# Patient Record
Sex: Female | Born: 1955 | Race: White | Hispanic: No | Marital: Married | Smoking: Former smoker
Health system: Southern US, Community
[De-identification: ages and names within clinical notes are randomized; demographics above are authoritative.]

## PROBLEM LIST (undated history)

## (undated) DIAGNOSIS — M810 Age-related osteoporosis without current pathological fracture: Secondary | ICD-10-CM

## (undated) DIAGNOSIS — M1990 Unspecified osteoarthritis, unspecified site: Secondary | ICD-10-CM

## (undated) DIAGNOSIS — J449 Chronic obstructive pulmonary disease, unspecified: Secondary | ICD-10-CM

## (undated) DIAGNOSIS — E785 Hyperlipidemia, unspecified: Secondary | ICD-10-CM

## (undated) DIAGNOSIS — F17200 Nicotine dependence, unspecified, uncomplicated: Secondary | ICD-10-CM

## (undated) DIAGNOSIS — I10 Essential (primary) hypertension: Secondary | ICD-10-CM

## (undated) DIAGNOSIS — K635 Polyp of colon: Secondary | ICD-10-CM

## (undated) DIAGNOSIS — E538 Deficiency of other specified B group vitamins: Secondary | ICD-10-CM

## (undated) DIAGNOSIS — T7840XA Allergy, unspecified, initial encounter: Secondary | ICD-10-CM

## (undated) DIAGNOSIS — Z8489 Family history of other specified conditions: Secondary | ICD-10-CM

## (undated) DIAGNOSIS — J189 Pneumonia, unspecified organism: Secondary | ICD-10-CM

## (undated) DIAGNOSIS — H269 Unspecified cataract: Secondary | ICD-10-CM

## (undated) DIAGNOSIS — I499 Cardiac arrhythmia, unspecified: Secondary | ICD-10-CM

## (undated) HISTORY — DX: Age-related osteoporosis without current pathological fracture: M81.0

## (undated) HISTORY — DX: Deficiency of other specified B group vitamins: E53.8

## (undated) HISTORY — DX: Nicotine dependence, unspecified, uncomplicated: F17.200

## (undated) HISTORY — DX: Unspecified cataract: H26.9

## (undated) HISTORY — PX: JOINT REPLACEMENT: SHX530

## (undated) HISTORY — DX: Chronic obstructive pulmonary disease, unspecified: J44.9

## (undated) HISTORY — DX: Allergy, unspecified, initial encounter: T78.40XA

## (undated) HISTORY — PX: COLONOSCOPY: SHX174

## (undated) HISTORY — PX: CARDIAC CATHETERIZATION: SHX172

## (undated) HISTORY — DX: Hyperlipidemia, unspecified: E78.5

## (undated) HISTORY — DX: Polyp of colon: K63.5

## (undated) HISTORY — PX: POLYPECTOMY: SHX149

## (undated) HISTORY — PX: EYE SURGERY: SHX253

## (undated) HISTORY — DX: Essential (primary) hypertension: I10

---

## 1998-02-16 ENCOUNTER — Ambulatory Visit (HOSPITAL_COMMUNITY): Admission: RE | Admit: 1998-02-16 | Discharge: 1998-02-16 | Payer: Self-pay | Admitting: *Deleted

## 1998-02-18 ENCOUNTER — Ambulatory Visit (HOSPITAL_COMMUNITY): Admission: RE | Admit: 1998-02-18 | Discharge: 1998-02-18 | Payer: Self-pay | Admitting: *Deleted

## 1998-02-18 ENCOUNTER — Encounter: Payer: Self-pay | Admitting: *Deleted

## 1999-02-21 ENCOUNTER — Ambulatory Visit (HOSPITAL_COMMUNITY): Admission: RE | Admit: 1999-02-21 | Discharge: 1999-02-21 | Payer: Self-pay | Admitting: *Deleted

## 1999-02-21 ENCOUNTER — Encounter: Payer: Self-pay | Admitting: *Deleted

## 2000-06-04 ENCOUNTER — Ambulatory Visit (HOSPITAL_COMMUNITY): Admission: RE | Admit: 2000-06-04 | Discharge: 2000-06-04 | Payer: Self-pay | Admitting: *Deleted

## 2000-06-04 ENCOUNTER — Encounter: Payer: Self-pay | Admitting: *Deleted

## 2001-03-27 ENCOUNTER — Observation Stay (HOSPITAL_COMMUNITY): Admission: AD | Admit: 2001-03-27 | Discharge: 2001-03-28 | Payer: Self-pay | Admitting: Cardiology

## 2001-09-04 ENCOUNTER — Ambulatory Visit (HOSPITAL_COMMUNITY): Admission: RE | Admit: 2001-09-04 | Discharge: 2001-09-04 | Payer: Self-pay | Admitting: *Deleted

## 2001-09-04 ENCOUNTER — Encounter: Payer: Self-pay | Admitting: *Deleted

## 2002-09-07 ENCOUNTER — Encounter: Payer: Self-pay | Admitting: *Deleted

## 2002-09-07 ENCOUNTER — Ambulatory Visit (HOSPITAL_COMMUNITY): Admission: RE | Admit: 2002-09-07 | Discharge: 2002-09-07 | Payer: Self-pay | Admitting: *Deleted

## 2003-10-12 ENCOUNTER — Ambulatory Visit (HOSPITAL_COMMUNITY): Admission: RE | Admit: 2003-10-12 | Discharge: 2003-10-12 | Payer: Self-pay | Admitting: Obstetrics and Gynecology

## 2004-09-04 ENCOUNTER — Ambulatory Visit: Payer: Self-pay | Admitting: Internal Medicine

## 2004-10-06 ENCOUNTER — Ambulatory Visit: Payer: Self-pay | Admitting: Internal Medicine

## 2004-10-12 ENCOUNTER — Ambulatory Visit (HOSPITAL_COMMUNITY): Admission: RE | Admit: 2004-10-12 | Discharge: 2004-10-12 | Payer: Self-pay | Admitting: Obstetrics and Gynecology

## 2004-10-18 ENCOUNTER — Ambulatory Visit: Payer: Self-pay | Admitting: Internal Medicine

## 2005-02-08 ENCOUNTER — Ambulatory Visit: Payer: Self-pay | Admitting: Internal Medicine

## 2005-03-20 ENCOUNTER — Ambulatory Visit: Payer: Self-pay | Admitting: Internal Medicine

## 2005-03-27 ENCOUNTER — Ambulatory Visit: Payer: Self-pay | Admitting: Internal Medicine

## 2006-04-23 ENCOUNTER — Ambulatory Visit: Payer: Self-pay | Admitting: Internal Medicine

## 2006-08-22 ENCOUNTER — Ambulatory Visit: Payer: Self-pay | Admitting: Internal Medicine

## 2006-08-22 LAB — CONVERTED CEMR LAB
BUN: 8 mg/dL (ref 6–23)
Basophils Absolute: 0.2 10*3/uL — ABNORMAL HIGH (ref 0.0–0.1)
Basophils Relative: 1.4 % — ABNORMAL HIGH (ref 0.0–1.0)
Bilirubin Urine: NEGATIVE
CO2: 31 meq/L (ref 19–32)
Calcium: 9.4 mg/dL (ref 8.4–10.5)
Chloride: 103 meq/L (ref 96–112)
Creatinine, Ser: 0.8 mg/dL (ref 0.4–1.2)
Crystals: NEGATIVE
Eosinophils Absolute: 0.2 10*3/uL (ref 0.0–0.6)
Eosinophils Relative: 2.1 % (ref 0.0–5.0)
GFR calc Af Amer: 98 mL/min
GFR calc non Af Amer: 81 mL/min
Glucose, Bld: 104 mg/dL — ABNORMAL HIGH (ref 70–99)
HCT: 44.8 % (ref 36.0–46.0)
Hemoglobin: 15.4 g/dL — ABNORMAL HIGH (ref 12.0–15.0)
Ketones, ur: NEGATIVE mg/dL
Leukocytes, UA: NEGATIVE
Lymphocytes Relative: 36.7 % (ref 12.0–46.0)
MCHC: 34.4 g/dL (ref 30.0–36.0)
MCV: 93.8 fL (ref 78.0–100.0)
Monocytes Absolute: 0.6 10*3/uL (ref 0.2–0.7)
Monocytes Relative: 5.1 % (ref 3.0–11.0)
Mucus, UA: NEGATIVE
Neutro Abs: 6.5 10*3/uL (ref 1.4–7.7)
Neutrophils Relative %: 54.7 % (ref 43.0–77.0)
Nitrite: NEGATIVE
Platelets: 308 10*3/uL (ref 150–400)
Potassium: 3.9 meq/L (ref 3.5–5.1)
RBC: 4.77 M/uL (ref 3.87–5.11)
RDW: 12.6 % (ref 11.5–14.6)
Sodium: 139 meq/L (ref 135–145)
Specific Gravity, Urine: <=1.005 (ref 1.000–1.03)
Total Protein, Urine: NEGATIVE mg/dL
Urine Glucose: NEGATIVE mg/dL
Urobilinogen, UA: 0.2 (ref 0.0–1.0)
WBC: 11.9 10*3/uL — ABNORMAL HIGH (ref 4.5–10.5)
pH: 6 (ref 5.0–8.0)

## 2006-08-23 ENCOUNTER — Ambulatory Visit: Payer: Self-pay | Admitting: Internal Medicine

## 2006-09-30 ENCOUNTER — Ambulatory Visit: Payer: Self-pay | Admitting: Gastroenterology

## 2006-10-14 ENCOUNTER — Ambulatory Visit: Payer: Self-pay | Admitting: Gastroenterology

## 2006-10-14 ENCOUNTER — Encounter (INDEPENDENT_AMBULATORY_CARE_PROVIDER_SITE_OTHER): Payer: Self-pay | Admitting: Specialist

## 2007-04-19 ENCOUNTER — Encounter: Payer: Self-pay | Admitting: Internal Medicine

## 2007-04-19 DIAGNOSIS — J309 Allergic rhinitis, unspecified: Secondary | ICD-10-CM | POA: Insufficient documentation

## 2007-04-19 DIAGNOSIS — Z860100 Personal history of colon polyps, unspecified: Secondary | ICD-10-CM | POA: Insufficient documentation

## 2007-04-21 ENCOUNTER — Encounter: Payer: Self-pay | Admitting: Internal Medicine

## 2007-04-21 ENCOUNTER — Ambulatory Visit: Payer: Self-pay | Admitting: Internal Medicine

## 2007-04-21 DIAGNOSIS — I10 Essential (primary) hypertension: Secondary | ICD-10-CM | POA: Insufficient documentation

## 2007-04-21 DIAGNOSIS — N924 Excessive bleeding in the premenopausal period: Secondary | ICD-10-CM | POA: Insufficient documentation

## 2007-04-21 DIAGNOSIS — F17200 Nicotine dependence, unspecified, uncomplicated: Secondary | ICD-10-CM | POA: Insufficient documentation

## 2007-04-22 LAB — CONVERTED CEMR LAB
ALT: 20 units/L (ref 0–35)
AST: 18 units/L (ref 0–37)
Albumin: 3.6 g/dL (ref 3.5–5.2)
Alkaline Phosphatase: 77 units/L (ref 39–117)
BUN: 9 mg/dL (ref 6–23)
Basophils Absolute: 0.1 10*3/uL (ref 0.0–0.1)
Basophils Relative: 0.7 % (ref 0.0–1.0)
Bilirubin Urine: NEGATIVE
Bilirubin, Direct: 0.2 mg/dL (ref 0.0–0.3)
CO2: 31 meq/L (ref 19–32)
Calcium: 9 mg/dL (ref 8.4–10.5)
Chloride: 112 meq/L (ref 96–112)
Cholesterol: 209 mg/dL (ref 0–200)
Creatinine, Ser: 0.8 mg/dL (ref 0.4–1.2)
Crystals: NEGATIVE
Direct LDL: 154 mg/dL
Eosinophils Absolute: 0.3 10*3/uL (ref 0.0–0.6)
Eosinophils Relative: 3.5 % (ref 0.0–5.0)
GFR calc Af Amer: 97 mL/min
GFR calc non Af Amer: 80 mL/min
Glucose, Bld: 87 mg/dL (ref 70–99)
HCT: 42.4 % (ref 36.0–46.0)
HDL: 38.4 mg/dL — ABNORMAL LOW (ref >39.0)
Hemoglobin: 14.3 g/dL (ref 12.0–15.0)
Ketones, ur: NEGATIVE mg/dL
Lymphocytes Relative: 32.5 % (ref 12.0–46.0)
MCHC: 33.7 g/dL (ref 30.0–36.0)
MCV: 96.2 fL (ref 78.0–100.0)
Monocytes Absolute: 0.5 10*3/uL (ref 0.2–0.7)
Monocytes Relative: 5.8 % (ref 3.0–11.0)
Neutro Abs: 4.8 10*3/uL (ref 1.4–7.7)
Neutrophils Relative %: 57.5 % (ref 43.0–77.0)
Nitrite: NEGATIVE
Platelets: 215 10*3/uL (ref 150–400)
Potassium: 4.3 meq/L (ref 3.5–5.1)
RBC: 4.41 M/uL (ref 3.87–5.11)
RDW: 12.6 % (ref 11.5–14.6)
Sodium: 149 meq/L — ABNORMAL HIGH (ref 135–145)
Specific Gravity, Urine: 1.015 (ref 1.000–1.03)
TSH: 0.74 microintl units/mL (ref 0.35–5.50)
Total Bilirubin: 0.8 mg/dL (ref 0.3–1.2)
Total CHOL/HDL Ratio: 5.4
Total Protein, Urine: NEGATIVE mg/dL
Total Protein: 6.3 g/dL (ref 6.0–8.3)
Triglycerides: 137 mg/dL (ref 0–149)
Urine Glucose: NEGATIVE mg/dL
Urobilinogen, UA: 0.2 (ref 0.0–1.0)
VLDL: 27 mg/dL (ref 0–40)
WBC: 8.5 10*3/uL (ref 4.5–10.5)
pH: 6.5 (ref 5.0–8.0)

## 2007-05-07 ENCOUNTER — Ambulatory Visit: Payer: Self-pay | Admitting: Internal Medicine

## 2007-05-07 LAB — CONVERTED CEMR LAB
Calcium: 8.8 mg/dL (ref 8.4–10.5)
GFR calc Af Amer: 97 mL/min
GFR calc non Af Amer: 80 mL/min
Glucose, Bld: 82 mg/dL (ref 70–99)
Potassium: 3.9 meq/L (ref 3.5–5.1)

## 2007-05-30 ENCOUNTER — Encounter: Payer: Self-pay | Admitting: Internal Medicine

## 2007-07-10 ENCOUNTER — Ambulatory Visit: Payer: Self-pay | Admitting: Internal Medicine

## 2007-08-19 ENCOUNTER — Telehealth: Payer: Self-pay | Admitting: Internal Medicine

## 2007-10-31 ENCOUNTER — Ambulatory Visit: Payer: Self-pay | Admitting: Internal Medicine

## 2007-10-31 DIAGNOSIS — J019 Acute sinusitis, unspecified: Secondary | ICD-10-CM | POA: Insufficient documentation

## 2007-10-31 DIAGNOSIS — M25559 Pain in unspecified hip: Secondary | ICD-10-CM | POA: Insufficient documentation

## 2008-01-05 ENCOUNTER — Ambulatory Visit: Payer: Self-pay | Admitting: Internal Medicine

## 2008-01-06 LAB — CONVERTED CEMR LAB
ALT: 19 units/L (ref 0–35)
AST: 15 units/L (ref 0–37)
Basophils Relative: 0.3 % (ref 0.0–1.0)
CO2: 28 meq/L (ref 19–32)
Calcium: 9.2 mg/dL (ref 8.4–10.5)
Chloride: 109 meq/L (ref 96–112)
Creatinine, Ser: 1 mg/dL (ref 0.4–1.2)
Direct LDL: 164.6 mg/dL
Eosinophils Absolute: 0.2 10*3/uL (ref 0.0–0.7)
Eosinophils Relative: 2.3 % (ref 0.0–5.0)
Glucose, Bld: 107 mg/dL — ABNORMAL HIGH (ref 70–99)
HCT: 42.6 % (ref 36.0–46.0)
HDL: 37.6 mg/dL — ABNORMAL LOW (ref >39.0)
Hemoglobin: 14.3 g/dL (ref 12.0–15.0)
MCV: 96.7 fL (ref 78.0–100.0)
Monocytes Absolute: 0.4 10*3/uL (ref 0.1–1.0)
Monocytes Relative: 5.4 % (ref 3.0–12.0)
Neutro Abs: 5 10*3/uL (ref 1.4–7.7)
Nitrite: NEGATIVE
RBC: 4.4 M/uL (ref 3.87–5.11)
Specific Gravity, Urine: 1.015 (ref 1.000–1.03)
TSH: 0.4 microintl units/mL (ref 0.35–5.50)
Total Bilirubin: 1.3 mg/dL — ABNORMAL HIGH (ref 0.3–1.2)
Total Protein, Urine: NEGATIVE mg/dL
Total Protein: 6.3 g/dL (ref 6.0–8.3)
Urine Glucose: NEGATIVE mg/dL
WBC: 8.2 10*3/uL (ref 4.5–10.5)
pH: 6.5 (ref 5.0–8.0)

## 2008-01-08 ENCOUNTER — Ambulatory Visit: Payer: Self-pay | Admitting: Internal Medicine

## 2008-01-08 DIAGNOSIS — M79609 Pain in unspecified limb: Secondary | ICD-10-CM | POA: Insufficient documentation

## 2008-01-08 DIAGNOSIS — R21 Rash and other nonspecific skin eruption: Secondary | ICD-10-CM | POA: Insufficient documentation

## 2008-01-12 ENCOUNTER — Encounter (INDEPENDENT_AMBULATORY_CARE_PROVIDER_SITE_OTHER): Payer: Self-pay | Admitting: *Deleted

## 2008-04-02 ENCOUNTER — Ambulatory Visit: Payer: Self-pay | Admitting: Internal Medicine

## 2008-04-09 ENCOUNTER — Ambulatory Visit: Payer: Self-pay | Admitting: Internal Medicine

## 2008-04-09 DIAGNOSIS — E538 Deficiency of other specified B group vitamins: Secondary | ICD-10-CM | POA: Insufficient documentation

## 2008-06-22 ENCOUNTER — Telehealth: Payer: Self-pay | Admitting: Internal Medicine

## 2008-06-23 ENCOUNTER — Ambulatory Visit: Payer: Self-pay | Admitting: Internal Medicine

## 2008-06-23 DIAGNOSIS — K219 Gastro-esophageal reflux disease without esophagitis: Secondary | ICD-10-CM | POA: Insufficient documentation

## 2008-06-23 DIAGNOSIS — I498 Other specified cardiac arrhythmias: Secondary | ICD-10-CM | POA: Insufficient documentation

## 2008-06-23 DIAGNOSIS — R55 Syncope and collapse: Secondary | ICD-10-CM | POA: Insufficient documentation

## 2008-06-24 ENCOUNTER — Ambulatory Visit: Payer: Self-pay | Admitting: Cardiovascular Disease

## 2008-06-24 DIAGNOSIS — R079 Chest pain, unspecified: Secondary | ICD-10-CM | POA: Insufficient documentation

## 2008-06-24 LAB — CONVERTED CEMR LAB
BUN: 12 mg/dL (ref 6–23)
Basophils Absolute: 0 10*3/uL (ref 0.0–0.1)
Basophils Relative: 0 % (ref 0.0–3.0)
CK-MB: 1.9 ng/mL (ref 0.3–4.0)
Calcium: 9.1 mg/dL (ref 8.4–10.5)
Creatinine, Ser: 0.9 mg/dL (ref 0.4–1.2)
Eosinophils Absolute: 0.3 10*3/uL (ref 0.0–0.7)
Eosinophils Relative: 3.2 % (ref 0.0–5.0)
GFR calc Af Amer: 85 mL/min
GFR calc non Af Amer: 70 mL/min
HCT: 40.2 % (ref 36.0–46.0)
MCHC: 34.9 g/dL (ref 30.0–36.0)
MCV: 95.7 fL (ref 78.0–100.0)
Monocytes Absolute: 0.6 10*3/uL (ref 0.1–1.0)
Neutrophils Relative %: 62.2 % (ref 43.0–77.0)
RBC: 4.2 M/uL (ref 3.87–5.11)
WBC: 10.2 10*3/uL (ref 4.5–10.5)

## 2008-07-06 ENCOUNTER — Ambulatory Visit: Payer: Self-pay | Admitting: Cardiovascular Disease

## 2008-07-06 ENCOUNTER — Ambulatory Visit: Payer: Self-pay

## 2008-07-06 ENCOUNTER — Encounter: Payer: Self-pay | Admitting: Cardiovascular Disease

## 2008-08-11 ENCOUNTER — Telehealth: Payer: Self-pay | Admitting: Internal Medicine

## 2008-08-16 ENCOUNTER — Ambulatory Visit: Payer: Self-pay | Admitting: Pulmonary Disease

## 2008-08-16 DIAGNOSIS — R599 Enlarged lymph nodes, unspecified: Secondary | ICD-10-CM | POA: Insufficient documentation

## 2008-08-16 DIAGNOSIS — R930 Abnormal findings on diagnostic imaging of skull and head, not elsewhere classified: Secondary | ICD-10-CM | POA: Insufficient documentation

## 2008-12-22 ENCOUNTER — Encounter: Payer: Self-pay | Admitting: Pulmonary Disease

## 2008-12-30 ENCOUNTER — Ambulatory Visit: Payer: Self-pay | Admitting: Internal Medicine

## 2009-01-11 ENCOUNTER — Encounter: Payer: Self-pay | Admitting: Pulmonary Disease

## 2009-07-22 ENCOUNTER — Ambulatory Visit: Payer: Self-pay | Admitting: Internal Medicine

## 2009-07-22 ENCOUNTER — Telehealth (INDEPENDENT_AMBULATORY_CARE_PROVIDER_SITE_OTHER): Payer: Self-pay | Admitting: *Deleted

## 2009-07-22 DIAGNOSIS — J4489 Other specified chronic obstructive pulmonary disease: Secondary | ICD-10-CM | POA: Insufficient documentation

## 2009-07-22 DIAGNOSIS — J449 Chronic obstructive pulmonary disease, unspecified: Secondary | ICD-10-CM | POA: Insufficient documentation

## 2009-07-22 DIAGNOSIS — J209 Acute bronchitis, unspecified: Secondary | ICD-10-CM | POA: Insufficient documentation

## 2009-07-27 ENCOUNTER — Telehealth: Payer: Self-pay | Admitting: Internal Medicine

## 2009-08-08 ENCOUNTER — Telehealth: Payer: Self-pay | Admitting: Cardiovascular Disease

## 2009-08-10 ENCOUNTER — Telehealth: Payer: Self-pay | Admitting: Internal Medicine

## 2009-08-17 ENCOUNTER — Telehealth: Payer: Self-pay | Admitting: Internal Medicine

## 2009-08-30 ENCOUNTER — Ambulatory Visit: Payer: Self-pay | Admitting: Internal Medicine

## 2009-08-30 LAB — CONVERTED CEMR LAB
BUN: 16 mg/dL (ref 6–23)
Calcium: 9.1 mg/dL (ref 8.4–10.5)
Creatinine, Ser: 1.1 mg/dL (ref 0.4–1.2)
GFR calc non Af Amer: 55.13 mL/min (ref >60)
Glucose, Bld: 91 mg/dL (ref 70–99)

## 2009-09-02 ENCOUNTER — Ambulatory Visit: Payer: Self-pay | Admitting: Internal Medicine

## 2010-03-16 ENCOUNTER — Ambulatory Visit: Payer: Self-pay | Admitting: Internal Medicine

## 2010-03-16 DIAGNOSIS — R252 Cramp and spasm: Secondary | ICD-10-CM | POA: Insufficient documentation

## 2010-07-18 ENCOUNTER — Ambulatory Visit
Admission: RE | Admit: 2010-07-18 | Discharge: 2010-07-18 | Payer: Self-pay | Source: Home / Self Care | Attending: Internal Medicine | Admitting: Internal Medicine

## 2010-07-18 ENCOUNTER — Other Ambulatory Visit: Payer: Self-pay | Admitting: Internal Medicine

## 2010-07-18 LAB — LIPID PANEL
Cholesterol: 196 mg/dL (ref 0–200)
HDL: 40.1 mg/dL (ref >39.00)
LDL Cholesterol: 143 mg/dL — ABNORMAL HIGH (ref 0–99)
Total CHOL/HDL Ratio: 5
Triglycerides: 65 mg/dL (ref 0.0–149.0)
VLDL: 13 mg/dL (ref 0.0–40.0)

## 2010-07-18 LAB — CBC WITH DIFFERENTIAL/PLATELET
Basophils Absolute: 0 10*3/uL (ref 0.0–0.1)
Basophils Relative: 0.3 % (ref 0.0–3.0)
Eosinophils Absolute: 0.3 10*3/uL (ref 0.0–0.7)
Eosinophils Relative: 2.7 % (ref 0.0–5.0)
HCT: 39.3 % (ref 36.0–46.0)
Hemoglobin: 13.5 g/dL (ref 12.0–15.0)
Lymphocytes Relative: 24.9 % (ref 12.0–46.0)
Lymphs Abs: 2.5 10*3/uL (ref 0.7–4.0)
MCHC: 34.4 g/dL (ref 30.0–36.0)
MCV: 96.7 fl (ref 78.0–100.0)
Monocytes Absolute: 0.6 10*3/uL (ref 0.1–1.0)
Monocytes Relative: 5.9 % (ref 3.0–12.0)
Neutro Abs: 6.7 10*3/uL (ref 1.4–7.7)
Neutrophils Relative %: 66.2 % (ref 43.0–77.0)
Platelets: 205 10*3/uL (ref 150.0–400.0)
RBC: 4.06 Mil/uL (ref 3.87–5.11)
RDW: 13.5 % (ref 11.5–14.6)
WBC: 10.1 10*3/uL (ref 4.5–10.5)

## 2010-07-18 LAB — HEPATIC FUNCTION PANEL
ALT: 15 U/L (ref 0–35)
AST: 14 U/L (ref 0–37)
Albumin: 3.7 g/dL (ref 3.5–5.2)
Alkaline Phosphatase: 74 U/L (ref 39–117)
Bilirubin, Direct: 0.1 mg/dL (ref 0.0–0.3)
Total Bilirubin: 1.3 mg/dL — ABNORMAL HIGH (ref 0.3–1.2)
Total Protein: 6.3 g/dL (ref 6.0–8.3)

## 2010-07-18 LAB — URINALYSIS
Bilirubin Urine: NEGATIVE
Ketones, ur: NEGATIVE
Leukocytes, UA: NEGATIVE
Nitrite: NEGATIVE
Specific Gravity, Urine: >=1.03 (ref 1.000–1.030)
Total Protein, Urine: NEGATIVE
Urine Glucose: NEGATIVE
Urobilinogen, UA: 0.2 (ref 0.0–1.0)
pH: 6 (ref 5.0–8.0)

## 2010-07-18 LAB — BASIC METABOLIC PANEL
BUN: 12 mg/dL (ref 6–23)
CO2: 27 mEq/L (ref 19–32)
Calcium: 9 mg/dL (ref 8.4–10.5)
Chloride: 107 mEq/L (ref 96–112)
Creatinine, Ser: 0.9 mg/dL (ref 0.4–1.2)
GFR: 69.26 mL/min (ref >60.00)
Glucose, Bld: 91 mg/dL (ref 70–99)
Potassium: 4.3 mEq/L (ref 3.5–5.1)
Sodium: 141 mEq/L (ref 135–145)

## 2010-07-18 LAB — VITAMIN B12: Vitamin B-12: >1500 pg/mL — ABNORMAL HIGH (ref 211–911)

## 2010-07-18 LAB — TSH: TSH: 0.67 u[IU]/mL (ref 0.35–5.50)

## 2010-07-21 ENCOUNTER — Ambulatory Visit
Admission: RE | Admit: 2010-07-21 | Discharge: 2010-07-21 | Payer: Self-pay | Source: Home / Self Care | Attending: Internal Medicine | Admitting: Internal Medicine

## 2010-07-21 ENCOUNTER — Encounter: Payer: Self-pay | Admitting: Internal Medicine

## 2010-07-21 DIAGNOSIS — H6120 Impacted cerumen, unspecified ear: Secondary | ICD-10-CM | POA: Insufficient documentation

## 2010-08-01 NOTE — Assessment & Plan Note (Signed)
Summary: see emr note/work in ok per plot/cd   Vital Signs:  Patient profile:   55 year old female Height:      70 inches Weight:      208 pounds BMI:     29.95 Temp:     97.3 degrees F oral Pulse rate:   50 / minute BP sitting:   156 / 84  (left arm)  Vitals Entered By: Tora Perches (July 22, 2009 4:39 PM) CC: pt c/o of elevated bp Is Patient Diabetic? No   Primary Care Provider:  Dr Posey Rea  CC:  pt c/o of elevated bp.  History of Present Illness: Patient left message on triage that she has been sick and having trouble with incresed BP. Per the patient, she has taken Coricidin, but her BP is high anyway. The patient presents with complaints of sore throat, fever, cough, sinus congestion and drainge of 1 wk duration. Not better with OTC meds. Chest hurts with coughing. Can't sleep due to cough. Muscle aches are present.  The mucus is colored. C/o wheezing C/o R elbow pain, occasional R hand numbness   Preventive Screening-Counseling & Management  Alcohol-Tobacco     Smoking Status: current     Packs/Day: 0.5  Current Medications (verified): 1)  Prometrium 200 Mg  Caps (Progesterone Micronized) .... Take One Tablet Daily 2)  Estrace 1 Mg  Tabs (Estradiol) .... Take 1 Tablet By Mouth Once A Day 3)  Alprazolam 0.25 Mg Tabs (Alprazolam) .... Take 1 Tablet By Mouth Twice A Day As Needed Anxiety 4)  Verapamil Hcl Cr 240 Mg Xr24h-Cap (Verapamil Hcl) .... Take 1 Tablet By Mouth Once A Day 5)  Aspir-Low 81 Mg Tbec (Aspirin) .Marland Kitchen.. 1 Once Daily Pc 6)  Vitamin D3 1000 Unit  Tabs (Cholecalciferol) .... Take 1 Tablet By Mouth Once A Day 7)  Vitamin B-12 Cr 1000 Mcg  Tbcr (Cyanocobalamin) .... Take One Tablet By Mouth Daily 8)  Fish Oil  Oil (Fish Oil) .... Once Daily  Allergies: 1)  ! 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  Smokes 1/2 -1 ppd x 26 years. Married:  husband  currently with anxiety disorder Museum/gallery conservator (son in Social worker in jail for killing grand son)  Past Medical History: Allergic rhinitis Colonic polyps, hx of Smoker Hypertension Vit B12 low GERD Chest Pain COPD  Social History: Packs/Day:  0.5  Review of Systems       The patient complains of fever, hoarseness, dyspnea on exertion, and prolonged cough.  The patient denies chest pain and abdominal pain.    Physical Exam  General:  ow female in nad Ears:  External ear exam shows no significant lesions or deformities. Wax on L Mouth:  Erythematous throat mucosa and intranasal erythema.  Lungs:  clear to auscultation Heart:  rrr, with occasional extrasystole Abdomen:  soft and nontender, bs+ Msk:  No deformity or scoliosis noted of thoracic or lumbar spine.  R elbow NT Neurologic:  No cranial nerve deficits noted. Station and gait are normal. Plantar reflexes are down-going bilaterally. DTRs are symmetrical throughout. Sensory, motor and coordinative functions appear intact. Skin:  Intact without suspicious lesions or rashes Psych:  Cognition and judgment appear intact. Alert and cooperative with normal attention span and concentration. No apparent delusions, illusions, hallucinations   Impression & Recommendations:  Problem # 1:  BRONCHITIS, ACUTE (ICD-466.0) Assessment New  Her updated medication list for this problem includes:  Ventolin Hfa 108 (90 Base) Mcg/act Aers (Albuterol sulfate) .Marland Kitchen... 2 inh up to qid as needed wheezing or shortness of breath    Doxycycline Monohydrate 100 Mg Caps (Doxycycline monohydrate) .Marland Kitchen... 1 by mouth two times a day with a glass of water    Promethazine-codeine 6.25-10 Mg/29ml Syrp (Promethazine-codeine) .Marland Kitchen... 5-10 ml by mouth q id as needed cough  Problem # 2:  COPD (ICD-496) Assessment: Deteriorated  Her updated medication list for this problem includes:    Ventolin Hfa 108 (90 Base) Mcg/act Aers (Albuterol sulfate) .Marland Kitchen... 2 inh up to  qid as needed wheezing or shortness of breath  Problem # 3:  ARM PAIN (ICD-729.5) R elbow Pennsaid  Problem # 4:  HYPERTENSION (ICD-401.9) Assessment: Deteriorated  Her updated medication list for this problem includes:    Verapamil Hcl Cr 240 Mg Xr24h-cap (Verapamil hcl) .Marland Kitchen... Take 1 tablet by mouth once a day    Aldactazide 25-25 Mg Tabs (Spironolactone-hctz) .Marland Kitchen... 1 by mouth qam for blood pressure  Complete Medication List: 1)  Prometrium 200 Mg Caps (Progesterone micronized) .... Take one tablet daily 2)  Estrace 1 Mg Tabs (Estradiol) .... Take 1 tablet by mouth once a day 3)  Alprazolam 0.25 Mg Tabs (Alprazolam) .... Take 1 tablet by mouth twice a day as needed anxiety 4)  Verapamil Hcl Cr 240 Mg Xr24h-cap (Verapamil hcl) .... Take 1 tablet by mouth once a day 5)  Aspir-low 81 Mg Tbec (Aspirin) .Marland Kitchen.. 1 once daily pc 6)  Vitamin D3 1000 Unit Tabs (Cholecalciferol) .... Take 1 tablet by mouth once a day 7)  Vitamin B-12 Cr 1000 Mcg Tbcr (Cyanocobalamin) .... Take one tablet by mouth daily 8)  Fish Oil Oil (Fish oil) .... Once daily 9)  Aldactazide 25-25 Mg Tabs (Spironolactone-hctz) .Marland Kitchen.. 1 by mouth qam for blood pressure 10)  Ventolin Hfa 108 (90 Base) Mcg/act Aers (Albuterol sulfate) .... 2 inh up to qid as needed wheezing or shortness of breath 11)  Doxycycline Monohydrate 100 Mg Caps (Doxycycline monohydrate) .Marland Kitchen.. 1 by mouth two times a day with a glass of water 12)  Prednisone 10 Mg Tabs (Prednisone) .... Take 40mg  qd for 3 days, then 20 mg qd for 3 days, then 10mg  qd for 6 days, then stop. take pc. 13)  Promethazine-codeine 6.25-10 Mg/44ml Syrp (Promethazine-codeine) .... 5-10 ml by mouth q id as needed cough 14)  Pennsaid 1.5 % Soln (Diclofenac sodium) .... 3-5 gtt on skin three times a day for pain  Patient Instructions: 1)  Call if you are not better in a reasonable amount of time or if worse. Go to ER if feeling really bad!  2)  Please schedule a follow-up appointment in 2  months. 3)  BMP prior to visit, ICD-9: 4)  Hepatic Panel prior to visit, ICD-9: 5)  Lipid Panel prior to visit, ICD-9: 6)  TSH prior to visit, ICD-9:401.1 995.20 7)  CBC w/ Diff prior to visit, ICD-9: 8)  HbgA1C prior to visit, ICD-9: 9)  B12 Prescriptions: PENNSAID 1.5 % SOLN (DICLOFENAC SODIUM) 3-5 gtt on skin three times a day for pain  #1 x 3   Entered and Authorized by:   Tresa Garter MD   Signed by:   Tresa Garter MD on 07/22/2009   Method used:   Print then Give to Patient   RxID:   [id number] ALPRAZOLAM 0.25 MG TABS (ALPRAZOLAM) Take 1 tablet by mouth twice a day as needed anxiety  #60 x 2   Entered  and Authorized by:   Tresa Garter MD   Signed by:   Tresa Garter MD on 07/22/2009   Method used:   Print then Give to Patient   RxID:   [id number] PROMETHAZINE-CODEINE 6.25-10 MG/5ML SYRP (PROMETHAZINE-CODEINE) 5-10 ml by mouth q id as needed cough  #300 ml x 0   Entered and Authorized by:   Tresa Garter MD   Signed by:   Tresa Garter MD on 07/22/2009   Method used:   Print then Give to Patient   RxID:   [id number] PREDNISONE 10 MG TABS (PREDNISONE) Take 40mg  qd for 3 days, then 20 mg qd for 3 days, then 10mg  qd for 6 days, then stop. Take pc.  #24 x 1   Entered and Authorized by:   Tresa Garter MD   Signed by:   Tresa Garter MD on 07/22/2009   Method used:   Print then Give to Patient   RxID:   [id number] DOXYCYCLINE MONOHYDRATE 100 MG CAPS (DOXYCYCLINE MONOHYDRATE) 1 by mouth two times a day with a glass of water  #20 x 0   Entered and Authorized by:   Tresa Garter MD   Signed by:   Tresa Garter MD on 07/22/2009   Method used:   Electronically to        [facility]  [address] (retail)       [address]       [place], [place]  [address]       Ph: [phone]       Fax: [phone]   RxID:   [id number] VENTOLIN HFA 108 (90 BASE) MCG/ACT AERS (ALBUTEROL  SULFATE) 2 inh up to qid as needed wheezing or shortness of breath  #3 x 3   Entered and Authorized by:   Tresa Garter MD   Signed by:   Tresa Garter MD on 07/22/2009   Method used:   Print then Give to Patient   RxID:   [id number] ALDACTAZIDE 25-25 MG TABS (SPIRONOLACTONE-HCTZ) 1 by mouth qam for blood pressure  #90 x 3   Entered and Authorized by:   Tresa Garter MD   Signed by:   Tresa Garter MD on 07/22/2009   Method used:   Print then Give to Patient   RxID:   [id number]      Immunization History:  Pneumovax Immunization History:    Pneumovax:  pneumovax (04/13/2009)

## 2010-08-01 NOTE — Progress Notes (Signed)
Summary: Med question  Phone Note Call from Patient Call back at Work Phone [phone]   Summary of Call: Patient called in regards to Sprionolactone. She has been notified not to take Aleve when on this medication and the patient takes Aleve daily for arthritic pain. Per the patient nothing provides as much relief as the Aleve. Please advise if patient should stop the Aleve or if you have other recommendations. Initial call taken by: Lucious Groves,  August 10, 2009 10:41 AM  Follow-up for Phone Call        1 a day Aleve is OK Follow-up by: Tresa Garter MD,  August 10, 2009 4:38 PM  Additional Follow-up for Phone Call Additional follow up Details #1::        Left message on voicemail to call back to office. Additional Follow-up by: Lucious Groves,  August 10, 2009 4:45 PM    Additional Follow-up for Phone Call Additional follow up Details #2::    Pt informed  Follow-up by: Lamar Sprinkles, CMA,  August 11, 2009 10:21 AM

## 2010-08-01 NOTE — Progress Notes (Signed)
Summary: refill verapamil   Phone Note Refill Request Call back at Home Phone [phone] Message from:  Patient  Refills Requested: Medication #1:  VERAPAMIL HCL CR 240 MG XR24H-CAP Take 1 tablet by mouth once a day   Supply Requested: 3 months needs #90 with refills.  Fax to [phone].  Member ID # [id number]   Method Requested: Electronic Initial call taken by: Burnard Leigh,  August 08, 2009 4:10 PM    Prescriptions: VERAPAMIL HCL CR 240 MG XR24H-CAP (VERAPAMIL HCL) Take 1 tablet by mouth once a day  #90 x 3   Entered by:   Kem Parkinson   Authorized by:   Colon Branch, MD, [facility]   Signed by:   Kem Parkinson on 08/09/2009   Method used:   Electronically to        [facility] (mail-order)             ,          Ph: [phone]       Fax: [phone]   RxID:   [id number]

## 2010-08-01 NOTE — Progress Notes (Signed)
Summary: Side Effects  Phone Note Call from Patient Call back at Work Phone [phone]   Summary of Call: Patient left message on triage stating that Spironolactone is giving her a rash between her fingers, cramps in legs and feet, and throat swelling. Please advise. Initial call taken by: Lucious Groves,  August 17, 2009 2:26 PM  Follow-up for Phone Call        Stop spironolactone - HCT. Allergy is more likely from it Use Triamcinolone cream, Claritin Take Furosemide 20 mg 1 by mouth qam RTC 2 wks w/BMET 401.1 Follow-up by: Tresa Garter MD,  August 17, 2009 5:49 PM  Additional Follow-up for Phone Call Additional follow up Details #1::        Pt informed, transferred to schedulers Additional Follow-up by: Lamar Sprinkles, CMA,  August 18, 2009 11:29 AM   New Allergies: ! ALDACTAZIDE (SPIRONOLACTONE-HCTZ) New/Updated Medications: TRIAMCINOLONE ACETONIDE 0.5 % CREA (TRIAMCINOLONE ACETONIDE) use two times a day prn LORATADINE 10 MG TABS (LORATADINE) 1 by mouth once daily as needed allergies FUROSEMIDE 20 MG TABS (FUROSEMIDE) 1 by mouth qd New Allergies: ! ALDACTAZIDE (SPIRONOLACTONE-HCTZ)Prescriptions: FUROSEMIDE 20 MG TABS (FUROSEMIDE) 1 by mouth qd  #30 x 1   Entered by:   Lamar Sprinkles, CMA   Authorized by:   Tresa Garter MD   Signed by:   Lamar Sprinkles, CMA on 08/18/2009   Method used:   Electronically to        [facility]  [address] (retail)       [address]       [place], [place]  [address]       Ph: [phone]       Fax: [phone]   RxID:   [id number] LORATADINE 10 MG TABS (LORATADINE) 1 by mouth once daily as needed allergies  #30 x 6   Entered by:   Lamar Sprinkles, CMA   Authorized by:   Tresa Garter MD   Signed by:   Lamar Sprinkles, CMA on 08/18/2009   Method used:   Electronically to        [facility]  [address] (retail)       [address]       [place], [place]  [address]       Ph:  [phone]       Fax: [phone]   RxID:   [id number] TRIAMCINOLONE ACETONIDE 0.5 % CREA (TRIAMCINOLONE ACETONIDE) use two times a day prn  #120 g x 3   Entered by:   Lamar Sprinkles, CMA   Authorized by:   Tresa Garter MD   Signed by:   Lamar Sprinkles, CMA on 08/18/2009   Method used:   Electronically to        [facility]  [address] (retail)       [address]       [place], [place]  [address]       Ph: [phone]       Fax: [phone]   RxID:   [id number]

## 2010-08-01 NOTE — Assessment & Plan Note (Signed)
Summary: 4 MO ROV /NWS  #   Vital Signs:  Patient profile:   55 year old female Height:      70 inches Weight:      209 pounds BMI:     30.10 Temp:     98.5 degrees F oral Pulse rate:   60 / minute Pulse rhythm:   regular Resp:     16 per minute BP sitting:   140 / 86  (left arm) Cuff size:   regular  Vitals Entered By: Lanier Prude, CMA(AAMA) (March 16, 2010 11:09 AM) CC: 4 mo f/u Is Patient Diabetic? No Comments pt needs new Rf on Loratadine   Primary Care Provider:  Dr Posey Rea  CC:  4 mo f/u.  History of Present Illness: The patient presents for a follow up of hypertension, COPD, GERD C/o leg cramps on Furosemide  Current Medications (verified): 1)  Prometrium 200 Mg  Caps (Progesterone Micronized) .... Take One Tablet Daily 2)  Estrace 1 Mg  Tabs (Estradiol) .... Take 1 Tablet By Mouth Once A Day 3)  Alprazolam 0.25 Mg Tabs (Alprazolam) .... Take 1 Tablet By Mouth Twice A Day As Needed Anxiety 4)  Verapamil Hcl Cr 240 Mg Xr24h-Cap (Verapamil Hcl) .... Take 1 Tablet By Mouth Once A Day 5)  Aspir-Low 81 Mg Tbec (Aspirin) .Marland Kitchen.. 1 Once Daily Pc 6)  Vitamin D3 1000 Unit  Tabs (Cholecalciferol) .... Take 1 Tablet By Mouth Once A Day 7)  Vitamin B-12 Cr 1000 Mcg  Tbcr (Cyanocobalamin) .... Take One Tablet By Mouth Daily 8)  Fish Oil  Oil (Fish Oil) .... Once Daily 9)  Ventolin Hfa 108 (90 Base) Mcg/act Aers (Albuterol Sulfate) .... 2 Inh Up To Qid As Needed Wheezing or Shortness of Breath 10)  Triamcinolone Acetonide 0.5 % Crea (Triamcinolone Acetonide) .... Use Two Times A Day Prn 11)  Loratadine 10 Mg Tabs (Loratadine) .Marland Kitchen.. 1 By Mouth Once Daily As Needed Allergies 12)  Furosemide 20 Mg Tabs (Furosemide) .Marland Kitchen.. 1 By Mouth Qd  Allergies (verified): 1)  ! Sulfa 2)  ! 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  Smokes 1/2 -1 ppd x 26 years. Married:  husband currently with anxiety disorder Raising grand-daughter (son in law in jail for killing grand son)  Review of Systems  The patient denies fever, chest pain, syncope, and abdominal pain.    Physical Exam  General:  ow female in nad Mouth:  Erythematous throat mucosa and intranasal erythema.  Neck:  No deformities, masses, or tenderness noted. Lungs:  clear to auscultation Heart:  rrr, with occasional extrasystole Abdomen:  soft and nontender, bs+ Msk:  No deformity or scoliosis noted of thoracic or lumbar spine.  R elbow NT Neurologic:  No cranial nerve deficits noted. Station and gait are normal. Plantar reflexes are down-going bilaterally. DTRs are symmetrical throughout. Sensory, motor and coordinative functions appear intact. Skin:  Intact without suspicious lesions or rashes Psych:  Cognition and judgment appear intact. Alert and cooperative with normal attention span and concentration. No apparent delusions, illusions, hallucinations   Impression & Recommendations:  Problem # 1:  COPD (ICD-496) Assessment Unchanged  Her updated medication list for this problem includes:    Ventolin Hfa 108 (90 Base) Mcg/act Aers (Albuterol sulfate) .Marland KitchenMarland KitchenMarland KitchenMarland Kitchen 2  inh up to qid as needed wheezing or shortness of breath  Problem # 2:  B12 DEFICIENCY (ICD-266.2) Assessment: Comment Only On the regimen of medicine(s) reflected in the chart    Problem # 3:  HYPERTENSION (ICD-401.9) Assessment: Improved  The following medications were removed from the medication list:    Furosemide 20 Mg Tabs (Furosemide) .Marland Kitchen... 1 by mouth qd Her updated medication list for this problem includes:    Verapamil Hcl Cr 240 Mg Xr24h-cap (Verapamil hcl) .Marland Kitchen... Take 1 tablet by mouth once a day    Losartan Potassium 100 Mg Tabs  (Losartan potassium) .Marland Kitchen... 1 by mouth once daily for blood pressure  BP today: 140/86 Prior BP: 140/80 (09/02/2009)  Labs Reviewed: K+: 3.8 (08/30/2009) Creat: : 1.1 (08/30/2009)   Chol: 216 (01/05/2008)   HDL: 37.6 (01/05/2008)   LDL: DEL (01/05/2008)   TG: 110 (01/05/2008)  Problem # 4:  CRAMP OF LIMB (ICD-729.82) Assessment: New d/c furosemide  Complete Medication List: 1)  Verapamil Hcl Cr 240 Mg Xr24h-cap (Verapamil hcl) .... Take 1 tablet by mouth once a day 2)  Prometrium 200 Mg Caps (Progesterone micronized) .... Take one tablet daily 3)  Estrace 1 Mg Tabs (Estradiol) .... Take 1 tablet by mouth once a day 4)  Alprazolam 0.25 Mg Tabs (Alprazolam) .... Take 1 tablet by mouth twice a day as needed anxiety 5)  Aspir-low 81 Mg Tbec (Aspirin) .Marland Kitchen.. 1 once daily pc 6)  Vitamin D3 1000 Unit Tabs (Cholecalciferol) .... Take 1 tablet by mouth once a day 7)  Vitamin B-12 Cr 1000 Mcg Tbcr (Cyanocobalamin) .... Take one tablet by mouth daily 8)  Fish Oil Oil (Fish oil) .... Once daily 9)  Ventolin Hfa 108 (90 Base) Mcg/act Aers (Albuterol sulfate) .... 2 inh up to qid as needed wheezing or shortness of breath 10)  Triamcinolone Acetonide 0.5 % Crea (Triamcinolone acetonide) .... Use two times a day prn 11)  Loratadine 10 Mg Tabs (Loratadine) .Marland Kitchen.. 1 by mouth once daily as needed allergies 12)  Losartan Potassium 100 Mg Tabs (Losartan potassium) .Marland Kitchen.. 1 by mouth once daily for blood pressure  Patient Instructions: 1)  Please schedule a follow-up appointment in 4 months well w/labs. 2)  Normal BP <130/85 Prescriptions: ALPRAZOLAM 0.25 MG TABS (ALPRAZOLAM) Take 1 tablet by mouth twice a day as needed anxiety  #60 x 4   Entered and Authorized by:   Tresa Garter MD   Signed by:   Tresa Garter MD on 03/16/2010   Method used:   Print then Give to Patient   RxID:   [id number] VERAPAMIL HCL CR 240 MG XR24H-CAP (VERAPAMIL HCL) Take 1 tablet by mouth once a day  #90 x 3    Entered and Authorized by:   Tresa Garter MD   Signed by:   Tresa Garter MD on 03/16/2010   Method used:   Print then Give to Patient   RxID:   [id number] LOSARTAN POTASSIUM 100 MG TABS (LOSARTAN POTASSIUM) 1 by mouth once daily for blood pressure  #90 x 3   Entered and Authorized by:   Tresa Garter MD   Signed by:   Tresa Garter MD on 03/16/2010   Method used:   Print then Give to Patient   RxID:   [id number]

## 2010-08-01 NOTE — Medication Information (Signed)
Summary: Aldactazide/[facility]  Aldactazide/[facility]   Imported By: Sherian Rein 08/05/2009 15:04:04  _____________________________________________________________________  External Attachment:    Type:   Image     Comment:   External Document

## 2010-08-01 NOTE — Progress Notes (Signed)
Summary: Work in request  Phone Note Call from Patient Call back at Work Phone [phone]   Summary of Call: Patient left message on triage that she has been sick and having trouble with incresed BP. Per the patient, she has taken Coricidin, but her BP is high anyway. Patient would like to be worked in, but only wants to see Plotnikov. Schedulers made patient appt for Monday. Please advise. Initial call taken by: Lucious Groves,  July 22, 2009 11:42 AM  Follow-up for Phone Call        PT CALLED BACK.  WAITING ON A CALL FROM [place].  STATES HER BP IS HIGH. REFUSED APPTS TODAY WITH DR Yetta Barre OR DR Felicity Coyer. Follow-up by: Hilarie Fredrickson,  July 22, 2009 11:49 AM  Additional Follow-up for Phone Call Additional follow up Details #1::        OK last appt today  - may need to wait Additional Follow-up by: Tresa Garter MD,  July 22, 2009 1:25 PM    Additional Follow-up for Phone Call Additional follow up Details #2::    Appt today at 4:30 pm  w/Dr Plotnikov- pt aware she may have a wait. Follow-up by: Verdell Face,  July 22, 2009 1:58 PM

## 2010-08-01 NOTE — Assessment & Plan Note (Signed)
Summary: 2 WEEK FOLLOW UP-LB   Vital Signs:  Patient profile:   55 year old female Weight:      212 pounds Temp:     98.9 degrees F oral Pulse rate:   45 / minute BP sitting:   140 / 80  (left arm)  Vitals Entered By: Tora Perches (September 02, 2009 3:33 PM) CC: f/u Is Patient Diabetic? No   Primary Care Provider:  Dr Posey Rea  CC:  f/u.  History of Present Illness: F/u HTN, rash  Preventive Screening-Counseling & Management  Alcohol-Tobacco     Smoking Status: current  Current Medications (verified): 1)  Prometrium 200 Mg  Caps (Progesterone Micronized) .... Take One Tablet Daily 2)  Estrace 1 Mg  Tabs (Estradiol) .... Take 1 Tablet By Mouth Once A Day 3)  Alprazolam 0.25 Mg Tabs (Alprazolam) .... Take 1 Tablet By Mouth Twice A Day As Needed Anxiety 4)  Verapamil Hcl Cr 240 Mg Xr24h-Cap (Verapamil Hcl) .... Take 1 Tablet By Mouth Once A Day 5)  Aspir-Low 81 Mg Tbec (Aspirin) .Marland Kitchen.. 1 Once Daily Pc 6)  Vitamin D3 1000 Unit  Tabs (Cholecalciferol) .... Take 1 Tablet By Mouth Once A Day 7)  Vitamin B-12 Cr 1000 Mcg  Tbcr (Cyanocobalamin) .... Take One Tablet By Mouth Daily 8)  Fish Oil  Oil (Fish Oil) .... Once Daily 9)  Ventolin Hfa 108 (90 Base) Mcg/act Aers (Albuterol Sulfate) .... 2 Inh Up To Qid As Needed Wheezing or Shortness of Breath 10)  Triamcinolone Acetonide 0.5 % Crea (Triamcinolone Acetonide) .... Use Two Times A Day Prn 11)  Loratadine 10 Mg Tabs (Loratadine) .Marland Kitchen.. 1 By Mouth Once Daily As Needed Allergies 12)  Furosemide 20 Mg Tabs (Furosemide) .Marland Kitchen.. 1 By Mouth Qd  Allergies: 1)  ! Sulfa 2)  ! 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  Smokes 1/2 -1 ppd x 26 years. Married:  husband  currently with anxiety disorder Raising grand-daughter (son in law in jail for killing grand son)  Review of Systems  The patient denies fever, dyspnea on exertion, abdominal pain, and melena.     Impression & Recommendations:  Problem # 1:  HYPERTENSION (ICD-401.9) Assessment Improved  Her updated medication list for this problem includes:    Verapamil Hcl Cr 240 Mg Xr24h-cap (Verapamil hcl) .Marland Kitchen... Take 1 tablet by mouth once a day    Furosemide 20 Mg Tabs (Furosemide) .Marland Kitchen... 1 by mouth qd  BP today: 140/80 Prior BP: 156/84 (07/22/2009)  Labs Reviewed: K+: 3.8 (08/30/2009) Creat: : 1.1 (08/30/2009)   Chol: 216 (01/05/2008)   HDL: 37.6 (01/05/2008)   LDL: DEL (01/05/2008)   TG: 110 (01/05/2008)  Problem # 2:  B12 DEFICIENCY (ICD-266.2) Assessment: Comment Only On prescription therapy   Problem # 3:  TOBACCO USE DISORDER/SMOKER-SMOKING CESSATION DISCUSSED (ICD-305.1) Assessment: Improved  Encouraged smoking cessation and discussed different methods for smoking cessation.   Problem # 4:  RASH AND OTHER NONSPECIFIC SKIN ERUPTION (ICD-782.1) Assessment: Improved  Her updated medication list for this problem includes:    Triamcinolone Acetonide 0.5 % Crea (Triamcinolone acetonide) ..... Use two times a day prn  Complete Medication List: 1)  Prometrium 200 Mg Caps (Progesterone micronized) .... Take one tablet daily 2)  Estrace 1 Mg Tabs (Estradiol) .... Take 1  tablet by mouth once a day 3)  Alprazolam 0.25 Mg Tabs (Alprazolam) .... Take 1 tablet by mouth twice a day as needed anxiety 4)  Verapamil Hcl Cr 240 Mg Xr24h-cap (Verapamil hcl) .... Take 1 tablet by mouth once a day 5)  Aspir-low 81 Mg Tbec (Aspirin) .Marland Kitchen.. 1 once daily pc 6)  Vitamin D3 1000 Unit Tabs (Cholecalciferol) .... Take 1 tablet by mouth once a day 7)  Vitamin B-12 Cr 1000 Mcg Tbcr (Cyanocobalamin) .... Take one tablet by mouth daily 8)  Fish Oil Oil (Fish oil) .... Once daily 9)  Ventolin Hfa 108 (90 Base)  Mcg/act Aers (Albuterol sulfate) .... 2 inh up to qid as needed wheezing or shortness of breath 10)  Triamcinolone Acetonide 0.5 % Crea (Triamcinolone acetonide) .... Use two times a day prn 11)  Loratadine 10 Mg Tabs (Loratadine) .Marland Kitchen.. 1 by mouth once daily as needed allergies 12)  Furosemide 20 Mg Tabs (Furosemide) .Marland Kitchen.. 1 by mouth qd  Patient Instructions: 1)  Try to eat more raw plant food, fresh and dry fruit, raw almonds, leafy vegetables, whole foods and less red meat, less animal fat. Poultry and fish is better for you than pork and beef. Avoid processed foods (canned soups, hot dogs, sausage, bacon , frozen dinners). Avoid corn syrup, high fructose syrup or aspartam and Splenda  containing drinks. Honey, Agave and Stevia are better sweeteners. Make your own  dressing with olive oil, wine vinegar, lemon juce, garlic etc. for your salads.  2)  Please schedule a follow-up appointment in 4 months.

## 2010-08-01 NOTE — Progress Notes (Signed)
Summary: Aldactizide  Phone Note Other Incoming   Summary of Call: Rec'd "Walk In Patient" form stating that patient needs prescription sent to [facility] and 14 day sent to [facility] for Aldactizide. I have written prescription to attach to [facility] form, is it ok to call in patient 14 day supply? Initial call taken by: Lucious Groves,  July 27, 2009 5:00 PM  Follow-up for Phone Call        OK Thx Follow-up by: Tresa Garter MD,  July 28, 2009 12:55 PM  Additional Follow-up for Phone Call Additional follow up Details #1::        Left vm for pt to check with pharm Additional Follow-up by: Lamar Sprinkles, CMA,  July 28, 2009 3:53 PM    Prescriptions: ALDACTAZIDE 25-25 MG TABS (SPIRONOLACTONE-HCTZ) 1 by mouth qam for blood pressure  #14 x 1   Entered by:   Lamar Sprinkles, CMA   Authorized by:   Tresa Garter MD   Signed by:   Lamar Sprinkles, CMA on 07/28/2009   Method used:   Electronically to        [facility]  [address] (retail)       [address]       [place], [place]  [address]       Ph: [phone]       Fax: [phone]   RxID:   [id number] ALDACTAZIDE 25-25 MG TABS (SPIRONOLACTONE-HCTZ) 1 by mouth qam for blood pressure  #14 x 1   Entered by:   Lamar Sprinkles, CMA   Authorized by:   Tresa Garter MD   Signed by:   Lamar Sprinkles, CMA on 07/28/2009   Method used:   Electronically to        [facility]. [phone]* (retail)       [address]       [place], [place]  [address]       Ph: [phone]       Fax: [phone]   RxID:   [id number]

## 2010-08-03 NOTE — Assessment & Plan Note (Signed)
Summary: [name] #   Vital Signs:  Patient profile:   55 year old female Height:      70 inches Weight:      210 pounds BMI:     30.24 Temp:     97.9 degrees F oral Pulse rate:   80 / minute Pulse rhythm:   regular Resp:     16 per minute BP sitting:   158 / 80  (left arm) Cuff size:   regular  Vitals Entered By: Lanier Prude, Beverly Gust) (July 21, 2010 3:17 PM)  Primary Care Provider:  Dr Posey Rea   History of Present Illness: The patient presents for a preventive health examination  BP 96/85  Current Medications (verified): 1)  Verapamil Hcl Cr 240 Mg Xr24h-Cap (Verapamil Hcl) .... Take 1 Tablet By Mouth Once A Day 2)  Prometrium 200 Mg  Caps (Progesterone Micronized) .... Take One Tablet Daily 3)  Estrace 1 Mg  Tabs (Estradiol) .... Take 1 Tablet By Mouth Once A Day 4)  Alprazolam 0.25 Mg Tabs (Alprazolam) .... Take 1 Tablet By Mouth Twice A Day As Needed Anxiety 5)  Aspir-Low 81 Mg Tbec (Aspirin) .Marland Kitchen.. 1 Once Daily Pc 6)  Vitamin D3 1000 Unit  Tabs (Cholecalciferol) .... Take 1 Tablet By Mouth Once A Day 7)  Vitamin B-12 Cr 1000 Mcg  Tbcr (Cyanocobalamin) .... Take One Tablet By Mouth Daily 8)  Fish Oil  Oil (Fish Oil) .... Once Daily 9)  Ventolin Hfa 108 (90 Base) Mcg/act Aers (Albuterol Sulfate) .... 2 Inh Up To Qid As Needed Wheezing or Shortness of Breath 10)  Triamcinolone Acetonide 0.5 % Crea (Triamcinolone Acetonide) .... Use Two Times A Day Prn 11)  Loratadine 10 Mg Tabs (Loratadine) .Marland Kitchen.. 1 By Mouth Once Daily As Needed Allergies 12)  Losartan Potassium 100 Mg Tabs (Losartan Potassium) .Marland Kitchen.. 1 By Mouth Once Daily For Blood Pressure 13)  Medroxyprogesterone Acetate 2.5 Mg Tabs (Medroxyprogesterone Acetate) .Marland Kitchen.. 1 By Mouth Once Daily  Allergies (verified): 1)  ! Sulfa 2)  ! 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  Smokes 1/2 -1 ppd x 26 years. 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  The patient denies anorexia, fever, vision loss, decreased hearing, hoarseness, chest pain, syncope, peripheral edema, prolonged cough, headaches, hemoptysis, abdominal pain, melena, hematochezia, severe indigestion/heartburn, hematuria, incontinence, genital sores, muscle weakness, suspicious skin lesions, transient blindness, difficulty walking, depression, unusual weight change, abnormal bleeding, enlarged lymph nodes, angioedema, and breast masses.    Physical Exam  General:  ow female in nad Head:  Normocephalic and atraumatic without obvious abnormalities. No apparent alopecia or balding. Eyes:  No corneal or conjunctival inflammation noted. EOMI. Perrla. Ears:  wax B Nose:  swollen mucosa Mouth:  WNL Neck:  No deformities, masses, or tenderness noted. Lungs:  clear to auscultation Heart:  rrr, with occasional extrasystole Abdomen:  soft and nontender, bs+ Msk:  No deformity or scoliosis noted of thoracic or lumbar  spine.  R elbow NT Pulses:  R and L carotid,radial,femoral,dorsalis pedis and posterior tibial pulses are full and equal bilaterally Neurologic:  No cranial nerve deficits noted. Station and gait are normal. Plantar reflexes are down-going bilaterally. DTRs are symmetrical throughout. Sensory, motor and coordinative functions appear intact. Skin:   Intact without suspicious lesions or rashes Cervical Nodes:  No lymphadenopathy noted Psych:  Cognition and judgment appear intact. Alert and cooperative with normal attention span and concentration. No apparent delusions, illusions, hallucinations   Impression & Recommendations:  Problem # 1:  ROUTINE GENERAL MEDICAL EXAM@HEALTH  CARE FACL (ICD-V70.0) Assessment New Health and age related issues were discussed. Available screening tests and vaccinations were discussed as well. Healthy life style including good diet and exercise was discussed.  The labs were reviewed with the patient.  Orders: EKG w/ Interpretation (93000)  Problem # 2:  HYPERTENSION (ICD-401.9) Assessment: Unchanged BP OK Her updated medication list for this problem includes:    Verapamil Hcl Cr 240 Mg Xr24h-cap (Verapamil hcl) .Marland Kitchen... Take 1 tablet by mouth once a day    Losartan Potassium 100 Mg Tabs (Losartan potassium) .Marland Kitchen... 1 by mouth once daily for blood pressure  Problem # 3:  B12 DEFICIENCY (ICD-266.2) Assessment: Improved On the regimen of medicine(s) reflected in the chart    Problem # 4:  ALLERGIC RHINITIS (ICD-477.9) Assessment: Deteriorated  Her updated medication list for this problem includes:    Loratadine 10 Mg Tabs (Loratadine) .Marland Kitchen... 1 by mouth once daily as needed allergies    Flonase 50 Mcg/act Susp (Fluticasone propionate) .Marland Kitchen... 1 spr each nostr qd as needed  Problem # 5:  CERUMEN IMPACTION (ICD-380.4) Assessment: New  Procedure: ear irrigation Reason: wax impaction B Risks/benefis were discussed. Both ears were irrigated with warm water. Large ammount of wax was recovered. Instrumentation with metal ear loop was performed to accomplish the removal. Tolerated well Complications: none    Orders: Cerumen Impaction Removal (21308)  Problem # 6:  TOBACCO USE DISORDER/SMOKER-SMOKING CESSATION DISCUSSED (ICD-305.1) Assessment: Unchanged  Encouraged smoking cessation and discussed  different methods for smoking cessation.   Complete Medication List: 1)  Verapamil Hcl Cr 240 Mg Xr24h-cap (Verapamil hcl) .... Take 1 tablet by mouth once a day 2)  Prometrium 200 Mg Caps (Progesterone micronized) .... Take one tablet daily 3)  Estrace 1 Mg Tabs (Estradiol) .... Take 1 tablet by mouth once a day 4)  Alprazolam 0.25 Mg Tabs (Alprazolam) .... Take 1 tablet by mouth twice a day as needed anxiety 5)  Aspir-low 81 Mg Tbec (Aspirin) .Marland Kitchen.. 1 once daily pc 6)  Vitamin D3 1000 Unit Tabs (Cholecalciferol) .... Take 1 tablet by mouth once a day 7)  Vitamin B-12 Cr 1000 Mcg Tbcr (Cyanocobalamin) .... Take one tablet by mouth qod 8)  Fish Oil Oil (Fish oil) .... Once daily 9)  Ventolin Hfa 108 (90 Base) Mcg/act Aers (Albuterol sulfate) .... 2 inh up to qid as needed wheezing or shortness of breath 10)  Triamcinolone Acetonide 0.5 % Crea (Triamcinolone acetonide) .... Use two times a day prn 11)  Loratadine 10 Mg Tabs (Loratadine) .Marland Kitchen.. 1 by mouth once daily as needed allergies 12)  Losartan Potassium 100 Mg Tabs (Losartan potassium) .Marland Kitchen.. 1 by mouth once daily for blood pressure 13)  Medroxyprogesterone Acetate 2.5 Mg Tabs (Medroxyprogesterone acetate) .Marland Kitchen.. 1 by mouth once daily 14)  Flonase 50 Mcg/act Susp (Fluticasone propionate) .Marland Kitchen.. 1 spr each nostr qd as needed  Patient Instructions: 1)  Please schedule a follow-up appointment  in 6 months. Prescriptions: VENTOLIN HFA 108 (90 BASE) MCG/ACT AERS (ALBUTEROL SULFATE) 2 inh up to qid as needed wheezing or shortness of breath  #1 x 6   Entered and Authorized by:   Tresa Garter MD   Signed by:   Tresa Garter MD on 07/21/2010   Method used:   Print then Give to Patient   RxID:   [id number] ALPRAZOLAM 0.25 MG TABS (ALPRAZOLAM) Take 1 tablet by mouth twice a day as needed anxiety  #60 x 4   Entered and Authorized by:   Tresa Garter MD   Signed by:   Tresa Garter MD on 07/21/2010   Method used:   Print then  Give to Patient   RxID:   [id number] FLONASE 50 MCG/ACT SUSP (FLUTICASONE PROPIONATE) 1 spr each nostr qd as needed  #3 x 3   Entered and Authorized by:   Tresa Garter MD   Signed by:   Tresa Garter MD on 07/21/2010   Method used:   Print then Give to Patient   RxID:   [id number]    Orders Added: 1)  EKG w/ Interpretation [93000] 2)  Est. Patient 40-64 years [99396] 3)  Cerumen Impaction Removal [43329]

## 2010-08-09 ENCOUNTER — Telehealth: Payer: Self-pay | Admitting: Internal Medicine

## 2010-08-17 NOTE — Progress Notes (Signed)
Summary: INHALER  Phone Note Call from Patient Call back at Work Phone [phone]   Summary of Call: Inhaler given by MD was 40$ and she can not afford this - req rx for cheaper alternative.   [facility] Initial call taken by: Lamar Sprinkles, CMA,  August 09, 2010 10:00 AM  Follow-up for Phone Call        Is Proair MDI less $$$? Follow-up by: Tresa Garter MD,  August 09, 2010 5:31 PM  Additional Follow-up for Phone Call Additional follow up Details #1::        No per pharm all will be same 40 dollar copay. I have discount card for proventil - is that an option? No discount card for proair or ventolin.  Additional Follow-up by: Lamar Sprinkles, CMA,  August 09, 2010 6:03 PM    Additional Follow-up for Phone Call Additional follow up Details #2::    Proventil +card sounds good! Thank you!  Follow-up by: Tresa Garter MD,  August 09, 2010 11:39 PM  Additional Follow-up for Phone Call Additional follow up Details #3:: Details for Additional Follow-up Action Taken: PT CALLED BACK WANTING A CALL  Pt informed, savings card up front for pt...................Marland KitchenLamar Sprinkles, CMA  August 10, 2010 5:04 PM  Additional Follow-up by: Hilarie Fredrickson,  August 10, 2010 3:24 PM  New/Updated Medications: PROVENTIL HFA 108 (90 BASE) MCG/ACT AERS (ALBUTEROL SULFATE) 2 inh up to qid as needed wheezing or shortness of breath Prescriptions: PROVENTIL HFA 108 (90 BASE) MCG/ACT AERS (ALBUTEROL SULFATE) 2 inh up to qid as needed wheezing or shortness of breath  #1 x 3   Entered by:   Lamar Sprinkles, CMA   Authorized by:   Tresa Garter MD   Signed by:   Lamar Sprinkles, CMA on 08/10/2010   Method used:   Electronically to        [facility]  [address] (retail)       [address]       [place], [place]  [address]       Ph: [phone]       Fax: [phone]   RxID:   [id number]

## 2010-08-22 ENCOUNTER — Ambulatory Visit
Admission: RE | Admit: 2010-08-22 | Discharge: 2010-08-22 | Disposition: A | Discharge status: Home / Self Care | Payer: BC Managed Care – PPO | Source: Ambulatory Visit | Attending: Internal Medicine | Admitting: Internal Medicine

## 2010-08-22 ENCOUNTER — Encounter: Payer: Self-pay | Admitting: Internal Medicine

## 2010-08-22 ENCOUNTER — Other Ambulatory Visit: Payer: Self-pay | Admitting: Internal Medicine

## 2010-08-22 ENCOUNTER — Ambulatory Visit (INDEPENDENT_AMBULATORY_CARE_PROVIDER_SITE_OTHER): Payer: BC Managed Care – PPO | Admitting: Internal Medicine

## 2010-08-22 DIAGNOSIS — M545 Low back pain, unspecified: Secondary | ICD-10-CM

## 2010-08-22 DIAGNOSIS — M542 Cervicalgia: Secondary | ICD-10-CM

## 2010-08-22 DIAGNOSIS — R42 Dizziness and giddiness: Secondary | ICD-10-CM

## 2010-08-22 DIAGNOSIS — M546 Pain in thoracic spine: Secondary | ICD-10-CM
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
## 2010-09-26 ENCOUNTER — Encounter: Payer: Self-pay | Admitting: Internal Medicine

## 2010-09-26 ENCOUNTER — Ambulatory Visit (INDEPENDENT_AMBULATORY_CARE_PROVIDER_SITE_OTHER): Payer: BC Managed Care – PPO | Admitting: Internal Medicine

## 2010-09-26 DIAGNOSIS — R202 Paresthesia of skin: Secondary | ICD-10-CM

## 2010-09-26 DIAGNOSIS — M545 Low back pain, unspecified: Secondary | ICD-10-CM

## 2010-09-26 DIAGNOSIS — R209 Unspecified disturbances of skin sensation: Secondary | ICD-10-CM

## 2010-09-26 DIAGNOSIS — M542 Cervicalgia: Secondary | ICD-10-CM

## 2010-09-26 DIAGNOSIS — R42 Dizziness and giddiness: Secondary | ICD-10-CM

## 2010-09-26 DIAGNOSIS — M546 Pain in thoracic spine: Secondary | ICD-10-CM

## 2010-09-26 NOTE — Assessment & Plan Note (Signed)
Better overall 

## 2010-09-26 NOTE — Assessment & Plan Note (Signed)
B hands - new. It is the first time she ever had it before. Will watch. C-spine MRI if reoccurs.
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
## 2010-09-26 NOTE — Assessment & Plan Note (Signed)
Resolved

## 2010-09-26 NOTE — Assessment & Plan Note (Signed)
Better  

## 2010-09-26 NOTE — Assessment & Plan Note (Signed)
Better. It is worisom that she had numbness in B hands.

## 2010-10-13 ENCOUNTER — Inpatient Hospital Stay (INDEPENDENT_AMBULATORY_CARE_PROVIDER_SITE_OTHER)
Admission: RE | Admit: 2010-10-13 | Discharge: 2010-10-13 | Disposition: A | Discharge status: Home / Self Care | Payer: BLUE CROSS/BLUE SHIELD | Source: Ambulatory Visit | Attending: Emergency Medicine | Admitting: Emergency Medicine

## 2010-10-13 DIAGNOSIS — T6391XA Toxic effect of contact with unspecified venomous animal, accidental (unintentional), initial encounter: Secondary | ICD-10-CM

## 2010-11-03 ENCOUNTER — Other Ambulatory Visit: Payer: Self-pay | Admitting: Internal Medicine

## 2010-11-14 NOTE — Assessment & Plan Note (Signed)
[facility]                            CARDIOLOGY OFFICE NOTE   JALESSA, PEYSER                    MRN:          [id number]  DATE:07/06/2008                            DOB:          [birth year]    Edia returns today for followup.  I initially saw her for  palpitations, high blood pressure, chest pain.   The patient is a significant smoker.  Her CT angiogram showed no  evidence of PE; however, she had a ground-glass appearance with mosaic  attenuation of the lungs, most likely due to small airway disease.  She  also had some significant lymph nodes, the largest measuring 1.2 cm in  the right and left hilum.   I talked to [place] at length.  She has significant problems with her  smoking and already has evidence of COPD.  I would like to get her into  see our pulmonary doctors to see what else needs to be done both in  terms of following her lymph nodes and putting her on therapy.  She has  not been on Chantix before, but is little scared of the side effects.  She has quit in the past for about 10 months cold [place].  I told her I  thought it was reasonable to start Wellbutrin to help her assist in her  smoking cessation.   In regards to the patient's heart, she had a stress echo today.  I  reviewed the images.  There was no evidence of coronary artery disease.  She had normal heart rate response with no ectopy.   Ejection fraction is normal at 60%.   She continues to have labile hypertension and has had another episode of  dizziness related to what she thinks was low blood pressure.  She is on  an unusual dose of enalapril at 20 b.i.d., this is a high dose and is  also usually a once a day drug.   I told her for the time being we would switch her over to another once a  day drug to just try to smooth out her blood pressure control and see if  these episodes disappear.   Remainder of her lab work was unremarkable and her sed rate  was normal.   Her review of systems otherwise negative.   PHYSICAL EXAMINATION:  GENERAL:  Remarkable for a bronchitic female in  no distress.  VITAL SIGNS:  Blood pressure is 130/80, pulse 80 and regular,  respiratory rate 14, afebrile.  HEENT:  Unremarkable.  NECK:  Carotids are normal without bruit.  No lymphadenopathy,  thyromegaly, or JVP elevation.  LUNGS:  Evidence for crackles at the base.  No active wheezing.  CARDIAC:  S1 and S2.  Normal heart sounds.  PMI normal.  ABDOMEN:  Benign.  Bowel sounds positive.  No AAA, no tenderness, no  bruit, no hepatosplenomegaly, no hepatojugular reflux.  EXTREMITIES:  No tenderness.  Distal pulse are intact.  No edema.  NEUROLOGIC:  Nonfocal.  SKIN:  Warm and dry.  MUSCULOSKELETAL:  No muscular weakness.   IMPRESSION:  1. Chest pain, resolved.  Normal stress echo,  improvement in symptoms      with Nexium.  2. Premature atrial contractions/premature ventricular contractions,      may be related to her chronic obstructive pulmonary disease.  In      the setting of normal left ventricular function, I would not treat      further.  3. Hypertension, some labile component, not clear of the etiology.      Initial episode of presyncope appeared to be vagal.  Discontinue      enalapril and start Cozaar 100 mg a day.  4. Smoking cessation.  Start Wellbutrin 150 a day.  5. Clinical chronic obstructive pulmonary disease with abnormal chest      CT.  Follow up Pulmonary.   I will see her back in about 3 months to reassess her blood pressure.     Noralyn Pick. Eden Emms, MD, [facility]  Electronically Signed    PCN/MedQ  DD: 07/06/2008  DT: 07/07/2008  Job #: [id number]

## 2010-11-14 NOTE — Assessment & Plan Note (Signed)
[facility]                            CARDIOLOGY OFFICE NOTE   Courtney Nielsen, Courtney Nielsen                    MRN:          [id number]  DATE:06/24/2008                            DOB:          Apr 29, 1956    Courtney Nielsen is a 55 year old patient of Dr. Posey Rea who was referred for  chest pain, presyncope, PVCs.   I actually did a heart catheterization on the patient back in 2002.  At  that time, she had been seen by Dr. Jens Som and had some chest pain.  Her Myoview suggested anterior wall ischemia; however, her cath was  totally normal.   Her coronary risk factors include hypertension and current smoking.   Her LDL cholesterol is also elevated mildly at I believe 150.   The patient was playing cards few nights ago when she had severe pain  behind her left knee.  She subsequently felt lightheaded and  presyncopal.  She had a friend who she was playing with who is a nurse  who monitored her vital signs.  Apparently, she was relatively  bradycardic and hypotensive.  Since that time she has been checking her  blood pressure at home and it has been fairly variable.  She has also  noted some irregular heartbeats.   In Dr. Loren Racer office, she was having some frequent PVCs and  trigeminy.   The patient has had few types of pain in her chest, the most prominent  one sounds more esophageal in nature.  She has a history of GERD.  She  has been taking some over-the-counter Nexium since she saw Dr. Posey Rea  with some improvement, but she continues to get pain in her throat.  It  does sound like some dysphasia.  She actually at one point said she was  scared to eat.   She has not had any previous history of esophageal stricture, GI  bleeding.  She has had some irritable bowel syndrome.   She also describes pain in the left side of her chest as a tightness, it  is not associated with diaphoresis.  She did have some shortness of  breath when she had her  presyncope and pain behind her left knee.   She is currently not having a cough or any active wheezing.  She seems  to think her symptoms are slowly improving.   Outside of this episode, she had felt fine.  She is under a little bit  of stress at home.   The patient's past medical history is remarkable for allergic rhinitis,  history of colon polyps, current smoking, hypertension, B12 deficiency,  GERD.   She is allergic to SULFA.   She is currently on enalapril 20 b.i.d., progesterone, Estrace,  albuterol b.i.d., an aspirin a day, vitamin D, and fish oil.   The patient is married.  Her husband appears to have some anxiety  disorder.  He has seen Dr. Antoine Poche in the past.  She is raising her 69-  year-old granddaughter named Courtney Nielsen, it is a sad situation, but  apparently her daughter's husband is in jail and at that one point  killed  another grandson.  The patient smokes at least a pack a day.  She  does not drink on a regular basis.  She is fairly sedentary.  She works  doing Astronomer.   Her family history is noncontributory.   PHYSICAL EXAMINATION:  GENERAL:  Remarkable for bronchitic female in no  distress.  VITAL SIGNS:  Blood pressure is 130/80, she is in sinus rhythm at a rate  of 70 with occasional PVCs, respiratory rate 14, afebrile.  HEENT:  Unremarkable.  NECK:  Carotids are normal without bruit.  No lymphadenopathy,  thyromegaly, or JVP elevation.  LUNGS:  Clear, good diaphragmatic motion.  No wheezing.  CARDIAC:  S1-S2 with normal heart sounds.  No murmur, rub, gallop, or  click.  ABDOMEN:  Benign.  Bowel sounds positive.  No AAA, no tenderness, no  bruit, no hepatosplenomegaly or hepatojugular reflux.  EXTREMITIES:  No tenderness.  Distal pulse intact.  No edema.  NEUROLOGIC:  Nonfocal.  SKIN:  Warm and dry.  MUSCULOSKELETAL:  No muscular weakness.   EKG shows sinus rhythm.  Outside of the PVCs, there is no acute changes,  ST elevation, ST  depression, or evidence of infarction.   IMPRESSION:  1. Presyncopal episode with pain behind the left knee.  This may have      been just a vagal reaction; however, the constellation of findings      including labile blood pressure, presyncope, pain behind the left      knee, and some left-sided chest pain would warrant a chest CT to      rule out pulmonary embolism.  We will also check a D-dimer.  2. Question of dysphagia, symptoms improving on Nexium.  Follow up      with Dr. Posey Rea.  Consider esophagogastroduodenoscopy if      symptoms continue.  3. Chest pain in the setting of premature ventricular contractions.      The patient has had a false positive Myoview in the past.  She had      normal coronaries by catheterization in 2002.  I think for the time      being we will do a followup stress echo next week as an alternative      imaging modality.  We will also check a CPK today.  4. Given the patient's constellation of symptoms, I think some      baseline lab work is in order.  The patient will have CBC and sed      rate, D-dimer, CPK with iso- enzymes.  I would like to make sure      that there is no inflammatory problem going on.  5. Current smoking.  The patient already has some chronic obstructive      pulmonary disease.  Clinically, she has an albuterol inhaler, spent      less than 10 minutes counseling her regarding smoking cessation,      clearly nicotine itself can lead her to have a propensity for      premature ventricular contractions.   The patient will have her CT and lab work done today.  I will see her  next week when she has her stress echo.  Further recommendations will be  based on this initial workup.     Noralyn Pick. Eden Emms, MD, [facility]  Electronically Signed    PCN/MedQ  DD: 06/24/2008  DT: 06/24/2008  Job #: [id number]   cc:   Georgina Quint. Plotnikov, MD

## 2010-11-17 NOTE — Discharge Summary (Signed)
[facility]  Patient:    Courtney Nielsen, Courtney Nielsen Visit Number: [id number] MRN: [id number]          Service Type: MED Location: [phone] Attending Physician:  Junious Silk Dictated by:   Brita Romp, P.A. Admit Date:  03/27/2001 Disc. Date: 03/28/01   CC:         Rosalyn Gess. Norins, M.D. [facility]   Discharge Summary  DISCHARGE DIAGNOSES: 1. Chest pain, status post cardiac catheterization. 2. History of irritable bowel syndrome. 3. History of mild chronic obstructive pulmonary disease.  HISTORY OF PRESENT ILLNESS:  Courtney Nielsen is a pleasant, 55 year old female who had been seen by Dr. Jens Som in the office for evaluation of chest pain.  On March 20, 2001, the patient had a Cardiolite exam which showed mild anterior ischemia as well as slightly milder amount of ischemia in the inferolateral distribution predominately at the base.  Dr. Jens Som saw the patient in the office on September 26, and felt that the best course of action was to admit the patient for cardiac catheterization.  He also started the patient on Protonix for the possibility of a GI etiology to her discomfort.  HOSPITAL COURSE:  On September 27, the patient was taken to the catheterization lab by Dr. Charlton Haws.  Catheterization showed normal coronary arteries with no significant disease.  Left ventricle ejection fraction was 65% with no wall motion abnormalities.  As a result, Dr. Eden Emms felt the patient was stable for discharge with referral back to her primary care physician.  DISCHARGE MEDICATIONS: 1. Enteric coated aspirin 81 mg q.d. 2. Protonix 40 mg q.d.  ACTIVITY:  Avoid driving, heavy lifting or tub baths x 2 days.  DIET:  Low fat diet.  WOUND CARE:  She is instructed to watch the catheterization site for pain, bleeding or swelling and to call the [place] office for any of these problems.  SPECIAL INSTRUCTIONS:  She can discontinue taking the Lopressor  as it had been started when her chest pain began.  FOLLOWUP:  She is to contact Dr. Debby Bud for an appointment within approximately 10 days.  LABORATORY DATA AND X-RAY FINDINGS:  Sodium 137, potassium 3.7, chloride 105, CO2 25, BUN 7, creatinine 0.8, glucose 81.  Total protein 6.1, albumin 3.4, AST 15, ALT 13, Alk phos 73, total bilirubin 0.9.  Quantitative serum HCG was less than 2.  White count 10.6, hemoglobin 13.3, hematocrit 38.6, platelets 251.  Electrocardiogram showed normal sinus rhythm at approximately 82, PR interval at 152, QRS 84, QTC of 420, axis 67. Dictated by:   Brita Romp, P.A. Attending Physician:  Junious Silk DD:  03/28/01 TD:  03/28/01 Job: [id number]

## 2010-11-17 NOTE — Cardiovascular Report (Signed)
[place]  Patient:    Courtney Nielsen, Courtney Nielsen Visit Number: [id number] MRN: [id number]          Service Type: MED Location: [phone] Attending Physician:  Junious Silk Dictated by:   Noralyn Pick Eden Emms, M.D. [facility] Proc. Date: 03/28/01 Admit Date:  03/27/2001   CC:         Madolyn Frieze. Jens Som, M.D. [facility]   Cardiac Catheterization  PROCEDURE PERFORMED: Coronary arteriography.  INDICATIONS: Abnormal Cardiolite ______ suggesting anterior and inferolateral ischemia with chest pain.  CORONARY ARTERIOGRAPHY: Left main coronary artery:  The left main coronary artery was normal.  Left anterior descending artery: The left anterior descending artery was normal.  First and second diagonal branches are normal.  Circumflex coronary artery: The circumflex coronary artery is nondominant and it was normal.  Right coronary artery: The right coronary artery had some catheter-tip spasm. However, there was no discrete lesion and the entire vessel was dominant and normal.  RIGHT ANTERIOR OBLIQUE VENTRICULOGRAPHY: The RAO ventriculography was normal. Ejection fraction was in excess of 65%. LV pressure was 116/9, aortic pressure was 116/68.  At the end of the case the right femoral artery was closed using the Perclose device with excellent hemostasis.  The patient was given a gram of Ancef.  She will be discharged later today to follow up with her primary care MD. Dictated by:   Noralyn Pick. Eden Emms, M.D. [facility] Attending Physician:  Junious Silk DD:  03/28/01 TD:  03/28/01 Job: [phone] [id number]

## 2011-01-16 ENCOUNTER — Ambulatory Visit (INDEPENDENT_AMBULATORY_CARE_PROVIDER_SITE_OTHER): Payer: BLUE CROSS/BLUE SHIELD | Admitting: Internal Medicine

## 2011-01-16 ENCOUNTER — Encounter: Payer: Self-pay | Admitting: Internal Medicine

## 2011-01-16 DIAGNOSIS — M545 Low back pain, unspecified: Secondary | ICD-10-CM

## 2011-01-16 DIAGNOSIS — E538 Deficiency of other specified B group vitamins: Secondary | ICD-10-CM

## 2011-01-16 DIAGNOSIS — I10 Essential (primary) hypertension: Secondary | ICD-10-CM

## 2011-01-16 MED ORDER — MELOXICAM 15 MG PO TABS: 15.0000 mg | ORAL_TABLET | Freq: Every day | ORAL | Status: DC | PRN

## 2011-01-16 MED ORDER — ALPRAZOLAM 0.25 MG PO TABS: 0.2500 mg | ORAL_TABLET | Freq: Two times a day (BID) | ORAL | Status: DC | PRN

## 2011-01-16 NOTE — Assessment & Plan Note (Signed)
On Rx 

## 2011-01-16 NOTE — Assessment & Plan Note (Signed)
Start PT Ortho cons suggested
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
## 2011-01-24 ENCOUNTER — Telehealth: Payer: Self-pay | Admitting: *Deleted

## 2011-01-24 NOTE — Telephone Encounter (Signed)
SPoke w/pt - SHe req status of referral to physical therapy. Notes state info was already [facility] and they will contact patient. Informed her to call back next week if she has not heard from PT.

## 2011-04-17 ENCOUNTER — Telehealth: Payer: Self-pay | Admitting: *Deleted

## 2011-04-17 MED ORDER — MELOXICAM 15 MG PO TABS: 15.0000 mg | ORAL_TABLET | Freq: Every day | ORAL | Status: DC | PRN

## 2011-04-17 NOTE — Telephone Encounter (Signed)
Rf req for Meloxicam

## 2011-04-18 ENCOUNTER — Ambulatory Visit: Payer: BLUE CROSS/BLUE SHIELD

## 2011-04-18 DIAGNOSIS — Z0000 Encounter for general adult medical examination without abnormal findings: Secondary | ICD-10-CM

## 2011-04-18 LAB — CBC WITH DIFFERENTIAL/PLATELET
Basophils Relative: 0.4 % (ref 0.0–3.0)
Eosinophils Relative: 1.2 % (ref 0.0–5.0)
HCT: 41.3 % (ref 36.0–46.0)
MCV: 94.9 fl (ref 78.0–100.0)
Monocytes Absolute: 0.6 10*3/uL (ref 0.1–1.0)
Neutrophils Relative %: 73 % (ref 43.0–77.0)
RBC: 4.36 Mil/uL (ref 3.87–5.11)
WBC: 8.9 10*3/uL (ref 4.5–10.5)

## 2011-04-18 LAB — URINALYSIS, ROUTINE W REFLEX MICROSCOPIC
Total Protein, Urine: NEGATIVE
Urine Glucose: NEGATIVE
pH: 6.5 (ref 5.0–8.0)

## 2011-04-18 LAB — BASIC METABOLIC PANEL
Chloride: 107 mEq/L (ref 96–112)
Creatinine, Ser: 0.9 mg/dL (ref 0.4–1.2)
Potassium: 4 mEq/L (ref 3.5–5.1)

## 2011-04-18 LAB — HEPATIC FUNCTION PANEL
ALT: 17 U/L (ref 0–35)
AST: 18 U/L (ref 0–37)
Alkaline Phosphatase: 76 U/L (ref 39–117)
Bilirubin, Direct: 0.1 mg/dL (ref 0.0–0.3)
Total Bilirubin: 0.8 mg/dL (ref 0.3–1.2)

## 2011-04-18 LAB — LIPID PANEL
LDL Cholesterol: 130 mg/dL — ABNORMAL HIGH (ref 0–99)
Total CHOL/HDL Ratio: 4

## 2011-04-19 ENCOUNTER — Ambulatory Visit (INDEPENDENT_AMBULATORY_CARE_PROVIDER_SITE_OTHER)
Admission: RE | Admit: 2011-04-19 | Discharge: 2011-04-19 | Disposition: A | Discharge status: Home / Self Care | Payer: BLUE CROSS/BLUE SHIELD | Source: Ambulatory Visit | Attending: Endocrinology | Admitting: Endocrinology

## 2011-04-19 ENCOUNTER — Ambulatory Visit (INDEPENDENT_AMBULATORY_CARE_PROVIDER_SITE_OTHER): Payer: BLUE CROSS/BLUE SHIELD | Admitting: Endocrinology

## 2011-04-19 ENCOUNTER — Encounter: Payer: Self-pay | Admitting: Endocrinology

## 2011-04-19 VITALS — BP 142/72 | HR 101 | Temp 97.7°F | Ht 70.5 in | Wt 210.0 lb

## 2011-04-19 DIAGNOSIS — R059 Cough, unspecified: Secondary | ICD-10-CM | POA: Insufficient documentation

## 2011-04-19 DIAGNOSIS — R05 Cough: Secondary | ICD-10-CM | POA: Insufficient documentation

## 2011-04-19 DIAGNOSIS — J189 Pneumonia, unspecified organism: Secondary | ICD-10-CM

## 2011-04-19 MED ORDER — PROMETHAZINE-CODEINE 6.25-10 MG/5ML PO SYRP: 5.0000 mL | ORAL_SOLUTION | ORAL | Status: DC | PRN

## 2011-04-19 MED ORDER — CEFUROXIME AXETIL 250 MG PO TABS: 250.0000 mg | ORAL_TABLET | Freq: Two times a day (BID) | ORAL | Status: DC

## 2011-04-19 MED ORDER — CEFUROXIME AXETIL 500 MG PO TABS: 500.0000 mg | ORAL_TABLET | Freq: Two times a day (BID) | ORAL | Status: DC

## 2011-04-19 NOTE — Patient Instructions (Addendum)
Here is a prescription for a cough medication, and 1 for an antibiotic. here is a sample of "advair-115."  take 1 puff 2x a day.  rinse mouth after using.  You can still take albuterol as needed. Let's check a chest-x-ray.  please call [phone] to hear your test results.  You will be prompted to enter the 9-digit "MRN" number that appears at the top left of this page, followed by #.  Then you will hear the message. Take tylenol, and drink plenty of fluids. I hope you feel better soon.  If you don't feel better by next week, please call dr plotnikov.

## 2011-04-19 NOTE — Progress Notes (Signed)
Subjective:    Patient ID: Courtney Nielsen, female    DOB: 10-Nov-1955, 55 y.o.   MRN: [id number]  HPI 1 week of prod cough in the chest, and assoc myalgias, diarrhea.  She has had fever for the past few days.   Past Medical History  Diagnosis Date  . Allergic rhinitis   . Colonic polyp   . Smoker   . Hypertension   . Vitamin B12 deficiency   . GERD (gastroesophageal reflux disease)   . Chest pain   . COPD (chronic obstructive pulmonary disease)    No past surgical history on file.  History   Social History  . Marital Status: Married    Spouse Name: N/A    Number of Children: N/A  . Years of Education: N/A   Occupational History  . Not on file.   Social History Main Topics  . Smoking status: Current Everyday Smoker    Types: Cigarettes  . Smokeless tobacco: Not on file  . Alcohol Use: Not on file  . Drug Use: Not on file  . Sexually Active: Not on file   Other Topics Concern  . Not on file   Social History Narrative   Raising grand-daughter (son in law in jail for killing grand son)    Current Outpatient Prescriptions on File Prior to Visit  Medication Sig Dispense Refill  . albuterol (PROVENTIL HFA;VENTOLIN HFA) 108 (90 BASE) MCG/ACT inhaler Inhale 2 puffs into the lungs 4 (four) times daily. As needed for wheezing or shortness of breath.       . ALPRAZolam (XANAX) 0.25 MG tablet Take 1 tablet (0.25 mg total) by mouth 2 (two) times daily as needed. For anxiety  60 tablet  3  . aspirin 81 MG tablet Take 81 mg by mouth daily.        . Cholecalciferol (VITAMIN D3) 1000 UNITS tablet Take 1,000 Units by mouth daily.        . cyanocobalamin 1000 MCG tablet Take 100 mcg by mouth every other day.        . estradiol (ESTRACE) 1 MG tablet Take 1 mg by mouth daily.        . fish oil-omega-3 fatty acids 1000 MG capsule Take 1 capsule by mouth daily.        . fluticasone (FLONASE) 50 MCG/ACT nasal spray 1 spray by Nasal route daily.        Marland Kitchen loratadine (CLARITIN) 10 MG  tablet TAKE ONE TABLET BY MOUTH EVERY DAY AS NEEDED FOR  ALLERGIES  30 tablet  5  . losartan (COZAAR) 100 MG tablet Take 100 mg by mouth daily. For blood pressure.       . medroxyPROGESTERone (PROVERA) 2.5 MG tablet Take 2.5 mg by mouth daily.        . meloxicam (MOBIC) 15 MG tablet Take 1 tablet (15 mg total) by mouth daily as needed for pain.  90 tablet  2  . progesterone (PROMETRIUM) 200 MG capsule Take 200 mg by mouth daily.        Marland Kitchen triamcinolone (KENALOG) 0.5 % cream Apply 1 application topically 2 (two) times daily.        . verapamil (CALAN-SR) 240 MG CR tablet Take 240 mg by mouth daily.          Allergies  Allergen Reactions  . Furosemide     REACTION: cramps  . Sulfonamide Derivatives     Family History  Problem Relation Age of Onset  . Arthritis Mother   .  COPD Father   . Cancer Father     Throat and stomach  . Arthritis Father   . Asthma Brother   . Emphysema Paternal Grandfather   . Hypertension Other     BP 142/72  Pulse 101  Temp(Src) 97.7 F (36.5 C) (Oral)  Ht 5' 10.5" (1.791 m)  Wt 210 lb (95.255 kg)  BMI 29.71 kg/m2  SpO2 97%   Review of Systems She has slight increase in her chronic wheezing.  Denies brbpr.      Objective:   Physical Exam VITAL SIGNS:  See vs page GENERAL: no distress head: no deformity eyes: no periorbital swelling, no proptosis external nose and ears are normal mouth: no lesion seen Both eac's and tm's are normal NECK: There is no palpable thyroid enlargement.  No thyroid nodule is palpable.  No palpable lymphadenopathy at the anterior neck. LUNGS:  Clear to auscultation, except for a few rales on the right.   HEART:  Regular rate and rhythm without murmurs noted. Normal S1,S2.   ABDOMEN: abdomen is soft, nontender.  no hepatosplenomegaly.   not distended.  no hernia   Cxr: new infiltrate    Assessment & Plan:  Pneumonia, new Htn, with probable situational component. Diarrhea, prob due to the acute illness

## 2011-04-21 DIAGNOSIS — J189 Pneumonia, unspecified organism: Secondary | ICD-10-CM | POA: Insufficient documentation

## 2011-04-23 ENCOUNTER — Ambulatory Visit (INDEPENDENT_AMBULATORY_CARE_PROVIDER_SITE_OTHER): Payer: BLUE CROSS/BLUE SHIELD | Admitting: Internal Medicine

## 2011-04-23 ENCOUNTER — Encounter: Payer: Self-pay | Admitting: Internal Medicine

## 2011-04-23 VITALS — BP 120/82 | HR 96 | Temp 97.3°F | Resp 20 | Wt 210.0 lb

## 2011-04-23 DIAGNOSIS — E538 Deficiency of other specified B group vitamins: Secondary | ICD-10-CM

## 2011-04-23 DIAGNOSIS — Z136 Encounter for screening for cardiovascular disorders: Secondary | ICD-10-CM

## 2011-04-23 DIAGNOSIS — J189 Pneumonia, unspecified organism: Secondary | ICD-10-CM

## 2011-04-23 DIAGNOSIS — J449 Chronic obstructive pulmonary disease, unspecified: Secondary | ICD-10-CM

## 2011-04-23 DIAGNOSIS — Z0000 Encounter for general adult medical examination without abnormal findings: Secondary | ICD-10-CM | POA: Insufficient documentation

## 2011-04-23 DIAGNOSIS — L299 Pruritus, unspecified: Secondary | ICD-10-CM | POA: Insufficient documentation

## 2011-04-23 MED ORDER — MOXIFLOXACIN HCL 400 MG PO TABS: 400.0000 mg | ORAL_TABLET | Freq: Every day | ORAL | Status: AC

## 2011-04-23 MED ORDER — TRIAMCINOLONE ACETONIDE 0.5 % EX CREA: 1.0000 "application " | TOPICAL_CREAM | Freq: Two times a day (BID) | CUTANEOUS | Status: DC

## 2011-04-23 MED ORDER — PREDNISONE 10 MG PO TABS: ORAL_TABLET | ORAL | Status: AC

## 2011-04-23 NOTE — Progress Notes (Signed)
Subjective:    Patient ID: Courtney Nielsen, female    DOB: May 26, 1956, 55 y.o.   MRN: [id number]  HPI  The patient is here for a wellness exam. The patient has been doing well overall without major physical or psychological issues going on lately. She was dx'd w/a RLL pneumonia 4 d ago. C/o itching at night after a second dose. The patient needs to address  chronic hypertension and COPD (worse).  Review of Systems  Constitutional: Negative.  Negative for fever, chills, diaphoresis, activity change, appetite change, fatigue and unexpected weight change.  HENT: Positive for congestion. Negative for hearing loss, ear pain, nosebleeds, sore throat, facial swelling, rhinorrhea, sneezing, mouth sores, trouble swallowing, neck pain, neck stiffness, postnasal drip, sinus pressure and tinnitus.   Eyes: Negative for pain, discharge, redness, itching and visual disturbance.  Respiratory: Positive for cough, chest tightness and shortness of breath. Negative for wheezing and stridor.   Cardiovascular: Negative for chest pain, palpitations and leg swelling.  Gastrointestinal: Negative for nausea, diarrhea, constipation, blood in stool, abdominal distention, anal bleeding and rectal pain.  Genitourinary: Negative for dysuria, urgency, frequency, hematuria, flank pain, vaginal bleeding, vaginal discharge, difficulty urinating, genital sores and pelvic pain.  Musculoskeletal: Negative for back pain, joint swelling, arthralgias and gait problem.  Skin: Negative.  Negative for rash.  Neurological: Negative for dizziness, tremors, seizures, syncope, speech difficulty, weakness, numbness and headaches.  Hematological: Negative for adenopathy. Does not bruise/bleed easily.  Psychiatric/Behavioral: Negative for suicidal ideas, behavioral problems, sleep disturbance, dysphoric mood and decreased concentration. The patient is not nervous/anxious.        Objective:   Physical Exam  Constitutional: She appears  well-developed. No distress.       Dyspneic w/act  HENT:  Head: Normocephalic.  Right Ear: External ear normal.  Left Ear: External ear normal.  Nose: Nose normal.  Mouth/Throat: Oropharynx is clear and moist.  Eyes: Conjunctivae are normal. Pupils are equal, round, and reactive to light. Right eye exhibits no discharge. Left eye exhibits no discharge.  Neck: Normal range of motion. Neck supple. No JVD present. No tracheal deviation present. No thyromegaly present.  Cardiovascular: Normal rate, regular rhythm and normal heart sounds.  Exam reveals no gallop and no friction rub.   No murmur heard.      Tachy   Pulmonary/Chest: No stridor. No respiratory distress. She has no wheezes. She has rales (RLL).  Abdominal: Soft. Bowel sounds are normal. She exhibits no distension and no mass. There is no tenderness. There is no rebound and no guarding.  Musculoskeletal: She exhibits no edema and no tenderness.  Lymphadenopathy:    She has no cervical adenopathy.  Neurological: She displays normal reflexes. No cranial nerve deficit. She exhibits normal muscle tone. Coordination normal.  Skin: No rash noted. No erythema.  Psychiatric: She has a normal mood and affect. Her behavior is normal. Judgment and thought content normal.   02 sat was 87% briefly after walking - then 90-94%   Lab Results  Component Value Date   WBC 8.9 04/18/2011   HGB 13.9 04/18/2011   HCT 41.3 04/18/2011   PLT 181.0 04/18/2011   GLUCOSE 96 04/18/2011   CHOL 192 04/18/2011   TRIG 84.0 04/18/2011   HDL 44.90 04/18/2011   LDLDIRECT 164.6 01/05/2008   LDLCALC 130* 04/18/2011   ALT 17 04/18/2011   AST 18 04/18/2011   NA 139 04/18/2011   K 4.0 04/18/2011   CL 107 04/18/2011   CREATININE 0.9 04/18/2011  BUN 7 04/18/2011   CO2 25 04/18/2011   TSH 0.50 04/18/2011       Assessment & Plan:

## 2011-04-23 NOTE — Assessment & Plan Note (Signed)
Worse. Change to Avelox

## 2011-04-23 NOTE — Progress Notes (Signed)
Resting pulse ox- 94% After 1 lap- 90% After 2 laps- 87% for 3-5 seconds then returned to 90%. HR= 111

## 2011-04-23 NOTE — Assessment & Plan Note (Signed)
D/c Ceftin

## 2011-04-23 NOTE — Assessment & Plan Note (Signed)
We discussed age appropriate health related issues, including available/recomended screening tests and vaccinations. We discussed a need for adhering to healthy diet and exercise. Labs/EKG were reviewed/ordered. All questions were answered.  GYN q 12 mo Smoking cessation discussed

## 2011-04-23 NOTE — Assessment & Plan Note (Signed)
Prednisone 10 mg: take 4 tabs a day x 3 days; then 3 tabs a day x 4 days; then 2 tabs a day x 4 days, then 1 tab a day x 6 days, then stop. Take pc. Continue with current prescription therapy as reflected on the Med list.

## 2011-04-23 NOTE — Assessment & Plan Note (Signed)
Continue with current prescription therapy as reflected on the Med list.  

## 2011-05-03 ENCOUNTER — Other Ambulatory Visit: Payer: Self-pay | Admitting: *Deleted

## 2011-05-03 MED ORDER — VERAPAMIL HCL 240 MG PO TBCR: 240.0000 mg | EXTENDED_RELEASE_TABLET | Freq: Every day | ORAL | Status: DC

## 2011-05-30 ENCOUNTER — Other Ambulatory Visit: Payer: Self-pay | Admitting: *Deleted

## 2011-05-30 MED ORDER — LOSARTAN POTASSIUM 100 MG PO TABS: 100.0000 mg | ORAL_TABLET | Freq: Every day | ORAL | Status: DC

## 2011-06-07 ENCOUNTER — Other Ambulatory Visit (INDEPENDENT_AMBULATORY_CARE_PROVIDER_SITE_OTHER): Payer: BC Managed Care – PPO

## 2011-06-07 ENCOUNTER — Encounter: Payer: Self-pay | Admitting: Internal Medicine

## 2011-06-07 ENCOUNTER — Ambulatory Visit (INDEPENDENT_AMBULATORY_CARE_PROVIDER_SITE_OTHER): Payer: BC Managed Care – PPO | Admitting: Internal Medicine

## 2011-06-07 VITALS — BP 112/84 | HR 80 | Temp 97.5°F | Resp 16 | Wt 215.0 lb

## 2011-06-07 DIAGNOSIS — I10 Essential (primary) hypertension: Secondary | ICD-10-CM

## 2011-06-07 DIAGNOSIS — J449 Chronic obstructive pulmonary disease, unspecified: Secondary | ICD-10-CM

## 2011-06-07 DIAGNOSIS — R609 Edema, unspecified: Secondary | ICD-10-CM

## 2011-06-07 DIAGNOSIS — R600 Localized edema: Secondary | ICD-10-CM

## 2011-06-07 DIAGNOSIS — Z23 Encounter for immunization: Secondary | ICD-10-CM

## 2011-06-07 DIAGNOSIS — E538 Deficiency of other specified B group vitamins: Secondary | ICD-10-CM

## 2011-06-07 LAB — BASIC METABOLIC PANEL
BUN: 8 mg/dL (ref 6–23)
GFR: 57.16 mL/min — ABNORMAL LOW (ref >60.00)
Potassium: 4.1 mEq/L (ref 3.5–5.1)

## 2011-06-07 LAB — TSH: TSH: 0.57 u[IU]/mL (ref 0.35–5.50)

## 2011-06-07 MED ORDER — IPRATROPIUM-ALBUTEROL 20-100 MCG/ACT IN AERS: 2.0000 | INHALATION_SPRAY | Freq: Four times a day (QID) | RESPIRATORY_TRACT | Status: DC | PRN

## 2011-06-07 MED ORDER — LORATADINE 10 MG PO TABS: 10.0000 mg | ORAL_TABLET | Freq: Every day | ORAL | Status: DC

## 2011-06-07 MED ORDER — METHYLPREDNISOLONE ACETATE PF 80 MG/ML IJ SUSP
120.0000 mg | Freq: Once | INTRAMUSCULAR | Status: AC
  Administered 2011-06-07: 120 mg via INTRAMUSCULAR

## 2011-06-07 NOTE — Assessment & Plan Note (Signed)
Continue with current prescription therapy as reflected on the Med list.  

## 2011-06-07 NOTE — Assessment & Plan Note (Signed)
Exacerbation 11/12 Depo 120 mg in Combivent

## 2011-06-07 NOTE — Assessment & Plan Note (Addendum)
Ven Doppler US to r/o DVT D dimer See Meds

## 2011-06-08 ENCOUNTER — Telehealth: Payer: Self-pay | Admitting: Internal Medicine

## 2011-06-08 ENCOUNTER — Encounter (INDEPENDENT_AMBULATORY_CARE_PROVIDER_SITE_OTHER): Payer: BC Managed Care – PPO | Admitting: Cardiology

## 2011-06-08 DIAGNOSIS — M7989 Other specified soft tissue disorders: Secondary | ICD-10-CM

## 2011-06-08 NOTE — Telephone Encounter (Signed)
Patient informed. 

## 2011-06-08 NOTE — Telephone Encounter (Signed)
Stacey, please, inform patient that her ven doppler US did not show a clot - good news Thx

## 2011-06-11 ENCOUNTER — Encounter: Payer: Self-pay | Admitting: Internal Medicine

## 2011-06-11 NOTE — Progress Notes (Signed)
  Subjective:    Patient ID: Courtney Nielsen, female    DOB: [birth year], 55 y.o.   MRN: [id number]  HPI  C/o SOB and R leg swelling and pain x 2 wks. Not better. C/o cough F/u HTN  Review of Systems  Constitutional: Negative for chills, activity change, appetite change, fatigue and unexpected weight change.  HENT: Negative for congestion, mouth sores and sinus pressure.   Eyes: Negative for visual disturbance.  Respiratory: Positive for cough, chest tightness and shortness of breath.   Cardiovascular: Positive for leg swelling.  Gastrointestinal: Negative for nausea and abdominal pain.  Genitourinary: Negative for frequency, difficulty urinating and vaginal pain.  Musculoskeletal: Negative for back pain and gait problem.  Skin: Negative for pallor and rash.  Neurological: Negative for dizziness, tremors, weakness, numbness and headaches.  Psychiatric/Behavioral: Negative for confusion and sleep disturbance. The patient is not nervous/anxious.        Objective:   Physical Exam  Constitutional: She appears well-developed and well-nourished. No distress.  HENT:  Head: Normocephalic.  Right Ear: External ear normal.  Left Ear: External ear normal.  Nose: Nose normal.  Mouth/Throat: Oropharynx is clear and moist.  Eyes: Conjunctivae are normal. Pupils are equal, round, and reactive to light. Right eye exhibits no discharge. Left eye exhibits no discharge.  Neck: Normal range of motion. Neck supple. No JVD present. No tracheal deviation present. No thyromegaly present.  Cardiovascular: Normal rate, regular rhythm and normal heart sounds.   Pulmonary/Chest: No stridor. No respiratory distress. She has no wheezes.  Abdominal: Soft. Bowel sounds are normal. She exhibits no distension and no mass. There is no tenderness. There is no rebound and no guarding.  Musculoskeletal: She exhibits edema (RLE). She exhibits no tenderness.       1+  Lymphadenopathy:    She has no cervical  adenopathy.  Neurological: She displays normal reflexes. No cranial nerve deficit. She exhibits normal muscle tone. Coordination normal.  Skin: No rash noted. No erythema.  Psychiatric: She has a normal mood and affect. Her behavior is normal. Judgment and thought content normal.          Assessment & Plan:

## 2011-06-25 ENCOUNTER — Ambulatory Visit: Payer: BC Managed Care – PPO | Admitting: Internal Medicine

## 2011-06-29 ENCOUNTER — Ambulatory Visit: Payer: BC Managed Care – PPO | Admitting: Internal Medicine

## 2011-07-19 ENCOUNTER — Other Ambulatory Visit: Payer: Self-pay | Admitting: Obstetrics and Gynecology

## 2011-07-19 DIAGNOSIS — R928 Other abnormal and inconclusive findings on diagnostic imaging of breast: Secondary | ICD-10-CM

## 2011-07-26 ENCOUNTER — Ambulatory Visit
Admission: RE | Admit: 2011-07-26 | Discharge: 2011-07-26 | Disposition: A | Discharge status: Home / Self Care | Payer: BC Managed Care – PPO | Source: Ambulatory Visit | Attending: Obstetrics and Gynecology | Admitting: Obstetrics and Gynecology

## 2011-07-26 DIAGNOSIS — R928 Other abnormal and inconclusive findings on diagnostic imaging of breast: Secondary | ICD-10-CM

## 2011-08-17 ENCOUNTER — Telehealth: Payer: Self-pay | Admitting: Internal Medicine

## 2011-08-17 MED ORDER — AZITHROMYCIN 250 MG PO TABS: ORAL_TABLET | ORAL | Status: AC

## 2011-08-17 NOTE — Telephone Encounter (Signed)
Sinusitis x 1 wk - bad Z pac

## 2011-09-04 ENCOUNTER — Encounter: Payer: Self-pay | Admitting: Gastroenterology

## 2011-10-12 ENCOUNTER — Encounter: Payer: Self-pay | Admitting: Gastroenterology

## 2011-10-30 ENCOUNTER — Other Ambulatory Visit: Payer: Self-pay | Admitting: Internal Medicine

## 2011-10-30 NOTE — Telephone Encounter (Signed)
Pt last seen 06/07/11, has no future appts on file. Please advise re: refills and f/u:  ALPRAZolam (XANAX) 0.25 MG tablet [Pharmacy Med Name: ALPRAZOLAM 0.25MG  TAB] TAKE ONE TABLET BY MOUTH TWICE DAILY AS NEEDED FOR ANXIETY Disp: 60 each R: 2 Start: 10/30/2011 Class: Normal Requested on: 01/16/2011 Originally ordered on: 09/08/2010 Last refill: 07/16/2011

## 2011-11-05 ENCOUNTER — Telehealth: Payer: Self-pay | Admitting: Gastroenterology

## 2011-11-05 NOTE — Telephone Encounter (Signed)
All questions answered.  Dr Russella Dar recommended propofol on her colon recall.  Patient agrees to proceed with propofol colon as scheduled.

## 2011-11-06 NOTE — Telephone Encounter (Signed)
Pt has called again requesting the status of this Xanax refill request.

## 2011-11-08 NOTE — Telephone Encounter (Signed)
Done. Left detailed mess informing pt.  

## 2011-11-09 ENCOUNTER — Ambulatory Visit (AMBULATORY_SURGERY_CENTER): Payer: BC Managed Care – PPO

## 2011-11-09 VITALS — Ht 70.5 in | Wt 229.5 lb

## 2011-11-09 DIAGNOSIS — Z86010 Personal history of colonic polyps: Secondary | ICD-10-CM

## 2011-11-09 DIAGNOSIS — Z1211 Encounter for screening for malignant neoplasm of colon: Secondary | ICD-10-CM

## 2011-11-09 MED ORDER — PEG-KCL-NACL-NASULF-NA ASC-C 100 G PO SOLR: 1.0000 | Freq: Once | ORAL | Status: AC

## 2011-11-12 ENCOUNTER — Encounter: Payer: Self-pay | Admitting: Gastroenterology

## 2011-11-23 ENCOUNTER — Encounter: Payer: BC Managed Care – PPO | Admitting: Gastroenterology

## 2011-12-20 ENCOUNTER — Ambulatory Visit (AMBULATORY_SURGERY_CENTER): Payer: BC Managed Care – PPO | Admitting: Gastroenterology

## 2011-12-20 ENCOUNTER — Encounter: Payer: Self-pay | Admitting: Gastroenterology

## 2011-12-20 VITALS — BP 141/90 | HR 77 | Temp 97.8°F | Resp 20 | Ht 70.0 in | Wt 229.0 lb

## 2011-12-20 DIAGNOSIS — Z86010 Personal history of colonic polyps: Secondary | ICD-10-CM

## 2011-12-20 DIAGNOSIS — Z1211 Encounter for screening for malignant neoplasm of colon: Secondary | ICD-10-CM

## 2011-12-20 MED ORDER — SODIUM CHLORIDE 0.9 % IV SOLN: 500.0000 mL | INTRAVENOUS | Status: DC

## 2011-12-20 NOTE — Progress Notes (Signed)
Propofol given per [facility] CRNA

## 2011-12-20 NOTE — Patient Instructions (Addendum)

## 2011-12-20 NOTE — Op Note (Signed)
[facility] [address] [place], [place]  [address]  COLONOSCOPY PROCEDURE REPORT  PATIENT:  Courtney, Nielsen  MR#:  [id number] BIRTHDATE:  [birth year], 55 yrs. old  GENDER:  female ENDOSCOPIST:  Judie Petit T. Russella Dar, MD, [facility]  PROCEDURE DATE:  12/20/2011 PROCEDURE:  Colonoscopy 19147 ASA CLASS:  Class II INDICATIONS:  1) surveillance and high-risk screening  2) history of pre-cancerous (adenomatous) colon polyps: 1990, 2008 MEDICATIONS:   MAC sedation, administered by CRNA, propofol (Diprivan) 340 mg IV DESCRIPTION OF PROCEDURE:   After the risks benefits and alternatives of the procedure were thoroughly explained, informed consent was obtained.  Digital rectal exam was performed and revealed no abnormalities.   The LB [id number] endoscope was introduced through the anus and advanced to the cecum, which was identified by both the appendix and ileocecal valve, without limitations.  The quality of the prep was excellent, using MoviPrep.  The instrument was then slowly withdrawn as the colon was fully examined. <<PROCEDUREIMAGES>> FINDINGS:  Moderate diverticulosis was found in the sigmoid to descending colon.  Otherwise normal colonoscopy without other polyps, masses, vascular ectasias, or inflammatory changes. Retroflexed views in the rectum revealed internal hemorrhoids, small.  The time to cecum =  3.33  minutes. The scope was then withdrawn (time =  11  min) from the patient and the procedure completed.  COMPLICATIONS:  None  ENDOSCOPIC IMPRESSION: 1) Moderate diverticulosis in the sigmoid to descending colon 2) Internal hemorrhoids  RECOMMENDATIONS: 1) High fiber diet with liberal fluid intake. 2) Repeat Colonoscopy in 5 years.  Venita Lick. Russella Dar, MD, [facility]  n. eSIGNED:   Venita Lick. Biff Rutigliano at 12/20/2011 10:31 AM  Silvano Rusk, [phone]

## 2011-12-20 NOTE — Progress Notes (Signed)
YOU HAD AN ENDOSCOPIC PROCEDURE TODAY AT THE [facility]: Refer to the procedure report that was given to you for any specific questions about what was found during the examination.  If the procedure report does not answer your questions, please call your gastroenterologist to clarify.  If you requested that your care partner not be given the details of your procedure findings, then the procedure report has been included in a sealed envelope for you to review at your convenience later.  YOU SHOULD EXPECT: Some feelings of bloating in the abdomen. Passage of more gas than usual.  Walking can help get rid of the air that was put into your GI tract during the procedure and reduce the bloating. If you had a lower endoscopy (such as a colonoscopy or flexible sigmoidoscopy) you may notice spotting of blood in your stool or on the toilet paper. If you underwent a bowel prep for your procedure, then you may not have a normal bowel movement for a few days.  DIET: Your first meal following the procedure should be a light meal and then it is ok to progress to your normal diet.  A half-sandwich or bowl of soup is an example of a good first meal.  Heavy or fried foods are harder to digest and may make you feel nauseous or bloated.  Likewise meals heavy in dairy and vegetables can cause extra gas to form and this can also increase the bloating.  Drink plenty of fluids but you should avoid alcoholic beverages for 24 hours.  ACTIVITY: Your care partner should take you home directly after the procedure.  You should plan to take it easy, moving slowly for the rest of the day.  You can resume normal activity the day after the procedure however you should NOT DRIVE or use heavy machinery for 24 hours (because of the sedation medicines used during the test).    SYMPTOMS TO REPORT IMMEDIATELY: A gastroenterologist can be reached at any hour.  During normal business hours, 8:30 AM to 5:00 PM Monday through Friday,  call [phone].  After hours and on weekends, please call the GI answering service at [phone] who will take a message and have the physician on call contact you.   Following lower endoscopy (colonoscopy or flexible sigmoidoscopy):  Excessive amounts of blood in the stool  Significant tenderness or worsening of abdominal pains  Swelling of the abdomen that is new, acute  Fever of 100F or higher  Following upper endoscopy (EGD)  Vomiting of blood or coffee ground material  New chest pain or pain under the shoulder blades  Painful or persistently difficult swallowing  New shortness of breath  Fever of 100F or higher  Black, tarry-looking stools  FOLLOW UP: If any biopsies were taken you will be contacted by phone or by letter within the next 1-3 weeks.  Call your gastroenterologist if you have not heard about the biopsies in 3 weeks.  Our staff will call the home number listed on your records the next business day following your procedure to check on you and address any questions or concerns that you may have at that time regarding the information given to you following your procedure. This is a courtesy call and so if there is no answer at the home number and we have not heard from you through the emergency physician on call, we will assume that you have returned to your regular daily activities without incident.  SIGNATURES/CONFIDENTIALITY: You and/or your care   partner have signed paperwork which will be entered into your electronic medical record.  These signatures attest to the fact that that the information above on your After Visit Summary has been reviewed and is understood.  Full responsibility of the confidentiality of this discharge information lies with you and/or your care-partner.  

## 2011-12-20 NOTE — Progress Notes (Signed)
Patient did not experience any of the following events: a burn prior to discharge; a fall within the facility; wrong site/side/patient/procedure/implant event; or a hospital transfer or hospital admission upon discharge from the facility. (G8907) Patient did not have preoperative order for IV antibiotic SSI prophylaxis. (G8918)  

## 2011-12-21 ENCOUNTER — Telehealth: Payer: Self-pay | Admitting: *Deleted

## 2011-12-21 NOTE — Telephone Encounter (Signed)
  Follow up Call-  Call back number 12/20/2011  Post procedure Call Back phone  # [phone]  Permission to leave phone message Yes     Patient questions:  Do you have a fever, pain , or abdominal swelling? no Pain Score  0 *  Have you tolerated food without any problems? yes  Have you been able to return to your normal activities? yes  Do you have any questions about your discharge instructions: Diet   no Medications  no Follow up visit  no  Do you have questions or concerns about your Care? no  Actions: * If pain score is 4 or above: No action needed, pain <4.

## 2011-12-25 ENCOUNTER — Telehealth: Payer: Self-pay

## 2012-01-01 NOTE — Telephone Encounter (Signed)
  Follow up Call-  Call back number 12/20/2011  Post procedure Call Back phone  # [phone]  Permission to leave phone message Yes     Patient questions:  Do you have a fever, pain , or abdominal swelling? no Pain Score  0 *  Have you tolerated food without any problems? yes  Have you been able to return to your normal activities? yes  Do you have any questions about your discharge instructions: Diet   no Medications  no Follow up visit  no  Do you have questions or concerns about your Care? no   This phone call was in my box, not sure why.  Ardeen Jourdain, RN called the pt back 12/21/11 for the follow up call and the pt had no problems.  I had to complete all the questions in order to complete and remove from my box. Maw    Actions: * If pain score is 4 or above: No action needed, pain <4.  See above note, maw

## 2012-01-11 ENCOUNTER — Other Ambulatory Visit (INDEPENDENT_AMBULATORY_CARE_PROVIDER_SITE_OTHER): Payer: BC Managed Care – PPO

## 2012-01-11 ENCOUNTER — Ambulatory Visit (INDEPENDENT_AMBULATORY_CARE_PROVIDER_SITE_OTHER): Payer: BC Managed Care – PPO | Admitting: Internal Medicine

## 2012-01-11 ENCOUNTER — Encounter: Payer: Self-pay | Admitting: Internal Medicine

## 2012-01-11 VITALS — BP 150/80 | HR 80 | Temp 97.7°F | Resp 16 | Wt 232.0 lb

## 2012-01-11 DIAGNOSIS — R609 Edema, unspecified: Secondary | ICD-10-CM

## 2012-01-11 DIAGNOSIS — R635 Abnormal weight gain: Secondary | ICD-10-CM

## 2012-01-11 DIAGNOSIS — R600 Localized edema: Secondary | ICD-10-CM

## 2012-01-11 DIAGNOSIS — I10 Essential (primary) hypertension: Secondary | ICD-10-CM

## 2012-01-11 DIAGNOSIS — E538 Deficiency of other specified B group vitamins: Secondary | ICD-10-CM

## 2012-01-11 LAB — BASIC METABOLIC PANEL
BUN: 10 mg/dL (ref 6–23)
Calcium: 9.2 mg/dL (ref 8.4–10.5)
GFR: 63.18 mL/min (ref >60.00)
Glucose, Bld: 132 mg/dL — ABNORMAL HIGH (ref 70–99)
Sodium: 141 mEq/L (ref 135–145)

## 2012-01-11 LAB — TSH: TSH: 0.57 u[IU]/mL (ref 0.35–5.50)

## 2012-01-11 LAB — URIC ACID: Uric Acid, Serum: 7.4 mg/dL — ABNORMAL HIGH (ref 2.4–7.0)

## 2012-01-11 MED ORDER — CARVEDILOL 25 MG PO TABS: 25.0000 mg | ORAL_TABLET | Freq: Two times a day (BID) | ORAL | Status: DC

## 2012-01-11 MED ORDER — TRAMADOL HCL 50 MG PO TABS: 50.0000 mg | ORAL_TABLET | Freq: Two times a day (BID) | ORAL | Status: AC | PRN

## 2012-01-11 NOTE — Assessment & Plan Note (Signed)
Continue with current prescription therapy as reflected on the Med list.  

## 2012-01-11 NOTE — Assessment & Plan Note (Signed)
D/c Verapamil Use less Mobic

## 2012-01-12 ENCOUNTER — Encounter: Payer: Self-pay | Admitting: Internal Medicine

## 2012-01-12 NOTE — Progress Notes (Signed)
   Subjective:    Patient ID: Courtney Nielsen, female    DOB: 07/14/1955, 56 y.o.   MRN: [id number]  HPI  C/o  R>L leg swelling and pain x wks. Not better, worse w/wt gain (after she quit smoking). No SOB or cough F/u HTN  Wt Readings from Last 3 Encounters:  01/11/12 232 lb (105.235 kg)  12/20/11 229 lb (103.874 kg)  11/09/11 229 lb 8 oz (104.101 kg)   BP Readings from Last 3 Encounters:  01/11/12 150/80  12/20/11 141/90  06/07/11 112/84      Review of Systems  Constitutional: Positive for unexpected weight change. Negative for chills, activity change, appetite change and fatigue.  HENT: Negative for congestion, mouth sores and sinus pressure.   Eyes: Negative for visual disturbance.  Respiratory: Negative for cough and chest tightness.   Cardiovascular: Positive for leg swelling.  Gastrointestinal: Negative for nausea.  Genitourinary: Negative for frequency, difficulty urinating and vaginal pain.  Musculoskeletal: Negative for back pain and gait problem.  Skin: Negative for pallor.  Neurological: Negative for dizziness, tremors, weakness and numbness.  Psychiatric/Behavioral: Negative for confusion and disturbed wake/sleep cycle. The patient is not nervous/anxious.        Objective:   Physical Exam  Constitutional: She appears well-developed and well-nourished. No distress.  HENT:  Head: Normocephalic.  Right Ear: External ear normal.  Left Ear: External ear normal.  Nose: Nose normal.  Mouth/Throat: Oropharynx is clear and moist.  Eyes: Conjunctivae are normal. Pupils are equal, round, and reactive to light. Right eye exhibits no discharge. Left eye exhibits no discharge.  Neck: Normal range of motion. Neck supple. No JVD present. No tracheal deviation present. No thyromegaly present.  Cardiovascular: Normal rate, regular rhythm and normal heart sounds.   Pulmonary/Chest: No stridor. No respiratory distress. She has no wheezes.  Abdominal: Soft. Bowel sounds are  normal. She exhibits no distension and no mass. There is no tenderness. There is no rebound and no guarding.  Musculoskeletal: She exhibits edema (RLE). She exhibits no tenderness.       1+  Lymphadenopathy:    She has no cervical adenopathy.  Neurological: She displays normal reflexes. No cranial nerve deficit. She exhibits normal muscle tone. Coordination normal.  Skin: No rash noted. No erythema.  Psychiatric: She has a normal mood and affect. Her behavior is normal. Judgment and thought content normal.   Lab Results  Component Value Date   WBC 8.9 04/18/2011   HGB 13.9 04/18/2011   HCT 41.3 04/18/2011   PLT 181.0 04/18/2011   GLUCOSE 132* 01/11/2012   CHOL 192 04/18/2011   TRIG 84.0 04/18/2011   HDL 44.90 04/18/2011   LDLDIRECT 164.6 01/05/2008   LDLCALC 130* 04/18/2011   ALT 17 04/18/2011   AST 18 04/18/2011   NA 141 01/11/2012   K 3.4* 01/11/2012   CL 105 01/11/2012   CREATININE 1.0 01/11/2012   BUN 10 01/11/2012   CO2 24 01/11/2012   TSH 0.57 01/11/2012        Assessment & Plan:

## 2012-01-12 NOTE — Assessment & Plan Note (Signed)
See med change 

## 2012-01-13 ENCOUNTER — Telehealth: Payer: Self-pay | Admitting: Internal Medicine

## 2012-01-13 MED ORDER — POTASSIUM CHLORIDE ER 8 MEQ PO TBCR: 8.0000 meq | EXTENDED_RELEASE_TABLET | Freq: Every day | ORAL | Status: DC

## 2012-01-13 NOTE — Telephone Encounter (Signed)
Courtney Nielsen, please, inform patient that all labs are normal except for a little low potassium and a little elev gout test Start Klor con x 1 mo Thx

## 2012-01-15 ENCOUNTER — Telehealth: Payer: Self-pay | Admitting: Internal Medicine

## 2012-01-15 MED ORDER — DOXAZOSIN MESYLATE 2 MG PO TABS: 2.0000 mg | ORAL_TABLET | Freq: Every day | ORAL | Status: DC

## 2012-01-15 NOTE — Telephone Encounter (Signed)
D/c Carvedilol Start Doxazosyn Thx

## 2012-01-15 NOTE — Telephone Encounter (Signed)
Caller: Courtney Nielsen/Patient; Phone Number: [phone]; Message from caller: Pt.  Was started on Carvedilol 25mg , 1/2 tab bid and to increase to 1 tab on Wednesday bid.  The 1/2 tab is causing severe nausea for 1 1/2 hours after taking each dose.  She is concerned about the increase in the medication and being able to tolerate.

## 2012-01-15 NOTE — Telephone Encounter (Signed)
Left mess for patient to call back.  

## 2012-01-15 NOTE — Telephone Encounter (Signed)
Pt informed of Rx change/pharmacy 

## 2012-01-21 NOTE — Telephone Encounter (Signed)
Left mess for patient to call back.  

## 2012-01-22 NOTE — Telephone Encounter (Signed)
Pt advised and states that she had already picked up and started medication.

## 2012-02-22 ENCOUNTER — Encounter: Payer: Self-pay | Admitting: Internal Medicine

## 2012-02-22 ENCOUNTER — Other Ambulatory Visit: Payer: Self-pay | Admitting: *Deleted

## 2012-02-22 ENCOUNTER — Ambulatory Visit (INDEPENDENT_AMBULATORY_CARE_PROVIDER_SITE_OTHER): Payer: BC Managed Care – PPO | Admitting: Internal Medicine

## 2012-02-22 VITALS — BP 110/70 | HR 84 | Temp 97.9°F | Resp 16 | Wt 228.0 lb

## 2012-02-22 DIAGNOSIS — M79609 Pain in unspecified limb: Secondary | ICD-10-CM

## 2012-02-22 DIAGNOSIS — R609 Edema, unspecified: Secondary | ICD-10-CM

## 2012-02-22 DIAGNOSIS — I10 Essential (primary) hypertension: Secondary | ICD-10-CM

## 2012-02-22 DIAGNOSIS — R600 Localized edema: Secondary | ICD-10-CM

## 2012-02-22 DIAGNOSIS — R202 Paresthesia of skin: Secondary | ICD-10-CM

## 2012-02-22 DIAGNOSIS — M109 Gout, unspecified: Secondary | ICD-10-CM

## 2012-02-22 DIAGNOSIS — R209 Unspecified disturbances of skin sensation: Secondary | ICD-10-CM

## 2012-02-22 DIAGNOSIS — R635 Abnormal weight gain: Secondary | ICD-10-CM

## 2012-02-22 DIAGNOSIS — M79606 Pain in leg, unspecified: Secondary | ICD-10-CM | POA: Insufficient documentation

## 2012-02-22 DIAGNOSIS — E538 Deficiency of other specified B group vitamins: Secondary | ICD-10-CM

## 2012-02-22 MED ORDER — MELOXICAM 15 MG PO TABS: 15.0000 mg | ORAL_TABLET | Freq: Every day | ORAL | Status: DC | PRN

## 2012-02-22 MED ORDER — DICLOFENAC SODIUM 1.5 % TD SOLN: 5.0000 [drp] | Freq: Four times a day (QID) | TRANSDERMAL | Status: DC | PRN

## 2012-02-22 NOTE — Progress Notes (Signed)
Patient ID: Courtney Nielsen, female   DOB: [birth year], 56 y.o.   MRN: [id number]   Subjective:    Patient ID: Courtney Nielsen, female    DOB: 07-26-1955, 56 y.o.   MRN: [id number]  HPI  C/o  R>>L leg swelling - better and pain is not better.  No SOB or cough F/u HTN  Wt Readings from Last 3 Encounters:  02/22/12 228 lb (103.42 kg)  01/11/12 232 lb (105.235 kg)  12/20/11 229 lb (103.874 kg)   BP Readings from Last 3 Encounters:  02/22/12 110/70  01/11/12 150/80  12/20/11 141/90      Review of Systems  Constitutional: Positive for unexpected weight change. Negative for chills, activity change, appetite change and fatigue.  HENT: Negative for congestion, mouth sores and sinus pressure.   Eyes: Negative for visual disturbance.  Respiratory: Negative for cough and chest tightness.   Cardiovascular: Positive for leg swelling.  Gastrointestinal: Negative for nausea.  Genitourinary: Negative for frequency, difficulty urinating and vaginal pain.  Musculoskeletal: Negative for back pain and gait problem.  Skin: Negative for pallor.  Neurological: Negative for dizziness, tremors, weakness and numbness.  Psychiatric/Behavioral: Negative for confusion and disturbed wake/sleep cycle. The patient is not nervous/anxious.        Objective:   Physical Exam  Constitutional: She appears well-developed and well-nourished. No distress.  HENT:  Head: Normocephalic.  Right Ear: External ear normal.  Left Ear: External ear normal.  Nose: Nose normal.  Mouth/Throat: Oropharynx is clear and moist.  Eyes: Conjunctivae are normal. Pupils are equal, round, and reactive to light. Right eye exhibits no discharge. Left eye exhibits no discharge.  Neck: Normal range of motion. Neck supple. No JVD present. No tracheal deviation present. No thyromegaly present.  Cardiovascular: Normal rate, regular rhythm and normal heart sounds.   Pulmonary/Chest: No stridor. No respiratory distress. She has no  wheezes.  Abdominal: Soft. Bowel sounds are normal. She exhibits no distension and no mass. There is no tenderness. There is no rebound and no guarding.  Musculoskeletal: She exhibits edema (RLE). She exhibits no tenderness.       1+  Lymphadenopathy:    She has no cervical adenopathy.  Neurological: She displays normal reflexes. No cranial nerve deficit. She exhibits normal muscle tone. Coordination normal.  Skin: No rash noted. No erythema.  Psychiatric: She has a normal mood and affect. Her behavior is normal. Judgment and thought content normal.   Lab Results  Component Value Date   WBC 8.9 04/18/2011   HGB 13.9 04/18/2011   HCT 41.3 04/18/2011   PLT 181.0 04/18/2011   GLUCOSE 132* 01/11/2012   CHOL 192 04/18/2011   TRIG 84.0 04/18/2011   HDL 44.90 04/18/2011   LDLDIRECT 164.6 01/05/2008   LDLCALC 130* 04/18/2011   ALT 17 04/18/2011   AST 18 04/18/2011   NA 141 01/11/2012   K 3.4* 01/11/2012   CL 105 01/11/2012   CREATININE 1.0 01/11/2012   BUN 10 01/11/2012   CO2 24 01/11/2012   TSH 0.57 01/11/2012        Assessment & Plan:

## 2012-02-22 NOTE — Assessment & Plan Note (Signed)
R>>L -- lat ankle and foot, paresthesia 8/13 R/o tarsal tunnel [place] [facility] consult

## 2012-02-22 NOTE — Assessment & Plan Note (Signed)
Better  

## 2012-02-22 NOTE — Assessment & Plan Note (Signed)
We can try colchicine prn, however I do not see sx's of gout at present

## 2012-02-22 NOTE — Assessment & Plan Note (Signed)
RLE 12.12  Doppler US was OK 7/13 likely due to wt gain and Verapamil, NSAIDs  Better off meds

## 2012-02-22 NOTE — Assessment & Plan Note (Signed)
Continue with current prescription therapy as reflected on the Med list.  

## 2012-02-25 ENCOUNTER — Other Ambulatory Visit: Payer: Self-pay | Admitting: *Deleted

## 2012-02-25 MED ORDER — MELOXICAM 15 MG PO TABS: 15.0000 mg | ORAL_TABLET | Freq: Every day | ORAL | Status: DC | PRN

## 2012-02-26 ENCOUNTER — Telehealth: Payer: Self-pay | Admitting: Internal Medicine

## 2012-02-26 NOTE — Telephone Encounter (Signed)
Caller: [name]/Patient; Patient Name: Courtney Nielsen; PCP: Illene Regulus (Adults only); Best Callback Phone Number: [phone] Man is calling about being told not take the Mobic but Medication ordered on 02/25/12. She is still having aching and tingling in R foot at night and still getting pain in knees and legs. She is using topical cream for painful joints and helps some. Triage per Foot Non-Injury Protocol and advised to be checked within 24 hours. She has appnt next week with Orthopedist. Please ask Dr. Posey Rea if he would like her to continue taking the Mobic. She has some left from last prescription but does not want to take it since caused foot sx to worsen.

## 2012-02-27 NOTE — Telephone Encounter (Signed)
Pt informed

## 2012-02-27 NOTE — Telephone Encounter (Signed)
Ok to try mobic qod Thx

## 2012-03-05 ENCOUNTER — Other Ambulatory Visit: Payer: Self-pay | Admitting: *Deleted

## 2012-03-05 MED ORDER — DOXAZOSIN MESYLATE 2 MG PO TABS: 2.0000 mg | ORAL_TABLET | Freq: Every day | ORAL | Status: DC

## 2012-04-17 ENCOUNTER — Other Ambulatory Visit: Payer: Self-pay | Admitting: *Deleted

## 2012-04-17 ENCOUNTER — Other Ambulatory Visit (INDEPENDENT_AMBULATORY_CARE_PROVIDER_SITE_OTHER): Payer: BC Managed Care – PPO

## 2012-04-17 DIAGNOSIS — Z0000 Encounter for general adult medical examination without abnormal findings: Secondary | ICD-10-CM

## 2012-04-17 LAB — URINALYSIS, ROUTINE W REFLEX MICROSCOPIC
Bilirubin Urine: NEGATIVE
Hgb urine dipstick: NEGATIVE
Total Protein, Urine: NEGATIVE
Urine Glucose: NEGATIVE

## 2012-04-17 LAB — BASIC METABOLIC PANEL
BUN: 11 mg/dL (ref 6–23)
Chloride: 109 mEq/L (ref 96–112)
Potassium: 3.8 mEq/L (ref 3.5–5.1)

## 2012-04-17 LAB — CBC WITH DIFFERENTIAL/PLATELET
Eosinophils Absolute: 0.2 10*3/uL (ref 0.0–0.7)
Eosinophils Relative: 3.5 % (ref 0.0–5.0)
HCT: 40.3 % (ref 36.0–46.0)
Lymphs Abs: 2.5 10*3/uL (ref 0.7–4.0)
MCHC: 32.9 g/dL (ref 30.0–36.0)
MCV: 95.2 fl (ref 78.0–100.0)
Monocytes Absolute: 0.5 10*3/uL (ref 0.1–1.0)
Platelets: 177 10*3/uL (ref 150.0–400.0)
RDW: 13 % (ref 11.5–14.6)

## 2012-04-17 LAB — HEPATIC FUNCTION PANEL
ALT: 35 U/L (ref 0–35)
AST: 21 U/L (ref 0–37)
Bilirubin, Direct: 0.1 mg/dL (ref 0.0–0.3)
Total Bilirubin: 0.8 mg/dL (ref 0.3–1.2)

## 2012-04-17 LAB — TSH: TSH: 0.73 u[IU]/mL (ref 0.35–5.50)

## 2012-04-17 LAB — LIPID PANEL
Cholesterol: 171 mg/dL (ref 0–200)
LDL Cholesterol: 112 mg/dL — ABNORMAL HIGH (ref 0–99)

## 2012-04-23 ENCOUNTER — Encounter: Payer: Self-pay | Admitting: Internal Medicine

## 2012-04-23 ENCOUNTER — Ambulatory Visit (INDEPENDENT_AMBULATORY_CARE_PROVIDER_SITE_OTHER): Payer: BC Managed Care – PPO | Admitting: Internal Medicine

## 2012-04-23 VITALS — BP 140/100 | HR 80 | Temp 98.7°F | Resp 16 | Ht 70.5 in | Wt 228.0 lb

## 2012-04-23 DIAGNOSIS — E538 Deficiency of other specified B group vitamins: Secondary | ICD-10-CM

## 2012-04-23 DIAGNOSIS — M109 Gout, unspecified: Secondary | ICD-10-CM

## 2012-04-23 DIAGNOSIS — Z0000 Encounter for general adult medical examination without abnormal findings: Secondary | ICD-10-CM

## 2012-04-23 DIAGNOSIS — R609 Edema, unspecified: Secondary | ICD-10-CM

## 2012-04-23 DIAGNOSIS — R600 Localized edema: Secondary | ICD-10-CM

## 2012-04-23 DIAGNOSIS — J309 Allergic rhinitis, unspecified: Secondary | ICD-10-CM

## 2012-04-23 DIAGNOSIS — R635 Abnormal weight gain: Secondary | ICD-10-CM

## 2012-04-23 DIAGNOSIS — K219 Gastro-esophageal reflux disease without esophagitis: Secondary | ICD-10-CM

## 2012-04-23 DIAGNOSIS — I10 Essential (primary) hypertension: Secondary | ICD-10-CM

## 2012-04-23 DIAGNOSIS — F17200 Nicotine dependence, unspecified, uncomplicated: Secondary | ICD-10-CM

## 2012-04-23 MED ORDER — VERAPAMIL HCL ER 240 MG PO TBCR: 240.0000 mg | EXTENDED_RELEASE_TABLET | Freq: Every day | ORAL | Status: DC

## 2012-04-23 MED ORDER — TRAMADOL HCL 50 MG PO TABS: 50.0000 mg | ORAL_TABLET | Freq: Two times a day (BID) | ORAL | Status: DC | PRN

## 2012-04-23 NOTE — Assessment & Plan Note (Signed)
Continue with current prescription therapy as reflected on the Med list.  

## 2012-04-23 NOTE — Assessment & Plan Note (Signed)
Quit in 2012 

## 2012-04-23 NOTE — Assessment & Plan Note (Signed)
Wt Readings from Last 3 Encounters:  04/23/12 228 lb (103.42 kg)  02/22/12 228 lb (103.42 kg)  01/11/12 232 lb (105.235 kg)

## 2012-04-23 NOTE — Progress Notes (Signed)
Subjective:    Patient ID: Courtney Nielsen, female    DOB: [birth year], 56 y.o.   MRN: [id number]  HPI  The patient is here for a wellness exam. The patient has been doing well overall without major physical or psychological issues going on lately. She was dx'd w/a RLL pneumonia 4 d ago. C/o itching at night after a second dose. The patient needs to address  chronic hypertension and COPD (worse).  Review of Systems  Constitutional: Negative.  Negative for fever, chills, diaphoresis, activity change, appetite change, fatigue and unexpected weight change.  HENT: Positive for congestion. Negative for hearing loss, ear pain, nosebleeds, sore throat, facial swelling, rhinorrhea, sneezing, mouth sores, trouble swallowing, neck pain, neck stiffness, postnasal drip, sinus pressure and tinnitus.   Eyes: Negative for pain, discharge, redness, itching and visual disturbance.  Respiratory: Positive for cough, chest tightness and shortness of breath. Negative for wheezing and stridor.   Cardiovascular: Negative for chest pain, palpitations and leg swelling.  Gastrointestinal: Negative for nausea, diarrhea, constipation, blood in stool, abdominal distention, anal bleeding and rectal pain.  Genitourinary: Negative for dysuria, urgency, frequency, hematuria, flank pain, vaginal bleeding, vaginal discharge, difficulty urinating, genital sores and pelvic pain.  Musculoskeletal: Negative for back pain, joint swelling, arthralgias and gait problem.  Skin: Negative.  Negative for rash.  Neurological: Negative for dizziness, tremors, seizures, syncope, speech difficulty, weakness, numbness and headaches.  Hematological: Negative for adenopathy. Does not bruise/bleed easily.  Psychiatric/Behavioral: Negative for suicidal ideas, behavioral problems, disturbed wake/sleep cycle, dysphoric mood and decreased concentration. The patient is not nervous/anxious.        Objective:   Physical Exam  Constitutional: She  appears well-developed. No distress.       Dyspneic w/act  HENT:  Head: Normocephalic.  Right Ear: External ear normal.  Left Ear: External ear normal.  Nose: Nose normal.  Mouth/Throat: Oropharynx is clear and moist.  Eyes: Conjunctivae normal are normal. Pupils are equal, round, and reactive to light. Right eye exhibits no discharge. Left eye exhibits no discharge.  Neck: Normal range of motion. Neck supple. No JVD present. No tracheal deviation present. No thyromegaly present.  Cardiovascular: Normal rate, regular rhythm and normal heart sounds.  Exam reveals no gallop and no friction rub.   No murmur heard.      Tachy   Pulmonary/Chest: No stridor. No respiratory distress. She has no wheezes. She has rales (RLL).  Abdominal: Soft. Bowel sounds are normal. She exhibits no distension and no mass. There is no tenderness. There is no rebound and no guarding.  Musculoskeletal: She exhibits no edema and no tenderness.  Lymphadenopathy:    She has no cervical adenopathy.  Neurological: She displays normal reflexes. No cranial nerve deficit. She exhibits normal muscle tone. Coordination normal.  Skin: No rash noted. No erythema.  Psychiatric: She has a normal mood and affect. Her behavior is normal. Judgment and thought content normal.   02 sat was 87% briefly after walking - then 90-94%   Lab Results  Component Value Date   WBC 6.3 04/17/2012   HGB 13.3 04/17/2012   HCT 40.3 04/17/2012   PLT 177.0 04/17/2012   GLUCOSE 90 04/17/2012   CHOL 171 04/17/2012   TRIG 101.0 04/17/2012   HDL 39.20 04/17/2012   LDLDIRECT 164.6 01/05/2008   LDLCALC 112* 04/17/2012   ALT 35 04/17/2012   AST 21 04/17/2012   NA 142 04/17/2012   K 3.8 04/17/2012   CL 109 04/17/2012   CREATININE 1.0  04/17/2012   BUN 11 04/17/2012   CO2 27 04/17/2012   TSH 0.73 04/17/2012       Assessment & Plan:

## 2012-04-23 NOTE — Assessment & Plan Note (Signed)
Better  

## 2012-05-27 ENCOUNTER — Other Ambulatory Visit: Payer: Self-pay | Admitting: *Deleted

## 2012-05-27 MED ORDER — VERAPAMIL HCL ER 240 MG PO TBCR: 240.0000 mg | EXTENDED_RELEASE_TABLET | Freq: Every day | ORAL | Status: DC

## 2012-05-27 MED ORDER — LOSARTAN POTASSIUM 100 MG PO TABS: 100.0000 mg | ORAL_TABLET | Freq: Every day | ORAL | Status: DC

## 2012-07-08 ENCOUNTER — Other Ambulatory Visit: Payer: Self-pay | Admitting: Internal Medicine

## 2012-07-14 ENCOUNTER — Ambulatory Visit (INDEPENDENT_AMBULATORY_CARE_PROVIDER_SITE_OTHER): Payer: BC Managed Care – PPO | Admitting: Internal Medicine

## 2012-07-14 ENCOUNTER — Encounter: Payer: Self-pay | Admitting: Internal Medicine

## 2012-07-14 VITALS — BP 130/80 | HR 81 | Temp 98.3°F | Wt 222.0 lb

## 2012-07-14 DIAGNOSIS — J449 Chronic obstructive pulmonary disease, unspecified: Secondary | ICD-10-CM

## 2012-07-14 DIAGNOSIS — E538 Deficiency of other specified B group vitamins: Secondary | ICD-10-CM

## 2012-07-14 DIAGNOSIS — I10 Essential (primary) hypertension: Secondary | ICD-10-CM

## 2012-07-14 MED ORDER — PROMETHAZINE-CODEINE 6.25-10 MG/5ML PO SYRP
5.0000 mL | ORAL_SOLUTION | ORAL | Status: DC | PRN
Start: 2012-07-14 — End: 2012-10-22

## 2012-07-14 MED ORDER — METHYLPREDNISOLONE ACETATE 80 MG/ML IJ SUSP
120.0000 mg | Freq: Once | INTRAMUSCULAR | Status: AC
  Administered 2012-07-14: 120 mg via INTRAMUSCULAR

## 2012-07-14 MED ORDER — FLUTICASONE FUROATE-VILANTEROL 100-25 MCG/INH IN AEPB: 1.0000 | INHALATION_SPRAY | Freq: Every day | RESPIRATORY_TRACT | Status: DC

## 2012-07-14 MED ORDER — ALPRAZOLAM 0.25 MG PO TABS: 0.2500 mg | ORAL_TABLET | Freq: Two times a day (BID) | ORAL | Status: DC

## 2012-07-14 MED ORDER — DOXYCYCLINE HYCLATE 100 MG PO TABS: 100.0000 mg | ORAL_TABLET | Freq: Two times a day (BID) | ORAL | Status: DC

## 2012-07-14 NOTE — Progress Notes (Signed)
   Subjective:    Cough This is a new problem. The current episode started in the past 7 days. The problem has been gradually worsening. The problem occurs every few minutes. The cough is productive of purulent sputum. Pertinent negatives include no chills or rash. The treatment provided no relief. Her past medical history is significant for COPD.     F/u HTN  Wt Readings from Last 3 Encounters:  07/14/12 222 lb (100.699 kg)  04/23/12 228 lb (103.42 kg)  02/22/12 228 lb (103.42 kg)   BP Readings from Last 3 Encounters:  07/14/12 130/80  04/23/12 140/100  02/22/12 110/70      Review of Systems  Constitutional: Positive for unexpected weight change. Negative for chills, activity change, appetite change and fatigue.  HENT: Negative for congestion, mouth sores and sinus pressure.   Eyes: Negative for visual disturbance.  Respiratory: Positive for cough. Negative for chest tightness.   Cardiovascular: Positive for leg swelling.  Gastrointestinal: Negative for nausea.  Genitourinary: Negative for frequency, difficulty urinating and vaginal pain.  Musculoskeletal: Negative for back pain and gait problem.  Skin: Negative for pallor and rash.  Neurological: Negative for dizziness, tremors, weakness and numbness.  Psychiatric/Behavioral: Negative for confusion and sleep disturbance. The patient is not nervous/anxious.        Objective:   Physical Exam  Constitutional: She appears well-developed and well-nourished. No distress.  HENT:  Head: Normocephalic.  Right Ear: External ear normal.  Left Ear: External ear normal.  Nose: Nose normal.  Mouth/Throat: Oropharynx is clear and moist.  Eyes: Conjunctivae normal are normal. Pupils are equal, round, and reactive to light. Right eye exhibits no discharge. Left eye exhibits no discharge.  Neck: Normal range of motion. Neck supple. No JVD present. No tracheal deviation present. No thyromegaly present.  Cardiovascular: Normal rate,  regular rhythm and normal heart sounds.   Pulmonary/Chest: No stridor. No respiratory distress. She has no wheezes.  Abdominal: Soft. Bowel sounds are normal. She exhibits no distension and no mass. There is no tenderness. There is no rebound and no guarding.  Musculoskeletal: She exhibits edema (RLE). She exhibits no tenderness.       1+  Lymphadenopathy:    She has no cervical adenopathy.  Neurological: She displays normal reflexes. No cranial nerve deficit. She exhibits normal muscle tone. Coordination normal.  Skin: No rash noted. No erythema.  Psychiatric: She has a normal mood and affect. Her behavior is normal. Judgment and thought content normal.   Lab Results  Component Value Date   WBC 6.3 04/17/2012   HGB 13.3 04/17/2012   HCT 40.3 04/17/2012   PLT 177.0 04/17/2012   GLUCOSE 90 04/17/2012   CHOL 171 04/17/2012   TRIG 101.0 04/17/2012   HDL 39.20 04/17/2012   LDLDIRECT 164.6 01/05/2008   LDLCALC 112* 04/17/2012   ALT 35 04/17/2012   AST 21 04/17/2012   NA 142 04/17/2012   K 3.8 04/17/2012   CL 109 04/17/2012   CREATININE 1.0 04/17/2012   BUN 11 04/17/2012   CO2 27 04/17/2012   TSH 0.73 04/17/2012        Assessment & Plan:

## 2012-07-15 NOTE — Assessment & Plan Note (Signed)
Continue with current prescription therapy as reflected on the Med list.  

## 2012-07-15 NOTE — Assessment & Plan Note (Signed)
Depomedrol 120 mg IM Breo inhaler

## 2012-10-22 ENCOUNTER — Ambulatory Visit (INDEPENDENT_AMBULATORY_CARE_PROVIDER_SITE_OTHER)
Admission: RE | Admit: 2012-10-22 | Discharge: 2012-10-22 | Disposition: A | Discharge status: Home / Self Care | Payer: BC Managed Care – PPO | Source: Ambulatory Visit | Attending: Internal Medicine | Admitting: Internal Medicine

## 2012-10-22 ENCOUNTER — Ambulatory Visit: Payer: BC Managed Care – PPO | Admitting: Internal Medicine

## 2012-10-22 ENCOUNTER — Encounter: Payer: Self-pay | Admitting: Internal Medicine

## 2012-10-22 ENCOUNTER — Ambulatory Visit (INDEPENDENT_AMBULATORY_CARE_PROVIDER_SITE_OTHER): Payer: BC Managed Care – PPO | Admitting: Internal Medicine

## 2012-10-22 ENCOUNTER — Other Ambulatory Visit (INDEPENDENT_AMBULATORY_CARE_PROVIDER_SITE_OTHER): Payer: BC Managed Care – PPO

## 2012-10-22 VITALS — BP 132/82 | HR 84 | Temp 98.2°F | Resp 16 | Wt 230.0 lb

## 2012-10-22 DIAGNOSIS — M79671 Pain in right foot: Secondary | ICD-10-CM

## 2012-10-22 DIAGNOSIS — M79609 Pain in unspecified limb: Secondary | ICD-10-CM

## 2012-10-22 LAB — BASIC METABOLIC PANEL
BUN: 9 mg/dL (ref 6–23)
CO2: 25 mEq/L (ref 19–32)
Chloride: 106 mEq/L (ref 96–112)
Creatinine, Ser: 1 mg/dL (ref 0.4–1.2)
Potassium: 3.5 mEq/L (ref 3.5–5.1)

## 2012-10-22 MED ORDER — TRAMADOL HCL 50 MG PO TABS: 50.0000 mg | ORAL_TABLET | Freq: Two times a day (BID) | ORAL | Status: DC | PRN

## 2012-10-22 MED ORDER — INDOMETHACIN 50 MG PO CAPS: 50.0000 mg | ORAL_CAPSULE | Freq: Three times a day (TID) | ORAL | Status: DC | PRN

## 2012-10-22 MED ORDER — ALPRAZOLAM 0.5 MG PO TABS: 0.5000 mg | ORAL_TABLET | Freq: Two times a day (BID) | ORAL | Status: DC | PRN

## 2012-10-22 NOTE — Progress Notes (Signed)
   Subjective:    Cough This is a new problem. The current episode started in the past 7 days. The problem has been gradually worsening. The problem occurs every few minutes. The cough is productive of purulent sputum. Pertinent negatives include no chills or rash. The treatment provided no relief. Her past medical history is significant for COPD.   C/o R foot swelling and pain x 3 d    F/u HTN  Wt Readings from Last 3 Encounters:  10/22/12 230 lb (104.327 kg)  07/14/12 222 lb (100.699 kg)  04/23/12 228 lb (103.42 kg)   BP Readings from Last 3 Encounters:  10/22/12 132/82  07/14/12 130/80  04/23/12 140/100      Review of Systems  Constitutional: Positive for unexpected weight change. Negative for chills, activity change, appetite change and fatigue.  HENT: Negative for congestion, mouth sores and sinus pressure.   Eyes: Negative for visual disturbance.  Respiratory: Positive for cough. Negative for chest tightness.   Cardiovascular: Positive for leg swelling.  Gastrointestinal: Negative for nausea.  Genitourinary: Negative for frequency, difficulty urinating and vaginal pain.  Musculoskeletal: Negative for back pain and gait problem.  Skin: Negative for pallor and rash.  Neurological: Negative for dizziness, tremors, weakness and numbness.  Psychiatric/Behavioral: Negative for confusion and sleep disturbance. The patient is not nervous/anxious.        Objective:   Physical Exam  Constitutional: She appears well-developed and well-nourished. No distress.  HENT:  Head: Normocephalic.  Right Ear: External ear normal.  Left Ear: External ear normal.  Nose: Nose normal.  Mouth/Throat: Oropharynx is clear and moist.  Eyes: Conjunctivae are normal. Pupils are equal, round, and reactive to light. Right eye exhibits no discharge. Left eye exhibits no discharge.  Neck: Normal range of motion. Neck supple. No JVD present. No tracheal deviation present. No thyromegaly present.   Cardiovascular: Normal rate, regular rhythm and normal heart sounds.   Pulmonary/Chest: No stridor. No respiratory distress. She has no wheezes.  Abdominal: Soft. Bowel sounds are normal. She exhibits no distension and no mass. There is no tenderness. There is no rebound and no guarding.  Musculoskeletal: She exhibits edema (RLE). She exhibits no tenderness.  1+  Lymphadenopathy:    She has no cervical adenopathy.  Neurological: She displays normal reflexes. No cranial nerve deficit. She exhibits normal muscle tone. Coordination normal.  Skin: No rash noted. No erythema.  Psychiatric: She has a normal mood and affect. Her behavior is normal. Judgment and thought content normal.   Lab Results  Component Value Date   WBC 6.3 04/17/2012   HGB 13.3 04/17/2012   HCT 40.3 04/17/2012   PLT 177.0 04/17/2012   GLUCOSE 90 04/17/2012   CHOL 171 04/17/2012   TRIG 101.0 04/17/2012   HDL 39.20 04/17/2012   LDLDIRECT 164.6 01/05/2008   LDLCALC 112* 04/17/2012   ALT 35 04/17/2012   AST 21 04/17/2012   NA 142 04/17/2012   K 3.8 04/17/2012   CL 109 04/17/2012   CREATININE 1.0 04/17/2012   BUN 11 04/17/2012   CO2 27 04/17/2012   TSH 0.73 04/17/2012        Assessment & Plan:

## 2012-10-23 ENCOUNTER — Ambulatory Visit: Payer: BC Managed Care – PPO | Admitting: Internal Medicine

## 2012-10-23 ENCOUNTER — Encounter: Payer: Self-pay | Admitting: Internal Medicine

## 2012-10-24 ENCOUNTER — Other Ambulatory Visit: Payer: Self-pay | Admitting: Internal Medicine

## 2012-10-24 NOTE — Telephone Encounter (Signed)
1. We can try Prednisone in place of Indocin - pls call in Prednisone 10 mg: take 4 tabs a day x 3 days; then 3 tabs a day x 4 days; then 2 tabs a day x 4 days, then 1 tab a day x 6 days, then stop. Take pc. 2. We can have her to see a foot specialist - Podiatry ref Thx

## 2012-10-24 NOTE — Telephone Encounter (Signed)
Caller: Courtney Nielsen/Patient; Phone: [phone]; Reason for Call: Patient calling in follow up to visit 10/22/12.  Seen for right foot pain.  Xray showed no fracture; uric acid levels normal.  Wants to know what next steps necessary to deal with this pain.  States pain is not improved at all; declines new triage.  Email has been left in MyChart for provider review.  Info to office for provider review/callback.  May reach patient at [phone] until 1230 10/24/12 and then cell [phone].  Krs/can

## 2012-10-27 MED ORDER — PREDNISONE 10 MG PO TABS: ORAL_TABLET | ORAL | Status: DC

## 2012-10-27 NOTE — Telephone Encounter (Signed)
Notified pt with md response. Rx fax to [facility]...Raechel Chute

## 2012-10-28 ENCOUNTER — Telehealth: Payer: Self-pay | Admitting: Internal Medicine

## 2012-10-28 DIAGNOSIS — M25579 Pain in unspecified ankle and joints of unspecified foot: Secondary | ICD-10-CM

## 2012-10-28 NOTE — Telephone Encounter (Signed)
Pt is needing a referral to see podiatry. See previous msg from dr. Posey Rea...Raechel Chute

## 2012-10-28 NOTE — Telephone Encounter (Signed)
Done per emr 

## 2012-10-28 NOTE — Telephone Encounter (Signed)
Patient Information:  Caller Name: Ariellah  Phone: [phone]  Patient: Courtney Nielsen, Courtney Nielsen  Gender: Female  DOB: [birth year]  Age: 57 Years  PCP: Plotnikov, Alex (Adults only)  Office Follow Up:  Does the office need to follow up with this patient?: Yes  Instructions For The Office: POSSIBLE MEDICATION REACTION  RN Note:  Please contact patient medication reaction.  Advised to NOT TAKE any more of the medication until contacted by provider  Symptoms  Reason For Call & Symptoms: Patient was placed on Prednisone for foot pain/issues yesterday 10/27/12.  She took the first pill last night x4 at 19:00pm.  She reports she noticed increased Urination during the night x4 and intense itching "all over".  She denies rash or bumps.  Very nervous and anxious.  She took Benadryl x1 at 07:00 and it has calmed the itching  Reviewed Health History In EMR: Yes  Reviewed Medications In EMR: Yes  Reviewed Allergies In EMR: Yes  Reviewed Surgeries / Procedures: Yes  Date of Onset of Symptoms: 10/27/2012  Treatments Tried: Benadryl  Treatments Tried Worked: Yes  Guideline(s) Used:  Itching - Widespread  Disposition Per Guideline:   Discuss with PCP and Callback by Nurse Today  Reason For Disposition Reached:   Taking prescription medication that could cause itching (e.g., codeine/morphine/other opiates, aspirin)  Advice Given:  Reassurance - Itching of Unknown Cause:  The most common cause of itching is dry skin. Itching can also be caused by soaps, chlorine, low humidity, and pollen or other irritants. Sometimes the cause is unknown.  With a few simple measures, the itching will usually get better in 1 to 2 days.  Here is some care advice that should help.  Avoid Triggers:  Avoid hot showers and baths. (Reason: heat increases itching.)  Avoid itchy or tight clothing (especially wool).  Avoid sleeping with too many blankets. (Reason: heat and sweating aggravates itching.)  Avoid Soaps:  Avoid all strong soaps (including bubble bath and scented soaps). (Reason: soaps remove natural oils from the skin.) Use gentler soaps like Dove, Olay, or Basis.  Adults only need soap for washing the armpits, groin, and feet.  Don  Try not to scratch.  Itching is often worsened by scratching (the "Itch-Scratch" cycle).  Cut the fingernails short. (Reason: prevent secondary bacterial infection.)  Cool Bath For Flare-Up:  For flare-ups of itching, take a cool bath without soap for 15 minutes, 1-2 times per day. Pat dry using towel - do not rub.  Another option is an Insurance account manager Risk manager) [employer]. Sprinkle contents of one packet under running faucet. (Caution: this can make bathtub slippery.)  Oral Antihistamine Medication for Itching:  Take an antihistamine by mouth to reduce the itching. Diphenhydramine (Benadryl) is available over-the-counter. Adult dose is 25-50 mg. Take it up to 4 times a day.  Call Back If:  Rash occurs  Itching becomes worse or lasts over 48 hours  You become worse.  Patient Will Follow Care Advice:  YES

## 2012-11-27 ENCOUNTER — Other Ambulatory Visit: Payer: Self-pay | Admitting: Obstetrics and Gynecology

## 2012-11-27 DIAGNOSIS — R928 Other abnormal and inconclusive findings on diagnostic imaging of breast: Secondary | ICD-10-CM

## 2012-12-05 ENCOUNTER — Other Ambulatory Visit: Payer: Self-pay | Admitting: Obstetrics and Gynecology

## 2012-12-05 ENCOUNTER — Ambulatory Visit
Admission: RE | Admit: 2012-12-05 | Discharge: 2012-12-05 | Disposition: A | Discharge status: Home / Self Care | Payer: BC Managed Care – PPO | Source: Ambulatory Visit | Attending: Obstetrics and Gynecology | Admitting: Obstetrics and Gynecology

## 2012-12-05 DIAGNOSIS — R928 Other abnormal and inconclusive findings on diagnostic imaging of breast: Secondary | ICD-10-CM

## 2012-12-10 ENCOUNTER — Other Ambulatory Visit: Payer: Self-pay | Admitting: Obstetrics and Gynecology

## 2012-12-10 ENCOUNTER — Ambulatory Visit
Admission: RE | Admit: 2012-12-10 | Discharge: 2012-12-10 | Disposition: A | Discharge status: Home / Self Care | Payer: BC Managed Care – PPO | Source: Ambulatory Visit | Attending: Obstetrics and Gynecology | Admitting: Obstetrics and Gynecology

## 2012-12-10 DIAGNOSIS — R928 Other abnormal and inconclusive findings on diagnostic imaging of breast: Secondary | ICD-10-CM

## 2012-12-11 ENCOUNTER — Other Ambulatory Visit: Payer: BC Managed Care – PPO

## 2013-02-20 ENCOUNTER — Encounter: Payer: Self-pay | Admitting: Internal Medicine

## 2013-02-20 ENCOUNTER — Ambulatory Visit (INDEPENDENT_AMBULATORY_CARE_PROVIDER_SITE_OTHER): Payer: BC Managed Care – PPO | Admitting: Internal Medicine

## 2013-02-20 VITALS — BP 120/80 | HR 80 | Temp 99.5°F | Resp 16 | Wt 235.0 lb

## 2013-02-20 DIAGNOSIS — M545 Low back pain, unspecified: Secondary | ICD-10-CM

## 2013-02-20 DIAGNOSIS — H6120 Impacted cerumen, unspecified ear: Secondary | ICD-10-CM

## 2013-02-20 DIAGNOSIS — E538 Deficiency of other specified B group vitamins: Secondary | ICD-10-CM

## 2013-02-20 DIAGNOSIS — I10 Essential (primary) hypertension: Secondary | ICD-10-CM

## 2013-02-20 DIAGNOSIS — J019 Acute sinusitis, unspecified: Secondary | ICD-10-CM
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
## 2013-02-20 NOTE — Patient Instructions (Addendum)
[email or web address]  A contour pillow

## 2013-02-22 NOTE — Assessment & Plan Note (Signed)
Continue with current prescription therapy as reflected on the Med list.  

## 2013-03-21 NOTE — Assessment & Plan Note (Signed)
Removed

## 2013-03-21 NOTE — Assessment & Plan Note (Signed)
Continue with current prescription therapy as reflected on the Med list.  

## 2013-03-21 NOTE — Assessment & Plan Note (Signed)
See meds 

## 2013-03-21 NOTE — Assessment & Plan Note (Signed)
Continue with current prn prescription therapy as reflected on the Med list.  

## 2013-04-24 ENCOUNTER — Other Ambulatory Visit: Payer: Self-pay | Admitting: Internal Medicine

## 2013-04-24 ENCOUNTER — Telehealth: Payer: Self-pay

## 2013-04-24 MED ORDER — TRAMADOL HCL 50 MG PO TABS: 50.0000 mg | ORAL_TABLET | Freq: Two times a day (BID) | ORAL | Status: DC | PRN

## 2013-04-24 NOTE — Telephone Encounter (Signed)
Phone call from patient stating [facility] pharmacy has been trying to get in touch with our office regarding her Tramadol being refilled. Not sure what you know regarding this.

## 2013-04-24 NOTE — Telephone Encounter (Signed)
Done

## 2013-04-24 NOTE — Telephone Encounter (Signed)
Ok to refill same sig and [address]

## 2013-04-24 NOTE — Telephone Encounter (Signed)
Please advise ok to Rf Tramadol in PCP absence?Thanks!

## 2013-04-24 NOTE — Telephone Encounter (Signed)
Faxed hardcopy to [facility] [place] 

## 2013-04-24 NOTE — Telephone Encounter (Signed)
Ok to Rf in PCP absence? 

## 2013-04-24 NOTE — Telephone Encounter (Signed)
Done hardcopy to [facility]  

## 2013-05-20 ENCOUNTER — Other Ambulatory Visit: Payer: Self-pay

## 2013-05-20 MED ORDER — LOSARTAN POTASSIUM 100 MG PO TABS: 100.0000 mg | ORAL_TABLET | Freq: Every day | ORAL | Status: DC

## 2013-05-20 NOTE — Telephone Encounter (Signed)
Losartan refilled /met

## 2013-05-25 ENCOUNTER — Ambulatory Visit: Payer: BC Managed Care – PPO | Admitting: Internal Medicine

## 2013-05-26 ENCOUNTER — Other Ambulatory Visit: Payer: Self-pay | Admitting: *Deleted

## 2013-05-26 MED ORDER — LOSARTAN POTASSIUM 100 MG PO TABS: 100.0000 mg | ORAL_TABLET | Freq: Every day | ORAL | Status: DC

## 2013-06-04 ENCOUNTER — Encounter: Payer: Self-pay | Admitting: Internal Medicine

## 2013-06-04 ENCOUNTER — Ambulatory Visit (INDEPENDENT_AMBULATORY_CARE_PROVIDER_SITE_OTHER): Payer: BC Managed Care – PPO | Admitting: Internal Medicine

## 2013-06-04 ENCOUNTER — Other Ambulatory Visit (INDEPENDENT_AMBULATORY_CARE_PROVIDER_SITE_OTHER): Payer: BC Managed Care – PPO

## 2013-06-04 VITALS — BP 128/90 | HR 80 | Temp 97.5°F | Resp 16 | Wt 235.0 lb

## 2013-06-04 DIAGNOSIS — H6120 Impacted cerumen, unspecified ear: Secondary | ICD-10-CM

## 2013-06-04 DIAGNOSIS — E538 Deficiency of other specified B group vitamins: Secondary | ICD-10-CM

## 2013-06-04 DIAGNOSIS — M109 Gout, unspecified: Secondary | ICD-10-CM

## 2013-06-04 DIAGNOSIS — M545 Low back pain, unspecified: Secondary | ICD-10-CM

## 2013-06-04 DIAGNOSIS — M531 Cervicobrachial syndrome: Secondary | ICD-10-CM

## 2013-06-04 DIAGNOSIS — M5481 Occipital neuralgia: Secondary | ICD-10-CM | POA: Insufficient documentation

## 2013-06-04 DIAGNOSIS — J019 Acute sinusitis, unspecified: Secondary | ICD-10-CM

## 2013-06-04 DIAGNOSIS — R600 Localized edema: Secondary | ICD-10-CM

## 2013-06-04 DIAGNOSIS — R609 Edema, unspecified: Secondary | ICD-10-CM

## 2013-06-04 DIAGNOSIS — I10 Essential (primary) hypertension: Secondary | ICD-10-CM

## 2013-06-04 LAB — CBC WITH DIFFERENTIAL/PLATELET
Basophils Absolute: 0.1 10*3/uL (ref 0.0–0.1)
Basophils Relative: 0.8 % (ref 0.0–3.0)
Eosinophils Absolute: 0.5 10*3/uL (ref 0.0–0.7)
Lymphocytes Relative: 41.6 % (ref 12.0–46.0)
MCHC: 33.5 g/dL (ref 30.0–36.0)
MCV: 92 fl (ref 78.0–100.0)
Monocytes Absolute: 0.6 10*3/uL (ref 0.1–1.0)
Neutro Abs: 3.3 10*3/uL (ref 1.4–7.7)
Neutrophils Relative %: 43.4 % (ref 43.0–77.0)
RBC: 4.45 Mil/uL (ref 3.87–5.11)
RDW: 13.2 % (ref 11.5–14.6)

## 2013-06-04 LAB — VITAMIN B12: Vitamin B-12: 1156 pg/mL — ABNORMAL HIGH (ref 211–911)

## 2013-06-04 LAB — BASIC METABOLIC PANEL
CO2: 27 mEq/L (ref 19–32)
Calcium: 8.9 mg/dL (ref 8.4–10.5)
Chloride: 107 mEq/L (ref 96–112)
Creatinine, Ser: 1 mg/dL (ref 0.4–1.2)
Glucose, Bld: 89 mg/dL (ref 70–99)

## 2013-06-04 LAB — URINALYSIS
Ketones, ur: NEGATIVE
Specific Gravity, Urine: 1.015 (ref 1.000–1.030)
Total Protein, Urine: NEGATIVE
Urine Glucose: NEGATIVE

## 2013-06-04 LAB — LIPID PANEL
HDL: 39.8 mg/dL (ref >39.00)
Triglycerides: 103 mg/dL (ref 0.0–149.0)
VLDL: 20.6 mg/dL (ref 0.0–40.0)

## 2013-06-04 LAB — HEPATIC FUNCTION PANEL
Albumin: 3.7 g/dL (ref 3.5–5.2)
Bilirubin, Direct: 0.1 mg/dL (ref 0.0–0.3)
Total Protein: 6.4 g/dL (ref 6.0–8.3)

## 2013-06-04 LAB — HEMOGLOBIN A1C: Hgb A1c MFr Bld: 5.7 % (ref 4.6–6.5)

## 2013-06-04 NOTE — Assessment & Plan Note (Signed)
Continue with current prescription therapy as reflected on the Med list.  

## 2013-06-04 NOTE — Assessment & Plan Note (Signed)
2014 Dr Eduard Clos  B sides  - injections

## 2013-06-04 NOTE — Assessment & Plan Note (Signed)
No relapse 

## 2013-06-04 NOTE — Progress Notes (Signed)
Pre visit review using our clinic review tool, if applicable. No additional management support is needed unless otherwise documented below in the visit note. 
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
## 2013-06-04 NOTE — Assessment & Plan Note (Signed)
No relapse off verapamil

## 2013-07-22 ENCOUNTER — Encounter: Payer: Self-pay | Admitting: Internal Medicine

## 2013-07-22 ENCOUNTER — Ambulatory Visit (INDEPENDENT_AMBULATORY_CARE_PROVIDER_SITE_OTHER)
Admission: RE | Admit: 2013-07-22 | Discharge: 2013-07-22 | Disposition: A | Discharge status: Home / Self Care | Payer: BC Managed Care – PPO | Source: Ambulatory Visit | Attending: Internal Medicine | Admitting: Internal Medicine

## 2013-07-22 ENCOUNTER — Ambulatory Visit (INDEPENDENT_AMBULATORY_CARE_PROVIDER_SITE_OTHER): Payer: BC Managed Care – PPO | Admitting: Internal Medicine

## 2013-07-22 VITALS — BP 150/100 | HR 80 | Temp 97.1°F | Resp 16 | Wt 229.0 lb

## 2013-07-22 DIAGNOSIS — M25552 Pain in left hip: Secondary | ICD-10-CM

## 2013-07-22 DIAGNOSIS — M25559 Pain in unspecified hip: Secondary | ICD-10-CM

## 2013-07-22 DIAGNOSIS — I10 Essential (primary) hypertension: Secondary | ICD-10-CM

## 2013-07-22 DIAGNOSIS — M79609 Pain in unspecified limb: Secondary | ICD-10-CM

## 2013-07-22 DIAGNOSIS — M79606 Pain in leg, unspecified: Secondary | ICD-10-CM

## 2013-07-22 NOTE — Assessment & Plan Note (Addendum)
1/15 x months - started after she turned Ileopsoas and troch major are tender  See Procedure Stretches

## 2013-07-22 NOTE — Progress Notes (Signed)
Pre visit review using our clinic review tool, if applicable. No additional management support is needed unless otherwise documented below in the visit note. 

## 2013-07-23 ENCOUNTER — Telehealth: Payer: Self-pay | Admitting: *Deleted

## 2013-07-23 ENCOUNTER — Encounter: Payer: Self-pay | Admitting: Internal Medicine

## 2013-07-23 MED ORDER — METHYLPREDNISOLONE ACETATE 80 MG/ML IJ SUSP: 80.0000 mg | Freq: Once | INTRAMUSCULAR | Status: DC

## 2013-07-23 MED ORDER — OXYCODONE-ACETAMINOPHEN 5-325 MG PO TABS: 1.0000 | ORAL_TABLET | Freq: Three times a day (TID) | ORAL | Status: DC | PRN

## 2013-07-23 NOTE — Telephone Encounter (Signed)
Called pt no answer LMOM md response. Place rx up front for pick-up...Courtney Nielsen

## 2013-07-23 NOTE — Patient Instructions (Signed)
Postprocedure instructions :    A Band-Aid should be left on for 12 hours. Injection therapy is not a cure itself. It is used in conjunction with other modalities. You can use nonsteroidal anti-inflammatories like ibuprofen , hot and cold compresses. Rest is recommended in the next 24 hours. You need to report immediately  if fever, chills or any signs of infection develop. 
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
## 2013-07-23 NOTE — Telephone Encounter (Signed)
Pt called requesting results back from xray. Gave md response concerning her hip. Pt is wanting to know is there anything she can take to help with the pain...Courtney Nielsen

## 2013-07-23 NOTE — Assessment & Plan Note (Signed)
Continue with current prescription therapy as reflected on the Med list.  

## 2013-07-23 NOTE — Assessment & Plan Note (Signed)
Stretch Sports med consult if problems

## 2013-07-23 NOTE — Telephone Encounter (Signed)
See mychart message - mild OA of the hip. Pick up Oxycodone rx Thx

## 2013-11-10 ENCOUNTER — Other Ambulatory Visit: Payer: Self-pay | Admitting: Internal Medicine

## 2013-11-16 ENCOUNTER — Other Ambulatory Visit: Payer: Self-pay | Admitting: *Deleted

## 2013-11-16 NOTE — Telephone Encounter (Signed)
Pt called to check on this med request. Pt is out of this med. Please advise.

## 2013-11-17 MED ORDER — ALPRAZOLAM 0.5 MG PO TABS: ORAL_TABLET | ORAL | Status: DC

## 2013-11-17 NOTE — Telephone Encounter (Signed)
Received another request for pt. pls advise on refill...Courtney Nielsen

## 2013-11-18 NOTE — Telephone Encounter (Signed)
Called [facility] had to leave authorization on pharmacy vm left md approval.../lmb

## 2013-12-02 ENCOUNTER — Other Ambulatory Visit: Payer: Self-pay | Admitting: Obstetrics and Gynecology

## 2013-12-03 ENCOUNTER — Ambulatory Visit: Payer: BC Managed Care – PPO | Admitting: Internal Medicine

## 2013-12-03 LAB — CYTOLOGY - PAP

## 2013-12-14 LAB — HM MAMMOGRAPHY

## 2013-12-21 ENCOUNTER — Other Ambulatory Visit: Payer: Self-pay | Admitting: Internal Medicine

## 2014-02-23 ENCOUNTER — Emergency Department (HOSPITAL_COMMUNITY)
Admission: EM | Admit: 2014-02-23 | Discharge: 2014-02-23 | Disposition: A | Discharge status: Home / Self Care | Payer: BC Managed Care – PPO | Attending: Emergency Medicine | Admitting: Emergency Medicine

## 2014-02-23 ENCOUNTER — Encounter (HOSPITAL_COMMUNITY): Payer: Self-pay | Admitting: Emergency Medicine

## 2014-02-23 ENCOUNTER — Emergency Department (HOSPITAL_COMMUNITY): Payer: BC Managed Care – PPO

## 2014-02-23 ENCOUNTER — Telehealth: Payer: Self-pay

## 2014-02-23 DIAGNOSIS — Z8719 Personal history of other diseases of the digestive system: Secondary | ICD-10-CM | POA: Diagnosis not present

## 2014-02-23 DIAGNOSIS — S139XXA Sprain of joints and ligaments of unspecified parts of neck, initial encounter: Secondary | ICD-10-CM | POA: Insufficient documentation

## 2014-02-23 DIAGNOSIS — S99919A Unspecified injury of unspecified ankle, initial encounter: Secondary | ICD-10-CM | POA: Diagnosis not present

## 2014-02-23 DIAGNOSIS — S8990XA Unspecified injury of unspecified lower leg, initial encounter: Secondary | ICD-10-CM | POA: Insufficient documentation

## 2014-02-23 DIAGNOSIS — Z87891 Personal history of nicotine dependence: Secondary | ICD-10-CM | POA: Insufficient documentation

## 2014-02-23 DIAGNOSIS — Y9389 Activity, other specified: Secondary | ICD-10-CM | POA: Diagnosis not present

## 2014-02-23 DIAGNOSIS — Z9889 Other specified postprocedural states: Secondary | ICD-10-CM | POA: Insufficient documentation

## 2014-02-23 DIAGNOSIS — I10 Essential (primary) hypertension: Secondary | ICD-10-CM | POA: Diagnosis not present

## 2014-02-23 DIAGNOSIS — Z0000 Encounter for general adult medical examination without abnormal findings: Secondary | ICD-10-CM

## 2014-02-23 DIAGNOSIS — Z860100 Personal history of colon polyps, unspecified: Secondary | ICD-10-CM | POA: Insufficient documentation

## 2014-02-23 DIAGNOSIS — Z7982 Long term (current) use of aspirin: Secondary | ICD-10-CM | POA: Insufficient documentation

## 2014-02-23 DIAGNOSIS — M109 Gout, unspecified: Secondary | ICD-10-CM

## 2014-02-23 DIAGNOSIS — S199XXA Unspecified injury of neck, initial encounter: Secondary | ICD-10-CM

## 2014-02-23 DIAGNOSIS — J449 Chronic obstructive pulmonary disease, unspecified: Secondary | ICD-10-CM | POA: Insufficient documentation

## 2014-02-23 DIAGNOSIS — J4489 Other specified chronic obstructive pulmonary disease: Secondary | ICD-10-CM | POA: Insufficient documentation

## 2014-02-23 DIAGNOSIS — Y92410 Unspecified street and highway as the place of occurrence of the external cause: Secondary | ICD-10-CM | POA: Insufficient documentation

## 2014-02-23 DIAGNOSIS — Z79899 Other long term (current) drug therapy: Secondary | ICD-10-CM | POA: Insufficient documentation

## 2014-02-23 DIAGNOSIS — S0993XA Unspecified injury of face, initial encounter: Secondary | ICD-10-CM | POA: Diagnosis present

## 2014-02-23 DIAGNOSIS — Z8639 Personal history of other endocrine, nutritional and metabolic disease: Secondary | ICD-10-CM | POA: Insufficient documentation

## 2014-02-23 DIAGNOSIS — Z862 Personal history of diseases of the blood and blood-forming organs and certain disorders involving the immune mechanism: Secondary | ICD-10-CM | POA: Diagnosis not present

## 2014-02-23 DIAGNOSIS — E538 Deficiency of other specified B group vitamins: Secondary | ICD-10-CM

## 2014-02-23 DIAGNOSIS — S99929A Unspecified injury of unspecified foot, initial encounter: Secondary | ICD-10-CM

## 2014-02-23 DIAGNOSIS — S161XXA Strain of muscle, fascia and tendon at neck level, initial encounter: Secondary | ICD-10-CM

## 2014-02-23 MED ORDER — METHOCARBAMOL 500 MG PO TABS: 500.0000 mg | ORAL_TABLET | Freq: Three times a day (TID) | ORAL | Status: DC
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
## 2014-02-23 NOTE — Telephone Encounter (Signed)
CMET, CBC, UA, Lipids, TSH, uric acid, A1c Thx
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
## 2014-02-23 NOTE — Telephone Encounter (Signed)
Pt has an appt scheduled on 03/10/14 for a CPE, pt wanted to know if she could get her labs done before her appt.  Please advise
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
## 2014-02-26 NOTE — ED Provider Notes (Signed)
Medical screening examination/treatment/procedure(s) were performed by non-physician practitioner and as supervising physician I was immediately available for consultation/collaboration.   EKG Interpretation None        Maudry Diego, MD 02/26/14 [phone]

## 2014-03-02 ENCOUNTER — Other Ambulatory Visit (INDEPENDENT_AMBULATORY_CARE_PROVIDER_SITE_OTHER): Payer: BC Managed Care – PPO

## 2014-03-02 DIAGNOSIS — Z0000 Encounter for general adult medical examination without abnormal findings: Secondary | ICD-10-CM

## 2014-03-02 DIAGNOSIS — E538 Deficiency of other specified B group vitamins: Secondary | ICD-10-CM

## 2014-03-02 DIAGNOSIS — M109 Gout, unspecified: Secondary | ICD-10-CM

## 2014-03-02 DIAGNOSIS — I10 Essential (primary) hypertension: Secondary | ICD-10-CM

## 2014-03-02 LAB — URINALYSIS, ROUTINE W REFLEX MICROSCOPIC
BILIRUBIN URINE: NEGATIVE
KETONES UR: NEGATIVE
Leukocytes, UA: NEGATIVE
Nitrite: NEGATIVE
Specific Gravity, Urine: 1.02 (ref 1.000–1.030)
TOTAL PROTEIN, URINE-UPE24: NEGATIVE
URINE GLUCOSE: NEGATIVE
UROBILINOGEN UA: 0.2 (ref 0.0–1.0)
pH: 6 (ref 5.0–8.0)

## 2014-03-02 LAB — COMPREHENSIVE METABOLIC PANEL
ALT: 34 U/L (ref 0–35)
AST: 22 U/L (ref 0–37)
Albumin: 3.6 g/dL (ref 3.5–5.2)
Alkaline Phosphatase: 74 U/L (ref 39–117)
BUN: 13 mg/dL (ref 6–23)
CALCIUM: 8.9 mg/dL (ref 8.4–10.5)
CO2: 28 mEq/L (ref 19–32)
CREATININE: 1 mg/dL (ref 0.4–1.2)
Chloride: 108 mEq/L (ref 96–112)
GFR: 59.17 mL/min — ABNORMAL LOW (ref >60.00)
Glucose, Bld: 98 mg/dL (ref 70–99)
POTASSIUM: 3.9 meq/L (ref 3.5–5.1)
Sodium: 141 mEq/L (ref 135–145)
Total Bilirubin: 1.1 mg/dL (ref 0.2–1.2)
Total Protein: 6.5 g/dL (ref 6.0–8.3)

## 2014-03-02 LAB — LIPID PANEL
CHOL/HDL RATIO: 5
CHOLESTEROL: 195 mg/dL (ref 0–200)
HDL: 38.7 mg/dL — AB (ref >39.00)
LDL CALC: 136 mg/dL — AB (ref 0–99)
NonHDL: 156.3
TRIGLYCERIDES: 102 mg/dL (ref 0.0–149.0)
VLDL: 20.4 mg/dL (ref 0.0–40.0)

## 2014-03-02 LAB — CBC
HEMATOCRIT: 40.8 % (ref 36.0–46.0)
Hemoglobin: 13.7 g/dL (ref 12.0–15.0)
MCHC: 33.5 g/dL (ref 30.0–36.0)
MCV: 93.9 fl (ref 78.0–100.0)
PLATELETS: 221 10*3/uL (ref 150.0–400.0)
RBC: 4.35 Mil/uL (ref 3.87–5.11)
RDW: 13.4 % (ref 11.5–15.5)
WBC: 7.7 10*3/uL (ref 4.0–10.5)

## 2014-03-02 LAB — HEMOGLOBIN A1C: Hgb A1c MFr Bld: 5.6 % (ref 4.6–6.5)

## 2014-03-02 LAB — URIC ACID: Uric Acid, Serum: 7 mg/dL (ref 2.4–7.0)

## 2014-03-02 LAB — TSH: TSH: 0.69 u[IU]/mL (ref 0.35–4.50)

## 2014-03-10 ENCOUNTER — Encounter: Payer: Self-pay | Admitting: Internal Medicine

## 2014-03-10 ENCOUNTER — Ambulatory Visit (INDEPENDENT_AMBULATORY_CARE_PROVIDER_SITE_OTHER): Payer: BC Managed Care – PPO | Admitting: Internal Medicine

## 2014-03-10 VITALS — BP 150/92 | HR 88 | Temp 98.2°F | Resp 16 | Ht 70.0 in | Wt 235.0 lb

## 2014-03-10 DIAGNOSIS — M5481 Occipital neuralgia: Secondary | ICD-10-CM

## 2014-03-10 DIAGNOSIS — M545 Low back pain, unspecified: Secondary | ICD-10-CM

## 2014-03-10 DIAGNOSIS — M79609 Pain in unspecified limb: Secondary | ICD-10-CM

## 2014-03-10 DIAGNOSIS — M542 Cervicalgia: Secondary | ICD-10-CM

## 2014-03-10 DIAGNOSIS — M546 Pain in thoracic spine: Secondary | ICD-10-CM

## 2014-03-10 DIAGNOSIS — M79669 Pain in unspecified lower leg: Secondary | ICD-10-CM

## 2014-03-10 DIAGNOSIS — M531 Cervicobrachial syndrome: Secondary | ICD-10-CM

## 2014-03-10 MED ORDER — LOSARTAN POTASSIUM 100 MG PO TABS: 100.0000 mg | ORAL_TABLET | Freq: Every evening | ORAL | Status: DC

## 2014-03-10 MED ORDER — ALPRAZOLAM 0.5 MG PO TABS: 0.5000 mg | ORAL_TABLET | Freq: Every day | ORAL | Status: DC
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
## 2014-03-10 NOTE — Progress Notes (Signed)
Pre visit review using our clinic review tool, if applicable. No additional management support is needed unless otherwise documented below in the visit note. 
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
## 2014-03-18 ENCOUNTER — Telehealth: Payer: Self-pay | Admitting: Internal Medicine
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
## 2014-03-18 NOTE — Telephone Encounter (Signed)
Please schedule OV with any MD.

## 2014-04-10 ENCOUNTER — Other Ambulatory Visit: Payer: Self-pay | Admitting: Internal Medicine

## 2014-04-16 ENCOUNTER — Other Ambulatory Visit: Payer: Self-pay

## 2014-04-16 DIAGNOSIS — M47812 Spondylosis without myelopathy or radiculopathy, cervical region: Secondary | ICD-10-CM | POA: Insufficient documentation

## 2014-04-26 ENCOUNTER — Ambulatory Visit: Payer: BC Managed Care – PPO | Admitting: Internal Medicine

## 2014-06-14 ENCOUNTER — Ambulatory Visit (INDEPENDENT_AMBULATORY_CARE_PROVIDER_SITE_OTHER): Payer: BC Managed Care – PPO | Admitting: Internal Medicine

## 2014-06-14 ENCOUNTER — Encounter: Payer: Self-pay | Admitting: Internal Medicine

## 2014-06-14 VITALS — BP 150/80 | HR 90 | Temp 97.7°F | Wt 231.0 lb

## 2014-06-14 DIAGNOSIS — E538 Deficiency of other specified B group vitamins: Secondary | ICD-10-CM

## 2014-06-14 DIAGNOSIS — M542 Cervicalgia: Secondary | ICD-10-CM

## 2014-06-14 DIAGNOSIS — R600 Localized edema: Secondary | ICD-10-CM

## 2014-06-14 DIAGNOSIS — I10 Essential (primary) hypertension: Secondary | ICD-10-CM

## 2014-06-14 DIAGNOSIS — M1000 Idiopathic gout, unspecified site: Secondary | ICD-10-CM

## 2014-06-14 MED ORDER — CELECOXIB 200 MG PO CAPS: 200.0000 mg | ORAL_CAPSULE | Freq: Two times a day (BID) | ORAL | Status: DC

## 2014-06-14 NOTE — Assessment & Plan Note (Signed)
Chronic BP is good at home

## 2014-06-14 NOTE — Patient Instructions (Signed)
A contour pillow   

## 2014-06-14 NOTE — Assessment & Plan Note (Signed)
Chronic  Cont B12

## 2014-06-14 NOTE — Progress Notes (Signed)
Pre visit review using our clinic review tool, if applicable. No additional management support is needed unless otherwise documented below in the visit note. 

## 2014-06-14 NOTE — Assessment & Plan Note (Signed)
No relapse 

## 2014-06-14 NOTE — Assessment & Plan Note (Signed)
L sided - OA (Dr Vertell Limber and Dr Maryjean Ka 2015) On [place]
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
## 2014-06-14 NOTE — Assessment & Plan Note (Signed)
Better  

## 2014-06-15 ENCOUNTER — Telehealth: Payer: Self-pay | Admitting: Internal Medicine

## 2014-06-15 NOTE — Telephone Encounter (Signed)
emmi emailed °

## 2014-10-13 ENCOUNTER — Telehealth: Payer: Self-pay | Admitting: Internal Medicine

## 2014-10-13 MED ORDER — LOSARTAN POTASSIUM 100 MG PO TABS: 100.0000 mg | ORAL_TABLET | Freq: Every day | ORAL | Status: DC

## 2014-10-13 NOTE — Telephone Encounter (Signed)
She needs a refill for losartan (COZAAR) 100 MG tablet [[id number] and [facility] has requested the refill but we have denied.

## 2014-10-13 NOTE — Telephone Encounter (Signed)
I wasn't aware she needed a refill. Refill has been sent. Pt informed

## 2014-10-15 ENCOUNTER — Ambulatory Visit: Payer: BC Managed Care – PPO | Admitting: Internal Medicine

## 2014-12-17 ENCOUNTER — Other Ambulatory Visit: Payer: Self-pay | Admitting: Internal Medicine

## 2014-12-17 NOTE — Telephone Encounter (Signed)
Patients last office visit was dec 2015---please advise, thanks

## 2014-12-29 ENCOUNTER — Encounter: Payer: Self-pay | Admitting: Gastroenterology

## 2014-12-29 ENCOUNTER — Other Ambulatory Visit: Payer: Self-pay | Admitting: Internal Medicine

## 2015-01-04 ENCOUNTER — Other Ambulatory Visit: Payer: Self-pay | Admitting: Internal Medicine

## 2015-01-04 DIAGNOSIS — Z0000 Encounter for general adult medical examination without abnormal findings: Secondary | ICD-10-CM

## 2015-01-04 DIAGNOSIS — R5383 Other fatigue: Secondary | ICD-10-CM

## 2015-03-09 ENCOUNTER — Other Ambulatory Visit (INDEPENDENT_AMBULATORY_CARE_PROVIDER_SITE_OTHER): Payer: BLUE CROSS/BLUE SHIELD

## 2015-03-09 DIAGNOSIS — R5383 Other fatigue: Secondary | ICD-10-CM

## 2015-03-09 DIAGNOSIS — Z0000 Encounter for general adult medical examination without abnormal findings: Secondary | ICD-10-CM | POA: Diagnosis not present

## 2015-03-09 LAB — URINALYSIS
Bilirubin Urine: NEGATIVE
Ketones, ur: NEGATIVE
Leukocytes, UA: NEGATIVE
Nitrite: NEGATIVE
Specific Gravity, Urine: >=1.03 — AB (ref 1.000–1.030)
URINE GLUCOSE: NEGATIVE
Urobilinogen, UA: 0.2 (ref 0.0–1.0)
pH: 6 (ref 5.0–8.0)

## 2015-03-09 LAB — CBC WITH DIFFERENTIAL/PLATELET
BASOS ABS: 0.1 10*3/uL (ref 0.0–0.1)
Basophils Relative: 0.6 % (ref 0.0–3.0)
EOS ABS: 0.3 10*3/uL (ref 0.0–0.7)
Eosinophils Relative: 3.4 % (ref 0.0–5.0)
HCT: 45.2 % (ref 36.0–46.0)
HEMOGLOBIN: 15 g/dL (ref 12.0–15.0)
Lymphocytes Relative: 40.6 % (ref 12.0–46.0)
Lymphs Abs: 3.6 10*3/uL (ref 0.7–4.0)
MCHC: 33.1 g/dL (ref 30.0–36.0)
MCV: 93.9 fl (ref 78.0–100.0)
Monocytes Absolute: 0.6 10*3/uL (ref 0.1–1.0)
Monocytes Relative: 6.9 % (ref 3.0–12.0)
NEUTROS PCT: 48.5 % (ref 43.0–77.0)
Neutro Abs: 4.3 10*3/uL (ref 1.4–7.7)
Platelets: 255 10*3/uL (ref 150.0–400.0)
RBC: 4.81 Mil/uL (ref 3.87–5.11)
RDW: 13.1 % (ref 11.5–15.5)
WBC: 8.8 10*3/uL (ref 4.0–10.5)

## 2015-03-09 LAB — HEPATIC FUNCTION PANEL
ALT: 19 U/L (ref 0–35)
AST: 14 U/L (ref 0–37)
Albumin: 4.1 g/dL (ref 3.5–5.2)
Alkaline Phosphatase: 81 U/L (ref 39–117)
Bilirubin, Direct: 0.2 mg/dL (ref 0.0–0.3)
Total Bilirubin: 1 mg/dL (ref 0.2–1.2)
Total Protein: 6.9 g/dL (ref 6.0–8.3)

## 2015-03-09 LAB — LIPID PANEL
Cholesterol: 218 mg/dL — ABNORMAL HIGH (ref 0–200)
HDL: 43.4 mg/dL (ref >39.00)
LDL Cholesterol: 152 mg/dL — ABNORMAL HIGH (ref 0–99)
NonHDL: 174.66
Total CHOL/HDL Ratio: 5
Triglycerides: 111 mg/dL (ref 0.0–149.0)
VLDL: 22.2 mg/dL (ref 0.0–40.0)

## 2015-03-09 LAB — BASIC METABOLIC PANEL
BUN: 14 mg/dL (ref 6–23)
CO2: 28 mEq/L (ref 19–32)
Calcium: 9.4 mg/dL (ref 8.4–10.5)
Chloride: 105 mEq/L (ref 96–112)
Creatinine, Ser: 1.11 mg/dL (ref 0.40–1.20)
GFR: 53.48 mL/min — ABNORMAL LOW (ref >60.00)
Glucose, Bld: 90 mg/dL (ref 70–99)
Potassium: 3.7 mEq/L (ref 3.5–5.1)
Sodium: 142 mEq/L (ref 135–145)

## 2015-03-09 LAB — TSH: TSH: 1.01 u[IU]/mL (ref 0.35–4.50)

## 2015-03-10 LAB — B. BURGDORFI ANTIBODIES: B burgdorferi Ab IgG+IgM: 0.31 {ISR}

## 2015-03-15 ENCOUNTER — Encounter: Payer: Self-pay | Admitting: Internal Medicine

## 2015-03-15 ENCOUNTER — Ambulatory Visit (INDEPENDENT_AMBULATORY_CARE_PROVIDER_SITE_OTHER): Payer: BLUE CROSS/BLUE SHIELD | Admitting: Internal Medicine

## 2015-03-15 VITALS — BP 135/80 | HR 72 | Wt 236.0 lb

## 2015-03-15 DIAGNOSIS — Z0000 Encounter for general adult medical examination without abnormal findings: Secondary | ICD-10-CM | POA: Diagnosis not present

## 2015-03-15 DIAGNOSIS — K219 Gastro-esophageal reflux disease without esophagitis: Secondary | ICD-10-CM

## 2015-03-15 DIAGNOSIS — M5481 Occipital neuralgia: Secondary | ICD-10-CM

## 2015-03-15 DIAGNOSIS — R635 Abnormal weight gain: Secondary | ICD-10-CM | POA: Diagnosis not present

## 2015-03-15 DIAGNOSIS — M542 Cervicalgia: Secondary | ICD-10-CM

## 2015-03-15 DIAGNOSIS — E538 Deficiency of other specified B group vitamins: Secondary | ICD-10-CM | POA: Diagnosis not present

## 2015-03-15 DIAGNOSIS — Z23 Encounter for immunization: Secondary | ICD-10-CM | POA: Diagnosis not present

## 2015-03-15 DIAGNOSIS — I10 Essential (primary) hypertension: Secondary | ICD-10-CM

## 2015-03-15 NOTE — Assessment & Plan Note (Signed)
Off Rx 
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
## 2015-03-15 NOTE — Assessment & Plan Note (Signed)
We discussed age appropriate health related issues, including available/recomended screening tests and vaccinations. We discussed a need for adhering to healthy diet and exercise. Labs/EKG were reviewed/ordered. All questions were answered. GYN q 12 mo Colon up to date Flu shot at work

## 2015-03-15 NOTE — Assessment & Plan Note (Signed)
Not smoking.  

## 2015-03-15 NOTE — Assessment & Plan Note (Signed)
On Nucyntha and Celebrex

## 2015-03-15 NOTE — Assessment & Plan Note (Signed)
Losartan 

## 2015-03-15 NOTE — Assessment & Plan Note (Signed)
On Vit B12 

## 2015-03-15 NOTE — Assessment & Plan Note (Signed)
Better  

## 2015-03-15 NOTE — Progress Notes (Signed)
Pre visit review using our clinic review tool, if applicable. No additional management support is needed unless otherwise documented below in the visit note. 

## 2015-04-19 ENCOUNTER — Other Ambulatory Visit: Payer: Self-pay | Admitting: Internal Medicine

## 2015-05-16 ENCOUNTER — Telehealth: Payer: Self-pay | Admitting: Internal Medicine

## 2015-05-16 DIAGNOSIS — M25562 Pain in left knee: Principal | ICD-10-CM

## 2015-05-16 DIAGNOSIS — M25561 Pain in right knee: Secondary | ICD-10-CM

## 2015-05-16 NOTE — Telephone Encounter (Signed)
Pt called in and wanted know if Dr Camila Li could put a referral for [facility] ortho so she should try and get in faster. She said that that she is in pain and can not get in with them soon.  She wanted to know if Dr Camila Li could do anything?

## 2015-05-16 NOTE — Telephone Encounter (Signed)
Called pt back she states she is having pain in both of her legs. She stated she talk with you abt sxs at last visit and you mention it could be restless leg syndrome, but it doesn't feel like RLS pain starts at the joint where torso is, and at night when she lay down she is having (L) knee pain...Johny Chess

## 2015-05-16 NOTE — Telephone Encounter (Signed)
where is the pain?back? Thx

## 2015-05-17 NOTE — Telephone Encounter (Signed)
Notified pt with md response.../lmb 

## 2015-05-17 NOTE — Telephone Encounter (Signed)
Ok Thx 

## 2015-08-02 ENCOUNTER — Other Ambulatory Visit: Payer: Self-pay | Admitting: Internal Medicine

## 2015-09-12 ENCOUNTER — Ambulatory Visit (INDEPENDENT_AMBULATORY_CARE_PROVIDER_SITE_OTHER): Payer: BLUE CROSS/BLUE SHIELD | Admitting: Internal Medicine

## 2015-09-12 ENCOUNTER — Encounter: Payer: Self-pay | Admitting: Internal Medicine

## 2015-09-12 VITALS — BP 132/90 | HR 96 | Wt 232.0 lb

## 2015-09-12 DIAGNOSIS — E538 Deficiency of other specified B group vitamins: Secondary | ICD-10-CM

## 2015-09-12 DIAGNOSIS — M79604 Pain in right leg: Secondary | ICD-10-CM

## 2015-09-12 DIAGNOSIS — K219 Gastro-esophageal reflux disease without esophagitis: Secondary | ICD-10-CM

## 2015-09-12 DIAGNOSIS — R55 Syncope and collapse: Secondary | ICD-10-CM

## 2015-09-12 MED ORDER — ALPRAZOLAM 0.5 MG PO TABS: 0.5000 mg | ORAL_TABLET | Freq: Every evening | ORAL | Status: DC | PRN

## 2015-09-12 NOTE — Assessment & Plan Note (Signed)
Off inhalers 
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
## 2015-09-12 NOTE — Assessment & Plan Note (Signed)
No relapse 

## 2015-09-12 NOTE — Assessment & Plan Note (Signed)
R knee pain - will have an arthroscopic surgery soon.

## 2015-09-12 NOTE — Progress Notes (Signed)
Pre visit review using our clinic review tool, if applicable. No additional management support is needed unless otherwise documented below in the visit note. 

## 2015-09-12 NOTE — Assessment & Plan Note (Signed)
On B12 

## 2015-09-12 NOTE — Assessment & Plan Note (Signed)
Off Rx 

## 2015-10-09 ENCOUNTER — Ambulatory Visit: Payer: Self-pay | Admitting: Orthopedic Surgery

## 2015-10-09 NOTE — Progress Notes (Signed)
Preoperative surgical orders have been place into the Epic hospital system for [facility] on 10/09/2015, 7:35 PM  by Mickel Crow for surgery on 10/19/15.  Preop Knee Scope orders including IV Tylenol and IV Decadron as long as there are no contraindications to the above medications. Arlee Muslim, PA-C

## 2015-10-11 NOTE — Patient Instructions (Signed)
Courtney Nielsen  10/11/2015   Your procedure is scheduled on: 10/19/2015    Report to [facility] Main  Entrance take [place]  elevators to 3rd floor to  [place] at   [place] AM.  Call this number if you have problems the morning of surgery [phone]   Remember: ONLY 1 PERSON MAY GO WITH YOU TO SHORT STAY TO GET  READY MORNING OF YOUR SURGERY.  Do not eat food or drink liquids :After Midnight.     Take these medicines the morning of surgery with A SIP OF WATER: none                                 You may not have any metal on your body including hair pins and              piercings  Do not wear jewelry, make-up, lotions, powders or perfumes, deodorant             Do not wear nail polish.  Do not shave  48 hours prior to surgery.             Do not bring valuables to the hospital. [place].  Contacts, dentures or bridgework may not be worn into surgery.  .     Patients discharged the day of surgery will not be allowed to drive home.  Name and phone number of your driver:  Special Instructions: coughing and deep breathing exercises, leg exercises               Please read over the following fact sheets you were given: _____________________________________________________________________             [facility] - Preparing for Surgery Before surgery, you can play an important role.  Because skin is not sterile, your skin needs to be as free of germs as possible.  You can reduce the number of germs on your skin by washing with CHG (chlorahexidine gluconate) soap before surgery.  CHG is an antiseptic cleaner which kills germs and bonds with the skin to continue killing germs even after washing. Please DO NOT use if you have an allergy to CHG or antibacterial soaps.  If your skin becomes reddened/irritated stop using the CHG and inform your nurse when you arrive at Short Stay. Do not shave (including  legs and underarms) for at least 48 hours prior to the first CHG shower.  You may shave your face/neck. Please follow these instructions carefully:  1.  Shower with CHG Soap the night before surgery and the  morning of Surgery.  2.  If you choose to wash your hair, wash your hair first as usual with your  normal  shampoo.  3.  After you shampoo, rinse your hair and body thoroughly to remove the  shampoo.                           4.  Use CHG as you would any other liquid soap.  You can apply chg directly  to the skin and wash  Gently with a scrungie or clean washcloth.  5.  Apply the CHG Soap to your body ONLY FROM THE NECK DOWN.   Do not use on face/ open                           Wound or open sores. Avoid contact with eyes, ears mouth and genitals (private parts).                       Wash face,  Genitals (private parts) with your normal soap.             6.  Wash thoroughly, paying special attention to the area where your surgery  will be performed.  7.  Thoroughly rinse your body with warm water from the neck down.  8.  DO NOT shower/wash with your normal soap after using and rinsing off  the CHG Soap.                9.  Pat yourself dry with a clean towel.            10.  Wear clean pajamas.            11.  Place clean sheets on your bed the night of your first shower and do not  sleep with pets. Day of Surgery : Do not apply any lotions/deodorants the morning of surgery.  Please wear clean clothes to the hospital/surgery center.  FAILURE TO FOLLOW THESE INSTRUCTIONS MAY RESULT IN THE CANCELLATION OF YOUR SURGERY PATIENT SIGNATURE_________________________________  NURSE SIGNATURE__________________________________  ________________________________________________________________________   Courtney Nielsen  An incentive spirometer is a tool that can help keep your lungs clear and active. This tool measures how well you are filling your lungs with each breath.  Taking long deep breaths may help reverse or decrease the chance of developing breathing (pulmonary) problems (especially infection) following:  A long period of time when you are unable to move or be active. BEFORE THE PROCEDURE   If the spirometer includes an indicator to show your best effort, your nurse or respiratory therapist will set it to a desired goal.  If possible, sit up straight or lean slightly forward. Try not to slouch.  Hold the incentive spirometer in an upright position. INSTRUCTIONS FOR USE  1. Sit on the edge of your bed if possible, or sit up as far as you can in bed or on a chair. 2. Hold the incentive spirometer in an upright position. 3. Breathe out normally. 4. Place the mouthpiece in your mouth and seal your lips tightly around it. 5. Breathe in slowly and as deeply as possible, raising the piston or the ball toward the top of the column. 6. Hold your breath for 3-5 seconds or for as long as possible. Allow the piston or ball to fall to the bottom of the column. 7. Remove the mouthpiece from your mouth and breathe out normally. 8. Rest for a few seconds and repeat Steps 1 through 7 at least 10 times every 1-2 hours when you are awake. Take your time and take a few normal breaths between deep breaths. 9. The spirometer may include an indicator to show your best effort. Use the indicator as a goal to work toward during each repetition. 10. After each set of 10 deep breaths, practice coughing to be sure your lungs are clear. If you have an incision (the cut made at the time of surgery),  support your incision when coughing by placing a pillow or rolled up towels firmly against it. Once you are able to get out of bed, walk around indoors and cough well. You may stop using the incentive spirometer when instructed by your caregiver.  RISKS AND COMPLICATIONS  Take your time so you do not get dizzy or light-headed.  If you are in pain, you may need to take or ask for pain  medication before doing incentive spirometry. It is harder to take a deep breath if you are having pain. AFTER USE  Rest and breathe slowly and easily.  It can be helpful to keep track of a log of your progress. Your caregiver can provide you with a simple table to help with this. If you are using the spirometer at home, follow these instructions: [place] IF:   You are having difficultly using the spirometer.  You have trouble using the spirometer as often as instructed.  Your pain medication is not giving enough relief while using the spirometer.  You develop fever of 100.5 F (38.1 C) or higher. SEEK IMMEDIATE MEDICAL CARE IF:   You cough up bloody sputum that had not been present before.  You develop fever of 102 F (38.9 C) or greater.  You develop worsening pain at or near the incision site. MAKE SURE YOU:   Understand these instructions.  Will watch your condition.  Will get help right away if you are not doing well or get worse. Document Released: 10/29/2006 Document Revised: 09/10/2011 Document Reviewed: 12/30/2006 [facility] Patient Information 2014 [place], [place].   ________________________________________________________________________

## 2015-10-12 ENCOUNTER — Encounter (HOSPITAL_COMMUNITY)
Admission: RE | Admit: 2015-10-12 | Discharge: 2015-10-12 | Disposition: A | Discharge status: Home / Self Care | Payer: BLUE CROSS/BLUE SHIELD | Source: Ambulatory Visit | Attending: Orthopedic Surgery | Admitting: Orthopedic Surgery

## 2015-10-12 ENCOUNTER — Encounter (HOSPITAL_COMMUNITY): Payer: Self-pay

## 2015-10-12 DIAGNOSIS — Z01818 Encounter for other preprocedural examination: Secondary | ICD-10-CM | POA: Insufficient documentation

## 2015-10-12 HISTORY — DX: Unspecified osteoarthritis, unspecified site: M19.90

## 2015-10-12 HISTORY — DX: Pneumonia, unspecified organism: J18.9

## 2015-10-12 HISTORY — DX: Cardiac arrhythmia, unspecified: I49.9

## 2015-10-12 LAB — CBC
HCT: 41.2 % (ref 36.0–46.0)
HEMOGLOBIN: 13.9 g/dL (ref 12.0–15.0)
MCH: 31.2 pg (ref 26.0–34.0)
MCHC: 33.7 g/dL (ref 30.0–36.0)
MCV: 92.6 fL (ref 78.0–100.0)
PLATELETS: 282 10*3/uL (ref 150–400)
RBC: 4.45 MIL/uL (ref 3.87–5.11)
RDW: 13.1 % (ref 11.5–15.5)
WBC: 9.1 10*3/uL (ref 4.0–10.5)

## 2015-10-12 LAB — BASIC METABOLIC PANEL
ANION GAP: 11 (ref 5–15)
BUN: 15 mg/dL (ref 6–20)
CO2: 23 mmol/L (ref 22–32)
Calcium: 9.1 mg/dL (ref 8.9–10.3)
Chloride: 105 mmol/L (ref 101–111)
Creatinine, Ser: 1.07 mg/dL — ABNORMAL HIGH (ref 0.44–1.00)
GFR calc Af Amer: >60 mL/min (ref >60)
GFR, EST NON AFRICAN AMERICAN: 56 mL/min — AB (ref >60)
GLUCOSE: 97 mg/dL (ref 65–99)
POTASSIUM: 4 mmol/L (ref 3.5–5.1)
Sodium: 139 mmol/L (ref 135–145)

## 2015-10-12 LAB — SURGICAL PCR SCREEN
MRSA, PCR: NEGATIVE
STAPHYLOCOCCUS AUREUS: NEGATIVE

## 2015-10-18 MED ORDER — VANCOMYCIN HCL 10 G IV SOLR
1500.0000 mg | INTRAVENOUS | Status: AC
  Administered 2015-10-19: 1500 mg via INTRAVENOUS
  Filled 2015-10-18: qty 1500

## 2015-10-19 ENCOUNTER — Encounter (HOSPITAL_COMMUNITY): Payer: Self-pay | Admitting: *Deleted

## 2015-10-19 ENCOUNTER — Encounter (HOSPITAL_COMMUNITY)
Admission: RE | Disposition: A | Discharge status: Home / Self Care | Payer: Self-pay | Source: Ambulatory Visit | Attending: Orthopedic Surgery

## 2015-10-19 ENCOUNTER — Ambulatory Visit (HOSPITAL_COMMUNITY): Payer: BLUE CROSS/BLUE SHIELD | Admitting: Certified Registered Nurse Anesthetist

## 2015-10-19 ENCOUNTER — Ambulatory Visit (HOSPITAL_COMMUNITY)
Admission: RE | Admit: 2015-10-19 | Discharge: 2015-10-19 | Disposition: A | Discharge status: Home / Self Care | Payer: BLUE CROSS/BLUE SHIELD | Source: Ambulatory Visit | Attending: Orthopedic Surgery | Admitting: Orthopedic Surgery

## 2015-10-19 DIAGNOSIS — Z7982 Long term (current) use of aspirin: Secondary | ICD-10-CM | POA: Diagnosis not present

## 2015-10-19 DIAGNOSIS — M1990 Unspecified osteoarthritis, unspecified site: Secondary | ICD-10-CM | POA: Diagnosis not present

## 2015-10-19 DIAGNOSIS — X58XXXA Exposure to other specified factors, initial encounter: Secondary | ICD-10-CM | POA: Diagnosis not present

## 2015-10-19 DIAGNOSIS — M23341 Other meniscus derangements, anterior horn of lateral meniscus, right knee: Secondary | ICD-10-CM | POA: Diagnosis not present

## 2015-10-19 DIAGNOSIS — S83281A Other tear of lateral meniscus, current injury, right knee, initial encounter: Secondary | ICD-10-CM | POA: Insufficient documentation

## 2015-10-19 DIAGNOSIS — I10 Essential (primary) hypertension: Secondary | ICD-10-CM | POA: Diagnosis not present

## 2015-10-19 DIAGNOSIS — J449 Chronic obstructive pulmonary disease, unspecified: Secondary | ICD-10-CM | POA: Diagnosis not present

## 2015-10-19 DIAGNOSIS — Z87891 Personal history of nicotine dependence: Secondary | ICD-10-CM | POA: Diagnosis not present

## 2015-10-19 DIAGNOSIS — Z79899 Other long term (current) drug therapy: Secondary | ICD-10-CM | POA: Insufficient documentation

## 2015-10-19 DIAGNOSIS — Z791 Long term (current) use of non-steroidal anti-inflammatories (NSAID): Secondary | ICD-10-CM | POA: Diagnosis not present

## 2015-10-19 DIAGNOSIS — M25561 Pain in right knee: Secondary | ICD-10-CM | POA: Diagnosis present

## 2015-10-19 DIAGNOSIS — M2241 Chondromalacia patellae, right knee: Secondary | ICD-10-CM | POA: Diagnosis not present

## 2015-10-19 DIAGNOSIS — Z79890 Hormone replacement therapy (postmenopausal): Secondary | ICD-10-CM | POA: Insufficient documentation

## 2015-10-19 DIAGNOSIS — S83289A Other tear of lateral meniscus, current injury, unspecified knee, initial encounter: Secondary | ICD-10-CM | POA: Diagnosis present

## 2015-10-19 DIAGNOSIS — M23351 Other meniscus derangements, posterior horn of lateral meniscus, right knee: Secondary | ICD-10-CM | POA: Diagnosis not present

## 2015-10-19 HISTORY — PX: KNEE ARTHROSCOPY: SHX127

## 2015-10-19 SURGERY — ARTHROSCOPY, KNEE
Anesthesia: General | Site: Knee | Laterality: Right

## 2015-10-19 MED ORDER — LACTATED RINGERS IV SOLN: INTRAVENOUS | Status: DC

## 2015-10-19 MED ORDER — OXYCODONE HCL 5 MG PO TABS: 5.0000 mg | ORAL_TABLET | ORAL | Status: DC | PRN

## 2015-10-19 MED ORDER — METHOCARBAMOL 500 MG PO TABS: 500.0000 mg | ORAL_TABLET | Freq: Four times a day (QID) | ORAL | Status: DC

## 2015-10-19 MED ORDER — DEXAMETHASONE SODIUM PHOSPHATE 10 MG/ML IJ SOLN
10.0000 mg | Freq: Once | INTRAMUSCULAR | Status: AC
  Administered 2015-10-19: 10 mg via INTRAVENOUS

## 2015-10-19 MED ORDER — SODIUM CHLORIDE 0.9 % IV SOLN: INTRAVENOUS | Status: DC

## 2015-10-19 MED ORDER — LACTATED RINGERS IR SOLN
Status: DC | PRN
  Administered 2015-10-19: 12000 mL

## 2015-10-19 MED ORDER — PROPOFOL 10 MG/ML IV BOLUS
INTRAVENOUS | Status: DC | PRN
  Administered 2015-10-19: 170 mg via INTRAVENOUS

## 2015-10-19 MED ORDER — BUPIVACAINE-EPINEPHRINE 0.25% -1:200000 IJ SOLN
INTRAMUSCULAR | Status: DC | PRN
  Administered 2015-10-19: 30 mL

## 2015-10-19 MED ORDER — ONDANSETRON HCL 4 MG/2ML IJ SOLN
INTRAMUSCULAR | Status: AC
  Filled 2015-10-19: qty 2

## 2015-10-19 MED ORDER — PROPOFOL 10 MG/ML IV BOLUS
INTRAVENOUS | Status: AC
  Filled 2015-10-19: qty 40

## 2015-10-19 MED ORDER — MIDAZOLAM HCL 2 MG/2ML IJ SOLN
INTRAMUSCULAR | Status: AC
  Filled 2015-10-19: qty 2

## 2015-10-19 MED ORDER — LIDOCAINE HCL (CARDIAC) 20 MG/ML IV SOLN
INTRAVENOUS | Status: DC | PRN
  Administered 2015-10-19: 100 mg via INTRAVENOUS

## 2015-10-19 MED ORDER — LACTATED RINGERS IV SOLN
INTRAVENOUS | Status: DC
  Administered 2015-10-19: 09:00:00 via INTRAVENOUS

## 2015-10-19 MED ORDER — DEXTROSE 5 % IV SOLN
500.0000 mg | Freq: Three times a day (TID) | INTRAVENOUS | Status: DC
  Administered 2015-10-19: 500 mg via INTRAVENOUS
  Filled 2015-10-19 (×6): qty 5

## 2015-10-19 MED ORDER — ACETAMINOPHEN 10 MG/ML IV SOLN
INTRAVENOUS | Status: AC
  Filled 2015-10-19: qty 100

## 2015-10-19 MED ORDER — FENTANYL CITRATE (PF) 100 MCG/2ML IJ SOLN
INTRAMUSCULAR | Status: AC
  Filled 2015-10-19: qty 2

## 2015-10-19 MED ORDER — HYDROMORPHONE HCL 2 MG/ML IJ SOLN
INTRAMUSCULAR | Status: AC
  Filled 2015-10-19: qty 1

## 2015-10-19 MED ORDER — CHLORHEXIDINE GLUCONATE 4 % EX LIQD: 60.0000 mL | Freq: Once | CUTANEOUS | Status: DC

## 2015-10-19 MED ORDER — ONDANSETRON HCL 4 MG/2ML IJ SOLN
INTRAMUSCULAR | Status: DC | PRN
  Administered 2015-10-19: 4 mg via INTRAVENOUS

## 2015-10-19 MED ORDER — SODIUM CHLORIDE 0.9 % IJ SOLN
INTRAMUSCULAR | Status: AC
  Filled 2015-10-19: qty 10

## 2015-10-19 MED ORDER — FENTANYL CITRATE (PF) 100 MCG/2ML IJ SOLN
25.0000 ug | INTRAMUSCULAR | Status: DC | PRN
  Administered 2015-10-19: 25 ug via INTRAVENOUS
  Administered 2015-10-19: 50 ug via INTRAVENOUS

## 2015-10-19 MED ORDER — EPHEDRINE SULFATE 50 MG/ML IJ SOLN
INTRAMUSCULAR | Status: AC
  Filled 2015-10-19: qty 1

## 2015-10-19 MED ORDER — POVIDONE-IODINE 10 % EX SWAB: 2.0000 "application " | Freq: Once | CUTANEOUS | Status: DC

## 2015-10-19 MED ORDER — HYDROMORPHONE HCL 1 MG/ML IJ SOLN
INTRAMUSCULAR | Status: DC | PRN
  Administered 2015-10-19: .4 mg via INTRAVENOUS
  Administered 2015-10-19 (×3): .2 mg via INTRAVENOUS

## 2015-10-19 MED ORDER — DEXAMETHASONE SODIUM PHOSPHATE 10 MG/ML IJ SOLN
INTRAMUSCULAR | Status: AC
  Filled 2015-10-19: qty 1

## 2015-10-19 MED ORDER — LIDOCAINE HCL (CARDIAC) 20 MG/ML IV SOLN
INTRAVENOUS | Status: AC
  Filled 2015-10-19: qty 5

## 2015-10-19 MED ORDER — FENTANYL CITRATE (PF) 100 MCG/2ML IJ SOLN
INTRAMUSCULAR | Status: DC | PRN
  Administered 2015-10-19 (×4): 50 ug via INTRAVENOUS

## 2015-10-19 MED ORDER — MIDAZOLAM HCL 5 MG/5ML IJ SOLN
INTRAMUSCULAR | Status: DC | PRN
  Administered 2015-10-19: 2 mg via INTRAVENOUS

## 2015-10-19 MED ORDER — ACETAMINOPHEN 10 MG/ML IV SOLN
1000.0000 mg | Freq: Once | INTRAVENOUS | Status: AC
  Administered 2015-10-19: 1000 mg via INTRAVENOUS

## 2015-10-19 MED ORDER — BUPIVACAINE-EPINEPHRINE (PF) 0.25% -1:200000 IJ SOLN
INTRAMUSCULAR | Status: AC
  Filled 2015-10-19: qty 30

## 2015-10-19 MED ORDER — LOSARTAN POTASSIUM 100 MG PO TABS: 100.0000 mg | ORAL_TABLET | Freq: Every evening | ORAL | Status: DC

## 2015-10-19 SURGICAL SUPPLY — 26 items
BANDAGE ACE 6X5 VEL STRL LF (GAUZE/BANDAGES/DRESSINGS) ×2 IMPLANT
BLADE 4.2CUDA (BLADE) ×2 IMPLANT
COVER SURGICAL LIGHT HANDLE (MISCELLANEOUS) ×2 IMPLANT
CUFF TOURN SGL QUICK 34 (TOURNIQUET CUFF) ×2
CUFF TRNQT CYL 34X4X40X1 (TOURNIQUET CUFF) ×1 IMPLANT
DRAPE U-SHAPE 47X51 STRL (DRAPES) ×2 IMPLANT
DRSG EMULSION OIL 3X3 NADH (GAUZE/BANDAGES/DRESSINGS) ×2 IMPLANT
DRSG PAD ABDOMINAL 8X10 ST (GAUZE/BANDAGES/DRESSINGS) ×2 IMPLANT
DURAPREP 26ML APPLICATOR (WOUND CARE) ×2 IMPLANT
GAUZE SPONGE 4X4 12PLY STRL (GAUZE/BANDAGES/DRESSINGS) ×2 IMPLANT
GLOVE BIO SURGEON STRL SZ8 (GLOVE) ×2 IMPLANT
GLOVE BIOGEL PI IND STRL 8 (GLOVE) ×1 IMPLANT
GLOVE BIOGEL PI INDICATOR 8 (GLOVE) ×1
GOWN STRL REUS W/TWL LRG LVL3 (GOWN DISPOSABLE) ×2 IMPLANT
KIT BASIN OR (CUSTOM PROCEDURE TRAY) ×2 IMPLANT
MANIFOLD NEPTUNE II (INSTRUMENTS) ×2 IMPLANT
MARKER SKIN DUAL TIP RULER LAB (MISCELLANEOUS) ×2 IMPLANT
PACK ARTHROSCOPY WL (CUSTOM PROCEDURE TRAY) ×2 IMPLANT
PACK ICE MAXI GEL EZY WRAP (MISCELLANEOUS) ×6 IMPLANT
PADDING CAST COTTON 6X4 STRL (CAST SUPPLIES) ×3 IMPLANT
POSITIONER SURGICAL ARM (MISCELLANEOUS) ×2 IMPLANT
SUT ETHILON 4 0 PS 2 18 (SUTURE) ×2 IMPLANT
TOWEL OR 17X26 10 PK STRL BLUE (TOWEL DISPOSABLE) ×2 IMPLANT
TUBING ARTHRO INFLOW-ONLY STRL (TUBING) ×2 IMPLANT
WAND HAND CNTRL MULTIVAC 90 (MISCELLANEOUS) ×1 IMPLANT
WRAP KNEE MAXI GEL POST OP (GAUZE/BANDAGES/DRESSINGS) ×2 IMPLANT

## 2015-10-19 NOTE — H&P (Signed)
CC- Courtney Nielsen is a 60 y.o. female who presents with right knee pain.  HPI- . Knee Pain: Patient presents with knee pain involving the  right knee. Onset of the symptoms was several months ago. Inciting event: none known. Current symptoms include pain located laterally. Pain is aggravated by going up and down stairs, lateral movements, rising after sitting, squatting and walking.  Patient has had no prior knee problems. Evaluation to date: MRI: abnormal lateral meniscal tear. Treatment to date: rest.  Past Medical History  Diagnosis Date  . Allergic rhinitis   . Colonic polyp   . Smoker   . Hypertension   . Vitamin B12 deficiency   . GERD (gastroesophageal reflux disease)   . Chest pain   . Dysrhythmia     hx of every third beat is " off" per patient   . COPD (chronic obstructive pulmonary disease) ([place])     patient denies at preop on 10/12/15   . Pneumonia     hx of 2014   . Arthritis     Past Surgical History  Procedure Laterality Date  . Colonoscopy    . Cardiac catheterization      cath negative per patient     Prior to Admission medications   Medication Sig Start Date End Date Taking? Authorizing Provider  acetaminophen (TYLENOL) 500 MG tablet Take 1,000 mg by mouth every 6 (six) hours as needed for moderate pain.   Yes Historical Provider, MD  ALPRAZolam Duanne Moron) 0.5 MG tablet Take 1 tablet (0.5 mg total) by mouth at bedtime as needed for anxiety. 09/12/15  Yes Aleksei Plotnikov V, MD  aspirin EC 81 MG tablet Take 81 mg by mouth every evening.   Yes Historical Provider, MD  celecoxib (CELEBREX) 200 MG capsule TAKE ONE CAPSULE BY MOUTH TWICE DAILY 04/20/15  Yes Aleksei Plotnikov V, MD  cholecalciferol (VITAMIN D) 1000 UNITS tablet Take 1,000 Units by mouth every evening.   Yes Historical Provider, MD  estradiol (ESTRACE) 1 MG tablet Take 0.5 mg by mouth every evening.    Yes Historical Provider, MD  Fish Oil-Cholecalciferol (FISH OIL + D3 PO) Take 1,000 mg by mouth  every evening.    Yes Historical Provider, MD  loratadine (CLARITIN) 10 MG tablet Take 10 mg by mouth at bedtime.   Yes Historical Provider, MD  losartan (COZAAR) 100 MG tablet TAKE 1 BY MOUTH DAILY Patient taking differently: Take one tablet by mouth every evening 08/02/15  Yes Aleksei Plotnikov V, MD  medroxyPROGESTERone (PROVERA) 2.5 MG tablet Take 2.5 mg by mouth every evening.    Yes Historical Provider, MD  vitamin B-12 (CYANOCOBALAMIN) 1000 MCG tablet Take 500 mcg by mouth every other day.    Yes Historical Provider, MD  Tapentadol HCl (NUCYNTA) 75 MG TABS Take 1 tablet by mouth 2 (two) times daily. Patient started on 10/10/2015 per patient    Historical Provider, MD   KNEE EXAM antalgic gait, soft tissue tenderness over lateral joint line, no effusion, negative pivot-shift, collateral ligaments intact  Physical Examination: General appearance - alert, well appearing, and in no distress Mental status - alert, oriented to person, place, and time Chest - clear to auscultation, no wheezes, rales or rhonchi, symmetric air entry Heart - normal rate, regular rhythm, normal S1, S2, no murmurs, rubs, clicks or gallops Abdomen - soft, nontender, nondistended, no masses or organomegaly Neurological - alert, oriented, normal speech, no focal findings or movement disorder noted   Asessment/Plan---Right knee lateral meniscal tear- -  Plan right knee arthroscopy with meniscal debridement. Procedure risks and potential comps discussed with patient who elects to proceed. Goals are decreased pain and increased function with a high likelihood of achieving both

## 2015-10-19 NOTE — Anesthesia Postprocedure Evaluation (Signed)
Anesthesia Post Note  Patient: Courtney Nielsen  Procedure(s) Performed: Procedure(s) (LRB): RIGHT KNEE SCOPE WITH MENISCAL DEBRIDEMENT,LATERAL SYNOVECTOMY (Right)  Patient location during evaluation: PACU Anesthesia Type: General Level of consciousness: awake and alert Pain management: pain level controlled Vital Signs Assessment: post-procedure vital signs reviewed and stable Respiratory status: spontaneous breathing, nonlabored ventilation, respiratory function stable and patient connected to nasal cannula oxygen Cardiovascular status: blood pressure returned to baseline and stable Postop Assessment: no signs of nausea or vomiting Anesthetic complications: no    Last Vitals:  Filed Vitals:   10/19/15 1115 10/19/15 1141  BP: 165/95 158/99  Pulse:    Temp:  36.3 C  Resp:  16    Last Pain:  Filed Vitals:   10/19/15 1143  PainSc: 5                  Izayah Miner L

## 2015-10-19 NOTE — Anesthesia Preprocedure Evaluation (Signed)
Anesthesia Evaluation  Patient identified by MRN, date of birth, ID band Patient awake    Reviewed: Allergy & Precautions, H&P , NPO status , Patient's Chart, lab work & pertinent test results  Airway Mallampati: II  TM Distance: >3 FB Neck ROM: full    Dental no notable dental hx. (+) Dental Advisory Given, Teeth Intact   Pulmonary COPD, former smoker,    Pulmonary exam normal breath sounds clear to auscultation       Cardiovascular Exercise Tolerance: Good hypertension, Pt. on medications negative cardio ROS Normal cardiovascular exam Rhythm:regular Rate:Normal  Bradycardia. syncope   Neuro/Psych Occipital neuralgia negative neurological ROS  negative psych ROS   GI/Hepatic negative GI ROS, Neg liver ROS,   Endo/Other  negative endocrine ROS  Renal/GU negative Renal ROS  negative genitourinary   Musculoskeletal   Abdominal   Peds  Hematology negative hematology ROS (+)   Anesthesia Other Findings   Reproductive/Obstetrics negative OB ROS                             Anesthesia Physical Anesthesia Plan  ASA: II  Anesthesia Plan: General   Post-op Pain Management:    Induction: Intravenous  Airway Management Planned: LMA  Additional Equipment:   Intra-op Plan:   Post-operative Plan:   Informed Consent: I have reviewed the patients History and Physical, chart, labs and discussed the procedure including the risks, benefits and alternatives for the proposed anesthesia with the patient or authorized representative who has indicated his/her understanding and acceptance.   Dental Advisory Given  Plan Discussed with: CRNA  Anesthesia Plan Comments:         Anesthesia Quick Evaluation

## 2015-10-19 NOTE — Progress Notes (Signed)
Ambulated to BR w walker w asst. Tolerated well

## 2015-10-19 NOTE — Anesthesia Procedure Notes (Signed)
Procedure Name: LMA Insertion Date/Time: 10/19/2015 9:33 AM Performed by: Christell Faith L Pre-anesthesia Checklist: Patient identified, Emergency Drugs available, Suction available, Patient being monitored and Timeout performed Patient Re-evaluated:Patient Re-evaluated prior to inductionOxygen Delivery Method: Circle system utilized Preoxygenation: Pre-oxygenation with 100% oxygen Intubation Type: IV induction Ventilation: Mask ventilation without difficulty LMA: LMA inserted LMA Size: 4.0 Number of attempts: 1 Tube secured with: Tape Dental Injury: Teeth and Oropharynx as per pre-operative assessment

## 2015-10-19 NOTE — Interval H&P Note (Signed)
History and Physical Interval Note:  10/19/2015 9:27 AM  Courtney Nielsen  has presented today for surgery, with the diagnosis of RIGHT KNEE LATERAL MENISCUS TEAR  The various methods of treatment have been discussed with the patient and family. After consideration of risks, benefits and other options for treatment, the patient has consented to  Procedure(s): RIGHT KNEE SCOPE WITH MENISCAL DEBRIDEMENT (Right) as a surgical intervention .  The patient's history has been reviewed, patient examined, no change in status, stable for surgery.  I have reviewed the patient's chart and labs.  Questions were answered to the patient's satisfaction.     [name]

## 2015-10-19 NOTE — Discharge Instructions (Signed)
° °Dr. Janee Ureste °Total Joint Specialist °[facility] °[address] °[place], [place] [address] °[phone] ° ° °Arthroscopic Procedure, Knee °An arthroscopic procedure can find what is wrong with your knee. °PROCEDURE °Arthroscopy is a surgical technique that allows your orthopedic surgeon to diagnose and treat your knee injury with accuracy. They will look into your knee through a small instrument. This is almost like a small (pencil sized) telescope. Because arthroscopy affects your knee less than open knee surgery, you can anticipate a more rapid recovery. Taking an active role by following your caregiver's instructions will help with rapid and complete recovery. Use crutches, rest, elevation, ice, and knee exercises as instructed. The length of recovery depends on various factors including type of injury, age, physical condition, medical conditions, and your rehabilitation. °Your knee is the joint between the large bones (femur and tibia) in your leg. Cartilage covers these bone ends which are smooth and slippery and allow your knee to bend and move smoothly. Two menisci, thick, semi-lunar shaped pads of cartilage which form a rim inside the joint, help absorb shock and stabilize your knee. Ligaments bind the bones together and support your knee joint. Muscles move the joint, help support your knee, and take stress off the joint itself. Because of this all programs and physical therapy to rehabilitate an injured or repaired knee require rebuilding and strengthening your muscles. °AFTER THE PROCEDURE °· After the procedure, you will be moved to a recovery area until most of the effects of the medication have worn off. Your caregiver will discuss the test results with you.  °· Only take over-the-counter or prescription medicines for pain, discomfort, or fever as directed by your caregiver.  °SEEK MEDICAL CARE IF:  °· You have increased bleeding from your wounds.  °· You see  redness, swelling, or have increasing pain in your wounds.  °· You have pus coming from your wound.  °· You have an oral temperature above 102° F (38.9° C).  °· You notice a bad smell coming from the wound or dressing.  °· You have severe pain with any motion of your knee.  °SEEK IMMEDIATE MEDICAL CARE IF:  °· You develop a rash.  °· You have difficulty breathing.  °· You have any allergic problems.  °FURTHER INSTRUCTIONS:  °· ICE to the affected knee every three hours for 30 minutes at a time and then as needed for pain and swelling.  Continue to use ice on the knee for pain and swelling from surgery. You may notice swelling that will progress down to the foot and ankle.  This is normal after surgery.  Elevate the leg when you are not up walking on it.   ° °DIET °You may resume your previous home diet once your are discharged from the hospital. ° °DRESSING / WOUND CARE / SHOWERING °You may start showering two days after being discharged home but do not submerge the incisions under water.  °Change dressing 48 hours after the procedure and then cover the small incisions with band aids until your follow up visit. °Change the surgical dressings daily and reapply a dry dressing each time.  ° °ACTIVITY °Walk with your walker as instructed. °Use walker as long as suggested by your caregivers. °Avoid periods of inactivity such as sitting longer than an hour when not asleep. This helps prevent blood clots.  °You may resume a sexual relationship in one month or when given the OK by your doctor.  °You may return to   work once you are cleared by your doctor.  °Do not drive a car for 6 weeks or until released by you surgeon.  °Do not drive while taking narcotics. ° °WEIGHT BEARING AS TOLERATED ° °POSTOPERATIVE CONSTIPATION PROTOCOL °Constipation - defined medically as fewer than three stools per week and severe constipation as less than one stool per week. ° °One of the most common issues patients have following surgery is  constipation.  Even if you have a regular bowel pattern at home, your normal regimen is likely to be disrupted due to multiple reasons following surgery.  Combination of anesthesia, postoperative narcotics, change in appetite and fluid intake all can affect your bowels.  In order to avoid complications following surgery, here are some recommendations in order to help you during your recovery period. ° °Colace (docusate) - Pick up an over-the-counter form of Colace or another stool softener and take twice a day as long as you are requiring postoperative pain medications.  Take with a full glass of water daily.  If you experience loose stools or diarrhea, hold the colace until you stool forms back up.  If your symptoms do not get better within 1 week or if they get worse, check with your doctor. ° °Dulcolax (bisacodyl) - Pick up over-the-counter and take as directed by the product packaging as needed to assist with the movement of your bowels.  Take with a full glass of water.  Use this product as needed if not relieved by Colace only.  ° °MiraLax (polyethylene glycol) - Pick up over-the-counter to have on hand.  MiraLax is a solution that will increase the amount of water in your bowels to assist with bowel movements.  Take as directed and can mix with a glass of water, juice, soda, coffee, or tea.  Take if you go more than two days without a movement. °Do not use MiraLax more than once per day. Call your doctor if you are still constipated or irregular after using this medication for 7 days in a row. ° °If you continue to have problems with postoperative constipation, please contact the office for further assistance and recommendations.  If you experience "the worst abdominal pain ever" or develop nausea or vomiting, please contact the office immediatly for further recommendations for treatment. ° °ITCHING ° If you experience itching with your medications, try taking only a single pain pill, or even half a pain pill  at a time.  You can also use Benadryl over the counter for itching or also to help with sleep.  ° °TED HOSE STOCKINGS °Wear the elastic stockings on both legs for three weeks following surgery during the day but you may remove then at night for sleeping. ° °MEDICATIONS °See your medication summary on the “After Visit Summary” that the nursing staff will review with you prior to discharge.  You may have some home medications which will be placed on hold until you complete the course of blood thinner medication.  It is important for you to complete the blood thinner medication as prescribed by your surgeon.  Continue your approved medications as instructed at time of discharge. °Do not drive while taking narcotics.  ° °PRECAUTIONS °If you experience chest pain or shortness of breath - call 911 immediately for transfer to the hospital emergency department.  °If you develop a fever greater that 101 F, purulent drainage from wound, increased redness or drainage from wound, foul odor from the wound/dressing, or calf pain - CONTACT YOUR SURGEON.   °                                                °  FOLLOW-UP APPOINTMENTS °Make sure you keep all of your appointments after your operation with your surgeon and caregivers. You should call the office at [phone]  and make an appointment for approximately one week after the date of your surgery or on the date instructed by your surgeon outlined in the "After Visit Summary". ° °RANGE OF MOTION AND STRENGTHENING EXERCISES  °Rehabilitation of the knee is important following a knee injury or an operation. After just a few days of immobilization, the muscles of the thigh which control the knee become weakened and shrink (atrophy). Knee exercises are designed to build up the tone and strength of the thigh muscles and to improve knee motion. Often times heat used for twenty to thirty minutes before working out will loosen up your tissues and help with improving the range of motion  but do not use heat for the first two weeks following surgery. These exercises can be done on a training (exercise) mat, on the floor, on a table or on a bed. Use what ever works the best and is most comfortable for you Knee exercises include: ° °QUAD STRENGTHENING EXERCISES °Strengthening Quadriceps Sets ° °Tighten muscles on top of thigh by pushing knees down into floor or table. °Hold for 20 seconds. Repeat 10 times. °Do 2 sessions per day. ° ° ° ° °Strengthening Terminal Knee Extension ° °With knee bent over bolster, straighten knee by tightening muscle on top of thigh. Be sure to keep bottom of knee on bolster. °Hold for 20 seconds. Repeat 10 times. °Do 2 sessions per day. ° ° °Straight Leg with Bent Knee ° °Lie on back with opposite leg bent. Keep involved knee slightly bent at knee and raise leg 4-6". Hold for 10 seconds. °Repeat 20 times per set. °Do 2 sets per session. °Do 2 sessions per day. ° °

## 2015-10-19 NOTE — Transfer of Care (Signed)
Immediate Anesthesia Transfer of Care Note  Patient: Courtney Nielsen  Procedure(s) Performed: Procedure(s): RIGHT KNEE SCOPE WITH MENISCAL DEBRIDEMENT,LATERAL SYNOVECTOMY (Right)  Patient Location: PACU  Anesthesia Type:General  Level of Consciousness:  sedated, patient cooperative and responds to stimulation  Airway & Oxygen Therapy:Patient Spontanous Breathing and Patient connected to face mask oxgen  Post-op Assessment:  Report given to PACU RN and Post -op Vital signs reviewed and stable  Post vital signs:  Reviewed and stable  Last Vitals:  Filed Vitals:   10/19/15 0835  BP: 158/79  Pulse: 85  Temp: 36.6 C  Resp: 16    Complications: No apparent anesthesia complications

## 2015-10-19 NOTE — Op Note (Signed)
Preoperative diagnosis-  Right knee lateral meniscal tear  Postoperative diagnosis Right- knee lateral meniscal tear  Procedure- Right knee arthroscopy with lateral  meniscal debridement    Surgeon- Dione Plover. Wilna Pennie, MD  Anesthesia-General  EBL-  Minimal  Complications- None  Condition- PACU - hemodynamically stable.  Brief clinical note- -Courtney Nielsen is a 60 y.o.  female with a several month history of right knee pain and mechanical symptoms. Exam and history suggested lateral meniscal tear confirmed by MRI. The patient presents now for arthroscopy and debridement   Procedure in detail -       After successful administration of General anesthetic, a tourmiquet is placed high on the Right  thigh and the Right lower extremity is prepped and draped in the usual sterile fashion. Time out is performed by the surgical team. Standard superomedial and inferolateral portal sites are marked and incisions made with an 11 blade. The inflow cannula is passed through the superomedial portal and camera through the inferolateral portal and inflow is initiated. Arthroscopic visualization proceeds.      The undersurface of the patella and trochlea are visualized and there is Grade II chondromalacia both surfaces but no chondral defects.. The medial and lateral gutters are visualized and there are  no loose bodies. Flexion and valgus force is applied to the knee and the medial compartment is entered. A spinal needle is passed into the joint through the site marked for the inferomedial portal. A small incision is made and the dilator passed into the joint. The findings for the medial compartment are normal .     The intercondylar notch is visualized and the ACL appears normal. The lateral compartment is entered and the findings are unstable tear of body, anterior and posterior horns of lateral meniscus with Grade II and III changes of lateral femoral condyle and tibial plateau . The tear is debrided to a  stable base with baskets and a shaver and sealed off with the Arthrocare. It is probed and found to be stable.     The joint is again inspected and there are no other tears, defects or loose bodies identified. The arthroscopic equipment is then removed from the inferior portals which are closed with interrupted 4-0 nylon. 20 ml of .25% Marcaine with epinephrine are injected through the inflow cannula and the cannula is then removed and the portal closed with nylon. The incisions are cleaned and dried and a bulky sterile dressing is applied. The patient is then awakened and transported to recovery in stable condition.   10/19/2015, 10:23 AM

## 2015-12-20 DIAGNOSIS — M1711 Unilateral primary osteoarthritis, right knee: Secondary | ICD-10-CM | POA: Diagnosis not present

## 2015-12-20 DIAGNOSIS — M1611 Unilateral primary osteoarthritis, right hip: Secondary | ICD-10-CM | POA: Diagnosis not present

## 2015-12-20 DIAGNOSIS — Z4789 Encounter for other orthopedic aftercare: Secondary | ICD-10-CM | POA: Diagnosis not present

## 2015-12-22 DIAGNOSIS — M1612 Unilateral primary osteoarthritis, left hip: Secondary | ICD-10-CM | POA: Diagnosis not present

## 2016-02-01 DIAGNOSIS — Z1231 Encounter for screening mammogram for malignant neoplasm of breast: Secondary | ICD-10-CM | POA: Diagnosis not present

## 2016-02-01 DIAGNOSIS — Z6833 Body mass index (BMI) 33.0-33.9, adult: Secondary | ICD-10-CM | POA: Diagnosis not present

## 2016-02-01 DIAGNOSIS — Z01419 Encounter for gynecological examination (general) (routine) without abnormal findings: Secondary | ICD-10-CM | POA: Diagnosis not present

## 2016-02-01 LAB — HM MAMMOGRAPHY

## 2016-02-03 DIAGNOSIS — M1711 Unilateral primary osteoarthritis, right knee: Secondary | ICD-10-CM | POA: Diagnosis not present

## 2016-02-03 DIAGNOSIS — M1612 Unilateral primary osteoarthritis, left hip: Secondary | ICD-10-CM | POA: Diagnosis not present

## 2016-02-06 LAB — HM MAMMOGRAPHY

## 2016-02-27 ENCOUNTER — Emergency Department (HOSPITAL_COMMUNITY): Payer: BLUE CROSS/BLUE SHIELD

## 2016-02-27 ENCOUNTER — Telehealth: Payer: Self-pay

## 2016-02-27 ENCOUNTER — Telehealth: Payer: Self-pay | Admitting: Emergency Medicine

## 2016-02-27 ENCOUNTER — Emergency Department (HOSPITAL_COMMUNITY)
Admission: EM | Admit: 2016-02-27 | Discharge: 2016-02-27 | Disposition: A | Discharge status: Home / Self Care | Payer: BLUE CROSS/BLUE SHIELD | Attending: Emergency Medicine | Admitting: Emergency Medicine

## 2016-02-27 ENCOUNTER — Telehealth: Payer: Self-pay | Admitting: Internal Medicine

## 2016-02-27 ENCOUNTER — Encounter (HOSPITAL_COMMUNITY): Payer: Self-pay | Admitting: Physical Medicine and Rehabilitation

## 2016-02-27 DIAGNOSIS — I10 Essential (primary) hypertension: Secondary | ICD-10-CM | POA: Diagnosis not present

## 2016-02-27 DIAGNOSIS — R1011 Right upper quadrant pain: Secondary | ICD-10-CM

## 2016-02-27 DIAGNOSIS — J449 Chronic obstructive pulmonary disease, unspecified: Secondary | ICD-10-CM | POA: Insufficient documentation

## 2016-02-27 DIAGNOSIS — Z7982 Long term (current) use of aspirin: Secondary | ICD-10-CM | POA: Diagnosis not present

## 2016-02-27 DIAGNOSIS — R109 Unspecified abdominal pain: Secondary | ICD-10-CM | POA: Diagnosis not present

## 2016-02-27 DIAGNOSIS — M5431 Sciatica, right side: Secondary | ICD-10-CM | POA: Diagnosis not present

## 2016-02-27 DIAGNOSIS — Z87891 Personal history of nicotine dependence: Secondary | ICD-10-CM | POA: Insufficient documentation

## 2016-02-27 DIAGNOSIS — Z79899 Other long term (current) drug therapy: Secondary | ICD-10-CM | POA: Diagnosis not present

## 2016-02-27 LAB — URINALYSIS, ROUTINE W REFLEX MICROSCOPIC
Bilirubin Urine: NEGATIVE
GLUCOSE, UA: NEGATIVE mg/dL
Hgb urine dipstick: NEGATIVE
KETONES UR: NEGATIVE mg/dL
LEUKOCYTES UA: NEGATIVE
NITRITE: NEGATIVE
PH: 6 (ref 5.0–8.0)
Protein, ur: NEGATIVE mg/dL
SPECIFIC GRAVITY, URINE: 1.013 (ref 1.005–1.030)

## 2016-02-27 LAB — CBC WITH DIFFERENTIAL/PLATELET
BASOS ABS: 0.1 10*3/uL (ref 0.0–0.1)
BASOS PCT: 1 %
EOS ABS: 0.3 10*3/uL (ref 0.0–0.7)
EOS PCT: 3 %
HEMATOCRIT: 41.9 % (ref 36.0–46.0)
Hemoglobin: 14 g/dL (ref 12.0–15.0)
Lymphocytes Relative: 32 %
Lymphs Abs: 2.9 10*3/uL (ref 0.7–4.0)
MCH: 30.6 pg (ref 26.0–34.0)
MCHC: 33.4 g/dL (ref 30.0–36.0)
MCV: 91.7 fL (ref 78.0–100.0)
MONO ABS: 0.5 10*3/uL (ref 0.1–1.0)
MONOS PCT: 6 %
Neutro Abs: 5.3 10*3/uL (ref 1.7–7.7)
Neutrophils Relative %: 58 %
PLATELETS: 255 10*3/uL (ref 150–400)
RBC: 4.57 MIL/uL (ref 3.87–5.11)
RDW: 13.1 % (ref 11.5–15.5)
WBC: 9 10*3/uL (ref 4.0–10.5)

## 2016-02-27 LAB — COMPREHENSIVE METABOLIC PANEL
ALBUMIN: 3.9 g/dL (ref 3.5–5.0)
ALK PHOS: 78 U/L (ref 38–126)
ALT: 23 U/L (ref 14–54)
AST: 19 U/L (ref 15–41)
Anion gap: 8 (ref 5–15)
BILIRUBIN TOTAL: 1.1 mg/dL (ref 0.3–1.2)
BUN: 12 mg/dL (ref 6–20)
CALCIUM: 9.3 mg/dL (ref 8.9–10.3)
CO2: 24 mmol/L (ref 22–32)
CREATININE: 0.97 mg/dL (ref 0.44–1.00)
Chloride: 106 mmol/L (ref 101–111)
GFR calc Af Amer: >60 mL/min (ref >60)
GLUCOSE: 107 mg/dL — AB (ref 65–99)
POTASSIUM: 4.1 mmol/L (ref 3.5–5.1)
Sodium: 138 mmol/L (ref 135–145)
TOTAL PROTEIN: 6.5 g/dL (ref 6.5–8.1)

## 2016-02-27 LAB — LIPASE, BLOOD: LIPASE: 35 U/L (ref 11–51)

## 2016-02-27 MED ORDER — PROMETHAZINE HCL 25 MG/ML IJ SOLN
12.5000 mg | INTRAMUSCULAR | Status: DC | PRN
  Administered 2016-02-27: 12.5 mg via INTRAVENOUS
  Filled 2016-02-27: qty 1

## 2016-02-27 MED ORDER — KETOROLAC TROMETHAMINE 30 MG/ML IJ SOLN
15.0000 mg | Freq: Once | INTRAMUSCULAR | Status: AC
  Administered 2016-02-27: 15 mg via INTRAVENOUS
  Filled 2016-02-27: qty 1

## 2016-02-27 MED ORDER — FENTANYL CITRATE (PF) 100 MCG/2ML IJ SOLN
25.0000 ug | Freq: Once | INTRAMUSCULAR | Status: AC
  Administered 2016-02-27: 25 ug via INTRAVENOUS
  Filled 2016-02-27: qty 2

## 2016-02-27 MED ORDER — HYDROMORPHONE HCL 1 MG/ML IJ SOLN
1.0000 mg | Freq: Once | INTRAMUSCULAR | Status: AC
  Administered 2016-02-27: 1 mg via INTRAVENOUS
  Filled 2016-02-27: qty 1

## 2016-02-27 MED ORDER — METHOCARBAMOL 500 MG PO TABS: 500.0000 mg | ORAL_TABLET | Freq: Four times a day (QID) | ORAL | 1 refills | Status: DC

## 2016-02-27 MED ORDER — OXYCODONE HCL 5 MG PO TABS: 5.0000 mg | ORAL_TABLET | ORAL | 0 refills | Status: DC | PRN

## 2016-02-27 MED ORDER — ONDANSETRON HCL 4 MG/2ML IJ SOLN
4.0000 mg | Freq: Once | INTRAMUSCULAR | Status: AC
  Administered 2016-02-27: 4 mg via INTRAVENOUS
  Filled 2016-02-27: qty 2

## 2016-02-27 NOTE — ED Notes (Signed)
Pt returned from ct and ambulated to the bathroom without difficulty, placed back on heart monitor

## 2016-02-27 NOTE — ED Provider Notes (Signed)
[facility] DEPT Provider Note   CSN: [id number] Arrival date & time: 02/27/16  [id number]     History   Chief Complaint Chief Complaint  Patient presents with  . Flank Pain    HPI Courtney Nielsen is a 60 y.o. female.  HPI Patient presents with one-day history of right flank pain which is been fairly severe.  Rates it a 9 out of 10.  Has had increased urination but no dysuria.  No nausea or vomiting.  Pain is not relieved with position.  Not made worse with position.  No history of kidney stones. Past Medical History:  Diagnosis Date  . Allergic rhinitis   . Arthritis   . Chest pain   . Colonic polyp   . COPD (chronic obstructive pulmonary disease) ([place])    patient denies at preop on 10/12/15   . Dysrhythmia    hx of every third beat is " off" per patient   . GERD (gastroesophageal reflux disease)   . Hypertension   . Pneumonia    hx of 2014   . Smoker   . Vitamin B12 deficiency     Patient Active Problem List   Diagnosis Date Noted  . Lateral meniscal tear 10/19/2015  . Left hip pain 07/22/2013  . Occipital neuralgia 06/04/2013  . Leg pain 02/22/2012  . Gout 02/22/2012  . Weight gain 01/11/2012  . Edema leg 06/07/2011  . Pruritus 04/23/2011  . Well adult exam 04/23/2011  . Pneumonia, organism unspecified 04/21/2011  . Cough 04/19/2011  . Paresthesia 09/26/2010  . NECK PAIN 08/22/2010  . BACK PAIN, THORACIC REGION 08/22/2010  . LOW BACK PAIN, ACUTE 08/22/2010  . DIZZINESS 08/22/2010  . CERUMEN IMPACTION 07/21/2010  . CRAMP OF LIMB 03/16/2010  . BRONCHITIS, ACUTE 07/22/2009  . COPD 07/22/2009  . MEDIASTINAL LYMPHADENOPATHY 08/16/2008  . Nonspecific (abnormal) findings on radiological and other examination of body structure 08/16/2008  . CT, CHEST, ABNORMAL 08/16/2008  . CHEST PAIN 06/24/2008  . BRADYCARDIA 06/23/2008  . GERD 06/23/2008  . SYNCOPE 06/23/2008  . B12 deficiency 04/09/2008  . Pain in Soft Tissues of Limb 01/08/2008  . RASH AND OTHER  NONSPECIFIC SKIN ERUPTION 01/08/2008  . SINUSITIS, ACUTE 10/31/2007  . HIP PAIN 10/31/2007  . TOBACCO USE DISORDER/SMOKER-SMOKING CESSATION DISCUSSED 04/21/2007  . Essential hypertension 04/21/2007  . MENOPAUSAL SYNDROME 04/21/2007  . ALLERGIC RHINITIS 04/19/2007  . COLONIC POLYPS, HX OF 04/19/2007    Past Surgical History:  Procedure Laterality Date  . CARDIAC CATHETERIZATION     cath negative per patient   . COLONOSCOPY    . KNEE ARTHROSCOPY Right 10/19/2015   Procedure: RIGHT KNEE SCOPE WITH MENISCAL DEBRIDEMENT,LATERAL SYNOVECTOMY;  Surgeon: Gaynelle Arabian, MD;  Location: [facility];  Service: Orthopedics;  Laterality: Right;    OB History    No data available       Home Medications    Prior to Admission medications   Medication Sig Start Date End Date Taking? Authorizing Provider  acetaminophen (TYLENOL) 500 MG tablet Take 1,000 mg by mouth every 6 (six) hours as needed for moderate pain.   Yes Historical Provider, MD  ALPRAZolam Duanne Moron) 0.5 MG tablet Take 1 tablet (0.5 mg total) by mouth at bedtime as needed for anxiety. 09/12/15  Yes Evie Lacks Plotnikov, MD  aspirin EC 81 MG tablet Take 81 mg by mouth every evening.   Yes Historical Provider, MD  celecoxib (CELEBREX) 200 MG capsule TAKE ONE CAPSULE BY MOUTH TWICE DAILY Patient taking differently: Take 200  mg by mouth twice daily 04/20/15  Yes Cassandria Anger, MD  cholecalciferol (VITAMIN D) 1000 UNITS tablet Take 1,000 Units by mouth every evening.   Yes Historical Provider, MD  estradiol (ESTRACE) 0.5 MG tablet Take 0.5 mg by mouth every evening.    Yes Historical Provider, MD  Fish Oil-Cholecalciferol (FISH OIL + D3 PO) Take 1,000 mg by mouth every evening.    Yes Historical Provider, MD  loratadine (CLARITIN) 10 MG tablet Take 10 mg by mouth at bedtime.   Yes Historical Provider, MD  losartan (COZAAR) 100 MG tablet Take 1 tablet (100 mg total) by mouth every evening. 10/19/15  Yes Gaynelle Arabian, MD  medroxyPROGESTERone  (PROVERA) 2.5 MG tablet Take 2.5 mg by mouth every evening.    Yes Historical Provider, MD  Menthol, Topical Analgesic, (BLUE-EMU MAXIMUM STRENGTH EX) Apply 1 application topically as needed (for pain).   Yes Historical Provider, MD  vitamin B-12 (CYANOCOBALAMIN) 1000 MCG tablet Take 500 mcg by mouth every other day.    Yes Historical Provider, MD  methocarbamol (ROBAXIN) 500 MG tablet Take 1 tablet (500 mg total) by mouth 4 (four) times daily. As needed for muscle spasm 02/27/16   Leonard Schwartz, MD  oxyCODONE (ROXICODONE) 5 MG immediate release tablet Take 1-2 tablets (5-10 mg total) by mouth every 4 (four) hours as needed for severe pain. 02/27/16   Leonard Schwartz, MD    Family History Family History  Problem Relation Age of Onset  . Arthritis Mother   . Heart disease Mother   . COPD Father   . Cancer Father     Throat and stomach  . Arthritis Father   . Lung cancer Father   . Lymphoma Father   . Asthma Brother   . Emphysema Paternal Grandfather   . Hypertension Other   . Diabetes Maternal Aunt     Social History Social History  Substance Use Topics  . Smoking status: Former Smoker    Types: Cigarettes    Quit date: 07/03/2011  . Smokeless tobacco: Never Used  . Alcohol use No     Allergies   Meloxicam; Ceftin [cefuroxime axetil]; Coreg [carvedilol]; Furosemide; Prednisone; Sulfonamide derivatives; and Verapamil   Review of Systems Review of Systems All other systems reviewed and are negative  Physical Exam Updated Vital Signs BP 155/99   Pulse 76   Temp 97.9 F (36.6 C) (Oral)   Resp 18   Ht 5\' 5"  (1.651 m)   Wt 230 lb (104.3 kg)   SpO2 96%   BMI 38.27 kg/m   Physical Exam  Physical Exam  Nursing note and vitals reviewed. Constitutional: She is oriented to person, place, and time. She appears well-developed and well-nourished.  Patient appears mildly distressed with right flank and low back pain.   HENT:  Head: Normocephalic and atraumatic.  Eyes: Pupils  are equal, round, and reactive to light.  Neck: Normal range of motion.  Cardiovascular: Normal rate and intact distal pulses.   Pulmonary/Chest: No respiratory distress.  Abdominal: Normal appearance. She exhibits no distension.  Musculoskeletal: Normal range of motion.Pain reproducible to palpation of right sciatic notch  Neurological: She is alert and oriented to person, place, and time. No cranial nerve deficit.  Skin: Skin is warm and dry. No rash noted.  Psychiatric: She has a normal mood and affect. Her behavior is normal.   ED Treatments / Results  Labs (all labs ordered are listed, but only abnormal results are displayed) Labs Reviewed  COMPREHENSIVE METABOLIC  PANEL - Abnormal; Notable for the following:       Result Value   Glucose, Bld 107 (*)    All other components within normal limits  URINALYSIS, ROUTINE W REFLEX MICROSCOPIC (NOT AT [facility])  LIPASE, BLOOD  CBC WITH DIFFERENTIAL/PLATELET    EKG  EKG Interpretation None       Radiology Ct Renal Stone Study  Result Date: 02/27/2016 CLINICAL DATA:  Right flank pain. EXAM: CT ABDOMEN AND PELVIS WITHOUT CONTRAST TECHNIQUE: Multidetector CT imaging of the abdomen and pelvis was performed following the standard protocol without IV contrast. COMPARISON:  None. FINDINGS: Lower chest: Lung bases are clear. No effusions. Heart is normal size. Hepatobiliary: Scattered hypodensities throughout the liver, likely small cysts. Small layering gallstone within the gallbladder. No biliary duct dilatation. Pancreas: No focal abnormality or ductal dilatation. Spleen: No focal abnormality.  Normal size. Adrenals/Urinary Tract: No adrenal abnormality. No focal renal abnormality. No stones or hydronephrosis. Urinary bladder is unremarkable. Stomach/Bowel: Appendix is normal. Sigmoid diverticulosis. No active diverticulitis. Stomach and small bowel unremarkable. Vascular/Lymphatic: Aortic and iliac calcifications diffusely. No aneurysm. No  adenopathy. Reproductive: Uterus and adnexa unremarkable.  No mass. Other: No free fluid or free air. Musculoskeletal: No acute bony abnormality. Degenerative changes in the mid lumbar spine. IMPRESSION: No renal or ureteral stones.  No hydronephrosis. Small layering gallstone. Sigmoid diverticulosis. No acute findings. Aortoiliac atherosclerosis. Electronically Signed   By: Rolm Baptise M.D.   On: 02/27/2016 11:51   US Abdomen Limited Ruq  Result Date: 02/27/2016 CLINICAL DATA:  Right upper quadrant pain. EXAM: US ABDOMEN LIMITED - RIGHT UPPER QUADRANT COMPARISON:  CT 02/27/2016 FINDINGS: Gallbladder: Previously seen small layering stone by CT cannot be appreciated by ultrasound. No wall thickening. Negative sonographic Murphy's. Common bile duct: Diameter: Normal caliber, 3 mm Liver: Small cysts scattered throughout the liver as seen on CT. No biliary duct dilatation. Normal echotexture. IMPRESSION: No visible stones by ultrasound as seen by CT. No evidence of cholecystitis. Electronically Signed   By: Rolm Baptise M.D.   On: 02/27/2016 13:14    Procedures Procedures (including critical care time)  Medications Ordered in ED Medications  ketorolac (TORADOL) 30 MG/ML injection 15 mg (not administered)  ondansetron (ZOFRAN) injection 4 mg (4 mg Intravenous Given 02/27/16 1129)  fentaNYL (SUBLIMAZE) injection 25 mcg (25 mcg Intravenous Given 02/27/16 1129)  HYDROmorphone (DILAUDID) injection 1 mg (1 mg Intravenous Given 02/27/16 1227)     Initial Impression / Assessment and Plan / ED Course  I have reviewed the triage vital signs and the nursing notes.  Patient Pertinent labs & imaging results that were available during my care of the patient were reviewed by me and considered in my medical decision making (see chart for details).  Clinical Course      Final Clinical Impressions(s) / ED Diagnoses   Final diagnoses:  Sciatica of right side    New Prescriptions Current Discharge  Medication List       Leonard Schwartz, MD 02/27/16 1340

## 2016-02-27 NOTE — Telephone Encounter (Signed)
Agree w/ER Thx

## 2016-02-27 NOTE — ED Triage Notes (Signed)
Pt reports R sided flank pain x1 day. 9/10 pain upon arrival to ED. No nausea/vomiting/diarrhea. Denies urinary symptoms.

## 2016-02-27 NOTE — Telephone Encounter (Signed)
PLEASE NOTE: All timestamps contained within this report are represented as [place] Standard Time. CONFIDENTIALTY NOTICE: This fax transmission is intended only for the addressee. It contains information that is legally privileged, confidential or otherwise protected from use or disclosure. If you are not the intended recipient, you are strictly prohibited from reviewing, disclosing, copying using or disseminating any of this information or taking any action in reliance on or regarding this information. If you have received this fax in error, please notify us immediately by telephone so that we can arrange for its return to [place]. Phone: [phone], Toll-Free: [phone], Fax: [phone] Page: 1 of 1 Call Id: [id number] [facility] [place] Patient Name: Courtney Nielsen DOB: [birth year] Initial Comment Caller states she thinks she may have a Kidney Stone, Lower back pain. Nurse Assessment Nurse: Dimas Chyle, RN, Dellis Filbert Date/Time Eilene Ghazi Time): 02/27/2016 8:16:08 AM Confirm and document reason for call. If symptomatic, describe symptoms. You must click the next button to save text entered. ---Caller states she thinks she may have a kidney stone, right lower back pain. Symptoms started yesterday. No fever. No blood in urine or pain with urination. Has the patient traveled out of the country within the last 30 days? ---No Does the patient have any new or worsening symptoms? ---Yes Will a triage be completed? ---Yes Related visit to physician within the last 2 weeks? ---No Does the PT have any chronic conditions? (i.e. diabetes, asthma, etc.) ---Yes List chronic conditions. ---HTN Is this a behavioral health or substance abuse call? ---No Guidelines Guideline Title Affirmed Question Affirmed Notes Flank Pain [1] SEVERE pain (e.g., excruciating, scale 8-10) AND [2] not improved after pain medicine Final Disposition  User Go to ED Now Dimas Chyle, RN, Dellis Filbert Comments Severe right flank pain 9 on a 0-10 scale. Referrals [facility] - ED Disagree/Comply: Comply

## 2016-02-27 NOTE — ED Notes (Signed)
Pt returned from us and placed on monitor 

## 2016-02-27 NOTE — Telephone Encounter (Signed)
Patient called in to check status of order. Advised that dr plotnikov has the note and we are waiting on his word

## 2016-02-27 NOTE — ED Notes (Signed)
Pt was not suppose to check into ED, having outpatient procedure today.

## 2016-02-27 NOTE — Telephone Encounter (Signed)
error 

## 2016-02-27 NOTE — Telephone Encounter (Signed)
Patient called back again about this. I informed her after speaking with Erline Levine that we can not do the orders. He has not seen her since April. I informed her that the ed would need to be the ones that put in her orders. CT is going to take her back down to the ED and help her get in fast so she can get these orders. IF they have any other questions they will call us back. Thank you.

## 2016-02-27 NOTE — ED Notes (Signed)
Pt began feeling nauseous, er md notified, will continue to monitor prior to discharge

## 2016-02-27 NOTE — Telephone Encounter (Signed)
Pts husband called and states wife has possible kidney stones. She has been sent over to [facility] for at CT scan and wants to know if you can put the order in. Husband asked if you can call him back when the order is put in. Please follow up thanks.

## 2016-03-09 ENCOUNTER — Telehealth: Payer: Self-pay | Admitting: Emergency Medicine

## 2016-03-09 ENCOUNTER — Other Ambulatory Visit: Payer: Self-pay | Admitting: Internal Medicine

## 2016-03-09 DIAGNOSIS — Z0000 Encounter for general adult medical examination without abnormal findings: Secondary | ICD-10-CM

## 2016-03-09 NOTE — Telephone Encounter (Signed)
OK will do Thx

## 2016-03-09 NOTE — Telephone Encounter (Signed)
Pt wants to know if she can get an order for her blood work. She is going to come in Monday to have that done before her CPE. Thanks.

## 2016-03-12 ENCOUNTER — Other Ambulatory Visit (INDEPENDENT_AMBULATORY_CARE_PROVIDER_SITE_OTHER): Payer: BLUE CROSS/BLUE SHIELD

## 2016-03-12 DIAGNOSIS — Z0000 Encounter for general adult medical examination without abnormal findings: Secondary | ICD-10-CM

## 2016-03-12 LAB — BASIC METABOLIC PANEL
BUN: 13 mg/dL (ref 6–23)
CO2: 27 mEq/L (ref 19–32)
Calcium: 9 mg/dL (ref 8.4–10.5)
Chloride: 106 mEq/L (ref 96–112)
Creatinine, Ser: 1.02 mg/dL (ref 0.40–1.20)
GFR: 58.76 mL/min — AB (ref >60.00)
Glucose, Bld: 96 mg/dL (ref 70–99)
POTASSIUM: 3.8 meq/L (ref 3.5–5.1)
SODIUM: 141 meq/L (ref 135–145)

## 2016-03-12 LAB — LIPID PANEL
CHOL/HDL RATIO: 5
Cholesterol: 205 mg/dL — ABNORMAL HIGH (ref 0–200)
HDL: 44.4 mg/dL (ref >39.00)
LDL CALC: 132 mg/dL — AB (ref 0–99)
NonHDL: 160.69
TRIGLYCERIDES: 145 mg/dL (ref 0.0–149.0)
VLDL: 29 mg/dL (ref 0.0–40.0)

## 2016-03-12 LAB — CBC WITH DIFFERENTIAL/PLATELET
Basophils Absolute: 0.1 10*3/uL (ref 0.0–0.1)
Basophils Relative: 1 % (ref 0.0–3.0)
EOS PCT: 6.4 % — AB (ref 0.0–5.0)
Eosinophils Absolute: 0.5 10*3/uL (ref 0.0–0.7)
HCT: 43.2 % (ref 36.0–46.0)
HEMOGLOBIN: 14.6 g/dL (ref 12.0–15.0)
LYMPHS PCT: 34.7 % (ref 12.0–46.0)
Lymphs Abs: 2.6 10*3/uL (ref 0.7–4.0)
MCHC: 33.8 g/dL (ref 30.0–36.0)
MCV: 91.5 fl (ref 78.0–100.0)
MONOS PCT: 8 % (ref 3.0–12.0)
Monocytes Absolute: 0.6 10*3/uL (ref 0.1–1.0)
Neutro Abs: 3.8 10*3/uL (ref 1.4–7.7)
Neutrophils Relative %: 49.9 % (ref 43.0–77.0)
Platelets: 251 10*3/uL (ref 150.0–400.0)
RBC: 4.72 Mil/uL (ref 3.87–5.11)
RDW: 14.1 % (ref 11.5–15.5)
WBC: 7.6 10*3/uL (ref 4.0–10.5)

## 2016-03-12 LAB — URINALYSIS
BILIRUBIN URINE: NEGATIVE
Ketones, ur: NEGATIVE
Leukocytes, UA: NEGATIVE
NITRITE: NEGATIVE
PH: 6 (ref 5.0–8.0)
Specific Gravity, Urine: 1.02 (ref 1.000–1.030)
Urine Glucose: NEGATIVE
Urobilinogen, UA: 0.2 (ref 0.0–1.0)

## 2016-03-12 LAB — HEPATIC FUNCTION PANEL
ALBUMIN: 4 g/dL (ref 3.5–5.2)
ALK PHOS: 79 U/L (ref 39–117)
ALT: 18 U/L (ref 0–35)
AST: 15 U/L (ref 0–37)
Bilirubin, Direct: 0.1 mg/dL (ref 0.0–0.3)
TOTAL PROTEIN: 6.9 g/dL (ref 6.0–8.3)
Total Bilirubin: 0.8 mg/dL (ref 0.2–1.2)

## 2016-03-12 LAB — HEPATITIS C ANTIBODY: HCV AB: REACTIVE — AB

## 2016-03-12 LAB — TSH: TSH: 0.7 u[IU]/mL (ref 0.35–4.50)

## 2016-03-14 LAB — HEPATITIS C RNA QUANTITATIVE: HCV Quantitative: NOT DETECTED IU/mL (ref <15)

## 2016-03-15 ENCOUNTER — Encounter: Payer: Self-pay | Admitting: Internal Medicine

## 2016-03-15 ENCOUNTER — Ambulatory Visit (INDEPENDENT_AMBULATORY_CARE_PROVIDER_SITE_OTHER): Payer: BLUE CROSS/BLUE SHIELD | Admitting: Internal Medicine

## 2016-03-15 VITALS — BP 130/84 | HR 88 | Ht 70.0 in | Wt 230.0 lb

## 2016-03-15 DIAGNOSIS — M5441 Lumbago with sciatica, right side: Secondary | ICD-10-CM | POA: Diagnosis not present

## 2016-03-15 DIAGNOSIS — R600 Localized edema: Secondary | ICD-10-CM | POA: Diagnosis not present

## 2016-03-15 DIAGNOSIS — E538 Deficiency of other specified B group vitamins: Secondary | ICD-10-CM | POA: Diagnosis not present

## 2016-03-15 DIAGNOSIS — R1032 Left lower quadrant pain: Secondary | ICD-10-CM | POA: Diagnosis not present

## 2016-03-15 DIAGNOSIS — Z0000 Encounter for general adult medical examination without abnormal findings: Secondary | ICD-10-CM | POA: Diagnosis not present

## 2016-03-15 DIAGNOSIS — Z23 Encounter for immunization: Secondary | ICD-10-CM

## 2016-03-15 MED ORDER — METRONIDAZOLE 500 MG PO TABS: 500.0000 mg | ORAL_TABLET | Freq: Three times a day (TID) | ORAL | 0 refills | Status: DC

## 2016-03-15 MED ORDER — LOSARTAN POTASSIUM 100 MG PO TABS: 100.0000 mg | ORAL_TABLET | Freq: Every evening | ORAL | 3 refills | Status: DC

## 2016-03-15 MED ORDER — ALPRAZOLAM 0.5 MG PO TABS: 0.5000 mg | ORAL_TABLET | Freq: Every evening | ORAL | 3 refills | Status: DC | PRN

## 2016-03-15 MED ORDER — LEVOFLOXACIN 500 MG PO TABS: 500.0000 mg | ORAL_TABLET | Freq: Every day | ORAL | 0 refills | Status: DC

## 2016-03-15 MED ORDER — LEVOFLOXACIN 500 MG PO TABS: 500.0000 mg | ORAL_TABLET | Freq: Every day | ORAL | 0 refills | Status: AC

## 2016-03-15 NOTE — Addendum Note (Signed)
Addended by: Cresenciano Lick on: 03/15/2016 09:05 AM   Modules accepted: Orders

## 2016-03-15 NOTE — Assessment & Plan Note (Signed)
?  diverticulitis Low residue diet Levaquin x 10 d Flagyl x 7 d

## 2016-03-15 NOTE — Assessment & Plan Note (Addendum)
We discussed age appropriate health related issues, including available/recomended screening tests and vaccinations. We discussed a need for adhering to healthy diet and exercise. Labs/EKG were reviewed/ordered. All questions were answered. GYN q 12 mo Colon due 2018 False positive Hep C Ab test

## 2016-03-15 NOTE — Progress Notes (Signed)
Subjective:  Patient ID: Courtney Nielsen, female    DOB: Nov 11, 1955  Age: 60 y.o. MRN: [id number]  CC: Annual Exam   HPI Courtney Nielsen presents for a well exam. C/o lot of arthralgias, anxiety, B12 def f/u. She was at the ER for sciatica - X rays reviewed. C/o fever; LLQ abd pain x 4 d.    Outpatient Medications Prior to Visit  Medication Sig Dispense Refill  . acetaminophen (TYLENOL) 500 MG tablet Take 1,000 mg by mouth every 6 (six) hours as needed for moderate pain.    Marland Kitchen ALPRAZolam (XANAX) 0.5 MG tablet Take 1 tablet (0.5 mg total) by mouth at bedtime as needed for anxiety. 90 tablet 3  . aspirin EC 81 MG tablet Take 81 mg by mouth every evening.    . celecoxib (CELEBREX) 200 MG capsule TAKE ONE CAPSULE BY MOUTH TWICE DAILY (Patient taking differently: Take 200 mg by mouth twice daily) 60 capsule 11  . cholecalciferol (VITAMIN D) 1000 UNITS tablet Take 1,000 Units by mouth every evening.    Marland Kitchen estradiol (ESTRACE) 0.5 MG tablet Take 0.5 mg by mouth every evening.     Marland Kitchen Fish Oil-Cholecalciferol (FISH OIL + D3 PO) Take 1,000 mg by mouth every evening.     . loratadine (CLARITIN) 10 MG tablet Take 10 mg by mouth at bedtime.    Marland Kitchen losartan (COZAAR) 100 MG tablet Take 1 tablet (100 mg total) by mouth every evening. 90 tablet 1  . medroxyPROGESTERone (PROVERA) 2.5 MG tablet Take 2.5 mg by mouth every evening.     . Menthol, Topical Analgesic, (BLUE-EMU MAXIMUM STRENGTH EX) Apply 1 application topically as needed (for pain).    . vitamin B-12 (CYANOCOBALAMIN) 1000 MCG tablet Take 500 mcg by mouth every other day.     . methocarbamol (ROBAXIN) 500 MG tablet Take 1 tablet (500 mg total) by mouth 4 (four) times daily. As needed for muscle spasm (Patient not taking: Reported on 03/15/2016) 30 tablet 1  . oxyCODONE (ROXICODONE) 5 MG immediate release tablet Take 1-2 tablets (5-10 mg total) by mouth every 4 (four) hours as needed for severe pain. (Patient not taking: Reported on 03/15/2016) 20  tablet 0   No facility-administered medications prior to visit.     ROS Review of Systems  Constitutional: Positive for fatigue. Negative for activity change, appetite change, chills and unexpected weight change.  HENT: Negative for congestion, mouth sores and sinus pressure.   Eyes: Negative for visual disturbance.  Respiratory: Negative for cough and chest tightness.   Gastrointestinal: Negative for abdominal pain and nausea.  Genitourinary: Negative for difficulty urinating, frequency and vaginal pain.  Musculoskeletal: Positive for arthralgias, back pain and gait problem.  Skin: Negative for pallor and rash.  Neurological: Negative for dizziness, tremors, weakness, numbness and headaches.  Psychiatric/Behavioral: Negative for confusion and sleep disturbance.    Objective:  BP 130/84   Pulse 88   Ht 5\' 10"  (1.778 m)   Wt 230 lb (104.3 kg)   SpO2 95%   BMI 33.00 kg/m   BP Readings from Last 3 Encounters:  03/15/16 130/84  02/27/16 145/95  10/19/15 (!) 155/85    Wt Readings from Last 3 Encounters:  03/15/16 230 lb (104.3 kg)  02/27/16 230 lb (104.3 kg)  10/19/15 235 lb (106.6 kg)    Physical Exam  Constitutional: She appears well-developed. No distress.  HENT:  Head: Normocephalic.  Right Ear: External ear normal.  Left Ear: External ear normal.  Nose: Nose  normal.  Mouth/Throat: Oropharynx is clear and moist.  Eyes: Conjunctivae are normal. Pupils are equal, round, and reactive to light. Right eye exhibits no discharge. Left eye exhibits no discharge.  Neck: Normal range of motion. Neck supple. No JVD present. No tracheal deviation present. No thyromegaly present.  Cardiovascular: Normal rate, regular rhythm and normal heart sounds.   Pulmonary/Chest: No stridor. No respiratory distress. She has no wheezes.  Abdominal: Soft. Bowel sounds are normal. She exhibits no distension and no mass. There is no tenderness. There is no rebound and no guarding.    Musculoskeletal: She exhibits tenderness. She exhibits no edema.  Lymphadenopathy:    She has no cervical adenopathy.  Neurological: She displays normal reflexes. No cranial nerve deficit. She exhibits normal muscle tone. Coordination normal.  Skin: No rash noted. No erythema.  Psychiatric: She has a normal mood and affect. Her behavior is normal. Judgment and thought content normal.  LLQ is tender  Lab Results  Component Value Date   WBC 7.6 03/12/2016   HGB 14.6 03/12/2016   HCT 43.2 03/12/2016   PLT 251.0 03/12/2016   GLUCOSE 96 03/12/2016   CHOL 205 (H) 03/12/2016   TRIG 145.0 03/12/2016   HDL 44.40 03/12/2016   LDLDIRECT 164.6 01/05/2008   LDLCALC 132 (H) 03/12/2016   ALT 18 03/12/2016   AST 15 03/12/2016   NA 141 03/12/2016   K 3.8 03/12/2016   CL 106 03/12/2016   CREATININE 1.02 03/12/2016   BUN 13 03/12/2016   CO2 27 03/12/2016   TSH 0.70 03/12/2016   HGBA1C 5.6 03/02/2014    Ct Renal Stone Study  Result Date: 02/27/2016 CLINICAL DATA:  Right flank pain. EXAM: CT ABDOMEN AND PELVIS WITHOUT CONTRAST TECHNIQUE: Multidetector CT imaging of the abdomen and pelvis was performed following the standard protocol without IV contrast. COMPARISON:  None. FINDINGS: Lower chest: Lung bases are clear. No effusions. Heart is normal size. Hepatobiliary: Scattered hypodensities throughout the liver, likely small cysts. Small layering gallstone within the gallbladder. No biliary duct dilatation. Pancreas: No focal abnormality or ductal dilatation. Spleen: No focal abnormality.  Normal size. Adrenals/Urinary Tract: No adrenal abnormality. No focal renal abnormality. No stones or hydronephrosis. Urinary bladder is unremarkable. Stomach/Bowel: Appendix is normal. Sigmoid diverticulosis. No active diverticulitis. Stomach and small bowel unremarkable. Vascular/Lymphatic: Aortic and iliac calcifications diffusely. No aneurysm. No adenopathy. Reproductive: Uterus and adnexa unremarkable.  No mass.  Other: No free fluid or free air. Musculoskeletal: No acute bony abnormality. Degenerative changes in the mid lumbar spine. IMPRESSION: No renal or ureteral stones.  No hydronephrosis. Small layering gallstone. Sigmoid diverticulosis. No acute findings. Aortoiliac atherosclerosis. Electronically Signed   By: Rolm Baptise M.D.   On: 02/27/2016 11:51   US Abdomen Limited Ruq  Result Date: 02/27/2016 CLINICAL DATA:  Right upper quadrant pain. EXAM: US ABDOMEN LIMITED - RIGHT UPPER QUADRANT COMPARISON:  CT 02/27/2016 FINDINGS: Gallbladder: Previously seen small layering stone by CT cannot be appreciated by ultrasound. No wall thickening. Negative sonographic Murphy's. Common bile duct: Diameter: Normal caliber, 3 mm Liver: Small cysts scattered throughout the liver as seen on CT. No biliary duct dilatation. Normal echotexture. IMPRESSION: No visible stones by ultrasound as seen by CT. No evidence of cholecystitis. Electronically Signed   By: Rolm Baptise M.D.   On: 02/27/2016 13:14    Assessment & Plan:   There are no diagnoses linked to this encounter. I am having Ms. Rudden maintain her estradiol, medroxyPROGESTERone, aspirin EC, cholecalciferol, vitamin B-12, Fish Oil-Cholecalciferol (FISH  OIL + D3 PO), loratadine, celecoxib, ALPRAZolam, acetaminophen, losartan, (Menthol, Topical Analgesic, (BLUE-EMU MAXIMUM STRENGTH EX)), methocarbamol, and oxyCODONE.  No orders of the defined types were placed in this encounter.    Follow-up: No Follow-up on file.  Walker Kehr, MD

## 2016-03-15 NOTE — Patient Instructions (Signed)
Diverticulitis °Diverticulitis is inflammation or infection of small pouches in your colon that form when you have a condition called diverticulosis. The pouches in your colon are called diverticula. Your colon, or large intestine, is where water is absorbed and stool is formed. °Complications of diverticulitis can include: °· Bleeding. °· Severe infection. °· Severe pain. °· Perforation of your colon. °· Obstruction of your colon. °CAUSES  °Diverticulitis is caused by bacteria. °Diverticulitis happens when stool becomes trapped in diverticula. This allows bacteria to grow in the diverticula, which can lead to inflammation and infection. °RISK FACTORS °People with diverticulosis are at risk for diverticulitis. Eating a diet that does not include enough fiber from fruits and vegetables may make diverticulitis more likely to develop. °SYMPTOMS  °Symptoms of diverticulitis may include: °· Abdominal pain and tenderness. The pain is normally located on the left side of the abdomen, but may occur in other areas. °· Fever and chills. °· Bloating. °· Cramping. °· Nausea. °· Vomiting. °· Constipation. °· Diarrhea. °· Blood in your stool. °DIAGNOSIS  °Your health care provider will ask you about your medical history and do a physical exam. You may need to have tests done because many medical conditions can cause the same symptoms as diverticulitis. Tests may include: °· Blood tests. °· Urine tests. °· Imaging tests of the abdomen, including X-rays and CT scans. °When your condition is under control, your health care provider may recommend that you have a colonoscopy. A colonoscopy can show how severe your diverticula are and whether something else is causing your symptoms. °TREATMENT  °Most cases of diverticulitis are mild and can be treated at home. Treatment may include: °· Taking over-the-counter pain medicines. °· Following a clear liquid diet. °· Taking antibiotic medicines by mouth for 7-10 days. °More severe cases may  be treated at a hospital. Treatment may include: °· Not eating or drinking. °· Taking prescription pain medicine. °· Receiving antibiotic medicines through an IV tube. °· Receiving fluids and nutrition through an IV tube. °· Surgery. °HOME CARE INSTRUCTIONS  °· Follow your health care provider's instructions carefully. °· Follow a full liquid diet or other diet as directed by your health care provider. After your symptoms improve, your health care provider may tell you to change your diet. He or she may recommend you eat a high-fiber diet. Fruits and vegetables are good sources of fiber. Fiber makes it easier to pass stool. °· Take fiber supplements or probiotics as directed by your health care provider. °· Only take medicines as directed by your health care provider. °· Keep all your follow-up appointments. °SEEK MEDICAL CARE IF:  °· Your pain does not improve. °· You have a hard time eating food. °· Your bowel movements do not return to normal. °SEEK IMMEDIATE MEDICAL CARE IF:  °· Your pain becomes worse. °· Your symptoms do not get better. °· Your symptoms suddenly get worse. °· You have a fever. °· You have repeated vomiting. °· You have bloody or black, tarry stools. °MAKE SURE YOU:  °· Understand these instructions. °· Will watch your condition. °· Will get help right away if you are not doing well or get worse. °  °This information is not intended to replace advice given to you by your health care provider. Make sure you discuss any questions you have with your health care provider. °  °Document Released: 03/28/2005 Document Revised: 06/23/2013 Document Reviewed: 05/13/2013 °Elsevier Interactive Patient Education ©2016 [employer] ° °

## 2016-03-15 NOTE — Assessment & Plan Note (Signed)
On B12 

## 2016-03-15 NOTE — Assessment & Plan Note (Signed)
Stand up desk

## 2016-03-15 NOTE — Progress Notes (Signed)
Pre visit review using our clinic review tool, if applicable. No additional management support is needed unless otherwise documented below in the visit note. 

## 2016-03-15 NOTE — Assessment & Plan Note (Signed)
Resolved

## 2016-03-21 ENCOUNTER — Encounter: Payer: Self-pay | Admitting: Internal Medicine

## 2016-03-21 DIAGNOSIS — M1711 Unilateral primary osteoarthritis, right knee: Secondary | ICD-10-CM | POA: Diagnosis not present

## 2016-03-28 DIAGNOSIS — M1711 Unilateral primary osteoarthritis, right knee: Secondary | ICD-10-CM | POA: Diagnosis not present

## 2016-04-04 DIAGNOSIS — M1711 Unilateral primary osteoarthritis, right knee: Secondary | ICD-10-CM | POA: Diagnosis not present

## 2016-04-11 ENCOUNTER — Encounter: Payer: Self-pay | Admitting: Internal Medicine

## 2016-04-11 ENCOUNTER — Ambulatory Visit (INDEPENDENT_AMBULATORY_CARE_PROVIDER_SITE_OTHER): Payer: BLUE CROSS/BLUE SHIELD | Admitting: Internal Medicine

## 2016-04-11 DIAGNOSIS — G8929 Other chronic pain: Secondary | ICD-10-CM

## 2016-04-11 DIAGNOSIS — R1032 Left lower quadrant pain: Secondary | ICD-10-CM | POA: Diagnosis not present

## 2016-04-11 DIAGNOSIS — M5441 Lumbago with sciatica, right side: Secondary | ICD-10-CM | POA: Diagnosis not present

## 2016-04-11 NOTE — Progress Notes (Signed)
Pre visit review using our clinic review tool, if applicable. No additional management support is needed unless otherwise documented below in the visit note. 

## 2016-04-11 NOTE — Assessment & Plan Note (Signed)
Chronic sx's Celebrex, Tylenol prn

## 2016-04-11 NOTE — Progress Notes (Signed)
Subjective:  Patient ID: Courtney Nielsen, female    DOB: 1955-11-05  Age: 60 y.o. MRN: [id number]  CC: Diverticulitis (4 weeks follow up Pt stated doing much better.)   HPI Courtney Nielsen presents for diverticulitis f/u. The pt had head itching from Levaquin - stopped after 3 days. She finished Flagyl.  Outpatient Medications Prior to Visit  Medication Sig Dispense Refill  . acetaminophen (TYLENOL) 500 MG tablet Take 1,000 mg by mouth every 6 (six) hours as needed for moderate pain.    Marland Kitchen ALPRAZolam (XANAX) 0.5 MG tablet Take 1 tablet (0.5 mg total) by mouth at bedtime as needed for anxiety. 90 tablet 3  . aspirin EC 81 MG tablet Take 81 mg by mouth every evening.    . celecoxib (CELEBREX) 200 MG capsule TAKE ONE CAPSULE BY MOUTH TWICE DAILY (Patient taking differently: Take 200 mg by mouth twice daily) 60 capsule 11  . cholecalciferol (VITAMIN D) 1000 UNITS tablet Take 1,000 Units by mouth every evening.    Marland Kitchen estradiol (ESTRACE) 0.5 MG tablet Take 0.5 mg by mouth every evening.     Marland Kitchen Fish Oil-Cholecalciferol (FISH OIL + D3 PO) Take 1,000 mg by mouth every evening.     . loratadine (CLARITIN) 10 MG tablet Take 10 mg by mouth at bedtime.    Marland Kitchen losartan (COZAAR) 100 MG tablet Take 1 tablet (100 mg total) by mouth every evening. 90 tablet 3  . medroxyPROGESTERone (PROVERA) 2.5 MG tablet Take 2.5 mg by mouth every evening.     . Menthol, Topical Analgesic, (BLUE-EMU MAXIMUM STRENGTH EX) Apply 1 application topically as needed (for pain).    . methocarbamol (ROBAXIN) 500 MG tablet Take 1 tablet (500 mg total) by mouth 4 (four) times daily. As needed for muscle spasm 30 tablet 1  . vitamin B-12 (CYANOCOBALAMIN) 1000 MCG tablet Take 500 mcg by mouth every other day.     . metroNIDAZOLE (FLAGYL) 500 MG tablet Take 1 tablet (500 mg total) by mouth 3 (three) times daily. (Patient not taking: Reported on 04/11/2016) 21 tablet 0   No facility-administered medications prior to visit.      ROS Review of Systems  Constitutional: Negative for activity change, appetite change, chills, fatigue and unexpected weight change.  HENT: Negative for congestion, mouth sores and sinus pressure.   Eyes: Negative for visual disturbance.  Respiratory: Negative for cough and chest tightness.   Gastrointestinal: Negative for abdominal pain and nausea.  Genitourinary: Negative for difficulty urinating, frequency and vaginal pain.  Musculoskeletal: Positive for arthralgias and back pain. Negative for gait problem.  Skin: Negative for pallor and rash.  Neurological: Negative for dizziness, tremors, weakness, numbness and headaches.  Psychiatric/Behavioral: Negative for confusion and sleep disturbance.    Objective:  BP 126/82   Pulse 86   Temp 97.6 F (36.4 C)   Ht 5\' 10"  (1.778 m)   Wt 230 lb (104.3 kg)   SpO2 98%   BMI 33.00 kg/m   BP Readings from Last 3 Encounters:  04/11/16 126/82  03/15/16 130/84  02/27/16 145/95    Wt Readings from Last 3 Encounters:  04/11/16 230 lb (104.3 kg)  03/15/16 230 lb (104.3 kg)  02/27/16 230 lb (104.3 kg)    Physical Exam  Constitutional: She appears well-developed. No distress.  HENT:  Head: Normocephalic.  Right Ear: External ear normal.  Left Ear: External ear normal.  Nose: Nose normal.  Mouth/Throat: Oropharynx is clear and moist.  Eyes: Conjunctivae are normal. Pupils  are equal, round, and reactive to light. Right eye exhibits no discharge. Left eye exhibits no discharge.  Neck: Normal range of motion. Neck supple. No JVD present. No tracheal deviation present. No thyromegaly present.  Cardiovascular: Normal rate, regular rhythm and normal heart sounds.   Pulmonary/Chest: No stridor. No respiratory distress. She has no wheezes.  Abdominal: Soft. Bowel sounds are normal. She exhibits no distension and no mass. There is no tenderness. There is no rebound and no guarding.  Musculoskeletal: She exhibits tenderness. She exhibits no  edema.  Lymphadenopathy:    She has no cervical adenopathy.  Neurological: She displays normal reflexes. No cranial nerve deficit. She exhibits normal muscle tone. Coordination normal.  Skin: No rash noted. No erythema.  Psychiatric: She has a normal mood and affect. Her behavior is normal. Judgment and thought content normal.  LS tender  Lab Results  Component Value Date   WBC 7.6 03/12/2016   HGB 14.6 03/12/2016   HCT 43.2 03/12/2016   PLT 251.0 03/12/2016   GLUCOSE 96 03/12/2016   CHOL 205 (H) 03/12/2016   TRIG 145.0 03/12/2016   HDL 44.40 03/12/2016   LDLDIRECT 164.6 01/05/2008   LDLCALC 132 (H) 03/12/2016   ALT 18 03/12/2016   AST 15 03/12/2016   NA 141 03/12/2016   K 3.8 03/12/2016   CL 106 03/12/2016   CREATININE 1.02 03/12/2016   BUN 13 03/12/2016   CO2 27 03/12/2016   TSH 0.70 03/12/2016   HGBA1C 5.6 03/02/2014    Ct Renal Stone Study  Result Date: 02/27/2016 CLINICAL DATA:  Right flank pain. EXAM: CT ABDOMEN AND PELVIS WITHOUT CONTRAST TECHNIQUE: Multidetector CT imaging of the abdomen and pelvis was performed following the standard protocol without IV contrast. COMPARISON:  None. FINDINGS: Lower chest: Lung bases are clear. No effusions. Heart is normal size. Hepatobiliary: Scattered hypodensities throughout the liver, likely small cysts. Small layering gallstone within the gallbladder. No biliary duct dilatation. Pancreas: No focal abnormality or ductal dilatation. Spleen: No focal abnormality.  Normal size. Adrenals/Urinary Tract: No adrenal abnormality. No focal renal abnormality. No stones or hydronephrosis. Urinary bladder is unremarkable. Stomach/Bowel: Appendix is normal. Sigmoid diverticulosis. No active diverticulitis. Stomach and small bowel unremarkable. Vascular/Lymphatic: Aortic and iliac calcifications diffusely. No aneurysm. No adenopathy. Reproductive: Uterus and adnexa unremarkable.  No mass. Other: No free fluid or free air. Musculoskeletal: No acute  bony abnormality. Degenerative changes in the mid lumbar spine. IMPRESSION: No renal or ureteral stones.  No hydronephrosis. Small layering gallstone. Sigmoid diverticulosis. No acute findings. Aortoiliac atherosclerosis. Electronically Signed   By: Rolm Baptise M.D.   On: 02/27/2016 11:51   US Abdomen Limited Ruq  Result Date: 02/27/2016 CLINICAL DATA:  Right upper quadrant pain. EXAM: US ABDOMEN LIMITED - RIGHT UPPER QUADRANT COMPARISON:  CT 02/27/2016 FINDINGS: Gallbladder: Previously seen small layering stone by CT cannot be appreciated by ultrasound. No wall thickening. Negative sonographic Murphy's. Common bile duct: Diameter: Normal caliber, 3 mm Liver: Small cysts scattered throughout the liver as seen on CT. No biliary duct dilatation. Normal echotexture. IMPRESSION: No visible stones by ultrasound as seen by CT. No evidence of cholecystitis. Electronically Signed   By: Rolm Baptise M.D.   On: 02/27/2016 13:14    Assessment & Plan:   There are no diagnoses linked to this encounter. I have discontinued Ms. Thai's metroNIDAZOLE. I am also having her maintain her estradiol, medroxyPROGESTERone, aspirin EC, cholecalciferol, vitamin B-12, Fish Oil-Cholecalciferol (FISH OIL + D3 PO), loratadine, celecoxib, acetaminophen, (Menthol, Topical Analgesic, (  BLUE-EMU MAXIMUM STRENGTH EX)), methocarbamol, ALPRAZolam, and losartan.  No orders of the defined types were placed in this encounter.    Follow-up: No Follow-up on file.  Walker Kehr, MD

## 2016-04-11 NOTE — Assessment & Plan Note (Signed)
Resolved The pt had head itching from Levaquin - stopped after 3 days. She finished Flagyl.

## 2016-04-17 DIAGNOSIS — Z23 Encounter for immunization: Secondary | ICD-10-CM | POA: Diagnosis not present

## 2016-04-26 ENCOUNTER — Other Ambulatory Visit: Payer: Self-pay | Admitting: Internal Medicine

## 2016-05-07 DIAGNOSIS — H25092 Other age-related incipient cataract, left eye: Secondary | ICD-10-CM | POA: Diagnosis not present

## 2016-05-07 DIAGNOSIS — H25811 Combined forms of age-related cataract, right eye: Secondary | ICD-10-CM | POA: Diagnosis not present

## 2016-05-07 DIAGNOSIS — H348322 Tributary (branch) retinal vein occlusion, left eye, stable: Secondary | ICD-10-CM | POA: Diagnosis not present

## 2016-05-31 DIAGNOSIS — M1711 Unilateral primary osteoarthritis, right knee: Secondary | ICD-10-CM | POA: Diagnosis not present

## 2016-06-01 ENCOUNTER — Encounter (HOSPITAL_COMMUNITY)
Admission: RE | Admit: 2016-06-01 | Discharge: 2016-06-01 | Disposition: A | Discharge status: Home / Self Care | Payer: BLUE CROSS/BLUE SHIELD | Source: Ambulatory Visit | Attending: Ophthalmology | Admitting: Ophthalmology

## 2016-06-01 ENCOUNTER — Encounter (HOSPITAL_COMMUNITY): Payer: Self-pay

## 2016-06-01 DIAGNOSIS — Z01818 Encounter for other preprocedural examination: Secondary | ICD-10-CM | POA: Diagnosis not present

## 2016-06-01 DIAGNOSIS — H269 Unspecified cataract: Secondary | ICD-10-CM | POA: Insufficient documentation

## 2016-06-01 NOTE — Patient Instructions (Signed)
Your procedure is scheduled on: 06/11/2016  Report to [facility] at   22   AM.  Call this number if you have problems the morning of surgery: [phone]   Do not eat food or drink liquids :After Midnight.      Take these medicines the morning of surgery with A SIP OF WATER: xanax, clebrex, claritin, lozaar, robaxin.   Do not wear jewelry, make-up or nail polish.  Do not wear lotions, powders, or perfumes. You may wear deodorant.  Do not shave 48 hours prior to surgery.  Do not bring valuables to the hospital.  Contacts, dentures or bridgework may not be worn into surgery.  Leave suitcase in the car. After surgery it may be brought to your room.  For patients admitted to the hospital, checkout time is 11:00 AM the day of discharge.   Patients discharged the day of surgery will not be allowed to drive home.  :     Please read over the following fact sheets that you were given: Coughing and Deep Breathing, Surgical Site Infection Prevention, Anesthesia Post-op Instructions and Care and Recovery After Surgery    Cataract A cataract is a clouding of the lens of the eye. When a lens becomes cloudy, vision is reduced based on the degree and nature of the clouding. Many cataracts reduce vision to some degree. Some cataracts make people more near-sighted as they develop. Other cataracts increase glare. Cataracts that are ignored and become worse can sometimes look white. The white color can be seen through the pupil. CAUSES   Aging. However, cataracts may occur at any age, even in newborns.   Certain drugs.   Trauma to the eye.   Certain diseases such as diabetes.   Specific eye diseases such as chronic inflammation inside the eye or a sudden attack of a rare form of glaucoma.   Inherited or acquired medical problems.  SYMPTOMS   Gradual, progressive drop in vision in the affected eye.   Severe, rapid visual loss. This most often happens when trauma is the cause.  DIAGNOSIS  To  detect a cataract, an eye doctor examines the lens. Cataracts are best diagnosed with an exam of the eyes with the pupils enlarged (dilated) by drops.  TREATMENT  For an early cataract, vision may improve by using different eyeglasses or stronger lighting. If that does not help your vision, surgery is the only effective treatment. A cataract needs to be surgically removed when vision loss interferes with your everyday activities, such as driving, reading, or watching TV. A cataract may also have to be removed if it prevents examination or treatment of another eye problem. Surgery removes the cloudy lens and usually replaces it with a substitute lens (intraocular lens, IOL).  At a time when both you and your doctor agree, the cataract will be surgically removed. If you have cataracts in both eyes, only one is usually removed at a time. This allows the operated eye to heal and be out of danger from any possible problems after surgery (such as infection or poor wound healing). In rare cases, a cataract may be doing damage to your eye. In these cases, your caregiver may advise surgical removal right away. The vast majority of people who have cataract surgery have better vision afterward. HOME CARE INSTRUCTIONS  If you are not planning surgery, you may be asked to do the following:  Use different eyeglasses.   Use stronger or brighter lighting.   Ask your eye doctor  about reducing your medicine dose or changing medicines if it is thought that a medicine caused your cataract. Changing medicines does not make the cataract go away on its own.   Become familiar with your surroundings. Poor vision can lead to injury. Avoid bumping into things on the affected side. You are at a higher risk for tripping or falling.   Exercise extreme care when driving or operating machinery.   Wear sunglasses if you are sensitive to bright light or experiencing problems with glare.  SEEK IMMEDIATE MEDICAL CARE IF:   You have  a worsening or sudden vision loss.   You notice redness, swelling, or increasing pain in the eye.   You have a fever.  Document Released: 06/18/2005 Document Revised: 06/07/2011 Document Reviewed: 02/09/2011 [facility] Patient Information 2012 [place].PATIENT INSTRUCTIONS POST-ANESTHESIA  IMMEDIATELY FOLLOWING SURGERY:  Do not drive or operate machinery for the first twenty four hours after surgery.  Do not make any important decisions for twenty four hours after surgery or while taking narcotic pain medications or sedatives.  If you develop intractable nausea and vomiting or a severe headache please notify your doctor immediately.  FOLLOW-UP:  Please make an appointment with your surgeon as instructed. You do not need to follow up with anesthesia unless specifically instructed to do so.  WOUND CARE INSTRUCTIONS (if applicable):  Keep a dry clean dressing on the anesthesia/puncture wound site if there is drainage.  Once the wound has quit draining you may leave it open to air.  Generally you should leave the bandage intact for twenty four hours unless there is drainage.  If the epidural site drains for more than 36-48 hours please call the anesthesia department.  QUESTIONS?:  Please feel free to call your physician or the hospital operator if you have any questions, and they will be happy to assist you.

## 2016-06-06 ENCOUNTER — Other Ambulatory Visit (HOSPITAL_COMMUNITY): Payer: BLUE CROSS/BLUE SHIELD

## 2016-06-11 ENCOUNTER — Ambulatory Visit (HOSPITAL_COMMUNITY)
Admission: RE | Admit: 2016-06-11 | Discharge: 2016-06-11 | Disposition: A | Discharge status: Home / Self Care | Payer: BLUE CROSS/BLUE SHIELD | Source: Ambulatory Visit | Attending: Ophthalmology | Admitting: Ophthalmology

## 2016-06-11 ENCOUNTER — Encounter (HOSPITAL_COMMUNITY)
Admission: RE | Disposition: A | Discharge status: Home / Self Care | Payer: Self-pay | Source: Ambulatory Visit | Attending: Ophthalmology

## 2016-06-11 ENCOUNTER — Ambulatory Visit (HOSPITAL_COMMUNITY): Payer: BLUE CROSS/BLUE SHIELD | Admitting: Anesthesiology

## 2016-06-11 ENCOUNTER — Encounter (HOSPITAL_COMMUNITY): Payer: Self-pay | Admitting: Ophthalmology

## 2016-06-11 DIAGNOSIS — J449 Chronic obstructive pulmonary disease, unspecified: Secondary | ICD-10-CM | POA: Insufficient documentation

## 2016-06-11 DIAGNOSIS — Z7982 Long term (current) use of aspirin: Secondary | ICD-10-CM | POA: Insufficient documentation

## 2016-06-11 DIAGNOSIS — Z791 Long term (current) use of non-steroidal anti-inflammatories (NSAID): Secondary | ICD-10-CM | POA: Insufficient documentation

## 2016-06-11 DIAGNOSIS — F419 Anxiety disorder, unspecified: Secondary | ICD-10-CM | POA: Insufficient documentation

## 2016-06-11 DIAGNOSIS — I10 Essential (primary) hypertension: Secondary | ICD-10-CM | POA: Diagnosis not present

## 2016-06-11 DIAGNOSIS — Z79899 Other long term (current) drug therapy: Secondary | ICD-10-CM | POA: Insufficient documentation

## 2016-06-11 DIAGNOSIS — H25811 Combined forms of age-related cataract, right eye: Secondary | ICD-10-CM | POA: Diagnosis not present

## 2016-06-11 DIAGNOSIS — Z79890 Hormone replacement therapy (postmenopausal): Secondary | ICD-10-CM | POA: Insufficient documentation

## 2016-06-11 DIAGNOSIS — H269 Unspecified cataract: Secondary | ICD-10-CM | POA: Diagnosis not present

## 2016-06-11 DIAGNOSIS — Z87891 Personal history of nicotine dependence: Secondary | ICD-10-CM | POA: Diagnosis not present

## 2016-06-11 HISTORY — PX: CATARACT EXTRACTION W/PHACO: SHX586

## 2016-06-11 SURGERY — PHACOEMULSIFICATION, CATARACT, WITH IOL INSERTION
Anesthesia: Monitor Anesthesia Care | Site: Eye | Laterality: Right

## 2016-06-11 MED ORDER — BSS IO SOLN
INTRAOCULAR | Status: DC | PRN
  Administered 2016-06-11: 15 mL

## 2016-06-11 MED ORDER — CYCLOPENTOLATE-PHENYLEPHRINE 0.2-1 % OP SOLN
1.0000 [drp] | OPHTHALMIC | Status: AC
  Administered 2016-06-11 (×3): 1 [drp] via OPHTHALMIC

## 2016-06-11 MED ORDER — LIDOCAINE HCL (PF) 1 % IJ SOLN
INTRAMUSCULAR | Status: DC | PRN
  Administered 2016-06-11: .4 mL

## 2016-06-11 MED ORDER — EPINEPHRINE PF 1 MG/ML IJ SOLN
INTRAMUSCULAR | Status: AC
  Filled 2016-06-11: qty 1

## 2016-06-11 MED ORDER — EPINEPHRINE PF 1 MG/ML IJ SOLN
INTRAMUSCULAR | Status: DC | PRN
  Administered 2016-06-11: 500 mL

## 2016-06-11 MED ORDER — TETRACAINE HCL 0.5 % OP SOLN
1.0000 [drp] | OPHTHALMIC | Status: AC
  Administered 2016-06-11 (×3): 1 [drp] via OPHTHALMIC

## 2016-06-11 MED ORDER — POVIDONE-IODINE 5 % OP SOLN
OPHTHALMIC | Status: DC | PRN
  Administered 2016-06-11: 1 via OPHTHALMIC

## 2016-06-11 MED ORDER — MIDAZOLAM HCL 2 MG/2ML IJ SOLN
1.0000 mg | INTRAMUSCULAR | Status: DC | PRN
  Administered 2016-06-11: 2 mg via INTRAVENOUS

## 2016-06-11 MED ORDER — FENTANYL CITRATE (PF) 100 MCG/2ML IJ SOLN
25.0000 ug | INTRAMUSCULAR | Status: AC | PRN
  Administered 2016-06-11 (×2): 25 ug via INTRAVENOUS

## 2016-06-11 MED ORDER — MIDAZOLAM HCL 2 MG/2ML IJ SOLN
INTRAMUSCULAR | Status: AC
  Filled 2016-06-11: qty 2

## 2016-06-11 MED ORDER — FENTANYL CITRATE (PF) 100 MCG/2ML IJ SOLN
INTRAMUSCULAR | Status: AC
  Filled 2016-06-11: qty 2

## 2016-06-11 MED ORDER — LACTATED RINGERS IV SOLN
INTRAVENOUS | Status: DC
  Administered 2016-06-11: 1000 mL via INTRAVENOUS

## 2016-06-11 MED ORDER — PHENYLEPHRINE HCL 2.5 % OP SOLN
1.0000 [drp] | OPHTHALMIC | Status: AC
  Administered 2016-06-11 (×3): 1 [drp] via OPHTHALMIC

## 2016-06-11 MED ORDER — PROVISC 10 MG/ML IO SOLN
INTRAOCULAR | Status: DC | PRN
  Administered 2016-06-11: 0.85 mL via INTRAOCULAR

## 2016-06-11 MED ORDER — LIDOCAINE HCL 3.5 % OP GEL
1.0000 "application " | Freq: Once | OPHTHALMIC | Status: AC
  Administered 2016-06-11: 1 via OPHTHALMIC

## 2016-06-11 MED ORDER — NEOMYCIN-POLYMYXIN-DEXAMETH 3.5-10000-0.1 OP SUSP
OPHTHALMIC | Status: DC | PRN
  Administered 2016-06-11: 2 [drp] via OPHTHALMIC

## 2016-06-11 SURGICAL SUPPLY — 12 items
CLOTH BEACON ORANGE TIMEOUT ST (SAFETY) ×1 IMPLANT
EYE SHIELD UNIVERSAL CLEAR (GAUZE/BANDAGES/DRESSINGS) ×1 IMPLANT
GLOVE BIOGEL PI IND STRL 6.5 (GLOVE) IMPLANT
GLOVE BIOGEL PI IND STRL 8 (GLOVE) IMPLANT
GLOVE BIOGEL PI INDICATOR 6.5 (GLOVE) ×1
GLOVE BIOGEL PI INDICATOR 8 (GLOVE) ×1
PAD ARMBOARD 7.5X6 YLW CONV (MISCELLANEOUS) ×1 IMPLANT
SIGHTPATH CAT PROC W REG LENS (Ophthalmic Related) ×2 IMPLANT
SYRINGE LUER LOK 1CC (MISCELLANEOUS) ×1 IMPLANT
TAPE SURG TRANSPORE 1 IN (GAUZE/BANDAGES/DRESSINGS) IMPLANT
TAPE SURGICAL TRANSPORE 1 IN (GAUZE/BANDAGES/DRESSINGS) ×1
WATER STERILE IRR 250ML POUR (IV SOLUTION) ×1 IMPLANT

## 2016-06-11 NOTE — Anesthesia Preprocedure Evaluation (Signed)
Anesthesia Evaluation  Patient identified by MRN, date of birth, ID band Patient awake    Reviewed: Allergy & Precautions, H&P , NPO status , Patient's Chart, lab work & pertinent test results  Airway Mallampati: II  TM Distance: >3 FB Neck ROM: full    Dental no notable dental hx. (+) Dental Advisory Given, Teeth Intact   Pulmonary COPD, former smoker,    Pulmonary exam normal breath sounds clear to auscultation       Cardiovascular Exercise Tolerance: Good hypertension, Pt. on medications negative cardio ROS Normal cardiovascular exam+ dysrhythmias  Rhythm:regular Rate:Normal  Bradycardia. syncope   Neuro/Psych Occipital neuralgia negative neurological ROS  negative psych ROS   GI/Hepatic negative GI ROS, Neg liver ROS,   Endo/Other  negative endocrine ROS  Renal/GU negative Renal ROS  negative genitourinary   Musculoskeletal   Abdominal   Peds  Hematology negative hematology ROS (+)   Anesthesia Other Findings   Reproductive/Obstetrics negative OB ROS                             Anesthesia Physical Anesthesia Plan  ASA: III  Anesthesia Plan: MAC   Post-op Pain Management:    Induction: Intravenous  Airway Management Planned: Nasal Cannula  Additional Equipment:   Intra-op Plan:   Post-operative Plan:   Informed Consent: I have reviewed the patients History and Physical, chart, labs and discussed the procedure including the risks, benefits and alternatives for the proposed anesthesia with the patient or authorized representative who has indicated his/her understanding and acceptance.     Plan Discussed with:   Anesthesia Plan Comments:         Anesthesia Quick Evaluation

## 2016-06-11 NOTE — H&P (Signed)
I have reviewed the H&P, the patient was re-examined, and I have identified no interval changes in medical condition and plan of care since the history and physical of record  

## 2016-06-11 NOTE — Op Note (Signed)
Date of Admission: 06/11/2016  Date of Surgery: 06/11/2016  Pre-Op Dx: Cataract Right  Eye  Post-Op Dx: Senile Combined Cataract  Right  Eye,  Dx Code [id number]  Surgeon: Tonny Branch, M.D.  Assistants: None  Anesthesia: Topical with MAC  Indications: Painless, progressive loss of vision with compromise of daily activities.  Surgery: Cataract Extraction with Intraocular lens Implant Right Eye  Discription: The patient had dilating drops and viscous lidocaine placed into the Right eye in the pre-op holding area. After transfer to the operating room, a time out was performed. The patient was then prepped and draped. Beginning with a 39 degree blade a paracentesis port was made at the surgeon's 2 o'clock position. The anterior chamber was then filled with 1% non-preserved lidocaine. This was followed by filling the anterior chamber with Provisc.  A 2.26mm keratome blade was used to make a clear corneal incision at the temporal limbus.  A bent cystatome needle was used to create a continuous tear capsulotomy. Hydrodissection was performed with balanced salt solution on a Fine canula. The lens nucleus was then removed using the phacoemulsification handpiece. Residual cortex was removed with the I&A handpiece. The anterior chamber and capsular bag were refilled with Provisc. A posterior chamber intraocular lens was placed into the capsular bag with it's injector. The implant was positioned with the Kuglan hook. The Provisc was then removed from the anterior chamber and capsular bag with the I&A handpiece. Stromal hydration of the main incision and paracentesis port was performed with BSS on a Fine canula. The wounds were tested for leak which was negative. The patient tolerated the procedure well. There were no operative complications. The patient was then transferred to the recovery room in stable condition.  Complications: None  Specimen: None  EBL: None  Prosthetic device: Abbott Technis, PCB00,  power 22.0, SN [id number].

## 2016-06-11 NOTE — Transfer of Care (Signed)
Immediate Anesthesia Transfer of Care Note  Patient: Courtney Nielsen  Procedure(s) Performed: Procedure(s) with comments: CATARACT EXTRACTION PHACO AND INTRAOCULAR LENS PLACEMENT RIGHT EYE CDE=6.15 (Right) - right  Patient Location: Short Stay  Anesthesia Type:MAC  Level of Consciousness: awake, alert , oriented and patient cooperative  Airway & Oxygen Therapy: Patient Spontanous Breathing  Post-op Assessment: Report given to RN, Post -op Vital signs reviewed and stable and Patient moving all extremities  Post vital signs: Reviewed and stable  Last Vitals:  Vitals:   06/11/16 0720 06/11/16 0725  BP: (!) 151/71 135/83  Resp: 15 (!) 21  Temp:      Last Pain: There were no vitals filed for this visit.    Patients Stated Pain Goal: 0 ([id number])  Complications: No apparent anesthesia complications

## 2016-06-11 NOTE — Anesthesia Postprocedure Evaluation (Signed)
Anesthesia Post Note  Patient: ANAGABRIELA YOU  Procedure(s) Performed: Procedure(s) (LRB): CATARACT EXTRACTION PHACO AND INTRAOCULAR LENS PLACEMENT RIGHT EYE CDE=6.15 (Right)  Patient location during evaluation: Short Stay Anesthesia Type: MAC Level of consciousness: awake and alert, oriented and patient cooperative Pain management: pain level controlled Vital Signs Assessment: post-procedure vital signs reviewed and stable Respiratory status: spontaneous breathing, nonlabored ventilation and respiratory function stable Cardiovascular status: blood pressure returned to baseline and stable Postop Assessment: no signs of nausea or vomiting Anesthetic complications: no    Last Vitals:  Vitals:   06/11/16 0720 06/11/16 0725  BP: (!) 151/71 135/83  Resp: 15 (!) 21  Temp:      Last Pain: There were no vitals filed for this visit.               Shamond Skelton J

## 2016-06-11 NOTE — Discharge Instructions (Signed)

## 2016-06-14 ENCOUNTER — Encounter (HOSPITAL_COMMUNITY): Payer: Self-pay | Admitting: Ophthalmology

## 2016-09-06 ENCOUNTER — Telehealth: Payer: Self-pay

## 2016-09-06 NOTE — Telephone Encounter (Signed)
Do I need to see her: LOV in 10/17? Thx

## 2016-09-06 NOTE — Telephone Encounter (Signed)
Medical cleaance for patient to have surgery from [facility]. Please advise

## 2016-09-07 NOTE — Telephone Encounter (Signed)
Patient has appt 03/15/17 with you. Medical clearance form sent to [facility] [phone]

## 2016-09-11 ENCOUNTER — Encounter: Payer: Self-pay | Admitting: Internal Medicine

## 2016-09-11 ENCOUNTER — Ambulatory Visit (INDEPENDENT_AMBULATORY_CARE_PROVIDER_SITE_OTHER): Payer: BLUE CROSS/BLUE SHIELD | Admitting: Internal Medicine

## 2016-09-11 ENCOUNTER — Other Ambulatory Visit (INDEPENDENT_AMBULATORY_CARE_PROVIDER_SITE_OTHER): Payer: BLUE CROSS/BLUE SHIELD

## 2016-09-11 ENCOUNTER — Ambulatory Visit (INDEPENDENT_AMBULATORY_CARE_PROVIDER_SITE_OTHER)
Admission: RE | Admit: 2016-09-11 | Discharge: 2016-09-11 | Disposition: A | Discharge status: Home / Self Care | Payer: BLUE CROSS/BLUE SHIELD | Source: Ambulatory Visit | Attending: Internal Medicine | Admitting: Internal Medicine

## 2016-09-11 ENCOUNTER — Telehealth: Payer: Self-pay

## 2016-09-11 ENCOUNTER — Other Ambulatory Visit: Payer: BLUE CROSS/BLUE SHIELD

## 2016-09-11 DIAGNOSIS — G8929 Other chronic pain: Secondary | ICD-10-CM | POA: Diagnosis not present

## 2016-09-11 DIAGNOSIS — M5441 Lumbago with sciatica, right side: Secondary | ICD-10-CM

## 2016-09-11 DIAGNOSIS — I10 Essential (primary) hypertension: Secondary | ICD-10-CM | POA: Diagnosis not present

## 2016-09-11 DIAGNOSIS — E538 Deficiency of other specified B group vitamins: Secondary | ICD-10-CM

## 2016-09-11 DIAGNOSIS — M47816 Spondylosis without myelopathy or radiculopathy, lumbar region: Secondary | ICD-10-CM | POA: Diagnosis not present

## 2016-09-11 LAB — URINALYSIS
Bilirubin Urine: NEGATIVE
HGB URINE DIPSTICK: NEGATIVE
Ketones, ur: NEGATIVE
Leukocytes, UA: NEGATIVE
NITRITE: NEGATIVE
SPECIFIC GRAVITY, URINE: 1.01 (ref 1.000–1.030)
Total Protein, Urine: NEGATIVE
UROBILINOGEN UA: 0.2 (ref 0.0–1.0)
Urine Glucose: NEGATIVE
pH: 6 (ref 5.0–8.0)

## 2016-09-11 MED ORDER — METHYLPREDNISOLONE ACETATE 80 MG/ML IJ SUSP
80.0000 mg | Freq: Once | INTRAMUSCULAR | Status: AC
  Administered 2016-09-11: 80 mg via INTRAMUSCULAR

## 2016-09-11 MED ORDER — HYDROCODONE-ACETAMINOPHEN 5-325 MG PO TABS: 1.0000 | ORAL_TABLET | Freq: Four times a day (QID) | ORAL | 0 refills | Status: DC | PRN

## 2016-09-11 NOTE — Assessment & Plan Note (Signed)
Losartan 

## 2016-09-11 NOTE — Patient Instructions (Signed)
Mid-Back Strain Rehab Ask your health care provider which exercises are safe for you. Do exercises exactly as told by your health care provider and adjust them as directed. It is normal to feel mild stretching, pulling, tightness, or discomfort as you do these exercises, but you should stop right away if you feel sudden pain or your pain gets worse. Do not begin these exercises until told by your health care provider. Stretching and range of motion exercises This exercise warms up your muscles and joints and improves the movement and flexibility of your back and shoulders. This exercise also help to relieve pain. Exercise A: Chest and spine stretch   1. Lie down on your back on a firm surface. 2. Roll a towel or a small blanket so it is about 4 inches (10 cm) in diameter. 3. Put the towel lengthwise under the middle of your back so it is under your spine, but not under your shoulder blades. 4. To increase the stretch, you may put your hands behind your head and let your elbows fall to your sides. 5. Hold for __________ seconds. Repeat exercise __________ times. Complete this exercise __________ times a day. Strengthening exercises These exercises build strength and endurance in your back and your shoulder blade muscles. Endurance is the ability to use your muscles for a long time, even after they get tired. Exercise B: Alternating arm and leg raises   1. Get on your hands and knees on a firm surface. If you are on a hard floor, you may want to use padding to cushion your knees, such as an exercise mat. 2. Line up your arms and legs. Your hands should be below your shoulders, and your knees should be below your hips. 3. Lift your left leg behind you. At the same time, raise your right arm and straighten it in front of you.  Do not lift your leg higher than your hip.  Do not lift your arm higher than your shoulder.  Keep your abdominal and back muscles tight.  Keep your hips facing the  ground.  Do not arch your back.  Keep your balance carefully, and do not hold your breath. 4. Hold for __________ seconds. 5. Slowly return to the starting position and repeat with your right leg and your left arm. Repeat __________ times. Complete this exercise __________ times a day. Exercise C: Straight arm rows (  shoulder extension) 1. Stand with your feet shoulder width apart. 2. Secure an exercise band to a stable object in front of you so the band is at or above shoulder height. 3. Hold one end of the exercise band in each hand. 4. Straighten your elbows and lift your hands up to shoulder height. 5. Step back, away from the secured end of the exercise band, until the band stretches. 6. Squeeze your shoulder blades together and pull your hands down to the sides of your thighs. Stop when your hands are straight down by your sides. Do not let your hands go behind your body. 7. Hold for __________ seconds. 8. Slowly return to the starting position. Repeat __________ times. Complete this exercise __________ times a day. Exercise D: Shoulder external rotation, prone 1. Lie on your abdomen on a firm bed so your left / right forearm hangs over the edge of the bed and your upper arm is on the bed, straight out from your body.  Your elbow should be bent.  Your palm should be facing your feet. 2. If instructed, hold  a __________ weight in your hand. 3. Squeeze your shoulder blade toward the middle of your back. Do not let your shoulder lift toward your ear. 4. Keep your elbow bent in an "L" shape (90 degrees) while you slowly move your forearm up toward the ceiling. Move your forearm up to the height of the bed, toward your head.  Your upper arm should not move.  At the top of the movement, your palm should face the floor. 5. Hold for __________ seconds. 6. Slowly return to the starting position and relax your muscles. Repeat __________ times. Complete this exercise __________ times a  day. Exercise E: Scapular retraction and external rotation, rowing  1. Sit in a stable chair without armrests, or stand. 2. Secure an exercise band to a stable object in front of you so it is at shoulder height. 3. Hold one end of the exercise band in each hand. 4. Bring your arms out straight in front of you. 5. Step back, away from the secured end of the exercise band, until the band stretches. 6. Pull the band backward. As you do this, bend your elbows and squeeze your shoulder blades together, but avoid letting the rest of your body move. Do not let your shoulders lift up toward your ears. 7. Stop when your elbows are at your sides or slightly behind your body. 8. Hold for __________ seconds. 9. Slowly straighten your arms to return to the starting position. Repeat __________ times. Complete this exercise __________ times a day. Posture and body mechanics  Body mechanics refers to the movements and positions of your body while you do your daily activities. Posture is part of body mechanics. Good posture and healthy body mechanics can help to relieve stress in your body's tissues and joints. Good posture means that your spine is in its natural S-curve position (your spine is neutral), your shoulders are pulled back slightly, and your head is not tipped forward. The following are general guidelines for applying improved posture and body mechanics to your everyday activities. Standing   When standing, keep your spine neutral and your feet about hip-width apart. Keep a slight bend in your knees. Your ears, shoulders, and hips should line up.  When you do a task in which you lean forward while standing in one place for a long time, place one foot up on a stable object that is 2-4 inches (5-10 cm) high, such as a footstool. This helps keep your spine neutral. Sitting   When sitting, keep your spine neutral and keep your feet flat on the floor. Use a footrest, if necessary, and keep your thighs  parallel to the floor. Avoid rounding your shoulders, and avoid tilting your head forward.  When working at a desk or a computer, keep your desk at a height where your hands are slightly lower than your elbows. Slide your chair under your desk so you are close enough to maintain good posture.  When working at a computer, place your monitor at a height where you are looking straight ahead and you do not have to tilt your head forward or downward to look at the screen. Resting  When lying down and resting, avoid positions that are most painful for you.  If you have pain with activities such as sitting, bending, stooping, or squatting (flexion-based activities), lie in a position in which your body does not bend very much. For example, avoid curling up on your side with your arms and knees near your chest (fetal  position).  If you have pain with activities such as standing for a long time or reaching with your arms (extension-based activities), lie with your spine in a neutral position and bend your knees slightly. Try the following positions:  Lying on your side with a pillow between your knees.  Lying on your back with a pillow under your knees. Lifting   When lifting objects, keep your feet at least shoulder-width apart and tighten your abdominal muscles.  Bend your knees and hips and keep your spine neutral. It is important to lift using the strength of your legs, not your back. Do not lock your knees straight out.  Always ask for help to lift heavy or awkward objects. This information is not intended to replace advice given to you by your health care provider. Make sure you discuss any questions you have with your health care provider. Document Released: 06/18/2005 Document Revised: 02/23/2016 Document Reviewed: 03/30/2015 Elsevier Interactive Patient Education  2017 [employer].

## 2016-09-11 NOTE — Progress Notes (Signed)
Pre-visit discussion using our clinic review tool. No additional management support is needed unless otherwise documented below in the visit note.  

## 2016-09-11 NOTE — Progress Notes (Signed)
Subjective:  Patient ID: Courtney Nielsen, female    DOB: 08-04-1955  Age: 61 y.o. MRN: [id number]  CC: Back Pain (unable to straighten up, almost fell injuring right forearm, and right knee)   HPI Courtney Nielsen presents for severe LBP in the middle of the back pain since Friday - no injury 10/10 and worse w/ROM. Can't stand strait Pt tried AZO and Tylenol - no help  Outpatient Medications Prior to Visit  Medication Sig Dispense Refill  . acetaminophen (TYLENOL) 500 MG tablet Take 1,000 mg by mouth every 6 (six) hours as needed for moderate pain.    Marland Kitchen ALPRAZolam (XANAX) 0.5 MG tablet Take 1 tablet (0.5 mg total) by mouth at bedtime as needed for anxiety. (Patient taking differently: Take 0.5 mg by mouth at bedtime. ) 90 tablet 3  . aspirin EC 81 MG tablet Take 81 mg by mouth at bedtime.     . celecoxib (CELEBREX) 200 MG capsule TAKE ONE CAPSULE BY MOUTH TWICE DAILY. 60 capsule 5  . cholecalciferol (VITAMIN D) 1000 units tablet Take 1,000 Units by mouth at bedtime.    Marland Kitchen estradiol (ESTRACE) 0.5 MG tablet Take 0.5 mg by mouth at bedtime.     Marland Kitchen loratadine (CLARITIN) 10 MG tablet Take 10 mg by mouth at bedtime.    Marland Kitchen losartan (COZAAR) 100 MG tablet Take 1 tablet (100 mg total) by mouth every evening. (Patient taking differently: Take 100 mg by mouth at bedtime. ) 90 tablet 3  . medroxyPROGESTERone (PROVERA) 2.5 MG tablet Take 2.5 mg by mouth at bedtime.     . Menthol, Topical Analgesic, (BLUE-EMU MAXIMUM STRENGTH EX) Apply 1 application topically 4 (four) times daily as needed (for pain.).     Marland Kitchen Omega-3 Fatty Acids (FISH OIL) 1200 MG CAPS Take 1,200 mg by mouth at bedtime.    . vitamin B-12 (CYANOCOBALAMIN) 500 MCG tablet Take 500 mcg by mouth every other day.     No facility-administered medications prior to visit.     ROS Review of Systems  Constitutional: Negative for activity change, appetite change, chills, fatigue and unexpected weight change.  HENT: Negative for congestion,  mouth sores and sinus pressure.   Eyes: Negative for visual disturbance.  Respiratory: Negative for cough and chest tightness.   Gastrointestinal: Negative for abdominal pain and nausea.  Genitourinary: Negative for difficulty urinating, frequency and vaginal pain.  Musculoskeletal: Positive for back pain and gait problem.  Skin: Negative for pallor and rash.  Neurological: Negative for dizziness, tremors, weakness, numbness and headaches.  Psychiatric/Behavioral: Negative for confusion and sleep disturbance.    Objective:  BP 126/82   Pulse 83   Temp 97.7 F (36.5 C) (Oral)   Resp 16   Ht 5\' 10"  (1.778 m)   Wt 233 lb (105.7 kg)   SpO2 97%   BMI 33.43 kg/m   BP Readings from Last 3 Encounters:  09/11/16 126/82  06/11/16 (!) 147/75  06/01/16 (!) 169/89    Wt Readings from Last 3 Encounters:  09/11/16 233 lb (105.7 kg)  06/01/16 230 lb (104.3 kg)  04/11/16 230 lb (104.3 kg)    Physical Exam  Constitutional: She appears well-developed. No distress.  HENT:  Head: Normocephalic.  Right Ear: External ear normal.  Left Ear: External ear normal.  Nose: Nose normal.  Mouth/Throat: Oropharynx is clear and moist.  Eyes: Conjunctivae are normal. Pupils are equal, round, and reactive to light. Right eye exhibits no discharge. Left eye exhibits no discharge.  Neck: Normal range of motion. Neck supple. No JVD present. No tracheal deviation present. No thyromegaly present.  Cardiovascular: Normal rate, regular rhythm and normal heart sounds.   Pulmonary/Chest: No stridor. No respiratory distress. She has no wheezes.  Abdominal: Soft. Bowel sounds are normal. She exhibits no distension and no mass. There is no tenderness. There is no rebound and no guarding.  Musculoskeletal: She exhibits tenderness. She exhibits no edema.  Lymphadenopathy:    She has no cervical adenopathy.  Neurological: She displays normal reflexes. No cranial nerve deficit. She exhibits normal muscle tone.  Coordination abnormal.  Skin: No erythema.  Psychiatric: She has a normal mood and affect. Her behavior is normal. Judgment and thought content normal.  sacrum - tender In a w/c str leg (-) B  Lab Results  Component Value Date   WBC 7.6 03/12/2016   HGB 14.6 03/12/2016   HCT 43.2 03/12/2016   PLT 251.0 03/12/2016   GLUCOSE 96 03/12/2016   CHOL 205 (H) 03/12/2016   TRIG 145.0 03/12/2016   HDL 44.40 03/12/2016   LDLDIRECT 164.6 01/05/2008   LDLCALC 132 (H) 03/12/2016   ALT 18 03/12/2016   AST 15 03/12/2016   NA 141 03/12/2016   K 3.8 03/12/2016   CL 106 03/12/2016   CREATININE 1.02 03/12/2016   BUN 13 03/12/2016   CO2 27 03/12/2016   TSH 0.70 03/12/2016   HGBA1C 5.6 03/02/2014    No results found.  Assessment & Plan:   There are no diagnoses linked to this encounter. I am having Ms. Mcmannis maintain her estradiol, medroxyPROGESTERone, aspirin EC, loratadine, acetaminophen, (Menthol, Topical Analgesic, (BLUE-EMU MAXIMUM STRENGTH EX)), ALPRAZolam, losartan, celecoxib, Fish Oil, vitamin B-12, and cholecalciferol.  No orders of the defined types were placed in this encounter.    Follow-up: No Follow-up on file.  Walker Kehr, MD

## 2016-09-11 NOTE — Telephone Encounter (Signed)
Patient request Placard

## 2016-09-11 NOTE — Assessment & Plan Note (Signed)
?  [facility] vs other X ray MRI if not better Norco prn Depo-medrol IM

## 2016-09-11 NOTE — Telephone Encounter (Signed)
Patient is requesting Handicap placard Please advise

## 2016-09-11 NOTE — Assessment & Plan Note (Signed)
On B12 

## 2016-09-12 ENCOUNTER — Telehealth: Payer: Self-pay | Admitting: Internal Medicine

## 2016-09-12 DIAGNOSIS — M545 Low back pain, unspecified: Secondary | ICD-10-CM

## 2016-09-12 NOTE — Telephone Encounter (Signed)
Will order MRI Placcard form was filled out 2 d ago Thx

## 2016-09-12 NOTE — Telephone Encounter (Signed)
Patient states depo IM injection did not help---she is going to try norco, but wants dr plotnikov to order MRI---routing to dr Alain Marion, can you please order MRI and also patient is wanting a handicap placcard for her car----please advise, thanks

## 2016-09-12 NOTE — Telephone Encounter (Signed)
Please follow up in regard to x ray.  Gave patient MD response on X ray and does not know what plan she discussed with Dr. Camila Li about treating arthritis.

## 2016-09-12 NOTE — Telephone Encounter (Signed)
Done

## 2016-09-13 NOTE — Telephone Encounter (Signed)
Patient advised of dr plotnikovs note----placcard form has been placed up front for patient's husband to pick up per patient request

## 2016-09-15 ENCOUNTER — Ambulatory Visit
Admission: RE | Admit: 2016-09-15 | Discharge: 2016-09-15 | Disposition: A | Discharge status: Home / Self Care | Payer: BLUE CROSS/BLUE SHIELD | Source: Ambulatory Visit | Attending: Internal Medicine | Admitting: Internal Medicine

## 2016-09-15 DIAGNOSIS — M545 Low back pain, unspecified: Secondary | ICD-10-CM

## 2016-09-15 DIAGNOSIS — M48061 Spinal stenosis, lumbar region without neurogenic claudication: Secondary | ICD-10-CM | POA: Diagnosis not present

## 2016-09-17 ENCOUNTER — Telehealth: Payer: Self-pay | Admitting: Internal Medicine

## 2016-09-17 NOTE — Telephone Encounter (Signed)
Pt called request MRI result. Please give her a call back.

## 2016-09-17 NOTE — Telephone Encounter (Signed)
Routing to dr plotnikov, please advise, I will call patient back, thanks 

## 2016-09-18 ENCOUNTER — Telehealth: Payer: Self-pay | Admitting: Internal Medicine

## 2016-09-18 NOTE — Telephone Encounter (Signed)
I have sent a message via MyChart thx

## 2016-09-18 NOTE — Telephone Encounter (Signed)
Routing to dr plotnikov, please advise, I will call patient back, thanks 

## 2016-09-18 NOTE — Telephone Encounter (Signed)
Patient called back about MRI.  Is requesting call back in regard.  Would also like to get referral started for injections with Dr. Vertell Limber.

## 2016-09-18 NOTE — Telephone Encounter (Signed)
Pt called back wanting info on her MRI. I did let her know Dr. Parks Neptune notes on the MRI. She is wanting to know why her back all of sudden hurts so bad that she can't stand up straight. She did not mention if she wanted a referral to Dr. Vertell Limber. She did however stated she seen him before in the past.   She is wanting a call back today, please.

## 2016-09-20 DIAGNOSIS — M545 Low back pain: Secondary | ICD-10-CM | POA: Diagnosis not present

## 2016-09-20 DIAGNOSIS — M5126 Other intervertebral disc displacement, lumbar region: Secondary | ICD-10-CM | POA: Diagnosis not present

## 2016-09-20 DIAGNOSIS — M5416 Radiculopathy, lumbar region: Secondary | ICD-10-CM | POA: Diagnosis not present

## 2016-09-20 DIAGNOSIS — M47816 Spondylosis without myelopathy or radiculopathy, lumbar region: Secondary | ICD-10-CM | POA: Diagnosis not present

## 2016-09-20 DIAGNOSIS — M4310 Spondylolisthesis, site unspecified: Secondary | ICD-10-CM | POA: Insufficient documentation

## 2016-09-20 DIAGNOSIS — I10 Essential (primary) hypertension: Secondary | ICD-10-CM | POA: Diagnosis not present

## 2016-10-01 DIAGNOSIS — M47816 Spondylosis without myelopathy or radiculopathy, lumbar region: Secondary | ICD-10-CM | POA: Diagnosis not present

## 2016-10-03 ENCOUNTER — Other Ambulatory Visit: Payer: Self-pay | Admitting: Internal Medicine

## 2016-10-08 ENCOUNTER — Encounter (HOSPITAL_COMMUNITY): Payer: Self-pay | Admitting: Physical Therapy

## 2016-10-08 ENCOUNTER — Ambulatory Visit (HOSPITAL_COMMUNITY): Payer: BLUE CROSS/BLUE SHIELD | Attending: Neurosurgery | Admitting: Physical Therapy

## 2016-10-08 DIAGNOSIS — R29898 Other symptoms and signs involving the musculoskeletal system: Secondary | ICD-10-CM

## 2016-10-08 DIAGNOSIS — M545 Low back pain: Secondary | ICD-10-CM

## 2016-10-08 DIAGNOSIS — R2689 Other abnormalities of gait and mobility: Secondary | ICD-10-CM | POA: Diagnosis not present

## 2016-10-08 NOTE — Telephone Encounter (Signed)
Patient has called in regard.  Please advise.

## 2016-10-09 ENCOUNTER — Other Ambulatory Visit: Payer: Self-pay | Admitting: Internal Medicine

## 2016-10-09 NOTE — Therapy (Signed)
[place], [place], [address] Phone: [phone]   Fax:  [phone]  Physical Therapy Evaluation  Patient Details  Name: Courtney Nielsen MRN: [id number] Date of Birth: [birth year] Referring Provider: Erline Levine, MD  Encounter Date: 10/08/2016      PT End of Session - 10/09/16 0617    Visit Number 1   Number of Visits 5   Date for PT Re-Evaluation 11/12/16   Authorization Type BCBS   Authorization Time Period 10/08/16 to 11/12/16   PT Start Time 1735   PT Stop Time 1825   PT Time Calculation (min) 50 min   Activity Tolerance Patient tolerated treatment well;No increased pain   Behavior During Therapy WFL for tasks assessed/performed      Past Medical History:  Diagnosis Date  . Allergic rhinitis   . Arthritis   . Chest pain   . Colonic polyp   . COPD (chronic obstructive pulmonary disease) ([place])    patient denies at preop on 10/12/15   . Dysrhythmia    hx of every third beat is " off" per patient   . GERD (gastroesophageal reflux disease)    pt denies  . Hypertension   . Pneumonia    hx of 2014   . Smoker   . Vitamin B12 deficiency     Past Surgical History:  Procedure Laterality Date  . CARDIAC CATHETERIZATION     cath negative per patient   . CATARACT EXTRACTION W/PHACO Right 06/11/2016   Procedure: CATARACT EXTRACTION PHACO AND INTRAOCULAR LENS PLACEMENT RIGHT EYE CDE=6.15;  Surgeon: Tonny Branch, MD;  Location: [facility];  Service: Ophthalmology;  Laterality: Right;  right  . COLONOSCOPY    . KNEE ARTHROSCOPY Right 10/19/2015   Procedure: RIGHT KNEE SCOPE WITH MENISCAL DEBRIDEMENT,LATERAL SYNOVECTOMY;  Surgeon: Gaynelle Arabian, MD;  Location: [facility];  Service: Orthopedics;  Laterality: Right;    There were no vitals filed for this visit.       Subjective Assessment - 10/08/16 1740    Subjective Pt reports that one day she almost fell. She caught herself on a pole and noticed low back pain  about 1 weeks later. She saw her PCP who found arthritis in her low back. She was then referred to Dr. Vertell Limber. While at the neurosurgery office, they gave her 6 injections. This all took place around the end of March. She was in a wheelchair at the time she saw the neurosurgeon. Since then, her back has been feeling alot better. She has been performing alot a stretching at home as well.   Pertinent History Rt knee scope 2017, GERD, HTN, COPD   Limitations Walking;Standing;Other (comment)  being up on her feet for a long time.    How long can you stand comfortably? ~30 min    How long can you walk comfortably? ~30 min   Patient Stated Goals "stretch out my back"   Currently in Pain? No/denies   Pain Location Back   Pain Orientation Right;Left;Lower   Pain Descriptors / Indicators Aching;Tightness;Dull   Pain Type Chronic pain   Pain Radiating Towards none    Pain Onset More than a month ago   Pain Frequency Constant   Aggravating Factors  being up on her feet too long   Pain Relieving Factors stretching, injections and muscle relaxers    Effect of Pain on Daily Activities increased pain during the day at work and with other houshold tasks  [facility] PT Assessment - 10/09/16 0001      Assessment   Medical Diagnosis Lumbar pain    Referring Provider Erline Levine, MD   Onset Date/Surgical Date --  Mid March    Next MD Visit 10/25/16   Prior Therapy none      Precautions   Precautions None     Balance Screen   Has the patient fallen in the past 6 months No   Has the patient had a decrease in activity level because of a fear of falling?  No   Is the patient reluctant to leave their home because of a fear of falling?  No     Home Environment   Living Environment Assisted living     Prior Function   Level of Independence Independent   Vocation Full time employment   Vocation Requirements sitting at a desk      Cognition   Overall Cognitive Status Within Functional  Limits for tasks assessed     Observation/Other Assessments   Focus on Therapeutic Outcomes (FOTO)  60% limited      Sensation   Light Touch Appears Intact     AROM   Overall AROM Comments Lumbar AROM WNL and pain free     Strength   Right Hip Flexion 5/5   Right Hip Extension 5/5   Right Hip ABduction 4+/5   Left Hip Flexion 5/5   Left Hip Extension 5/5   Left Hip ABduction 4+/5   Right Knee Flexion 4+/5   Right Knee Extension 5/5   Left Knee Flexion 4+/5   Left Knee Extension 5/5   Right Ankle Dorsiflexion 5/5   Left Ankle Dorsiflexion 5/5     Flexibility   Soft Tissue Assessment /Muscle Length yes   Hamstrings WNL    Quadriceps 25% limited    Piriformis 75% limited BLE      Palpation   Palpation comment Pt reporting no tenderness with palpation along her lumbar paraspinals, QL, piriformis/glute region      Transfers   Five time sit to stand comments  12.7 sec, UE on thighs      Ambulation/Gait   Gait Comments (+) trendelenburg, pt walking on lateral aspect of her feet, Lt>Rt                   [facility] Adult PT Treatment/Exercise - 10/09/16 0001      Exercises   Exercises Lumbar     Lumbar Exercises: Stretches   Hip Flexor Stretch 1 rep;30 seconds   Hip Flexor Stretch Limitations supine, HEP demo     Piriformis Stretch 1 rep;30 seconds   Piriformis Stretch Limitations seated, HEP demo      Lumbar Exercises: Seated   Other Seated Lumbar Exercises seated trunk rotation stretch x10 sec Lt and Rt for HEP demo                 PT Education - 10/09/16 [phone]    Education provided Yes   Education Details eval findings/POC; use of lumbar roll and log roll during the day to decrease strains on her back; importace of improved hip flexibility in improving her back pain; HEP initiated    Person(s) Educated Patient   Methods Explanation;Demonstration;Verbal cues;Handout   Comprehension Verbalized understanding;Returned demonstration          PT  Short Term Goals - 10/09/16 0622      PT SHORT TERM GOAL #1   Title Pt will demo understanding and consistency with  her HEP to improve postural awareness and activity tolerance.    Time 1   Period Weeks   Status New           PT Long Term Goals - 10/09/16 4270      PT LONG TERM GOAL #1   Title Pt will demo improved body mechanics, evident by her ability to demonstrate proper set up and verbalize use of lumbar roll at home and work to increase sitting tolerance.    Time 5   Period Weeks   Status New     PT LONG TERM GOAL #2   Title Pt will demo consistent use of proper log roll technique throughout her session, without cues from therapist, to decrease low back strain during transitions in and out of bed.   Time 5   Period Weeks   Status New     PT LONG TERM GOAL #3   Title Pt will complete 5x sit to stand in less than 10 sec without UE support, which will demonstrate an improvement in functional strength and power.    Time 5   Period Weeks   Status New     PT LONG TERM GOAL #4   Title Pt will demo improved LE strength to 5/5 MMT which will increase her safety with daily functional activity.    Time 5   Period Weeks   Status New               Plan - 10/09/16 0618    Clinical Impression Statement Pt is a 61yo F referred to [facility] for evaluation and management of low back pain ongoing for several months now. She denies any change in bowel/bladder function and reports no radicular symptoms or numbness and tingling at this time. She did receive several injections a couple of weeks ago and her pain is much improved upon arrival to the clinic. She demonstrates overall good BLE strenght, with weakness of the hamstrings and hip abductors specifically. She also presents with limitations in hip flexibility and poor body mechanics which are contributing to her low back pain. She would benefit from skilled PT to establish an advanced HEP for her to maintain and improve her activity  tolerance/pain at home and at her job.    Rehab Potential Good   PT Frequency 1x / week   PT Duration Other (comment)  5 weeks    PT Treatment/Interventions ADLs/Self Care Home Management;Cryotherapy;Moist Heat;Gait training;Functional mobility training;Therapeutic activities;Therapeutic exercise;Neuromuscular re-education;Balance training;Patient/family education;Passive range of motion;Dry needling   PT Next Visit Plan hip flexibility (hip flexor and piriformis); hip abductor strengthening (side stepping, side stepping over hurdle); trunk stabilization and movement    PT Home Exercise Plan seated piriformis stretch, supine hip flexor stretch, seated trunk rotation stretch, lumbar roll, log roll    Consulted and Agree with Plan of Care Patient      Patient will benefit from skilled therapeutic intervention in order to improve the following deficits and impairments:  Abnormal gait, Decreased activity tolerance, Decreased endurance, Decreased mobility, Difficulty walking, Impaired flexibility, Postural dysfunction, Pain, Improper body mechanics, Hypomobility, Decreased strength, Decreased range of motion  Visit Diagnosis: Low back pain, unspecified back pain laterality, unspecified chronicity, with sciatica presence unspecified  Other abnormalities of gait and mobility  Other symptoms and signs involving the musculoskeletal system     Problem List Patient Active Problem List   Diagnosis Date Noted  . LLQ abdominal pain 03/15/2016  . Lateral meniscal tear 10/19/2015  . Left hip  pain 07/22/2013  . Occipital neuralgia 06/04/2013  . Leg pain 02/22/2012  . Gout 02/22/2012  . Weight gain 01/11/2012  . Edema leg 06/07/2011  . Pruritus 04/23/2011  . Well adult exam 04/23/2011  . Pneumonia, organism unspecified(486) 04/21/2011  . Cough 04/19/2011  . Paresthesia 09/26/2010  . NECK PAIN 08/22/2010  . BACK PAIN, THORACIC REGION 08/22/2010  . Low back pain 08/22/2010  . DIZZINESS  08/22/2010  . CERUMEN IMPACTION 07/21/2010  . CRAMP OF LIMB 03/16/2010  . BRONCHITIS, ACUTE 07/22/2009  . COPD 07/22/2009  . MEDIASTINAL LYMPHADENOPATHY 08/16/2008  . Nonspecific (abnormal) findings on radiological and other examination of body structure 08/16/2008  . CT, CHEST, ABNORMAL 08/16/2008  . CHEST PAIN 06/24/2008  . BRADYCARDIA 06/23/2008  . GERD 06/23/2008  . SYNCOPE 06/23/2008  . B12 deficiency 04/09/2008  . Pain in Soft Tissues of Limb 01/08/2008  . RASH AND OTHER NONSPECIFIC SKIN ERUPTION 01/08/2008  . SINUSITIS, ACUTE 10/31/2007  . HIP PAIN 10/31/2007  . TOBACCO USE DISORDER/SMOKER-SMOKING CESSATION DISCUSSED 04/21/2007  . Essential hypertension 04/21/2007  . MENOPAUSAL SYNDROME 04/21/2007  . ALLERGIC RHINITIS 04/19/2007  . COLONIC POLYPS, HX OF 04/19/2007    6:34 AM,10/09/16 Elly Modena PT, DPT [facility] [address] [place], [place], [address] Phone: [phone]   Fax:  [phone]  Name: LISSET KETCHEM MRN: [id number] Date of Birth: [birth year]

## 2016-10-10 ENCOUNTER — Ambulatory Visit: Payer: BLUE CROSS/BLUE SHIELD | Admitting: Internal Medicine

## 2016-10-10 NOTE — Telephone Encounter (Signed)
Please resend Xanax to [place].

## 2016-10-10 NOTE — Telephone Encounter (Signed)
Routing to dr plotnikov--please advise   thanks

## 2016-10-11 NOTE — Telephone Encounter (Signed)
rx okayed on 10/08/2016.   Called into [facility]. Pt informed of same.

## 2016-10-11 NOTE — Telephone Encounter (Signed)
Pt called and states that the prescription for ALPRAZolam (XANAX) 0.5 MG tablet that was sent to [facility] has not been received. Can this please be sent as soon as possible?

## 2016-10-15 ENCOUNTER — Encounter: Payer: Self-pay | Admitting: Gastroenterology

## 2016-10-17 ENCOUNTER — Ambulatory Visit (HOSPITAL_COMMUNITY): Payer: BLUE CROSS/BLUE SHIELD

## 2016-10-17 DIAGNOSIS — R2689 Other abnormalities of gait and mobility: Secondary | ICD-10-CM | POA: Diagnosis not present

## 2016-10-17 DIAGNOSIS — R29898 Other symptoms and signs involving the musculoskeletal system: Secondary | ICD-10-CM | POA: Diagnosis not present

## 2016-10-17 DIAGNOSIS — M545 Low back pain: Secondary | ICD-10-CM

## 2016-10-17 NOTE — Therapy (Signed)
[place], [place], [address] Phone: [phone]   Fax:  [phone]  Physical Therapy Treatment  Patient Details  Name: Courtney Nielsen MRN: [id number] Date of Birth: [birth year] Referring Provider: Erline Levine, MD  Encounter Date: 10/17/2016      PT End of Session - 10/17/16 1740    Visit Number 2   Number of Visits 5   Date for PT Re-Evaluation 11/12/16   Authorization Type BCBS   Authorization Time Period 10/08/16 to 11/12/16   PT Start Time 1735   PT Stop Time 1815   PT Time Calculation (min) 40 min   Activity Tolerance Patient tolerated treatment well;No increased pain   Behavior During Therapy WFL for tasks assessed/performed      Past Medical History:  Diagnosis Date  . Allergic rhinitis   . Arthritis   . Chest pain   . Colonic polyp   . COPD (chronic obstructive pulmonary disease) ([place])    patient denies at preop on 10/12/15   . Dysrhythmia    hx of every third beat is " off" per patient   . GERD (gastroesophageal reflux disease)    pt denies  . Hypertension   . Pneumonia    hx of 2014   . Smoker   . Vitamin B12 deficiency     Past Surgical History:  Procedure Laterality Date  . CARDIAC CATHETERIZATION     cath negative per patient   . CATARACT EXTRACTION W/PHACO Right 06/11/2016   Procedure: CATARACT EXTRACTION PHACO AND INTRAOCULAR LENS PLACEMENT RIGHT EYE CDE=6.15;  Surgeon: Tonny Branch, MD;  Location: [facility];  Service: Ophthalmology;  Laterality: Right;  right  . COLONOSCOPY    . KNEE ARTHROSCOPY Right 10/19/2015   Procedure: RIGHT KNEE SCOPE WITH MENISCAL DEBRIDEMENT,LATERAL SYNOVECTOMY;  Surgeon: Gaynelle Arabian, MD;  Location: [facility];  Service: Orthopedics;  Laterality: Right;    There were no vitals filed for this visit.      Subjective Assessment - 10/17/16 1735    Subjective Pt reports she is feeling good today, no reports of pain in lower back.  Reports compliance wiht HEP  daily without question.  Reports increased pain with hip flexion stretch to Lt hip due to bone spur.     Pertinent History Rt knee scope 2017, GERD, HTN, COPD   Patient Stated Goals "stretch out my back"   Currently in Pain? No/denies                         [facility] Adult PT Treatment/Exercise - 10/17/16 0001      Bed Mobility   Bed Mobility Sit to Sidelying Left   Sit to Sidelying Left 5: Supervision   Sit to Sidelying Left Details (indicate cue type and reason) 5 reps to assure compliance and proper form     Lumbar Exercises: Stretches   Lower Trunk Rotation Limitations 10x 10" holds   Hip Flexor Stretch 3 reps;30 seconds   Hip Flexor Stretch Limitations standing hip flexor stretch 2nd step   Piriformis Stretch 2 reps;30 seconds   Piriformis Stretch Limitations supine with towel     Lumbar Exercises: Standing   Other Standing Lumbar Exercises sidestep with RTB 2RT   Other Standing Lumbar Exercises side step over 2 6in and 2 12in hurdles 2RT     Lumbar Exercises: Seated   Other Seated Lumbar Exercises Reports of completeing towel roll daily  PT Education - 10/17/16 1830    Education provided Yes   Education Details Reviewed goals, assured compliancei wht HEP and modified stretches for Lt hip and Rt knee pain, copy of eval given to pt.  Reviewed proper bed mobiltiy and using towel roll in seated position.     Person(s) Educated Patient   Methods Explanation;Demonstration;Verbal cues;Handout   Comprehension Verbalized understanding;Returned demonstration;Need further instruction          PT Short Term Goals - 10/09/16 3295      PT SHORT TERM GOAL #1   Title Pt will demo understanding and consistency with her HEP to improve postural awareness and activity tolerance.    Time 1   Period Weeks   Status New           PT Long Term Goals - 10/09/16 1884      PT LONG TERM GOAL #1   Title Pt will demo improved body mechanics, evident  by her ability to demonstrate proper set up and verbalize use of lumbar roll at home and work to increase sitting tolerance.    Time 5   Period Weeks   Status New     PT LONG TERM GOAL #2   Title Pt will demo consistent use of proper log roll technique throughout her session, without cues from therapist, to decrease low back strain during transitions in and out of bed.   Time 5   Period Weeks   Status New     PT LONG TERM GOAL #3   Title Pt will complete 5x sit to stand in less than 10 sec without UE support, which will demonstrate an improvement in functional strength and power.    Time 5   Period Weeks   Status New     PT LONG TERM GOAL #4   Title Pt will demo improved LE strength to 5/5 MMT which will increase her safety with daily functional activity.    Time 5   Period Weeks   Status New               Plan - 10/17/16 1746    Clinical Impression Statement Reviewed goals, asssured compliance and revised/modified form wtih improve tolerance with stretches, and copy of eval given to pt..  Reviewed mechanics with bed mobilty with multiple reps to assure compliance and understanding.  Pt reports compliance with towel roll during sitting activities.  Therex focus on improving gluteal strengthening for balance and gait mechanics and improving LE mobility with stretches.  Pt able to complete all exercises without reoprts of lumbar or Rt knee pain.     Rehab Potential Good   PT Frequency 1x / week   PT Duration --  5 weeks   PT Treatment/Interventions ADLs/Self Care Home Management;Cryotherapy;Moist Heat;Gait training;Functional mobility training;Therapeutic activities;Therapeutic exercise;Neuromuscular re-education;Balance training;Patient/family education;Passive range of motion;Dry needling   PT Next Visit Plan hip flexibility (hip flexor and piriformis); hip abductor strengthening (side stepping, side stepping over hurdle); trunk stabilization and movement    PT Home Exercise  Plan seated piriformis stretch, supine hip flexor stretch, seated trunk rotation stretch, lumbar roll, log roll; 04/09 modified stretches for comfort standing hip flexor stretch and piriformis figure 4 in supine      Patient will benefit from skilled therapeutic intervention in order to improve the following deficits and impairments:  Abnormal gait, Decreased activity tolerance, Decreased endurance, Decreased mobility, Difficulty walking, Impaired flexibility, Postural dysfunction, Pain, Improper body mechanics, Hypomobility, Decreased strength, Decreased range of motion  Visit Diagnosis: Low back pain, unspecified back pain laterality, unspecified chronicity, with sciatica presence unspecified  Other abnormalities of gait and mobility  Other symptoms and signs involving the musculoskeletal system     Problem List Patient Active Problem List   Diagnosis Date Noted  . LLQ abdominal pain 03/15/2016  . Lateral meniscal tear 10/19/2015  . Left hip pain 07/22/2013  . Occipital neuralgia 06/04/2013  . Leg pain 02/22/2012  . Gout 02/22/2012  . Weight gain 01/11/2012  . Edema leg 06/07/2011  . Pruritus 04/23/2011  . Well adult exam 04/23/2011  . Pneumonia, organism unspecified(486) 04/21/2011  . Cough 04/19/2011  . Paresthesia 09/26/2010  . NECK PAIN 08/22/2010  . BACK PAIN, THORACIC REGION 08/22/2010  . Low back pain 08/22/2010  . DIZZINESS 08/22/2010  . CERUMEN IMPACTION 07/21/2010  . CRAMP OF LIMB 03/16/2010  . BRONCHITIS, ACUTE 07/22/2009  . COPD 07/22/2009  . MEDIASTINAL LYMPHADENOPATHY 08/16/2008  . Nonspecific (abnormal) findings on radiological and other examination of body structure 08/16/2008  . CT, CHEST, ABNORMAL 08/16/2008  . CHEST PAIN 06/24/2008  . BRADYCARDIA 06/23/2008  . GERD 06/23/2008  . SYNCOPE 06/23/2008  . B12 deficiency 04/09/2008  . Pain in Soft Tissues of Limb 01/08/2008  . RASH AND OTHER NONSPECIFIC SKIN ERUPTION 01/08/2008  . SINUSITIS, ACUTE  10/31/2007  . HIP PAIN 10/31/2007  . TOBACCO USE DISORDER/SMOKER-SMOKING CESSATION DISCUSSED 04/21/2007  . Essential hypertension 04/21/2007  . MENOPAUSAL SYNDROME 04/21/2007  . ALLERGIC RHINITIS 04/19/2007  . COLONIC POLYPS, HX OF 04/19/2007   Ihor Austin, LPTA; CBIS [phone]   Aldona Lento 10/17/2016, 6:31 PM  [place], [place], [address] Phone: [phone]   Fax:  [phone]  Name: Courtney Nielsen MRN: [id number] Date of Birth: [birth year]

## 2016-10-17 NOTE — Patient Instructions (Addendum)
Flexors, Standing    Stand, one foot on chair. Use support for balance, if needed. Slowly shift weight toward raised knee until stretch is felt in quadriceps of standing leg and hamstrings of forward leg. Hold 30 seconds.  Repeat 3 times per session. Do 2 sessions per day.  Copyright  VHI. All rights reserved.   Piriformis Stretch, Supine    Lie supine, one ankle crossed onto opposite knee. Holding bottom leg behind knee, gently pull legs toward chest until stretch is felt in buttock of top leg. Hold 30 seconds. For deeper stretch gently push top knee away from body.  Repeat 3 times per session. Do 2 sessions per day.  Copyright  VHI. All rights reserved.

## 2016-10-23 ENCOUNTER — Ambulatory Visit (HOSPITAL_COMMUNITY): Payer: BLUE CROSS/BLUE SHIELD

## 2016-10-23 DIAGNOSIS — R2689 Other abnormalities of gait and mobility: Secondary | ICD-10-CM

## 2016-10-23 DIAGNOSIS — R29898 Other symptoms and signs involving the musculoskeletal system: Secondary | ICD-10-CM

## 2016-10-23 DIAGNOSIS — M545 Low back pain: Secondary | ICD-10-CM | POA: Diagnosis not present

## 2016-10-23 NOTE — Therapy (Signed)
[place], [place], [address] Phone: [phone]   Fax:  [phone]  Physical Therapy Treatment  Patient Details  Name: Courtney Nielsen MRN: [id number] Date of Birth: Feb 20, 1956 Referring Provider: Erline Levine, MD  Encounter Date: 10/23/2016      PT End of Session - 10/23/16 1741    Visit Number 3   Number of Visits 5   Date for PT Re-Evaluation 11/12/16   Authorization Type [employer]   Authorization Time Period 10/08/16 to 11/12/16   PT Start Time 1735   PT Stop Time 1820   PT Time Calculation (min) 45 min   Activity Tolerance Patient tolerated treatment well;No increased pain   Behavior During Therapy WFL for tasks assessed/performed      Past Medical History:  Diagnosis Date  . Allergic rhinitis   . Arthritis   . Chest pain   . Colonic polyp   . COPD (chronic obstructive pulmonary disease) ([place])    patient denies at preop on 10/12/15   . Dysrhythmia    hx of every third beat is " off" per patient   . GERD (gastroesophageal reflux disease)    pt denies  . Hypertension   . Pneumonia    hx of 2014   . Smoker   . Vitamin B12 deficiency     Past Surgical History:  Procedure Laterality Date  . CARDIAC CATHETERIZATION     cath negative per patient   . CATARACT EXTRACTION W/PHACO Right 06/11/2016   Procedure: CATARACT EXTRACTION PHACO AND INTRAOCULAR LENS PLACEMENT RIGHT EYE CDE=6.15;  Surgeon: Tonny Branch, MD;  Location: [facility];  Service: Ophthalmology;  Laterality: Right;  right  . COLONOSCOPY    . KNEE ARTHROSCOPY Right 10/19/2015   Procedure: RIGHT KNEE SCOPE WITH MENISCAL DEBRIDEMENT,LATERAL SYNOVECTOMY;  Surgeon: Gaynelle Arabian, MD;  Location: [facility];  Service: Orthopedics;  Laterality: Right;    There were no vitals filed for this visit.      Subjective Assessment - 10/23/16 1737    Subjective Pt stated current LBP 4/10, dull achey pain.  Pt. c/o increased pain Rt knee 8/10   Pertinent History  Rt knee scope 2017, GERD, HTN, COPD   Patient Stated Goals "stretch out my back"   Currently in Pain? Yes   Pain Score 4    Pain Location Back  LBP and Rt knee pain   Pain Orientation Lower   Pain Descriptors / Indicators Dull;Aching   Pain Type Chronic pain   Pain Radiating Towards none   Pain Onset More than a month ago   Pain Frequency Constant   Aggravating Factors  being up on her feet too long   Pain Relieving Factors stretching, injections and muscle relaxers   Effect of Pain on Daily Activities increased pain during the day at work and with other house hold tasks           [facility] Adult PT Treatment/Exercise - 10/23/16 0001      Bed Mobility   Bed Mobility Left Sidelying to Sit   Sit to Sidelying Left 5: Supervision   Sit to Sidelying Left Details (indicate cue type and reason) Pt able to demonstrate approraite mechanics without cueing required     Transfers   Five time sit to stand comments  12.6 no UE A     Lumbar Exercises: Stretches   Lower Trunk Rotation Limitations 10x 10" holds   Hip Flexor Stretch 3 reps;30 seconds   Hip Flexor  Stretch Limitations standing hip flexor stretch 2nd step   Piriformis Stretch 3 reps;30 seconds   Piriformis Stretch Limitations supine with towel     Lumbar Exercises: Standing   Other Standing Lumbar Exercises sidestep with RTB 2RT   Other Standing Lumbar Exercises side step over 12in hurdles 2RT no HHA this session     Lumbar Exercises: Seated   Sit to Stand 5 reps   Sit to Stand Limitations 12.6 no HHA  noted crepitis Rt knee   Other Seated Lumbar Exercises GTB seated rotation            PT Short Term Goals - 10/09/16 0622      PT SHORT TERM GOAL #1   Title Pt will demo understanding and consistency with her HEP to improve postural awareness and activity tolerance.    Time 1   Period Weeks   Status New           PT Long Term Goals - 10/09/16 8032      PT LONG TERM GOAL #1   Title Pt will demo improved body  mechanics, evident by her ability to demonstrate proper set up and verbalize use of lumbar roll at home and work to increase sitting tolerance.    Time 5   Period Weeks   Status New     PT LONG TERM GOAL #2   Title Pt will demo consistent use of proper log roll technique throughout her session, without cues from therapist, to decrease low back strain during transitions in and out of bed.   Time 5   Period Weeks   Status New     PT LONG TERM GOAL #3   Title Pt will complete 5x sit to stand in less than 10 sec without UE support, which will demonstrate an improvement in functional strength and power.    Time 5   Period Weeks   Status New     PT LONG TERM GOAL #4   Title Pt will demo improved LE strength to 5/5 MMT which will increase her safety with daily functional activity.    Time 5   Period Weeks   Status New               Plan - 10/23/16 1745    Clinical Impression Statement Pt presents with improved bed mobility with ability to demonstrate and verbalize appropriate form and technqiue with no cueing required today.  Session focus on improving hip mobility and gluteal strengthening to improve gait mechanics and pain control.  Pt able to demonstrate appropriate technqiue with minimal cueing required through session  for form.  Pt did c/o Rt knee pain during sidestepping therex, able to tolerate all exercises.  Added resistance with core strengthening.  No reports of LBP at EOS.     Rehab Potential Good   PT Frequency 1x / week   PT Duration --  5 weeks   PT Treatment/Interventions ADLs/Self Care Home Management;Cryotherapy;Moist Heat;Gait training;Functional mobility training;Therapeutic activities;Therapeutic exercise;Neuromuscular re-education;Balance training;Patient/family education;Passive range of motion;Dry needling   PT Next Visit Plan hip flexibility (hip flexor and piriformis); hip abductor strengthening (side stepping, side stepping over hurdle); trunk stabilization  and movement    PT Home Exercise Plan seated piriformis stretch, supine hip flexor stretch, seated trunk rotation stretch, lumbar roll, log roll; 04/09 modified stretches for comfort standing hip flexor stretch and piriformis figure 4 in supine      Patient will benefit from skilled therapeutic intervention in order to improve  the following deficits and impairments:  Abnormal gait, Decreased activity tolerance, Decreased endurance, Decreased mobility, Difficulty walking, Impaired flexibility, Postural dysfunction, Pain, Improper body mechanics, Hypomobility, Decreased strength, Decreased range of motion  Visit Diagnosis: Low back pain, unspecified back pain laterality, unspecified chronicity, with sciatica presence unspecified  Other abnormalities of gait and mobility  Other symptoms and signs involving the musculoskeletal system     Problem List Patient Active Problem List   Diagnosis Date Noted  . LLQ abdominal pain 03/15/2016  . Lateral meniscal tear 10/19/2015  . Left hip pain 07/22/2013  . Occipital neuralgia 06/04/2013  . Leg pain 02/22/2012  . Gout 02/22/2012  . Weight gain 01/11/2012  . Edema leg 06/07/2011  . Pruritus 04/23/2011  . Well adult exam 04/23/2011  . Pneumonia, organism unspecified(486) 04/21/2011  . Cough 04/19/2011  . Paresthesia 09/26/2010  . NECK PAIN 08/22/2010  . BACK PAIN, THORACIC REGION 08/22/2010  . Low back pain 08/22/2010  . DIZZINESS 08/22/2010  . CERUMEN IMPACTION 07/21/2010  . CRAMP OF LIMB 03/16/2010  . BRONCHITIS, ACUTE 07/22/2009  . COPD 07/22/2009  . MEDIASTINAL LYMPHADENOPATHY 08/16/2008  . Nonspecific (abnormal) findings on radiological and other examination of body structure 08/16/2008  . CT, CHEST, ABNORMAL 08/16/2008  . CHEST PAIN 06/24/2008  . BRADYCARDIA 06/23/2008  . GERD 06/23/2008  . SYNCOPE 06/23/2008  . B12 deficiency 04/09/2008  . Pain in Soft Tissues of Limb 01/08/2008  . RASH AND OTHER NONSPECIFIC SKIN ERUPTION  01/08/2008  . SINUSITIS, ACUTE 10/31/2007  . HIP PAIN 10/31/2007  . TOBACCO USE DISORDER/SMOKER-SMOKING CESSATION DISCUSSED 04/21/2007  . Essential hypertension 04/21/2007  . MENOPAUSAL SYNDROME 04/21/2007  . ALLERGIC RHINITIS 04/19/2007  . COLONIC POLYPS, HX OF 04/19/2007   Ihor Austin, LPTA; [place]  Aldona Lento 10/23/2016, 6:25 PM  [place], [place], [address] Phone: [phone]   Fax:  [phone]  Name: Courtney Nielsen MRN: [id number] Date of Birth: 06/14/1956

## 2016-10-25 ENCOUNTER — Encounter (HOSPITAL_COMMUNITY): Payer: BLUE CROSS/BLUE SHIELD

## 2016-10-25 DIAGNOSIS — M47816 Spondylosis without myelopathy or radiculopathy, lumbar region: Secondary | ICD-10-CM | POA: Diagnosis not present

## 2016-10-29 ENCOUNTER — Telehealth (HOSPITAL_COMMUNITY): Payer: Self-pay | Admitting: Internal Medicine

## 2016-10-29 NOTE — Telephone Encounter (Signed)
10/29/16  She cx because she said that she didn't want to come in "with this mess"... Sounded congested like a bad cold.  RS for 5/3

## 2016-10-30 ENCOUNTER — Ambulatory Visit (HOSPITAL_COMMUNITY): Payer: BLUE CROSS/BLUE SHIELD

## 2016-10-31 ENCOUNTER — Ambulatory Visit (INDEPENDENT_AMBULATORY_CARE_PROVIDER_SITE_OTHER): Payer: BLUE CROSS/BLUE SHIELD | Admitting: Internal Medicine

## 2016-10-31 ENCOUNTER — Encounter: Payer: Self-pay | Admitting: Internal Medicine

## 2016-10-31 DIAGNOSIS — Z01818 Encounter for other preprocedural examination: Secondary | ICD-10-CM

## 2016-10-31 DIAGNOSIS — M545 Low back pain: Secondary | ICD-10-CM | POA: Diagnosis not present

## 2016-10-31 DIAGNOSIS — R635 Abnormal weight gain: Secondary | ICD-10-CM | POA: Diagnosis not present

## 2016-10-31 DIAGNOSIS — H6123 Impacted cerumen, bilateral: Secondary | ICD-10-CM | POA: Diagnosis not present

## 2016-10-31 DIAGNOSIS — K219 Gastro-esophageal reflux disease without esophagitis: Secondary | ICD-10-CM

## 2016-10-31 DIAGNOSIS — M1000 Idiopathic gout, unspecified site: Secondary | ICD-10-CM | POA: Diagnosis not present

## 2016-10-31 DIAGNOSIS — E538 Deficiency of other specified B group vitamins: Secondary | ICD-10-CM | POA: Diagnosis not present

## 2016-10-31 MED ORDER — CELECOXIB 200 MG PO CAPS: 200.0000 mg | ORAL_CAPSULE | Freq: Two times a day (BID) | ORAL | 5 refills | Status: DC

## 2016-10-31 MED ORDER — ALPRAZOLAM 0.5 MG PO TABS: ORAL_TABLET | ORAL | 1 refills | Status: DC

## 2016-10-31 NOTE — Assessment & Plan Note (Signed)
The patient is clear for her R TKR surgery planned for 12/03/16, assuming her pre-op routine labs and EKG are acceptable. Thank you!

## 2016-10-31 NOTE — Progress Notes (Signed)
Pre visit review using our clinic review tool, if applicable. No additional management support is needed unless otherwise documented below in the visit note. 

## 2016-10-31 NOTE — Assessment & Plan Note (Signed)
On B12 

## 2016-10-31 NOTE — Assessment & Plan Note (Addendum)
Better after injections for spinal stenosis

## 2016-10-31 NOTE — Progress Notes (Signed)
Subjective:  Patient ID: Courtney Nielsen, female    DOB: Oct 15, 1955  Age: 61 y.o. MRN: [id number]  CC: No chief complaint on file.   HPI   Courtney Nielsen presents for IM med consult Requested by Dr Wynelle Link Reason: R TKR med clearance planned for 12/03/16   Hx: The pt is planning to have a R TKR on 12/03/16. F/u HTN, anxiety, LBP - stable   Past Medical History:  Diagnosis Date  . Allergic rhinitis   . Arthritis   . Chest pain   . Colonic polyp   . COPD (chronic obstructive pulmonary disease) ([place])    patient denies at preop on 10/12/15   . Dysrhythmia    hx of every third beat is " off" per patient   . GERD (gastroesophageal reflux disease)    pt denies  . Hypertension   . Pneumonia    hx of 2014   . Smoker   . Vitamin B12 deficiency    Past Surgical History:  Procedure Laterality Date  . CARDIAC CATHETERIZATION     cath negative per patient   . CATARACT EXTRACTION W/PHACO Right 06/11/2016   Procedure: CATARACT EXTRACTION PHACO AND INTRAOCULAR LENS PLACEMENT RIGHT EYE CDE=6.15;  Surgeon: Tonny Branch, MD;  Location: [facility];  Service: Ophthalmology;  Laterality: Right;  right  . COLONOSCOPY    . KNEE ARTHROSCOPY Right 10/19/2015   Procedure: RIGHT KNEE SCOPE WITH MENISCAL DEBRIDEMENT,LATERAL SYNOVECTOMY;  Surgeon: Gaynelle Arabian, MD;  Location: [facility];  Service: Orthopedics;  Laterality: Right;    reports that she quit smoking about 5 years ago. Her smoking use included Cigarettes. She has a 20.00 pack-year smoking history. She has never used smokeless tobacco. She reports that she does not drink alcohol or use drugs. family history includes Arthritis in her father and mother; Asthma in her brother; COPD in her father; Cancer in her father; Diabetes in her maternal aunt; Emphysema in her paternal grandfather; Heart disease in her mother; Hypertension in her other; Lung cancer in her father; Lymphoma in her father. Allergies  Allergen Reactions  . Meloxicam Swelling and  Other (See Comments)    Leg swelling  . Dilaudid [Hydromorphone Hcl]     Burning of the face  . Levaquin [Levofloxacin]     Head itching  . Ceftin [Cefuroxime Axetil] Itching and Other (See Comments)    pruritis  . Coreg [Carvedilol] Nausea Only  . Furosemide Other (See Comments)    REACTION: cramps  . Prednisone Itching and Other (See Comments)    Patient states she has tolerated  . Sulfonamide Derivatives Nausea And Vomiting  . Verapamil Other (See Comments)    edema    Outpatient Medications Prior to Visit  Medication Sig Dispense Refill  . acetaminophen (TYLENOL) 500 MG tablet Take 1,000 mg by mouth every 6 (six) hours as needed for moderate pain.    Marland Kitchen ALPRAZolam (XANAX) 0.5 MG tablet TAKE ONE TABLET BY MOUTH AT BEDTIME AS NEEDED FOR ANXIETY. 30 tablet 1  . aspirin EC 81 MG tablet Take 81 mg by mouth at bedtime.     . celecoxib (CELEBREX) 200 MG capsule TAKE ONE CAPSULE BY MOUTH TWICE DAILY. 60 capsule 5  . cholecalciferol (VITAMIN D) 1000 units tablet Take 1,000 Units by mouth at bedtime.    Marland Kitchen estradiol (ESTRACE) 0.5 MG tablet Take 0.5 mg by mouth at bedtime.     Marland Kitchen loratadine (CLARITIN) 10 MG tablet Take 10 mg by mouth at bedtime.    Marland Kitchen  losartan (COZAAR) 100 MG tablet Take 1 tablet (100 mg total) by mouth every evening. (Patient taking differently: Take 100 mg by mouth at bedtime. ) 90 tablet 3  . medroxyPROGESTERone (PROVERA) 2.5 MG tablet Take 2.5 mg by mouth at bedtime.     . Menthol, Topical Analgesic, (BLUE-EMU MAXIMUM STRENGTH EX) Apply 1 application topically 4 (four) times daily as needed (for pain.).     Marland Kitchen Omega-3 Fatty Acids (FISH OIL) 1200 MG CAPS Take 1,200 mg by mouth at bedtime.    . vitamin B-12 (CYANOCOBALAMIN) 500 MCG tablet Take 500 mcg by mouth every other day.    Marland Kitchen HYDROcodone-acetaminophen (NORCO/VICODIN) 5-325 MG tablet Take 1 tablet by mouth every 6 (six) hours as needed for moderate pain. 20 tablet 0   No facility-administered medications prior to visit.      ROS Review of Systems  Constitutional: Negative for activity change, appetite change, chills, fatigue and unexpected weight change.  HENT: Negative for congestion, mouth sores, sinus pressure and voice change.   Eyes: Negative for visual disturbance.  Respiratory: Negative for cough and chest tightness.   Cardiovascular: Negative for chest pain and palpitations.  Gastrointestinal: Negative for abdominal pain, constipation, diarrhea and nausea.  Genitourinary: Negative for difficulty urinating, frequency and vaginal pain.  Musculoskeletal: Positive for arthralgias, back pain and gait problem.  Skin: Negative for pallor and rash.  Neurological: Negative for dizziness, tremors, weakness, numbness and headaches.  Psychiatric/Behavioral: Negative for confusion, dysphoric mood, sleep disturbance and suicidal ideas. The patient is not nervous/anxious.     Objective:  BP 134/86 (BP Location: Left Arm, Patient Position: Sitting, Cuff Size: Large)   Pulse 83   Temp 97.9 F (36.6 C) (Oral)   Ht 5\' 10"  (1.778 m)   Wt 228 lb 1.3 oz (103.5 kg)   SpO2 98%   BMI 32.73 kg/m   BP Readings from Last 3 Encounters:  10/31/16 134/86  09/11/16 126/82  06/11/16 (!) 147/75    Wt Readings from Last 3 Encounters:  10/31/16 228 lb 1.3 oz (103.5 kg)  09/11/16 233 lb (105.7 kg)  06/01/16 230 lb (104.3 kg)    Physical Exam  Constitutional: She appears well-developed. No distress.  HENT:  Head: Normocephalic.  Right Ear: External ear normal.  Left Ear: External ear normal.  Nose: Nose normal.  Mouth/Throat: Oropharynx is clear and moist.  Eyes: Conjunctivae are normal. Pupils are equal, round, and reactive to light. Right eye exhibits no discharge. Left eye exhibits no discharge.  Neck: Normal range of motion. Neck supple. No JVD present. No tracheal deviation present. No thyromegaly present.  Cardiovascular: Normal rate, regular rhythm and normal heart sounds.   Pulmonary/Chest: No stridor.  No respiratory distress. She has no wheezes.  Abdominal: Soft. Bowel sounds are normal. She exhibits no distension and no mass. There is no tenderness. There is no rebound and no guarding.  Musculoskeletal: She exhibits tenderness. She exhibits no edema.  Lymphadenopathy:    She has no cervical adenopathy.  Neurological: She displays normal reflexes. No cranial nerve deficit. She exhibits normal muscle tone. Coordination normal.  Skin: No rash noted. No erythema.  Psychiatric: She has a normal mood and affect. Her behavior is normal. Judgment and thought content normal.  LS, R knee - tender  Wax B Obese  Lab Results  Component Value Date   WBC 7.6 03/12/2016   HGB 14.6 03/12/2016   HCT 43.2 03/12/2016   PLT 251.0 03/12/2016   GLUCOSE 96 03/12/2016   CHOL 205 (  H) 03/12/2016   TRIG 145.0 03/12/2016   HDL 44.40 03/12/2016   LDLDIRECT 164.6 01/05/2008   LDLCALC 132 (H) 03/12/2016   ALT 18 03/12/2016   AST 15 03/12/2016   NA 141 03/12/2016   K 3.8 03/12/2016   CL 106 03/12/2016   CREATININE 1.02 03/12/2016   BUN 13 03/12/2016   CO2 27 03/12/2016   TSH 0.70 03/12/2016   HGBA1C 5.6 03/02/2014    Mr Lumbar Spine Wo Contrast  Result Date: 09/16/2016 CLINICAL DATA:  Low back pain for 1 week. Status post steroid injection on 09/11/2016. EXAM: MRI LUMBAR SPINE WITHOUT CONTRAST TECHNIQUE: Multiplanar, multisequence MR imaging of the lumbar spine was performed. No intravenous contrast was administered. COMPARISON:  Lumbar spine radiograph 09/11/2016 FINDINGS: Segmentation:  Normal Alignment:  Grade 1 retrolisthesis at L2-L3. Vertebrae: Degenerative endplate signal changes at L1-L2. No focal marrow lesion. No discitis osteomyelitis or epidural collection. Conus medullaris: Extends to the L1 level and appears normal. Paraspinal and other soft tissues: The visualized aorta, IVC and iliac vessels are normal. The visualized retroperitoneal organs and paraspinal soft tissues are normal. Disc  levels: T12-L1: Normal disc space and facets. No spinal canal or neuroforaminal stenosis. L1-L2: Disc space narrowing with small central protrusion superimposed on a small diffuse bulge. No spinal canal or neural foraminal stenosis. L2-L3: Disc space narrowing with mild diffuse bulge. No spinal canal or neural foraminal stenosis. Mild facet degeneration with fluid in both facet joints. L3-L4: Mild disc desiccation. No spinal canal or neural foraminal stenosis. L4-L5: Mild disc desiccation with left eccentric bulge. No spinal canal or neural foraminal stenosis. Moderate bilateral facet hypertrophy. L5-S1: Normal disc space and facets. No spinal canal or neuroforaminal stenosis. Visualized sacrum: Normal. IMPRESSION: 1. Grade 1 L2-L3 each retrolisthesis with multilevel degenerative disc disease. No spinal canal or neural foraminal stenosis. 2. Multilevel moderate facet arthrosis, worst at L2-L3 and L4-L5. Electronically Signed   By: Ulyses Jarred M.D.   On: 09/16/2016 03:26    Assessment & Plan:   There are no diagnoses linked to this encounter. I have discontinued Ms. Beidler's HYDROcodone-acetaminophen. I am also having her maintain her estradiol, medroxyPROGESTERone, aspirin EC, loratadine, acetaminophen, (Menthol, Topical Analgesic, (BLUE-EMU MAXIMUM STRENGTH EX)), losartan, celecoxib, Fish Oil, vitamin B-12, cholecalciferol, and ALPRAZolam.  No orders of the defined types were placed in this encounter.    Follow-up: No Follow-up on file.  Walker Kehr, MD

## 2016-10-31 NOTE — Assessment & Plan Note (Signed)
Refractory B - pt declined irrigation Irrigate at home or see ENT

## 2016-10-31 NOTE — Assessment & Plan Note (Signed)
Wt Readings from Last 3 Encounters:  10/31/16 228 lb 1.3 oz (103.5 kg)  09/11/16 233 lb (105.7 kg)  06/01/16 230 lb (104.3 kg)

## 2016-10-31 NOTE — Assessment & Plan Note (Signed)
No relapse 

## 2016-10-31 NOTE — Assessment & Plan Note (Signed)
Off Rx 

## 2016-11-01 ENCOUNTER — Encounter (HOSPITAL_COMMUNITY): Payer: BLUE CROSS/BLUE SHIELD

## 2016-11-01 ENCOUNTER — Ambulatory Visit (HOSPITAL_COMMUNITY): Payer: BLUE CROSS/BLUE SHIELD | Attending: Neurosurgery

## 2016-11-01 DIAGNOSIS — R2689 Other abnormalities of gait and mobility: Secondary | ICD-10-CM | POA: Insufficient documentation

## 2016-11-01 DIAGNOSIS — R29898 Other symptoms and signs involving the musculoskeletal system: Secondary | ICD-10-CM | POA: Insufficient documentation

## 2016-11-01 DIAGNOSIS — M545 Low back pain: Secondary | ICD-10-CM | POA: Diagnosis not present

## 2016-11-01 NOTE — Therapy (Signed)
[place], [place], [address] Phone: [phone]   Fax:  [phone]  Physical Therapy Treatment  Patient Details  Name: Courtney Nielsen MRN: [id number] Date of Birth: [birth year] Referring Provider: Erline Levine, MD  Encounter Date: 11/01/2016      PT End of Session - 11/01/16 1757    Visit Number 4   Number of Visits 5   Date for PT Re-Evaluation 11/12/16   Authorization Type BC[employer]   Authorization Time Period 10/08/16 to 11/12/16   PT Start Time 1742  Pt 8 min late for apt   PT Stop Time 1820   PT Time Calculation (min) 38 min   Activity Tolerance Patient tolerated treatment well;No increased pain   Behavior During Therapy WFL for tasks assessed/performed      Past Medical History:  Diagnosis Date  . Allergic rhinitis   . Arthritis   . Chest pain   . Colonic polyp   . COPD (chronic obstructive pulmonary disease) ([place])    patient denies at preop on 10/12/15   . Dysrhythmia    hx of every third beat is " off" per patient   . GERD (gastroesophageal reflux disease)    pt denies  . Hypertension   . Pneumonia    hx of 2014   . Smoker   . Vitamin B12 deficiency     Past Surgical History:  Procedure Laterality Date  . CARDIAC CATHETERIZATION     cath negative per patient   . CATARACT EXTRACTION W/PHACO Right 06/11/2016   Procedure: CATARACT EXTRACTION PHACO AND INTRAOCULAR LENS PLACEMENT RIGHT EYE CDE=6.15;  Surgeon: Tonny Branch, MD;  Location: [facility];  Service: Ophthalmology;  Laterality: Right;  right  . COLONOSCOPY    . KNEE ARTHROSCOPY Right 10/19/2015   Procedure: RIGHT KNEE SCOPE WITH MENISCAL DEBRIDEMENT,LATERAL SYNOVECTOMY;  Surgeon: Gaynelle Arabian, MD;  Location: [facility];  Service: Orthopedics;  Laterality: Right;    There were no vitals filed for this visit.      Subjective Assessment - 11/01/16 1746    Subjective Reports back is feeling good today, unless stands for long periods of time.   Reports constant pain Rt knee scale 5/10 achey pain.     Pertinent History Rt knee scope 2017, GERD, HTN, COPD   Patient Stated Goals "stretch out my back"   Currently in Pain? No/denies   Pain Score 5   0/10 LBP; Rt knee pain scale 5/10 achey pain   Aggravating Factors  being on feet too long   Pain Relieving Factors stretches, injections and muscle relaxors   Effect of Pain on Daily Activities increased pain during the day at work and with other house hold tasks                         [facility] Adult PT Treatment/Exercise - 11/01/16 0001      Lumbar Exercises: Stretches   Lower Trunk Rotation Limitations 10x 10" holds   Hip Flexor Stretch 3 reps;30 seconds   Hip Flexor Stretch Limitations standing hip flexor stretch 2nd step   Piriformis Stretch 3 reps;30 seconds     Lumbar Exercises: Standing   Other Standing Lumbar Exercises Knee flexion with good posture/form; sidestep with RTB 2RT- held due to reports of increased Rt knee pain folllowing   Other Standing Lumbar Exercises side step over 12in hurdles 4RT with 5" holdsno HHA this session     Lumbar Exercises: Seated  Other Seated Lumbar Exercises GTB seated rotation for core strengthening     Lumbar Exercises: Sidelying   Hip Abduction 10 reps   Hip Abduction Limitations 2 sets infront of wall                  PT Short Term Goals - 10/09/16 0622      PT SHORT TERM GOAL #1   Title Pt will demo understanding and consistency with her HEP to improve postural awareness and activity tolerance.    Time 1   Period Weeks   Status New           PT Long Term Goals - 10/09/16 8657      PT LONG TERM GOAL #1   Title Pt will demo improved body mechanics, evident by her ability to demonstrate proper set up and verbalize use of lumbar roll at home and work to increase sitting tolerance.    Time 5   Period Weeks   Status New     PT LONG TERM GOAL #2   Title Pt will demo consistent use of proper log roll  technique throughout her session, without cues from therapist, to decrease low back strain during transitions in and out of bed.   Time 5   Period Weeks   Status New     PT LONG TERM GOAL #3   Title Pt will complete 5x sit to stand in less than 10 sec without UE support, which will demonstrate an improvement in functional strength and power.    Time 5   Period Weeks   Status New     PT LONG TERM GOAL #4   Title Pt will demo improved LE strength to 5/5 MMT which will increase her safety with daily functional activity.    Time 5   Period Weeks   Status New               Plan - 11/01/16 1833    Clinical Impression Statement Pt presents with improved posture awareness, reports pain free back and compliance wiht HEP, improved bed mobilty.  Pt did c/o Rt knee pain with sidestepping activties so held this session.  Began proximal strengthening for gluteal medius and hamstring with min cueing for form with no reports of pain with exercises.  Pt presents wiht improved posture and overall mobilty with gait mechanics.     Rehab Potential Good   PT Frequency 1x / week   PT Duration --  5 weeks   PT Treatment/Interventions ADLs/Self Care Home Management;Cryotherapy;Moist Heat;Gait training;Functional mobility training;Therapeutic activities;Therapeutic exercise;Neuromuscular re-education;Balance training;Patient/family education;Passive range of motion;Dry needling   PT Next Visit Plan Reassess next session   PT Home Exercise Plan seated piriformis stretch, supine hip flexor stretch, seated trunk rotation stretch, lumbar roll, log roll; 04/09 modified stretches for comfort standing hip flexor stretch and piriformis figure 4 in supine      Patient will benefit from skilled therapeutic intervention in order to improve the following deficits and impairments:  Abnormal gait, Decreased activity tolerance, Decreased endurance, Decreased mobility, Difficulty walking, Impaired flexibility, Postural  dysfunction, Pain, Improper body mechanics, Hypomobility, Decreased strength, Decreased range of motion  Visit Diagnosis: Low back pain, unspecified back pain laterality, unspecified chronicity, with sciatica presence unspecified  Other abnormalities of gait and mobility  Other symptoms and signs involving the musculoskeletal system     Problem List Patient Active Problem List   Diagnosis Date Noted  . LLQ abdominal pain 03/15/2016  . Lateral meniscal  tear 10/19/2015  . Left hip pain 07/22/2013  . Occipital neuralgia 06/04/2013  . Leg pain 02/22/2012  . Gout 02/22/2012  . Weight gain 01/11/2012  . Edema leg 06/07/2011  . Pruritus 04/23/2011  . Preop exam for internal medicine 04/23/2011  . Pneumonia, organism unspecified(486) 04/21/2011  . Cough 04/19/2011  . Paresthesia 09/26/2010  . NECK PAIN 08/22/2010  . BACK PAIN, THORACIC REGION 08/22/2010  . Low back pain 08/22/2010  . DIZZINESS 08/22/2010  . Cerumen impaction 07/21/2010  . CRAMP OF LIMB 03/16/2010  . BRONCHITIS, ACUTE 07/22/2009  . COPD 07/22/2009  . MEDIASTINAL LYMPHADENOPATHY 08/16/2008  . Nonspecific (abnormal) findings on radiological and other examination of body structure 08/16/2008  . CT, CHEST, ABNORMAL 08/16/2008  . CHEST PAIN 06/24/2008  . BRADYCARDIA 06/23/2008  . GERD 06/23/2008  . SYNCOPE 06/23/2008  . B12 deficiency 04/09/2008  . Pain in Soft Tissues of Limb 01/08/2008  . RASH AND OTHER NONSPECIFIC SKIN ERUPTION 01/08/2008  . SINUSITIS, ACUTE 10/31/2007  . HIP PAIN 10/31/2007  . TOBACCO USE DISORDER/SMOKER-SMOKING CESSATION DISCUSSED 04/21/2007  . Essential hypertension 04/21/2007  . MENOPAUSAL SYNDROME 04/21/2007  . ALLERGIC RHINITIS 04/19/2007  . COLONIC POLYPS, HX OF 04/19/2007   Ihor Austin, LPTA; CBIS [phone]  Aldona Lento 11/01/2016, 6:44 PM  [place], [place], [address] Phone: [phone]   Fax:   [phone]  Name: KELBI RENSTROM MRN: [id number] Date of Birth: [birth year]

## 2016-11-06 ENCOUNTER — Ambulatory Visit (HOSPITAL_COMMUNITY): Payer: BLUE CROSS/BLUE SHIELD

## 2016-11-08 ENCOUNTER — Ambulatory Visit (HOSPITAL_COMMUNITY): Payer: BLUE CROSS/BLUE SHIELD | Admitting: Physical Therapy

## 2016-11-08 DIAGNOSIS — M545 Low back pain: Secondary | ICD-10-CM | POA: Diagnosis not present

## 2016-11-08 DIAGNOSIS — R29898 Other symptoms and signs involving the musculoskeletal system: Secondary | ICD-10-CM

## 2016-11-08 DIAGNOSIS — R2689 Other abnormalities of gait and mobility: Secondary | ICD-10-CM | POA: Diagnosis not present

## 2016-11-08 NOTE — Therapy (Signed)
[place], [place], [address] Phone: [phone]   Fax:  [phone]  Physical Therapy Treatment/Discharge  Patient Details  Name: Courtney Nielsen MRN: [id number] Date of Birth: [birth year] Referring Provider: Erline Levine, MD   Encounter Date: 11/08/2016      PT End of Session - 11/08/16 1830    Visit Number 5   Number of Visits 5   Date for PT Re-Evaluation 11/12/16   Authorization Type BC[employer]   Authorization Time Period 10/08/16 to 11/12/16   PT Start Time 5631   PT Stop Time 1814   PT Time Calculation (min) 40 min   Activity Tolerance Patient tolerated treatment well;No increased pain   Behavior During Therapy WFL for tasks assessed/performed      Past Medical History:  Diagnosis Date  . Allergic rhinitis   . Arthritis   . Chest pain   . Colonic polyp   . COPD (chronic obstructive pulmonary disease) ([place])    patient denies at preop on 10/12/15   . Dysrhythmia    hx of every third beat is " off" per patient   . GERD (gastroesophageal reflux disease)    pt denies  . Hypertension   . Pneumonia    hx of 2014   . Smoker   . Vitamin B12 deficiency     Past Surgical History:  Procedure Laterality Date  . CARDIAC CATHETERIZATION     cath negative per patient   . CATARACT EXTRACTION W/PHACO Right 06/11/2016   Procedure: CATARACT EXTRACTION PHACO AND INTRAOCULAR LENS PLACEMENT RIGHT EYE CDE=6.15;  Surgeon: Tonny Branch, MD;  Location: [facility];  Service: Ophthalmology;  Laterality: Right;  right  . COLONOSCOPY    . KNEE ARTHROSCOPY Right 10/19/2015   Procedure: RIGHT KNEE SCOPE WITH MENISCAL DEBRIDEMENT,LATERAL SYNOVECTOMY;  Surgeon: Gaynelle Arabian, MD;  Location: [facility];  Service: Orthopedics;  Laterality: Right;    There were no vitals filed for this visit.      Subjective Assessment - 11/08/16 1739    Subjective Pt reports that things are going well. She is scheduled to have her knee replacement  surgery on 12/03/16. She has some issues with being up and moving around for extended time. She feels that she is atleast 8-% improved.   Pertinent History Rt knee scope 2017, GERD, HTN, COPD   How long can you stand comfortably? 30-40 min   How long can you walk comfortably? 40 min   Patient Stated Goals "stretch out my back"   Currently in Pain? No/denies            [facility] PT Assessment - 11/08/16 0001      Assessment   Medical Diagnosis Lumbar pain    Referring Provider Erline Levine, MD    Onset Date/Surgical Date --  Mid March    Next MD Visit 11/21/16   Prior Therapy none      Precautions   Precautions None     Home Environment   Living Environment Private residence     Prior Function   Level of Independence Independent   Vocation Full time employment   Vocation Requirements sitting at a desk      Cognition   Overall Cognitive Status Within Functional Limits for tasks assessed     Observation/Other Assessments   Focus on Therapeutic Outcomes (FOTO)  28% limited      Sensation   Light Touch Appears Intact     AROM   Overall AROM  Comments Lumbar AROM WNL and pain free     Strength   Right Hip Flexion 5/5   Right Hip Extension 5/5   Right Hip ABduction 5/5   Left Hip Flexion 5/5   Left Hip Extension 5/5   Left Hip ABduction 5/5   Right Knee Flexion 5/5   Right Knee Extension 5/5   Left Knee Flexion 4+/5   Left Knee Extension 5/5   Right Ankle Dorsiflexion 5/5   Left Ankle Dorsiflexion 5/5     Flexibility   Soft Tissue Assessment /Muscle Length yes   Hamstrings WNL    Quadriceps 25% limited Rt>Lt   Piriformis 50% limited BLE      Palpation   Palpation comment --     Transfers   Five time sit to stand comments  6.9 sec, no UE support      Ambulation/Gait   Gait Comments (+) trendelenburg, pt walking on lateral aspect of her feet, Lt>Rt                             PT Education - 11/08/16 1826    Education provided Yes    Education Details reviewed goals and technique with HEP; made several additions/updates to HEP; encouraged pt to continue hip strengthening and low back flexibility exercises leading up to TKA for improved outcomes; reviewed proped lifting mechanics; typical muscle response during prolonged activity and low back pain as a result of wear and tear over years of activity.    Person(s) Educated Patient   Methods Explanation   Comprehension Verbalized understanding          PT Short Term Goals - 11/08/16 1759      PT SHORT TERM GOAL #1   Title Pt will demo understanding and consistency with her HEP to improve postural awareness and activity tolerance.    Time 1   Period Weeks   Status Achieved           PT Long Term Goals - 11/08/16 1800      PT LONG TERM GOAL #1   Title Pt will demo improved body mechanics, evident by her ability to demonstrate proper set up and verbalize use of lumbar roll at home and work to increase sitting tolerance.    Time 5   Period Weeks   Status Achieved     PT LONG TERM GOAL #2   Title Pt will demo consistent use of proper log roll technique throughout her session, without cues from therapist, to decrease low back strain during transitions in and out of bed.   Time 5   Period Weeks   Status Achieved     PT LONG TERM GOAL #3   Title Pt will complete 5x sit to stand in less than 10 sec without UE support, which will demonstrate an improvement in functional strength and power.    Time 5   Period Weeks   Status Achieved     PT LONG TERM GOAL #4   Title Pt will demo improved LE strength to 5/5 MMT which will increase her safety with daily functional activity.    Time 5   Period Weeks   Status Achieved               Plan - 11/08/16 1831    Clinical Impression Statement Pt was discharged this visit having met all of her goals and demonstrating improvements in BLE strength, lumbar AROM, LE flexibility and  increased activity tolerance. She has  been consistently performing her HEP without too much difficulty and is currently very pleased with her progress. Her only remaining limitation is increased low back soreness following prolonged standing activity, although this quickly resolves after ~5 min of resting. She is in agreement with discharge at this time, as she is preparing for a Rt TKA in June. She plans to continue with her updated HEP in order to maintain and further improve her overall strength and mobility.    Rehab Potential Good   PT Frequency 1x / week   PT Duration --  5 weeks   PT Treatment/Interventions ADLs/Self Care Home Management;Cryotherapy;Moist Heat;Gait training;Functional mobility training;Therapeutic activities;Therapeutic exercise;Neuromuscular re-education;Balance training;Patient/family education;Passive range of motion;Dry needling   PT Next Visit Plan d/c home with HEP   PT Home Exercise Plan seated trunk rotation stretch, standing hip flexor stretch, step ups, bridges   Consulted and Agree with Plan of Care Patient      Patient will benefit from skilled therapeutic intervention in order to improve the following deficits and impairments:  Abnormal gait, Decreased activity tolerance, Decreased endurance, Decreased mobility, Difficulty walking, Impaired flexibility, Postural dysfunction, Pain, Improper body mechanics, Hypomobility, Decreased strength, Decreased range of motion  Visit Diagnosis: Low back pain, unspecified back pain laterality, unspecified chronicity, with sciatica presence unspecified  Other abnormalities of gait and mobility  Other symptoms and signs involving the musculoskeletal system     Problem List Patient Active Problem List   Diagnosis Date Noted  . LLQ abdominal pain 03/15/2016  . Lateral meniscal tear 10/19/2015  . Left hip pain 07/22/2013  . Occipital neuralgia 06/04/2013  . Leg pain 02/22/2012  . Gout 02/22/2012  . Weight gain 01/11/2012  . Edema leg 06/07/2011  .  Pruritus 04/23/2011  . Preop exam for internal medicine 04/23/2011  . Pneumonia, organism unspecified(486) 04/21/2011  . Cough 04/19/2011  . Paresthesia 09/26/2010  . NECK PAIN 08/22/2010  . BACK PAIN, THORACIC REGION 08/22/2010  . Low back pain 08/22/2010  . DIZZINESS 08/22/2010  . Cerumen impaction 07/21/2010  . CRAMP OF LIMB 03/16/2010  . BRONCHITIS, ACUTE 07/22/2009  . COPD 07/22/2009  . MEDIASTINAL LYMPHADENOPATHY 08/16/2008  . Nonspecific (abnormal) findings on radiological and other examination of body structure 08/16/2008  . CT, CHEST, ABNORMAL 08/16/2008  . CHEST PAIN 06/24/2008  . BRADYCARDIA 06/23/2008  . GERD 06/23/2008  . SYNCOPE 06/23/2008  . B12 deficiency 04/09/2008  . Pain in Soft Tissues of Limb 01/08/2008  . RASH AND OTHER NONSPECIFIC SKIN ERUPTION 01/08/2008  . SINUSITIS, ACUTE 10/31/2007  . HIP PAIN 10/31/2007  . TOBACCO USE DISORDER/SMOKER-SMOKING CESSATION DISCUSSED 04/21/2007  . Essential hypertension 04/21/2007  . MENOPAUSAL SYNDROME 04/21/2007  . ALLERGIC RHINITIS 04/19/2007  . COLONIC POLYPS, HX OF 04/19/2007   PHYSICAL THERAPY DISCHARGE SUMMARY  Visits from Start of Care: 5  Current functional level related to goals / functional outcomes: See above   Remaining deficits: See above   Education / Equipment: See above  Plan: Patient agrees to discharge.  Patient goals were met. Patient is being discharged due to meeting the stated rehab goals.  ?????      6:44 PM,11/08/16 Elly Modena PT, DPT [facility] [address] [place], [place], [address] Phone: [phone]   Fax:  [phone]  Name: Courtney Nielsen MRN: [id number] Date of Birth: [birth year]

## 2016-11-13 ENCOUNTER — Ambulatory Visit (HOSPITAL_COMMUNITY): Payer: BLUE CROSS/BLUE SHIELD

## 2016-11-15 NOTE — Progress Notes (Signed)
Please place orders in EPIC as patient is being scheduled for a pre-op appointment! Thank you! 

## 2016-11-16 ENCOUNTER — Ambulatory Visit: Payer: Self-pay | Admitting: Orthopedic Surgery

## 2016-11-21 ENCOUNTER — Other Ambulatory Visit (HOSPITAL_COMMUNITY): Payer: Self-pay | Admitting: Emergency Medicine

## 2016-11-21 DIAGNOSIS — M47816 Spondylosis without myelopathy or radiculopathy, lumbar region: Secondary | ICD-10-CM | POA: Diagnosis not present

## 2016-11-21 NOTE — Progress Notes (Signed)
Surgical clearance Plotnikov on chart LOV pre-op exam Plot[facility] 10-31-16 epic

## 2016-11-21 NOTE — Patient Instructions (Signed)
MADDOX BRATCHER  11/21/2016   Your procedure is scheduled on: 12-03-16  Report to [facility] Main  Entrance   Follow signs to Short Stay on first floor at [facility]   Call this number if you have problems the morning of surgery  [phone]   Remember: ONLY 1 PERSON MAY GO WITH YOU TO SHORT STAY TO GET  READY MORNING OF [place].  Do not eat food or drink liquids :After Midnight.     Take these medicines the morning of surgery with A SIP OF WATER: tylenol as needed                                You may not have any metal on your body including hair pins and              piercings  Do not wear jewelry, make-up, lotions, powders or perfumes, deodorant             Do not wear nail polish.  Do not shave  48 hours prior to surgery.     Do not bring valuables to the hospital. [place].  Contacts, dentures or bridgework may not be worn into surgery.  Leave suitcase in the car. After surgery it may be brought to your room.                 Please read over the following fact sheets you were given: _____________________________________________________________________             [facility] - Preparing for Surgery Before surgery, you can play an important role.  Because skin is not sterile, your skin needs to be as free of germs as possible.  You can reduce the number of germs on your skin by washing with CHG (chlorahexidine gluconate) soap before surgery.  CHG is an antiseptic cleaner which kills germs and bonds with the skin to continue killing germs even after washing. Please DO NOT use if you have an allergy to CHG or antibacterial soaps.  If your skin becomes reddened/irritated stop using the CHG and inform your nurse when you arrive at Short Stay. Do not shave (including legs and underarms) for at least 48 hours prior to the first CHG shower.  You may shave your face/neck. Please follow these instructions  carefully:  1.  Shower with CHG Soap the night before surgery and the  morning of Surgery.  2.  If you choose to wash your hair, wash your hair first as usual with your  normal  shampoo.  3.  After you shampoo, rinse your hair and body thoroughly to remove the  shampoo.                           4.  Use CHG as you would any other liquid soap.  You can apply chg directly  to the skin and wash                       Gently with a scrungie or clean washcloth.  5.  Apply the CHG Soap to your body ONLY FROM THE NECK DOWN.   Do not use on face/ open  Wound or open sores. Avoid contact with eyes, ears mouth and genitals (private parts).                       Wash face,  Genitals (private parts) with your normal soap.             6.  Wash thoroughly, paying special attention to the area where your surgery  will be performed.  7.  Thoroughly rinse your body with warm water from the neck down.  8.  DO NOT shower/wash with your normal soap after using and rinsing off  the CHG Soap.                9.  Pat yourself dry with a clean towel.            10.  Wear clean pajamas.            11.  Place clean sheets on your bed the night of your first shower and do not  sleep with pets. Day of Surgery : Do not apply any lotions/deodorants the morning of surgery.  Please wear clean clothes to the hospital/surgery center.  FAILURE TO FOLLOW THESE INSTRUCTIONS MAY RESULT IN THE CANCELLATION OF YOUR SURGERY PATIENT SIGNATURE_________________________________  NURSE SIGNATURE__________________________________  ________________________________________________________________________   [name]  An incentive spirometer is a tool that can help keep your lungs clear and active. This tool measures how well you are filling your lungs with each breath. Taking long deep breaths may help reverse or decrease the chance of developing breathing (pulmonary) problems (especially infection)  following:  A long period of time when you are unable to move or be active. BEFORE THE PROCEDURE   If the spirometer includes an indicator to show your best effort, your nurse or respiratory therapist will set it to a desired goal.  If possible, sit up straight or lean slightly forward. Try not to slouch.  Hold the incentive spirometer in an upright position. INSTRUCTIONS FOR USE  1. Sit on the edge of your bed if possible, or sit up as far as you can in bed or on a chair. 2. Hold the incentive spirometer in an upright position. 3. Breathe out normally. 4. Place the mouthpiece in your mouth and seal your lips tightly around it. 5. Breathe in slowly and as deeply as possible, raising the piston or the ball toward the top of the column. 6. Hold your breath for 3-5 seconds or for as long as possible. Allow the piston or ball to fall to the bottom of the column. 7. Remove the mouthpiece from your mouth and breathe out normally. 8. Rest for a few seconds and repeat Steps 1 through 7 at least 10 times every 1-2 hours when you are awake. Take your time and take a few normal breaths between deep breaths. 9. The spirometer may include an indicator to show your best effort. Use the indicator as a goal to work toward during each repetition. 10. After each set of 10 deep breaths, practice coughing to be sure your lungs are clear. If you have an incision (the cut made at the time of surgery), support your incision when coughing by placing a pillow or rolled up towels firmly against it. Once you are able to get out of bed, walk around indoors and cough well. You may stop using the incentive spirometer when instructed by your caregiver.  RISKS AND COMPLICATIONS  Take your time so you do not get  dizzy or light-headed.  If you are in pain, you may need to take or ask for pain medication before doing incentive spirometry. It is harder to take a deep breath if you are having pain. AFTER USE  Rest and  breathe slowly and easily.  It can be helpful to keep track of a log of your progress. Your caregiver can provide you with a simple table to help with this. If you are using the spirometer at home, follow these instructions: [place] IF:   You are having difficultly using the spirometer.  You have trouble using the spirometer as often as instructed.  Your pain medication is not giving enough relief while using the spirometer.  You develop fever of 100.5 F (38.1 C) or higher. SEEK IMMEDIATE MEDICAL CARE IF:   You cough up bloody sputum that had not been present before.  You develop fever of 102 F (38.9 C) or greater.  You develop worsening pain at or near the incision site. MAKE SURE YOU:   Understand these instructions.  Will watch your condition.  Will get help right away if you are not doing well or get worse. Document Released: 10/29/2006 Document Revised: 09/10/2011 Document Reviewed: 12/30/2006 ExitCare Patient Information 2014 ExitCare, [place].   ________________________________________________________________________  WHAT IS A BLOOD TRANSFUSION? Blood Transfusion Information  A transfusion is the replacement of blood or some of its parts. Blood is made up of multiple cells which provide different functions.  Red blood cells carry oxygen and are used for blood loss replacement.  White blood cells fight against infection.  Platelets control bleeding.  Plasma helps clot blood.  Other blood products are available for specialized needs, such as hemophilia or other clotting disorders. BEFORE THE TRANSFUSION  Who gives blood for transfusions?   Healthy volunteers who are fully evaluated to make sure their blood is safe. This is blood bank blood. Transfusion therapy is the safest it has ever been in the practice of medicine. Before blood is taken from a donor, a complete history is taken to make sure that person has no history of diseases nor engages in  risky social behavior (examples are intravenous drug use or sexual activity with multiple partners). The donor's travel history is screened to minimize risk of transmitting infections, such as malaria. The donated blood is tested for signs of infectious diseases, such as HIV and hepatitis. The blood is then tested to be sure it is compatible with you in order to minimize the chance of a transfusion reaction. If you or a relative donates blood, this is often done in anticipation of surgery and is not appropriate for emergency situations. It takes many days to process the donated blood. RISKS AND COMPLICATIONS Although transfusion therapy is very safe and saves many lives, the main dangers of transfusion include:   Getting an infectious disease.  Developing a transfusion reaction. This is an allergic reaction to something in the blood you were given. Every precaution is taken to prevent this. The decision to have a blood transfusion has been considered carefully by your caregiver before blood is given. Blood is not given unless the benefits outweigh the risks. AFTER THE TRANSFUSION  Right after receiving a blood transfusion, you will usually feel much better and more energetic. This is especially true if your red blood cells have gotten low (anemic). The transfusion raises the level of the red blood cells which carry oxygen, and this usually causes an energy increase.  The nurse administering the transfusion will  monitor you carefully for complications. HOME CARE INSTRUCTIONS  No special instructions are needed after a transfusion. You may find your energy is better. Speak with your caregiver about any limitations on activity for underlying diseases you may have. SEEK MEDICAL CARE IF:   Your condition is not improving after your transfusion.  You develop redness or irritation at the intravenous (IV) site. SEEK IMMEDIATE MEDICAL CARE IF:  Any of the following symptoms occur over the next 12  hours:  Shaking chills.  You have a temperature by mouth above 102 F (38.9 C), not controlled by medicine.  Chest, back, or muscle pain.  People around you feel you are not acting correctly or are confused.  Shortness of breath or difficulty breathing.  Dizziness and fainting.  You get a rash or develop hives.  You have a decrease in urine output.  Your urine turns a dark color or changes to pink, red, or brown. Any of the following symptoms occur over the next 10 days:  You have a temperature by mouth above 102 F (38.9 C), not controlled by medicine.  Shortness of breath.  Weakness after normal activity.  The white part of the eye turns yellow (jaundice).  You have a decrease in the amount of urine or are urinating less often.  Your urine turns a dark color or changes to pink, red, or brown. Document Released: 06/15/2000 Document Revised: 09/10/2011 Document Reviewed: 02/02/2008 [facility] Patient Information 2014 [place], [place].  _______________________________________________________________________

## 2016-11-22 ENCOUNTER — Ambulatory Visit (HOSPITAL_COMMUNITY)
Admission: RE | Admit: 2016-11-22 | Discharge: 2016-11-22 | Disposition: A | Discharge status: Home / Self Care | Payer: BLUE CROSS/BLUE SHIELD | Source: Ambulatory Visit | Attending: Anesthesiology | Admitting: Anesthesiology

## 2016-11-22 ENCOUNTER — Encounter (HOSPITAL_COMMUNITY): Payer: Self-pay

## 2016-11-22 ENCOUNTER — Encounter (HOSPITAL_COMMUNITY)
Admission: RE | Admit: 2016-11-22 | Discharge: 2016-11-22 | Disposition: A | Discharge status: Home / Self Care | Payer: BLUE CROSS/BLUE SHIELD | Source: Ambulatory Visit | Attending: Orthopedic Surgery | Admitting: Orthopedic Surgery

## 2016-11-22 DIAGNOSIS — I10 Essential (primary) hypertension: Secondary | ICD-10-CM | POA: Insufficient documentation

## 2016-11-22 DIAGNOSIS — I517 Cardiomegaly: Secondary | ICD-10-CM | POA: Insufficient documentation

## 2016-11-22 DIAGNOSIS — Z0189 Encounter for other specified special examinations: Secondary | ICD-10-CM

## 2016-11-22 DIAGNOSIS — Z01810 Encounter for preprocedural cardiovascular examination: Secondary | ICD-10-CM | POA: Diagnosis present

## 2016-11-22 DIAGNOSIS — Z01812 Encounter for preprocedural laboratory examination: Secondary | ICD-10-CM | POA: Diagnosis present

## 2016-11-22 DIAGNOSIS — M1711 Unilateral primary osteoarthritis, right knee: Secondary | ICD-10-CM | POA: Diagnosis not present

## 2016-11-22 DIAGNOSIS — Z01818 Encounter for other preprocedural examination: Secondary | ICD-10-CM | POA: Diagnosis present

## 2016-11-22 DIAGNOSIS — J449 Chronic obstructive pulmonary disease, unspecified: Secondary | ICD-10-CM | POA: Diagnosis not present

## 2016-11-22 LAB — COMPREHENSIVE METABOLIC PANEL
ALK PHOS: 75 U/L (ref 38–126)
ALT: 23 U/L (ref 14–54)
AST: 16 U/L (ref 15–41)
Albumin: 3.7 g/dL (ref 3.5–5.0)
Anion gap: 8 (ref 5–15)
BUN: 15 mg/dL (ref 6–20)
CO2: 26 mmol/L (ref 22–32)
CREATININE: 0.95 mg/dL (ref 0.44–1.00)
Calcium: 9 mg/dL (ref 8.9–10.3)
Chloride: 106 mmol/L (ref 101–111)
GFR calc Af Amer: >60 mL/min (ref >60)
GFR calc non Af Amer: >60 mL/min (ref >60)
Glucose, Bld: 98 mg/dL (ref 65–99)
Potassium: 4.3 mmol/L (ref 3.5–5.1)
Sodium: 140 mmol/L (ref 135–145)
TOTAL PROTEIN: 7.1 g/dL (ref 6.5–8.1)
Total Bilirubin: 0.9 mg/dL (ref 0.3–1.2)

## 2016-11-22 LAB — ABO/RH: ABO/RH(D): O POS

## 2016-11-22 LAB — CBC
HEMATOCRIT: 41.5 % (ref 36.0–46.0)
HEMOGLOBIN: 13.9 g/dL (ref 12.0–15.0)
MCH: 31.6 pg (ref 26.0–34.0)
MCHC: 33.5 g/dL (ref 30.0–36.0)
MCV: 94.3 fL (ref 78.0–100.0)
Platelets: 240 10*3/uL (ref 150–400)
RBC: 4.4 MIL/uL (ref 3.87–5.11)
RDW: 13.6 % (ref 11.5–15.5)
WBC: 9.7 10*3/uL (ref 4.0–10.5)

## 2016-11-22 LAB — APTT: aPTT: 30 seconds (ref 24–36)

## 2016-11-22 LAB — SURGICAL PCR SCREEN
MRSA, PCR: NEGATIVE
STAPHYLOCOCCUS AUREUS: NEGATIVE

## 2016-11-22 LAB — TYPE AND SCREEN
ABO/RH(D): O POS
Antibody Screen: NEGATIVE

## 2016-11-22 LAB — PROTIME-INR
INR: 0.96
PROTHROMBIN TIME: 12.8 s (ref 11.4–15.2)

## 2016-11-26 ENCOUNTER — Ambulatory Visit: Payer: Self-pay | Admitting: Orthopedic Surgery

## 2016-11-26 NOTE — H&P (Signed)
Courtney Nielsen DOB: [birth year] Married / Language: English / Race: White Female Date of Admission:  12/03/2016 CC:  Right Knee Pain History of Present Illness The patient is a 61 year old female who comes in for a preoperative History and Physical. The patient is scheduled for a right total knee arthroplasty to be performed by Dr. Dione Nielsen. Aluisio, MD at [facility] on 12-03-2016. The patient is a 61 year old female who presents today for follow up of their knee. The patient is being followed for their right knee pain and osteoarthritis. They are now 8 week(s) out from [place]. Symptoms reported today include: pain and swelling. The patient feels that they are doing poorly and report their pain level to be moderate to severe. The following medication has been used for pain control: none. The patient has not gotten any relief of their symptoms with viscosupplementation (did not seem to offer relief). Courtney Nielsen feels as though her knee has not gotten any better. If anything, it is getting worse with time. The visco injections did not heal. She is unable to do things she desires. She does get swelling. She would like to be more active, but the knee is preventing her from doing so. She also has an arthritic left hip, but currently the right knee is much more symptomatic than the left hip. At this point, the most predictable means of improving pain and function is total knee arthroplasty. She has got advanced arthritic change in that knee with progressive pain and dysfunction. The cortisone and viscosupplement injections have not worked. She is at a stage where she needs to get something done to improve pain and function. The procedure, risks, potential complications and rehab course are discussed in detail and the patient elects to proceed. They have been treated conservatively in the past for the above stated problem and despite conservative measures, they continue to have progressive pain and severe  functional limitations and dysfunction. They have failed non-operative management including home exercise, medications, and injections. It is felt that they would benefit from undergoing total joint replacement. Risks and benefits of the procedure have been discussed with the patient and they elect to proceed with surgery. There are no active contraindications to surgery such as ongoing infection or rapidly progressive neurological disease.  Problem List/Past Medical   Pain of both hip joints (M25.551, M25.552)  Degeneration of cervical intervertebral disc (M50.90)  Primary osteoarthritis of right knee (M17.11)  Primary osteoarthritis of left hip (M16.12)  Irritable bowel syndrome  High blood pressure  Chronic Obstructive Lung Disease  Cataract  Bronchitis  Past History Pneumonia  Past History Hemorrhoids  Diverticulosis  Degenerative Disc Disease  Mumps  Menopause   Allergies Sulfanilamide *CHEMICALS*  Indomethacin *ANALGESICS - ANTI-INFLAMMATORY*  Verapamil HCl *CALCIUM CHANNEL BLOCKERS*  Meloxicam *ANALGESICS - ANTI-INFLAMMATORY*  Dilaudid *ANALGESICS - OPIOID*  Levaquin *FLUOROQUINOLONES*  Ceftin *CEPHALOSPORINS*  Coreg *BETA BLOCKERS*  Furosemide *DIURETICS*   Family History  Cancer  First Degree Relatives. father Osteoporosis  grandmother fathers side Hypertension  mother and father  Social History  Number of flights of stairs before winded  2-3 Most recent primary occupation  Tax inspector Marital status  married Pain Contract  no Tobacco / smoke exposure  no Previously in rehab  no Current work status  working full time Alcohol use  current drinker; only occasionally per week Drug/Alcohol Rehab (Currently)  no Tobacco use  Former smoker. Children  1 Illicit drug use  no Exercise  Exercises weekly; does  other and team sport Living situation  live with spouse  Medication History  Nucynta (75MG  Tablet, Oral)  Active. Acetaminophen (500MG  Tablet, Oral) Active. Xanax (0.5MG  Tablet, Oral) Active. Aspirin (81MG  Tablet, Oral) Active. CeleBREX (200MG  Capsule, Oral) Active. Vitamin D3 (10000UNIT Capsule, Oral) Active. Cyanocobalamin (500MCG Tablet, Oral) Active. Estradiol (0.5MG  Tablet, Oral) Active. Claritin (10MG  Capsule, Oral as needed) Active. Losartan Potassium (100MG  Tablet, Oral) Active. MedroxyPROGESTERone Acetate (2.5MG  Tablet, Oral) Active. Fish Oil Concentrate (1 (one) Oral) Specific strength unknown - Active.   Past Surgical History Colon Polyp Removal - Colonoscopy  Meniscal Repair  Date: 10/2015.   Review of Systems  General Not Present- Chills, Fatigue, Fever, Memory Loss, Night Sweats, Weight Gain and Weight Loss. Skin Not Present- Eczema, Hives, Itching, Lesions and Rash. HEENT Not Present- Dentures, Double Vision, Headache, Hearing Loss, Tinnitus and Visual Loss. Respiratory Not Present- Allergies, Chronic Cough, Coughing up blood, Shortness of breath at rest and Shortness of breath with exertion. Cardiovascular Not Present- Chest Pain, Difficulty Breathing Lying Down, Murmur, Palpitations, Racing/skipping heartbeats and Swelling. Gastrointestinal Not Present- Abdominal Pain, Bloody Stool, Constipation, Diarrhea, Difficulty Swallowing, Heartburn, Jaundice, Loss of appetitie, Nausea and Vomiting. Female Genitourinary Not Present- Blood in Urine, Discharge, Flank Pain, Incontinence, Painful Urination, Urgency, Urinary frequency, Urinary Retention, Urinating at Night and Weak urinary stream. Musculoskeletal Present- Back Pain. Not Present- Joint Pain, Joint Swelling, Morning Stiffness, Muscle Pain, Muscle Weakness and Spasms. Neurological Not Present- Blackout spells, Difficulty with balance, Dizziness, Paralysis, Tremor and Weakness. Psychiatric Not Present- Insomnia.  Vitals  Pulse: 76 (Regular)  BP: 132/88 (Sitting, Left Arm, Standard)     Physical  Exam General Mental Status -Alert, cooperative and good historian. General Appearance-pleasant, Not in acute distress. Orientation-Oriented X3. Build & Nutrition-Well nourished and Well developed.  Head and Neck Head-normocephalic, atraumatic . Neck Global Assessment - supple, no bruit auscultated on the right, no bruit auscultated on the left.  Eye Vision-Wears corrective lenses. Pupil - Bilateral-Regular and Round. Motion - Bilateral-EOMI.  ENMT Note: partial upper and partial lower denture plates   Chest and Lung Exam Auscultation Breath sounds - clear at anterior chest wall and clear at posterior chest wall. Adventitious sounds - No Adventitious sounds.  Cardiovascular Auscultation Rhythm - Regular rate and rhythm. Heart Sounds - S1 WNL and S2 WNL. Murmurs & Other Heart Sounds - Auscultation of the heart reveals - No Murmurs.  Abdomen Palpation/Percussion Tenderness - Abdomen is non-tender to palpation. Rigidity (guarding) - Abdomen is soft. Auscultation Auscultation of the abdomen reveals - Bowel sounds normal.  Female Genitourinary Note: Not done, not pertinent to present illness   Musculoskeletal Note: On exam, well-developed female, in no distress. Her right knee shows trace effusion. Range about 5 to120. She does not have any tenderness or any instability noted.  Assessment & Plan  Primary osteoarthritis of left hip (M16.12) Primary osteoarthritis of right knee (M17.11)  Note:Surgical Plans: Right Total Knee Replacement  Disposition: Home with husband and daughter, Start with HHPT  PCP: Dr. Alain Marion - Patient has been seen preoperatively and felt to be stable for surgery.  IV TXA  Anesthesia Issues: None  Patient was instructed on what medications to stop prior to surgery.  Signed electronically by Joelene Millin, III PA-C

## 2016-12-02 MED ORDER — VANCOMYCIN HCL 10 G IV SOLR
1500.0000 mg | INTRAVENOUS | Status: AC
  Administered 2016-12-03: 1500 mg via INTRAVENOUS
  Filled 2016-12-02: qty 1500

## 2016-12-02 NOTE — Anesthesia Preprocedure Evaluation (Addendum)
Anesthesia Evaluation  Patient identified by MRN, date of birth, ID band Patient awake    Reviewed: Allergy & Precautions, H&P , NPO status , Patient's Chart, lab work & pertinent test results  Airway Mallampati: II  TM Distance: >3 FB Neck ROM: Full    Dental no notable dental hx. (+) Teeth Intact, Dental Advisory Given   Pulmonary neg pulmonary ROS, former smoker,    Pulmonary exam normal breath sounds clear to auscultation       Cardiovascular Exercise Tolerance: Good hypertension, Pt. on medications  Rhythm:Regular Rate:Normal     Neuro/Psych negative neurological ROS  negative psych ROS   GI/Hepatic negative GI ROS, Neg liver ROS,   Endo/Other  negative endocrine ROS  Renal/GU negative Renal ROS  negative genitourinary   Musculoskeletal  (+) Arthritis , Osteoarthritis,    Abdominal   Peds  Hematology negative hematology ROS (+)   Anesthesia Other Findings   Reproductive/Obstetrics negative OB ROS                            Anesthesia Physical Anesthesia Plan  ASA: II  Anesthesia Plan: Spinal   Post-op Pain Management:  Regional for Post-op pain   Induction: Intravenous  Airway Management Planned: Simple Face Mask  Additional Equipment:   Intra-op Plan:   Post-operative Plan:   Informed Consent: I have reviewed the patients History and Physical, chart, labs and discussed the procedure including the risks, benefits and alternatives for the proposed anesthesia with the patient or authorized representative who has indicated his/her understanding and acceptance.   Dental advisory given  Plan Discussed with: CRNA  Anesthesia Plan Comments:         Anesthesia Quick Evaluation

## 2016-12-03 ENCOUNTER — Observation Stay (HOSPITAL_COMMUNITY)
Admission: RE | Admit: 2016-12-03 | Discharge: 2016-12-04 | Disposition: A | Discharge status: Home / Self Care | Payer: BLUE CROSS/BLUE SHIELD | Source: Ambulatory Visit | Attending: Orthopedic Surgery | Admitting: Orthopedic Surgery

## 2016-12-03 ENCOUNTER — Encounter (HOSPITAL_COMMUNITY)
Admission: RE | Disposition: A | Discharge status: Home / Self Care | Payer: Self-pay | Source: Ambulatory Visit | Attending: Orthopedic Surgery

## 2016-12-03 ENCOUNTER — Inpatient Hospital Stay (HOSPITAL_COMMUNITY): Payer: BLUE CROSS/BLUE SHIELD | Admitting: Anesthesiology

## 2016-12-03 ENCOUNTER — Encounter (HOSPITAL_COMMUNITY): Payer: Self-pay | Admitting: *Deleted

## 2016-12-03 DIAGNOSIS — I10 Essential (primary) hypertension: Secondary | ICD-10-CM | POA: Insufficient documentation

## 2016-12-03 DIAGNOSIS — Z7982 Long term (current) use of aspirin: Secondary | ICD-10-CM | POA: Insufficient documentation

## 2016-12-03 DIAGNOSIS — M179 Osteoarthritis of knee, unspecified: Secondary | ICD-10-CM | POA: Diagnosis present

## 2016-12-03 DIAGNOSIS — Z87891 Personal history of nicotine dependence: Secondary | ICD-10-CM | POA: Insufficient documentation

## 2016-12-03 DIAGNOSIS — Z882 Allergy status to sulfonamides status: Secondary | ICD-10-CM | POA: Diagnosis not present

## 2016-12-03 DIAGNOSIS — G8918 Other acute postprocedural pain: Secondary | ICD-10-CM | POA: Diagnosis not present

## 2016-12-03 DIAGNOSIS — M1711 Unilateral primary osteoarthritis, right knee: Secondary | ICD-10-CM | POA: Diagnosis not present

## 2016-12-03 DIAGNOSIS — J449 Chronic obstructive pulmonary disease, unspecified: Secondary | ICD-10-CM | POA: Diagnosis not present

## 2016-12-03 DIAGNOSIS — Z79899 Other long term (current) drug therapy: Secondary | ICD-10-CM | POA: Diagnosis not present

## 2016-12-03 DIAGNOSIS — M1710 Unilateral primary osteoarthritis, unspecified knee: Secondary | ICD-10-CM | POA: Diagnosis present

## 2016-12-03 DIAGNOSIS — E538 Deficiency of other specified B group vitamins: Secondary | ICD-10-CM | POA: Insufficient documentation

## 2016-12-03 HISTORY — PX: TOTAL KNEE ARTHROPLASTY: SHX125

## 2016-12-03 SURGERY — ARTHROPLASTY, KNEE, TOTAL
Anesthesia: Spinal | Site: Knee | Laterality: Right

## 2016-12-03 MED ORDER — ACETAMINOPHEN 650 MG RE SUPP: 650.0000 mg | Freq: Four times a day (QID) | RECTAL | Status: DC | PRN

## 2016-12-03 MED ORDER — DEXAMETHASONE SODIUM PHOSPHATE 10 MG/ML IJ SOLN
10.0000 mg | Freq: Once | INTRAMUSCULAR | Status: AC
  Administered 2016-12-03: 10 mg via INTRAVENOUS

## 2016-12-03 MED ORDER — GABAPENTIN 300 MG PO CAPS
300.0000 mg | ORAL_CAPSULE | Freq: Once | ORAL | Status: AC
  Administered 2016-12-03: 300 mg via ORAL

## 2016-12-03 MED ORDER — LORATADINE 10 MG PO TABS
10.0000 mg | ORAL_TABLET | Freq: Every day | ORAL | Status: DC
  Administered 2016-12-03: 10 mg via ORAL
  Filled 2016-12-03: qty 1

## 2016-12-03 MED ORDER — ONDANSETRON HCL 4 MG/2ML IJ SOLN
4.0000 mg | Freq: Four times a day (QID) | INTRAMUSCULAR | Status: DC | PRN
  Administered 2016-12-04: 4 mg via INTRAVENOUS
  Filled 2016-12-03: qty 2

## 2016-12-03 MED ORDER — LIDOCAINE 2% (20 MG/ML) 5 ML SYRINGE
INTRAMUSCULAR | Status: DC | PRN
  Administered 2016-12-03: 100 mg via INTRAVENOUS

## 2016-12-03 MED ORDER — ALPRAZOLAM 0.5 MG PO TABS
0.5000 mg | ORAL_TABLET | Freq: Every day | ORAL | Status: DC
  Administered 2016-12-03: 0.5 mg via ORAL
  Filled 2016-12-03: qty 1

## 2016-12-03 MED ORDER — ACETAMINOPHEN 10 MG/ML IV SOLN
1000.0000 mg | Freq: Once | INTRAVENOUS | Status: AC
  Administered 2016-12-03: 1000 mg via INTRAVENOUS

## 2016-12-03 MED ORDER — LACTATED RINGERS IV SOLN
INTRAVENOUS | Status: DC
  Administered 2016-12-03 (×2): via INTRAVENOUS

## 2016-12-03 MED ORDER — TRAMADOL HCL 50 MG PO TABS: 50.0000 mg | ORAL_TABLET | Freq: Four times a day (QID) | ORAL | Status: DC | PRN

## 2016-12-03 MED ORDER — OXYCODONE HCL 5 MG PO TABS
5.0000 mg | ORAL_TABLET | ORAL | Status: DC | PRN
  Administered 2016-12-03 (×2): 10 mg via ORAL
  Administered 2016-12-03 – 2016-12-04 (×2): 5 mg via ORAL
  Administered 2016-12-04 (×3): 10 mg via ORAL
  Filled 2016-12-03: qty 2
  Filled 2016-12-03: qty 1
  Filled 2016-12-03 (×6): qty 2

## 2016-12-03 MED ORDER — METHOCARBAMOL 500 MG PO TABS
500.0000 mg | ORAL_TABLET | Freq: Four times a day (QID) | ORAL | Status: DC | PRN
  Administered 2016-12-03 – 2016-12-04 (×2): 500 mg via ORAL
  Filled 2016-12-03 (×3): qty 1

## 2016-12-03 MED ORDER — PROPOFOL 10 MG/ML IV BOLUS
INTRAVENOUS | Status: DC | PRN
Start: 2016-12-03 — End: 2016-12-03
  Administered 2016-12-03: 30 mg via INTRAVENOUS

## 2016-12-03 MED ORDER — SODIUM CHLORIDE 0.9 % IJ SOLN
INTRAMUSCULAR | Status: DC | PRN
  Administered 2016-12-03: 60 mL

## 2016-12-03 MED ORDER — MENTHOL 3 MG MT LOZG: 1.0000 | LOZENGE | OROMUCOSAL | Status: DC | PRN

## 2016-12-03 MED ORDER — BUPIVACAINE LIPOSOME 1.3 % IJ SUSP
20.0000 mL | Freq: Once | INTRAMUSCULAR | Status: DC
  Filled 2016-12-03: qty 20

## 2016-12-03 MED ORDER — MIDAZOLAM HCL 5 MG/5ML IJ SOLN
INTRAMUSCULAR | Status: DC | PRN
  Administered 2016-12-03: 2 mg via INTRAVENOUS

## 2016-12-03 MED ORDER — DOCUSATE SODIUM 100 MG PO CAPS
100.0000 mg | ORAL_CAPSULE | Freq: Two times a day (BID) | ORAL | Status: DC
  Administered 2016-12-03 – 2016-12-04 (×2): 100 mg via ORAL
  Filled 2016-12-03 (×2): qty 1

## 2016-12-03 MED ORDER — VANCOMYCIN HCL IN DEXTROSE 1-5 GM/200ML-% IV SOLN
1000.0000 mg | Freq: Two times a day (BID) | INTRAVENOUS | Status: AC
  Administered 2016-12-03: 1000 mg via INTRAVENOUS
  Filled 2016-12-03: qty 200

## 2016-12-03 MED ORDER — SODIUM CHLORIDE 0.9 % IJ SOLN
INTRAMUSCULAR | Status: AC
  Filled 2016-12-03: qty 10

## 2016-12-03 MED ORDER — LOSARTAN POTASSIUM 50 MG PO TABS
100.0000 mg | ORAL_TABLET | Freq: Every day | ORAL | Status: DC
  Administered 2016-12-03: 100 mg via ORAL
  Filled 2016-12-03: qty 2

## 2016-12-03 MED ORDER — ONDANSETRON HCL 4 MG PO TABS: 4.0000 mg | ORAL_TABLET | Freq: Four times a day (QID) | ORAL | Status: DC | PRN

## 2016-12-03 MED ORDER — BISACODYL 10 MG RE SUPP: 10.0000 mg | Freq: Every day | RECTAL | Status: DC | PRN

## 2016-12-03 MED ORDER — BUPIVACAINE HCL (PF) 0.25 % IJ SOLN
INTRAMUSCULAR | Status: AC
  Filled 2016-12-03: qty 30

## 2016-12-03 MED ORDER — TRANEXAMIC ACID 1000 MG/10ML IV SOLN
1000.0000 mg | INTRAVENOUS | Status: AC
  Administered 2016-12-03: 1000 mg via INTRAVENOUS
  Filled 2016-12-03: qty 1100

## 2016-12-03 MED ORDER — FENTANYL CITRATE (PF) 100 MCG/2ML IJ SOLN
INTRAMUSCULAR | Status: DC | PRN
  Administered 2016-12-03: 50 ug via INTRAVENOUS
  Administered 2016-12-03 (×2): 25 ug via INTRAVENOUS

## 2016-12-03 MED ORDER — ACETAMINOPHEN 325 MG PO TABS: 650.0000 mg | ORAL_TABLET | Freq: Four times a day (QID) | ORAL | Status: DC | PRN

## 2016-12-03 MED ORDER — GABAPENTIN 300 MG PO CAPS
ORAL_CAPSULE | ORAL | Status: AC
  Filled 2016-12-03: qty 1

## 2016-12-03 MED ORDER — EPHEDRINE SULFATE-NACL 50-0.9 MG/10ML-% IV SOSY
PREFILLED_SYRINGE | INTRAVENOUS | Status: DC | PRN
  Administered 2016-12-03 (×2): 5 mg via INTRAVENOUS

## 2016-12-03 MED ORDER — BUPIVACAINE IN DEXTROSE 0.75-8.25 % IT SOLN
INTRATHECAL | Status: DC | PRN
  Administered 2016-12-03: 15 mg via INTRATHECAL

## 2016-12-03 MED ORDER — SODIUM CHLORIDE 0.9 % IJ SOLN
INTRAMUSCULAR | Status: AC
  Filled 2016-12-03: qty 50

## 2016-12-03 MED ORDER — PROPOFOL 10 MG/ML IV BOLUS
INTRAVENOUS | Status: AC
  Filled 2016-12-03: qty 60

## 2016-12-03 MED ORDER — ONDANSETRON HCL 4 MG/2ML IJ SOLN
INTRAMUSCULAR | Status: DC | PRN
  Administered 2016-12-03: 4 mg via INTRAVENOUS

## 2016-12-03 MED ORDER — RIVAROXABAN 10 MG PO TABS
10.0000 mg | ORAL_TABLET | Freq: Every day | ORAL | Status: DC
  Administered 2016-12-04: 10 mg via ORAL
  Filled 2016-12-03: qty 1

## 2016-12-03 MED ORDER — BUPIVACAINE LIPOSOME 1.3 % IJ SUSP
INTRAMUSCULAR | Status: DC | PRN
  Administered 2016-12-03: 20 mL

## 2016-12-03 MED ORDER — METOCLOPRAMIDE HCL 5 MG PO TABS: 5.0000 mg | ORAL_TABLET | Freq: Three times a day (TID) | ORAL | Status: DC | PRN

## 2016-12-03 MED ORDER — ACETAMINOPHEN 10 MG/ML IV SOLN
INTRAVENOUS | Status: AC
  Filled 2016-12-03: qty 100

## 2016-12-03 MED ORDER — SODIUM CHLORIDE 0.9 % IV SOLN
INTRAVENOUS | Status: DC
  Administered 2016-12-03 – 2016-12-04 (×2): via INTRAVENOUS

## 2016-12-03 MED ORDER — ONDANSETRON HCL 4 MG/2ML IJ SOLN
INTRAMUSCULAR | Status: AC
  Filled 2016-12-03: qty 2

## 2016-12-03 MED ORDER — POLYETHYLENE GLYCOL 3350 17 G PO PACK: 17.0000 g | PACK | Freq: Every day | ORAL | Status: DC | PRN

## 2016-12-03 MED ORDER — PHENOL 1.4 % MT LIQD: 1.0000 | OROMUCOSAL | Status: DC | PRN

## 2016-12-03 MED ORDER — MIDAZOLAM HCL 2 MG/2ML IJ SOLN
INTRAMUSCULAR | Status: AC
  Filled 2016-12-03: qty 2

## 2016-12-03 MED ORDER — CHLORHEXIDINE GLUCONATE 4 % EX LIQD: 60.0000 mL | Freq: Once | CUTANEOUS | Status: DC

## 2016-12-03 MED ORDER — LIDOCAINE 2% (20 MG/ML) 5 ML SYRINGE
INTRAMUSCULAR | Status: AC
  Filled 2016-12-03: qty 5

## 2016-12-03 MED ORDER — FLEET ENEMA 7-19 GM/118ML RE ENEM: 1.0000 | ENEMA | Freq: Once | RECTAL | Status: DC | PRN

## 2016-12-03 MED ORDER — ACETAMINOPHEN 500 MG PO TABS
1000.0000 mg | ORAL_TABLET | Freq: Four times a day (QID) | ORAL | Status: AC
  Administered 2016-12-03 – 2016-12-04 (×4): 1000 mg via ORAL
  Filled 2016-12-03 (×5): qty 2

## 2016-12-03 MED ORDER — SODIUM CHLORIDE 0.9 % IR SOLN
Status: DC | PRN
  Administered 2016-12-03: 1000 mL

## 2016-12-03 MED ORDER — FENTANYL CITRATE (PF) 100 MCG/2ML IJ SOLN
INTRAMUSCULAR | Status: AC
  Filled 2016-12-03: qty 2

## 2016-12-03 MED ORDER — MORPHINE SULFATE (PF) 4 MG/ML IV SOLN: 1.0000 mg | INTRAVENOUS | Status: DC | PRN

## 2016-12-03 MED ORDER — METOCLOPRAMIDE HCL 5 MG/ML IJ SOLN
5.0000 mg | Freq: Three times a day (TID) | INTRAMUSCULAR | Status: DC | PRN
  Administered 2016-12-04: 10 mg via INTRAVENOUS
  Filled 2016-12-03: qty 2

## 2016-12-03 MED ORDER — PROPOFOL 500 MG/50ML IV EMUL
INTRAVENOUS | Status: DC | PRN
  Administered 2016-12-03: 75 ug/kg/min via INTRAVENOUS

## 2016-12-03 MED ORDER — DEXAMETHASONE SODIUM PHOSPHATE 10 MG/ML IJ SOLN
INTRAMUSCULAR | Status: AC
  Filled 2016-12-03: qty 1

## 2016-12-03 MED ORDER — DEXTROSE 5 % IV SOLN
500.0000 mg | Freq: Four times a day (QID) | INTRAVENOUS | Status: DC | PRN
  Administered 2016-12-03: 500 mg via INTRAVENOUS
  Filled 2016-12-03: qty 550
  Filled 2016-12-03: qty 5

## 2016-12-03 MED ORDER — ROPIVACAINE HCL 5 MG/ML IJ SOLN
INTRAMUSCULAR | Status: DC | PRN
  Administered 2016-12-03: 25 mL via PERINEURAL

## 2016-12-03 MED ORDER — DIPHENHYDRAMINE HCL 12.5 MG/5ML PO ELIX: 12.5000 mg | ORAL_SOLUTION | ORAL | Status: DC | PRN

## 2016-12-03 MED ORDER — MORPHINE SULFATE (PF) 2 MG/ML IV SOLN
1.0000 mg | INTRAVENOUS | Status: DC | PRN
  Administered 2016-12-03: 1 mg via INTRAVENOUS
  Filled 2016-12-03: qty 1

## 2016-12-03 MED ORDER — TRANEXAMIC ACID 1000 MG/10ML IV SOLN
1000.0000 mg | Freq: Once | INTRAVENOUS | Status: AC
  Administered 2016-12-03: 1000 mg via INTRAVENOUS
  Filled 2016-12-03: qty 1100

## 2016-12-03 SURGICAL SUPPLY — 49 items
BAG DECANTER FOR FLEXI CONT (MISCELLANEOUS) ×2 IMPLANT
BAG SPEC THK2 15X12 ZIP CLS (MISCELLANEOUS) ×1
BAG ZIPLOCK 12X15 (MISCELLANEOUS) ×2 IMPLANT
BANDAGE ACE 6X5 VEL STRL LF (GAUZE/BANDAGES/DRESSINGS) ×2 IMPLANT
BLADE SAG 18X100X1.27 (BLADE) ×2 IMPLANT
BLADE SAW SGTL 11.0X1.19X90.0M (BLADE) ×2 IMPLANT
BOWL SMART MIX CTS (DISPOSABLE) ×2 IMPLANT
CAPT KNEE TOTAL 3 ATTUNE ×1 IMPLANT
CEMENT HV SMART SET (Cement) ×4 IMPLANT
COVER SURGICAL LIGHT HANDLE (MISCELLANEOUS) ×2 IMPLANT
CUFF TOURN SGL QUICK 34 (TOURNIQUET CUFF) ×2
CUFF TRNQT CYL 34X4X40X1 (TOURNIQUET CUFF) ×1 IMPLANT
DECANTER SPIKE VIAL GLASS SM (MISCELLANEOUS) ×2 IMPLANT
DRAPE U-SHAPE 47X51 STRL (DRAPES) ×2 IMPLANT
DRSG ADAPTIC 3X8 NADH LF (GAUZE/BANDAGES/DRESSINGS) ×2 IMPLANT
DRSG PAD ABDOMINAL 8X10 ST (GAUZE/BANDAGES/DRESSINGS) ×2 IMPLANT
DURAPREP 26ML APPLICATOR (WOUND CARE) ×2 IMPLANT
ELECT REM PT RETURN 15FT ADLT (MISCELLANEOUS) ×2 IMPLANT
EVACUATOR 1/8 PVC DRAIN (DRAIN) ×2 IMPLANT
GAUZE SPONGE 4X4 12PLY STRL (GAUZE/BANDAGES/DRESSINGS) ×2 IMPLANT
GLOVE BIO SURGEON STRL SZ7.5 (GLOVE) IMPLANT
GLOVE BIO SURGEON STRL SZ8 (GLOVE) ×2 IMPLANT
GLOVE BIOGEL PI IND STRL 6.5 (GLOVE) IMPLANT
GLOVE BIOGEL PI IND STRL 8 (GLOVE) ×1 IMPLANT
GLOVE BIOGEL PI INDICATOR 6.5 (GLOVE)
GLOVE BIOGEL PI INDICATOR 8 (GLOVE) ×1
GLOVE SURG SS PI 6.5 STRL IVOR (GLOVE) IMPLANT
GOWN STRL REUS W/TWL LRG LVL3 (GOWN DISPOSABLE) ×2 IMPLANT
GOWN STRL REUS W/TWL XL LVL3 (GOWN DISPOSABLE) IMPLANT
HANDPIECE INTERPULSE COAX TIP (DISPOSABLE) ×2
IMMOBILIZER KNEE 20 (SOFTGOODS) ×2
IMMOBILIZER KNEE 20 THIGH 36 (SOFTGOODS) ×1 IMPLANT
MANIFOLD NEPTUNE II (INSTRUMENTS) ×2 IMPLANT
NS IRRIG 1000ML POUR BTL (IV SOLUTION) ×2 IMPLANT
PACK TOTAL KNEE CUSTOM (KITS) ×2 IMPLANT
PADDING CAST COTTON 6X4 STRL (CAST SUPPLIES) ×5 IMPLANT
POSITIONER SURGICAL ARM (MISCELLANEOUS) ×2 IMPLANT
SET HNDPC FAN SPRY TIP SCT (DISPOSABLE) ×1 IMPLANT
STRIP CLOSURE SKIN 1/2X4 (GAUZE/BANDAGES/DRESSINGS) ×3 IMPLANT
SUT MNCRL AB 4-0 PS2 18 (SUTURE) ×2 IMPLANT
SUT STRATAFIX 0 PDS 27 VIOLET (SUTURE) ×2
SUT VIC AB 2-0 CT1 27 (SUTURE) ×6
SUT VIC AB 2-0 CT1 TAPERPNT 27 (SUTURE) ×3 IMPLANT
SUTURE STRATFX 0 PDS 27 VIOLET (SUTURE) ×1 IMPLANT
SYR 50ML LL SCALE MARK (SYRINGE) IMPLANT
TRAY FOLEY W/METER SILVER 16FR (SET/KITS/TRAYS/PACK) ×2 IMPLANT
WATER STERILE IRR 1000ML POUR (IV SOLUTION) ×4 IMPLANT
WRAP KNEE MAXI GEL POST OP (GAUZE/BANDAGES/DRESSINGS) ×2 IMPLANT
YANKAUER SUCT BULB TIP 10FT TU (MISCELLANEOUS) ×2 IMPLANT

## 2016-12-03 NOTE — Evaluation (Signed)
Physical Therapy Evaluation Patient Details Name: Courtney Nielsen MRN: [id number] DOB: [birth year] Today's Date: 12/03/2016   History of Present Illness  R TKA  Clinical Impression  Pt is s/p TKA resulting in the deficits listed below (see PT Problem List). Pt ambulated 75' with RW, performed R TKA exercises with min A. Excellent progress expected. Pt will benefit from skilled PT to increase their independence and safety with mobility to allow discharge to the venue listed below.      Follow Up Recommendations HHPT;Supervision for mobility/OOB    Equipment Recommendations  None recommended by PT    Recommendations for Other Services       Precautions / Restrictions Precautions Precautions: Knee Precaution Comments: reviewed no pillow under R knee Required Braces or Orthoses: Knee Immobilizer - Right Knee Immobilizer - Right: Discontinue once straight leg raise with < 10 degree lag Restrictions Weight Bearing Restrictions: No Other Position/Activity Restrictions: WBAT      Mobility  Bed Mobility Overal bed mobility: Needs Assistance Bed Mobility: Supine to Sit     Supine to sit: HOB elevated;Supervision     General bed mobility comments: no physical assist, HOB up 25*  Transfers Overall transfer level: Needs assistance Equipment used: Rolling walker (2 wheeled) Transfers: Sit to/from Stand Sit to Stand: Min assist         General transfer comment: VCs hand placement, min A to rise  Ambulation/Gait Ambulation/Gait assistance: Min guard Ambulation Distance (Feet): 40 Feet Assistive device: Rolling walker (2 wheeled) Gait Pattern/deviations: Step-to pattern;Decreased step length - right;Decreased step length - left     General Gait Details: VCs sequencing, no LOB, 3/10 pain R knee  Stairs            Wheelchair Mobility    Modified Rankin (Stroke Patients Only)       Balance Overall balance assessment: Modified Independent                                           Pertinent Vitals/Pain Pain Assessment: 0-10 Pain Score: 3  Pain Location: R knee Pain Intervention(s): Limited activity within patient's tolerance;Monitored during session;Premedicated before session;Ice applied    Home Living Family/patient expects to be discharged to:: Private residence Living Arrangements: Spouse/significant other Available Help at Discharge: Family;Available 24 hours/day Type of Home: House Home Access: Stairs to enter   [employer] of Steps: 1 Home Layout: One level Home Equipment: Walker - 2 wheels;Toilet riser;Shower seat      Prior Function Level of Independence: Independent               Hand Dominance        Extremity/Trunk Assessment   Upper Extremity Assessment Upper Extremity Assessment: Overall WFL for tasks assessed    Lower Extremity Assessment Lower Extremity Assessment: RLE deficits/detail RLE Deficits / Details: SLR 3/5, knee AAROM 5-50*, ankle WNL    Cervical / Trunk Assessment Cervical / Trunk Assessment: Normal  Communication   Communication: No difficulties  Cognition Arousal/Alertness: Awake/alert Behavior During Therapy: WFL for tasks assessed/performed Overall Cognitive Status: Within Functional Limits for tasks assessed                                        General Comments      Exercises Total Joint Exercises Ankle  Circles/Pumps: AROM;Both;10 reps;Supine Quad Sets: AROM;Both;10 reps;Supine Heel Slides: AAROM;Right;10 reps;Supine   Assessment/Plan    PT Assessment Patient needs continued PT services  PT Problem List Decreased strength;Decreased range of motion;Decreased activity tolerance;Decreased balance;Decreased knowledge of use of DME;Pain;Decreased mobility       PT Treatment Interventions DME instruction;Gait training;Stair training;Functional mobility training;Neuromuscular re-education;Balance training;Therapeutic  exercise;Therapeutic activities;Patient/family education    PT Goals (Current goals can be found in the Care Plan section)  Acute Rehab PT Goals Patient Stated Goal: bowling, walking PT Goal Formulation: With patient/family Time For Goal Achievement: 12/10/16 Potential to Achieve Goals: Good    Frequency 7X/week   Barriers to discharge        Co-evaluation               AM-PAC PT "6 Clicks" Daily Activity  Outcome Measure Difficulty turning over in bed (including adjusting bedclothes, sheets and blankets)?: A Little Difficulty moving from lying on back to sitting on the side of the bed? : A Little Difficulty sitting down on and standing up from a chair with arms (e.g., wheelchair, bedside commode, etc,.)?: A Little Help needed moving to and from a bed to chair (including a wheelchair)?: A Little Help needed walking in hospital room?: A Little Help needed climbing 3-5 steps with a railing? : A Lot 6 Click Score: 17    End of Session Equipment Utilized During Treatment: Gait belt Activity Tolerance: Patient tolerated treatment well Patient left: in chair;with call bell/phone within reach;with family/visitor present Nurse Communication: Mobility status PT Visit Diagnosis: Pain;Difficulty in walking, not elsewhere classified (R26.2);Muscle weakness (generalized) (M62.81) Pain - Right/Left: Right Pain - part of body: Knee    Time: 8891-6945 PT Time Calculation (min) (ACUTE ONLY): 33 min   Charges:   PT Evaluation $PT Eval Low Complexity: 1 Procedure PT Treatments $Gait Training: 8-22 mins   PT G Codes:          Blondell Reveal Kistler 12/03/2016, 2:25 PM [phone]

## 2016-12-03 NOTE — Op Note (Signed)
OPERATIVE REPORT-TOTAL KNEE ARTHROPLASTY   Pre-operative diagnosis- Osteoarthritis  Right knee(s)  Post-operative diagnosis- Osteoarthritis Right knee(s)  Procedure-  Right  Total Knee Arthroplasty (Depuy Attune)  Surgeon- Dione Plover. Laekyn Rayos, MD  Assistant- Ardeen Jourdain, PA-C   Anesthesia-  Adductor canal block and spinal  EBL-* No blood loss amount entered *   Drains Hemovac  Tourniquet time-  Total Tourniquet Time Documented: Thigh (Right) - 34 minutes Total: Thigh (Right) - 34 minutes     Complications- None  Condition-PACU - hemodynamically stable.   Brief Clinical Note  Courtney Nielsen is a 61 y.o. year old female with end stage OA of her right knee with progressively worsening pain and dysfunction. She has constant pain, with activity and at rest and significant functional deficits with difficulties even with ADLs. She has had extensive non-op management including analgesics, injections of cortisone and viscosupplements, and home exercise program, but remains in significant pain with significant dysfunction.Radiographs show bone on bone arthritis lateral and patellofemoral. She presents now for right Total Knee Arthroplasty.    Procedure in detail---   The patient is brought into the operating room and positioned supine on the operating table. After successful administration of  Adductor canal block and spinal   a tourniquet is placed high on the  Right thigh(s) and the lower extremity is prepped and draped in the usual sterile fashion. Time out is performed by the operating team and then the  Right lower extremity is wrapped in Esmarch, knee flexed and the tourniquet inflated to 300 mmHg.       A midline incision is made with a ten blade through the subcutaneous tissue to the level of the extensor mechanism. A fresh blade is used to make a medial parapatellar arthrotomy. Soft tissue over the proximal medial tibia is subperiosteally elevated to the joint line with a  knife and into the semimembranosus bursa with a Cobb elevator. Soft tissue over the proximal lateral tibia is elevated with attention being paid to avoiding the patellar tendon on the tibial tubercle. The patella is everted, knee flexed 90 degrees and the ACL and PCL are removed. Findings are bone on bone lateral and patellofemoral with large global osteophytes.        The drill is used to create a starting hole in the distal femur and the canal is thoroughly irrigated with sterile saline to remove the fatty contents. The 5 degree Right  valgus alignment guide is placed into the femoral canal and the distal femoral cutting block is pinned to remove 9 mm off the distal femur. Resection is made with an oscillating saw.      The tibia is subluxed forward and the menisci are removed. The extramedullary alignment guide is placed referencing proximally at the medial aspect of the tibial tubercle and distally along the second metatarsal axis and tibial crest. The block is pinned to remove 59mm off the more deficient lateral  side. Resection is made with an oscillating saw. Size 6is the most appropriate size for the tibia and the proximal tibia is prepared with the modular drill and keel punch for that size.      The femoral sizing guide is placed and size 6 is most appropriate. Rotation is marked off the epicondylar axis and confirmed by creating a rectangular flexion gap at 90 degrees. The size 6 cutting block is pinned in this rotation and the anterior, posterior and chamfer cuts are made with the oscillating saw. The intercondylar block is then placed  and that cut is made.      Trial size 6 tibial component, trial size 6 posterior stabilized femur and a 8  mm posterior stabilized rotating platform insert trial is placed. Full extension is achieved with excellent varus/valgus and anterior/posterior balance throughout full range of motion. The patella is everted and thickness measured to be 24  mm. Free hand resection  is taken to 14 mm, a 38 template is placed, lug holes are drilled, trial patella is placed, and it tracks normally. Osteophytes are removed off the posterior femur with the trial in place. All trials are removed and the cut bone surfaces prepared with pulsatile lavage. Cement is mixed and once ready for implantation, the size 6 tibial implant, size  6 posterior stabilized femoral component, and the size 38 patella are cemented in place and the patella is held with the clamp. The trial insert is placed and the knee held in full extension. The Exparel (20 ml mixed with 30 ml saline) is injected into the extensor mechanism, posterior capsule, medial and lateral gutters and subcutaneous tissues.  All extruded cement is removed and once the cement is hard the permanent 8 mm posterior stabilized rotating platform insert is placed into the tibial tray.      The wound is copiously irrigated with saline solution and the extensor mechanism closed over a hemovac drain with #1 V-loc suture. The tourniquet is released for a total tourniquet time of 34  minutes. Flexion against gravity is 140 degrees and the patella tracks normally. Subcutaneous tissue is closed with 2.0 vicryl and subcuticular with running 4.0 Monocryl. The incision is cleaned and dried and steri-strips and a bulky sterile dressing are applied. The limb is placed into a knee immobilizer and the patient is awakened and transported to recovery in stable condition.      Please note that a surgical assistant was a medical necessity for this procedure in order to perform it in a safe and expeditious manner. Surgical assistant was necessary to retract the ligaments and vital neurovascular structures to prevent injury to them and also necessary for proper positioning of the limb to allow for anatomic placement of the prosthesis.   Dione Plover Novah Goza, MD    12/03/2016, 8:12 AM

## 2016-12-03 NOTE — Anesthesia Postprocedure Evaluation (Signed)
Anesthesia Post Note  Patient: Courtney Nielsen  Procedure(s) Performed: Procedure(s) (LRB): RIGHT TOTAL KNEE ARTHROPLASTY (Right)     Patient location during evaluation: PACU Anesthesia Type: Spinal and Regional Level of consciousness: awake and alert Pain management: pain level controlled Vital Signs Assessment: post-procedure vital signs reviewed and stable Respiratory status: spontaneous breathing and respiratory function stable Cardiovascular status: blood pressure returned to baseline and stable Postop Assessment: spinal receding Anesthetic complications: no    Last Vitals:  Vitals:   12/03/16 0930 12/03/16 0954  BP: 139/88 (!) 146/71  Pulse: 74 71  Resp: 14 14  Temp: 36.5 C 36.4 C    Last Pain:  Vitals:   12/03/16 0930  TempSrc:   PainSc: 0-No pain                 Pinky Ravan,W. EDMOND

## 2016-12-03 NOTE — Anesthesia Procedure Notes (Signed)
Spinal  Patient location during procedure: OR Start time: 12/03/2016 7:08 AM End time: 12/03/2016 7:14 AM Staffing Anesthesiologist: Roderic Palau Performed: anesthesiologist  Preanesthetic Checklist Completed: patient identified, surgical consent, pre-op evaluation, timeout performed, IV checked, risks and benefits discussed and monitors and equipment checked Spinal Block Patient position: sitting Prep: DuraPrep Patient monitoring: cardiac monitor, continuous pulse ox and blood pressure Approach: midline Location: L3-4 Injection technique: single-shot Needle Needle type: Pencan  Needle gauge: 24 G Needle length: 9 cm Assessment Sensory level: T8 Additional Notes Functioning IV was confirmed and monitors were applied. Sterile prep and drape, including hand hygiene and sterile gloves were used. The patient was positioned and the spine was prepped. The skin was anesthetized with lidocaine.  Free flow of clear CSF was obtained prior to injecting local anesthetic into the CSF.  The spinal needle aspirated freely following injection.  The needle was carefully withdrawn.  The patient tolerated the procedure well.

## 2016-12-03 NOTE — Transfer of Care (Signed)
Immediate Anesthesia Transfer of Care Note  Patient: Courtney Nielsen  Procedure(s) Performed: Procedure(s): RIGHT TOTAL KNEE ARTHROPLASTY (Right)  Patient Location: PACU  Anesthesia Type:MAC combined with regional for post-op pain  Level of Consciousness:  sedated, patient cooperative and responds to stimulation  Airway & Oxygen Therapy:Patient Spontanous Breathing and Patient connected to face mask oxgen  Post-op Assessment:  Report given to PACU RN and Post -op Vital signs reviewed and stable  Post vital signs:  Reviewed and stable  Last Vitals:  Vitals:   12/03/16 0527  BP: (!) 152/83  Pulse: 95  Resp: 18  Temp: 09.7 C    Complications: No apparent anesthesia complications

## 2016-12-03 NOTE — H&P (View-Only) (Signed)
Courtney Nielsen DOB: December 14, 1955 Married / Language: English / Race: White Female Date of Admission:  12/03/2016 CC:  Right Knee Pain History of Present Illness The patient is a 61 year old female who comes in for a preoperative History and Physical. The patient is scheduled for a right total knee arthroplasty to be performed by Dr. Dione Plover. Aluisio, MD at [facility] on 12-03-2016. The patient is a 61 year old female who presents today for follow up of their knee. The patient is being followed for their right knee pain and osteoarthritis. They are now 8 week(s) out from [place]. Symptoms reported today include: pain and swelling. The patient feels that they are doing poorly and report their pain level to be moderate to severe. The following medication has been used for pain control: none. The patient has not gotten any relief of their symptoms with viscosupplementation (did not seem to offer relief). Courtney Nielsen feels as though her knee has not gotten any better. If anything, it is getting worse with time. The visco injections did not heal. She is unable to do things she desires. She does get swelling. She would like to be more active, but the knee is preventing her from doing so. She also has an arthritic left hip, but currently the right knee is much more symptomatic than the left hip. At this point, the most predictable means of improving pain and function is total knee arthroplasty. She has got advanced arthritic change in that knee with progressive pain and dysfunction. The cortisone and viscosupplement injections have not worked. She is at a stage where she needs to get something done to improve pain and function. The procedure, risks, potential complications and rehab course are discussed in detail and the patient elects to proceed. They have been treated conservatively in the past for the above stated problem and despite conservative measures, they continue to have progressive pain and severe  functional limitations and dysfunction. They have failed non-operative management including home exercise, medications, and injections. It is felt that they would benefit from undergoing total joint replacement. Risks and benefits of the procedure have been discussed with the patient and they elect to proceed with surgery. There are no active contraindications to surgery such as ongoing infection or rapidly progressive neurological disease.  Problem List/Past Medical   Pain of both hip joints (M25.551, M25.552)  Degeneration of cervical intervertebral disc (M50.90)  Primary osteoarthritis of right knee (M17.11)  Primary osteoarthritis of left hip (M16.12)  Irritable bowel syndrome  High blood pressure  Chronic Obstructive Lung Disease  Cataract  Bronchitis  Past History Pneumonia  Past History Hemorrhoids  Diverticulosis  Degenerative Disc Disease  Mumps  Menopause   Allergies Sulfanilamide *CHEMICALS*  Indomethacin *ANALGESICS - ANTI-INFLAMMATORY*  Verapamil HCl *CALCIUM CHANNEL BLOCKERS*  Meloxicam *ANALGESICS - ANTI-INFLAMMATORY*  Dilaudid *ANALGESICS - OPIOID*  Levaquin *FLUOROQUINOLONES*  Ceftin *CEPHALOSPORINS*  Coreg *BETA BLOCKERS*  Furosemide *DIURETICS*   Family History  Cancer  First Degree Relatives. father Osteoporosis  grandmother fathers side Hypertension  mother and father  Social History  Number of flights of stairs before winded  2-3 Most recent primary occupation  Tax inspector Marital status  married Pain Contract  no Tobacco / smoke exposure  no Previously in rehab  no Current work status  working full time Alcohol use  current drinker; only occasionally per week Drug/Alcohol Rehab (Currently)  no Tobacco use  Former smoker. Children  1 Illicit drug use  no Exercise  Exercises weekly; does  other and team sport Living situation  live with spouse  Medication History  Nucynta (75MG  Tablet, Oral)  Active. Acetaminophen (500MG  Tablet, Oral) Active. Xanax (0.5MG  Tablet, Oral) Active. Aspirin (81MG  Tablet, Oral) Active. CeleBREX (200MG  Capsule, Oral) Active. Vitamin D3 (10000UNIT Capsule, Oral) Active. Cyanocobalamin (500MCG Tablet, Oral) Active. Estradiol (0.5MG  Tablet, Oral) Active. Claritin (10MG  Capsule, Oral as needed) Active. Losartan Potassium (100MG  Tablet, Oral) Active. MedroxyPROGESTERone Acetate (2.5MG  Tablet, Oral) Active. Fish Oil Concentrate (1 (one) Oral) Specific strength unknown - Active.   Past Surgical History Colon Polyp Removal - Colonoscopy  Meniscal Repair  Date: 10/2015.   Review of Systems  General Not Present- Chills, Fatigue, Fever, Memory Loss, Night Sweats, Weight Gain and Weight Loss. Skin Not Present- Eczema, Hives, Itching, Lesions and Rash. HEENT Not Present- Dentures, Double Vision, Headache, Hearing Loss, Tinnitus and Visual Loss. Respiratory Not Present- Allergies, Chronic Cough, Coughing up blood, Shortness of breath at rest and Shortness of breath with exertion. Cardiovascular Not Present- Chest Pain, Difficulty Breathing Lying Down, Murmur, Palpitations, Racing/skipping heartbeats and Swelling. Gastrointestinal Not Present- Abdominal Pain, Bloody Stool, Constipation, Diarrhea, Difficulty Swallowing, Heartburn, Jaundice, Loss of appetitie, Nausea and Vomiting. Female Genitourinary Not Present- Blood in Urine, Discharge, Flank Pain, Incontinence, Painful Urination, Urgency, Urinary frequency, Urinary Retention, Urinating at Night and Weak urinary stream. Musculoskeletal Present- Back Pain. Not Present- Joint Pain, Joint Swelling, Morning Stiffness, Muscle Pain, Muscle Weakness and Spasms. Neurological Not Present- Blackout spells, Difficulty with balance, Dizziness, Paralysis, Tremor and Weakness. Psychiatric Not Present- Insomnia.  Vitals  Pulse: 76 (Regular)  BP: 132/88 (Sitting, Left Arm, Standard)     Physical  Exam General Mental Status -Alert, cooperative and good historian. General Appearance-pleasant, Not in acute distress. Orientation-Oriented X3. Build & Nutrition-Well nourished and Well developed.  Head and Neck Head-normocephalic, atraumatic . Neck Global Assessment - supple, no bruit auscultated on the right, no bruit auscultated on the left.  Eye Vision-Wears corrective lenses. Pupil - Bilateral-Regular and Round. Motion - Bilateral-EOMI.  ENMT Note: partial upper and partial lower denture plates   Chest and Lung Exam Auscultation Breath sounds - clear at anterior chest wall and clear at posterior chest wall. Adventitious sounds - No Adventitious sounds.  Cardiovascular Auscultation Rhythm - Regular rate and rhythm. Heart Sounds - S1 WNL and S2 WNL. Murmurs & Other Heart Sounds - Auscultation of the heart reveals - No Murmurs.  Abdomen Palpation/Percussion Tenderness - Abdomen is non-tender to palpation. Rigidity (guarding) - Abdomen is soft. Auscultation Auscultation of the abdomen reveals - Bowel sounds normal.  Female Genitourinary Note: Not done, not pertinent to present illness   Musculoskeletal Note: On exam, well-developed female, in no distress. Her right knee shows trace effusion. Range about 5 to120. She does not have any tenderness or any instability noted.  Assessment & Plan  Primary osteoarthritis of left hip (M16.12) Primary osteoarthritis of right knee (M17.11)  Note:Surgical Plans: Right Total Knee Replacement  Disposition: Home with husband and daughter, Start with HHPT  PCP: Dr. Alain Marion - Patient has been seen preoperatively and felt to be stable for surgery.  IV TXA  Anesthesia Issues: None  Patient was instructed on what medications to stop prior to surgery.  Signed electronically by Joelene Millin, III PA-C

## 2016-12-03 NOTE — Anesthesia Procedure Notes (Signed)
Anesthesia Regional Block: Adductor canal block   Pre-Anesthetic Checklist: ,, timeout performed, Correct Patient, Correct Site, Correct Laterality, Correct Procedure, Correct Position, site marked, Risks and benefits discussed, pre-op evaluation,  At surgeon's request and post-op pain management  Laterality: Right  Prep: Maximum Sterile Barrier Precautions used, chloraprep       Needles:  Injection technique: Single-shot  Needle Type: Echogenic Stimulator Needle     Needle Length: 9cm  Needle Gauge: 21     Additional Needles:   Procedures: ultrasound guided,,,,,,,,  Narrative:  Start time: 12/03/2016 6:37 AM End time: 12/03/2016 6:47 AM Injection made incrementally with aspirations every 5 mL. Anesthesiologist: Roderic Palau  Additional Notes: 2% Lidocaine skin wheel.

## 2016-12-03 NOTE — Interval H&P Note (Signed)
History and Physical Interval Note:  12/03/2016 6:31 AM  Courtney Nielsen  has presented today for surgery, with the diagnosis of right knee osteoarthritis  The various methods of treatment have been discussed with the patient and family. After consideration of risks, benefits and other options for treatment, the patient has consented to  Procedure(s): RIGHT TOTAL KNEE ARTHROPLASTY (Right) as a surgical intervention .  The patient's history has been reviewed, patient examined, no change in status, stable for surgery.  I have reviewed the patient's chart and labs.  Questions were answered to the patient's satisfaction.     [name]

## 2016-12-04 DIAGNOSIS — I10 Essential (primary) hypertension: Secondary | ICD-10-CM | POA: Diagnosis not present

## 2016-12-04 DIAGNOSIS — Z7982 Long term (current) use of aspirin: Secondary | ICD-10-CM | POA: Diagnosis not present

## 2016-12-04 DIAGNOSIS — E538 Deficiency of other specified B group vitamins: Secondary | ICD-10-CM | POA: Diagnosis not present

## 2016-12-04 DIAGNOSIS — M1711 Unilateral primary osteoarthritis, right knee: Secondary | ICD-10-CM | POA: Diagnosis present

## 2016-12-04 DIAGNOSIS — Z79899 Other long term (current) drug therapy: Secondary | ICD-10-CM | POA: Diagnosis not present

## 2016-12-04 DIAGNOSIS — Z882 Allergy status to sulfonamides status: Secondary | ICD-10-CM | POA: Diagnosis not present

## 2016-12-04 DIAGNOSIS — Z87891 Personal history of nicotine dependence: Secondary | ICD-10-CM | POA: Diagnosis not present

## 2016-12-04 DIAGNOSIS — J449 Chronic obstructive pulmonary disease, unspecified: Secondary | ICD-10-CM | POA: Diagnosis not present

## 2016-12-04 LAB — BASIC METABOLIC PANEL
Anion gap: 6 (ref 5–15)
BUN: 9 mg/dL (ref 6–20)
CO2: 25 mmol/L (ref 22–32)
CREATININE: 0.77 mg/dL (ref 0.44–1.00)
Calcium: 8.4 mg/dL — ABNORMAL LOW (ref 8.9–10.3)
Chloride: 106 mmol/L (ref 101–111)
GFR calc Af Amer: >60 mL/min (ref >60)
GFR calc non Af Amer: >60 mL/min (ref >60)
GLUCOSE: 128 mg/dL — AB (ref 65–99)
Potassium: 3.5 mmol/L (ref 3.5–5.1)
Sodium: 137 mmol/L (ref 135–145)

## 2016-12-04 LAB — CBC
HEMATOCRIT: 35.4 % — AB (ref 36.0–46.0)
Hemoglobin: 11.7 g/dL — ABNORMAL LOW (ref 12.0–15.0)
MCH: 30.8 pg (ref 26.0–34.0)
MCHC: 33.1 g/dL (ref 30.0–36.0)
MCV: 93.2 fL (ref 78.0–100.0)
Platelets: 237 10*3/uL (ref 150–400)
RBC: 3.8 MIL/uL — ABNORMAL LOW (ref 3.87–5.11)
RDW: 13.5 % (ref 11.5–15.5)
WBC: 11.2 10*3/uL — ABNORMAL HIGH (ref 4.0–10.5)

## 2016-12-04 MED ORDER — METHOCARBAMOL 500 MG PO TABS: 500.0000 mg | ORAL_TABLET | Freq: Four times a day (QID) | ORAL | 0 refills | Status: DC | PRN

## 2016-12-04 MED ORDER — OXYCODONE HCL 5 MG PO TABS: 5.0000 mg | ORAL_TABLET | ORAL | 0 refills | Status: DC | PRN

## 2016-12-04 MED ORDER — TRAMADOL HCL 50 MG PO TABS: 50.0000 mg | ORAL_TABLET | Freq: Four times a day (QID) | ORAL | 0 refills | Status: DC | PRN

## 2016-12-04 MED ORDER — RIVAROXABAN 10 MG PO TABS: 10.0000 mg | ORAL_TABLET | Freq: Every day | ORAL | 0 refills | Status: DC

## 2016-12-04 NOTE — Progress Notes (Signed)
pulled two Oxycodone tabs when only wanting to pull one, discrepancies were cleared from pyxis before I could return and resolve. Patient was given one tab and returned and one to pyxix.   Confirmation that 1, 5mg  tab oxycodone returned to pyxis 1 on 6E

## 2016-12-04 NOTE — Evaluation (Signed)
Occupational Therapy Evaluation Patient Details Name: Courtney Nielsen MRN: [id number] DOB: [birth year] Today's Date: 12/04/2016    History of Present Illness R TKA   Clinical Impression   This 61 year old female was admitted for the above sx. All education was completed. No further OT is needed at this time    Follow Up Recommendations  No OT follow up    Equipment Recommendations  None recommended by OT    Recommendations for Other Services       Precautions / Restrictions Precautions Precautions: Knee Precaution Comments: reviewed no pillow under R knee Required Braces or Orthoses: Knee Immobilizer - Right Knee Immobilizer - Right: Discontinue once straight leg raise with < 10 degree lag Restrictions Weight Bearing Restrictions: No Other Position/Activity Restrictions: WBAT      Mobility Bed Mobility Overal bed mobility: Needs Assistance Bed Mobility: Sit to Supine       Sit to supine: Min assist   General bed mobility comments: assist for RLE  Transfers Overall transfer level: Needs assistance Equipment used: Rolling walker (2 wheeled) Transfers: Sit to/from Stand Sit to Stand: Min guard         General transfer comment: VCs hand placement    Balance Overall balance assessment: Modified Independent                                         ADL either performed or assessed with clinical judgement   ADL Overall ADL's : Needs assistance/impaired     Grooming: Oral care;Modified independent;Standing   Upper Body Bathing: Set up;Sitting   Lower Body Bathing: Minimal assistance;Sit to/from stand   Upper Body Dressing : Set up;Sitting   Lower Body Dressing: Min guard;Sit to/from stand   Toilet Transfer: Min guard;Ambulation;RW (chair; had just used toilet)             General ADL Comments: educated on tub readiness; options for sitting back onto seat if secure vs. readiness of stepping in.  Pt had 2 pillows under leg;  educated on no pillow under knee.  Pt verbalizes understanding.  Husband will assist with adls     Vision         Perception     Praxis      Pertinent Vitals/Pain Pain Score: 8  (after weight bearing) Pain Location: R knee Pain Descriptors / Indicators: Sore Pain Intervention(s): Limited activity within patient's tolerance;Monitored during session;Repositioned;Premedicated before session;Ice applied     Hand Dominance     Extremity/Trunk Assessment Upper Extremity Assessment Upper Extremity Assessment: Overall WFL for tasks assessed           Communication Communication Communication: No difficulties   Cognition Arousal/Alertness: Awake/alert Behavior During Therapy: WFL for tasks assessed/performed Overall Cognitive Status: Within Functional Limits for tasks assessed                                     General Comments       Exercises Total Joint Exercises Ankle Circles/Pumps: AROM;Both;10 reps;Supine Short Arc Quad: AAROM;Right;10 reps;Supine Heel Slides: AAROM;Right;10 reps;Supine Straight Leg Raises: AAROM;Right;10 reps;Supine Goniometric ROM: 5-40* AAROM R knee   Shoulder Instructions      Home Living Family/patient expects to be discharged to:: Private residence Living Arrangements: Spouse/significant other Available Help at Discharge: Family;Available 24 hours/day  Bathroom Shower/Tub: Teacher, early years/pre: Standard     Home Equipment: Environmental consultant - 2 wheels;Toilet riser;Shower seat          Prior Functioning/Environment Level of Independence: Independent                 OT Problem List:        OT Treatment/Interventions:      OT Goals(Current goals can be found in the care plan section) Acute Rehab OT Goals Patient Stated Goal: bowling, walking OT Goal Formulation: All assessment and education complete, DC therapy  OT Frequency:     Barriers to D/C:            Co-evaluation               AM-PAC PT "6 Clicks" Daily Activity     Outcome Measure Help from another person eating meals?: None Help from another person taking care of personal grooming?: A Little Help from another person toileting, which includes using toliet, bedpan, or urinal?: A Little Help from another person bathing (including washing, rinsing, drying)?: A Little Help from another person to put on and taking off regular upper body clothing?: A Little Help from another person to put on and taking off regular lower body clothing?: A Little 6 Click Score: 19   End of Session    Activity Tolerance: Patient tolerated treatment well Patient left: in chair;with call bell/phone within reach  OT Visit Diagnosis: Pain Pain - Right/Left: Right Pain - part of body: Knee                Time: 8502-7741 OT Time Calculation (min): 15 min Charges:  OT General Charges $OT Visit: 1 Procedure OT Evaluation $OT Eval Low Complexity: 1 Procedure G-Codes:     Golf, OTR/L [phone] 12/04/2016  Vaughan Garfinkle 12/04/2016, 11:46 AM

## 2016-12-04 NOTE — Progress Notes (Signed)
   Subjective: 1 Day Post-Op Procedure(s) (LRB): RIGHT TOTAL KNEE ARTHROPLASTY (Right) Patient reports pain as mild.   Patient seen in rounds for Dr. Wynelle Link.  She was found to be doing very well on the morning of day one. Patient is well, but has had some minor complaints of pain in the knee, requiring pain medications We will resume therapy today.  If they do well with therapy and meets all goals, then will allow home later this afternoon following therapy. Plan is to go Home after hospital stay.  Objective: Vital signs in last 24 hours: Temp:  [97.5 F (36.4 C)-98.4 F (36.9 C)] 98.1 F (36.7 C) (06/05 0515) Pulse Rate:  [69-90] 81 (06/05 0515) Resp:  [12-18] 18 (06/05 0515) BP: (126-162)/(70-99) 126/70 (06/05 0515) SpO2:  [96 %-100 %] 96 % (06/05 0515)  Intake/Output from previous day:  Intake/Output Summary (Last 24 hours) at 12/04/16 0846 Last data filed at 12/04/16 0600  Gross per 24 hour  Intake          2396.25 ml  Output             1465 ml  Net           931.25 ml    Intake/Output this shift: No intake/output data recorded.  Labs:  Recent Labs  12/04/16 0436  HGB 11.7*    Recent Labs  12/04/16 0436  WBC 11.2*  RBC 3.80*  HCT 35.4*  PLT 237    Recent Labs  12/04/16 0436  NA 137  K 3.5  CL 106  CO2 25  BUN 9  CREATININE 0.77  GLUCOSE 128*  CALCIUM 8.4*   No results for input(s): LABPT, INR in the last 72 hours.  EXAM General - Patient is Alert, Appropriate and Oriented Extremity - Neurovascular intact Sensation intact distally Intact pulses distally Dorsiflexion/Plantar flexion intact Dressing - dressing C/D/I Motor Function - intact, moving foot and toes well on exam.  Hemovac pulled without difficulty.  Past Medical History:  Diagnosis Date  . Allergic rhinitis   . Arthritis   . Chest pain   . Colonic polyp   . COPD (chronic obstructive pulmonary disease) ([place])    patient denies at preop on ;   . Dysrhythmia    hx of every  third beat is " off" per patient   . Hypertension   . Pneumonia    hx of 2014   . Smoker   . Vitamin B12 deficiency     Assessment/Plan: 1 Day Post-Op Procedure(s) (LRB): RIGHT TOTAL KNEE ARTHROPLASTY (Right) Principal Problem:   OA (osteoarthritis) of knee  Estimated body mass index is 33 kg/m as calculated from the following:   Height as of this encounter: 5\' 10"  (1.778 m).   Weight as of this encounter: 104.3 kg (230 lb). Advance diet Up with therapy Discharge home with home health  DVT Prophylaxis - Xarelto Weight-Bearing as tolerated to right leg D/C O2 and Pulse OX and try on Room Air  If meets goals and able to go home: Up with therapy Discharge home with home health Diet - Cardiac diet Follow up - in 2 weeks Activity - WBAT Disposition - Home Condition Upon Discharge - pending therapy D/C Meds - See DC Summary DVT Prophylaxis - Mason, PA-C Orthopaedic Surgery

## 2016-12-04 NOTE — Progress Notes (Signed)
Physical Therapy Treatment Patient Details Name: Courtney Nielsen MRN: [id number] DOB: 03-29-1956 Today's Date: 12/04/2016    History of Present Illness R TKA    PT Comments    POD # 1 pm session Assisted pt OOB to amb to bathroom, amb in hallway and practice one step to enter her home.  Returned to room and performed TKR TE's following HEP handout.  Instructed on proper tech, freq as well as use of ICE.   Follow Up Recommendations  Supervision for mobility/OOB;Home health PT     Equipment Recommendations  None recommended by PT    Recommendations for Other Services       Precautions / Restrictions Precautions Precautions: Knee Precaution Comments: reviewed no pillow under R knee Required Braces or Orthoses: Knee Immobilizer - Right Knee Immobilizer - Right: Discontinue once straight leg raise with < 10 degree lag Restrictions Weight Bearing Restrictions: No Other Position/Activity Restrictions: WBAT    Mobility  Bed Mobility Overal bed mobility: Needs Assistance Bed Mobility: Supine to Sit     Supine to sit: Min guard;Supervision Sit to supine: Min assist   General bed mobility comments: assist for RLE and increased time  Transfers Overall transfer level: Needs assistance Equipment used: Rolling walker (2 wheeled) Transfers: Sit to/from Stand Sit to Stand: Supervision;Min guard         General transfer comment: increased time, no VC's  Ambulation/Gait Ambulation/Gait assistance: Supervision;Min guard Ambulation Distance (Feet): 55 Feet Assistive device: Rolling walker (2 wheeled) Gait Pattern/deviations: Step-to pattern;Decreased step length - right;Decreased step length - left Gait velocity: decreased Gait velocity interpretation: Below normal speed for age/gender General Gait Details: increased time, no VC's needed   Stairs Stairs: Yes   Stair Management: No rails;Step to pattern;Forwards Number of Stairs: 1 General stair comments: 25% VC's on  proper walker placement and LE sequencing.  Performed well.   Wheelchair Mobility    Modified Rankin (Stroke Patients Only)       Balance Overall balance assessment: Modified Independent                                          Cognition Arousal/Alertness: Awake/alert Behavior During Therapy: WFL for tasks assessed/performed Overall Cognitive Status: Within Functional Limits for tasks assessed                                        Exercises Total Joint Exercises Ankle Circles/Pumps: AROM;Both;10 reps;Supine Short Arc Quad: AAROM;Right;10 reps;Supine Heel Slides: AAROM;Right;10 reps;Supine Straight Leg Raises: AAROM;Right;10 reps;Supine Goniometric ROM: 5-40* AAROM R knee    General Comments        Pertinent Vitals/Pain Pain Assessment: 0-10 Pain Score: 5  Pain Location: R knee Pain Descriptors / Indicators: Operative site guarding;Discomfort;Tender Pain Intervention(s): Monitored during session;Repositioned;Ice applied    Home Living Family/patient expects to be discharged to:: Private residence Living Arrangements: Spouse/significant other Available Help at Discharge: Family;Available 24 hours/day         Home Equipment: Walker - 2 wheels;Toilet riser;Shower seat      Prior Function Level of Independence: Independent          PT Goals (current goals can now be found in the care plan section) Acute Rehab PT Goals Patient Stated Goal: bowling, walking PT Goal Formulation: With patient Time For Goal Achievement:  12/10/16 Potential to Achieve Goals: Good Progress towards PT goals: Progressing toward goals    Frequency    7X/week      PT Plan Current plan remains appropriate    Co-evaluation              AM-PAC PT "6 Clicks" Daily Activity  Outcome Measure  Difficulty turning over in bed (including adjusting bedclothes, sheets and blankets)?: A Little Difficulty moving from lying on back to sitting on  the side of the bed? : A Little Difficulty sitting down on and standing up from a chair with arms (e.g., wheelchair, bedside commode, etc,.)?: A Little Help needed moving to and from a bed to chair (including a wheelchair)?: A Little Help needed walking in hospital room?: A Little Help needed climbing 3-5 steps with a railing? : A Lot 6 Click Score: 17    End of Session Equipment Utilized During Treatment: Gait belt Activity Tolerance: Patient tolerated treatment well Patient left: in chair;with call bell/phone within reach;with family/visitor present Nurse Communication:  (Pt readu for D/C to home) PT Visit Diagnosis: Pain;Difficulty in walking, not elsewhere classified (R26.2);Muscle weakness (generalized) (M62.81) Pain - Right/Left: Right Pain - part of body: Knee     Time: 9432-7614 PT Time Calculation (min) (ACUTE ONLY): 32 min  Charges:  $Gait Training: 8-22 mins $Therapeutic Exercise: 8-22 mins                    G Codes:       Rica Koyanagi  PTA [facility]  Acute  Rehab Pager      [phone]

## 2016-12-04 NOTE — Progress Notes (Addendum)
Discharge planning, spoke with patient and spouse at bedside. Have chosen [facility] for [facility] PT. Contacted [facility] for referral. Has DME already. [phone]

## 2016-12-04 NOTE — Progress Notes (Signed)
Physical Therapy Treatment Patient Details Name: Courtney Nielsen MRN: [id number] DOB: February 03, 1956 Today's Date: 12/04/2016    History of Present Illness R TKA    PT Comments    Pt progressing well with mobility, she ambulated 100' with RW and performed TKA exercises with min A. Will do stair training this afternoon.   Follow Up Recommendations  Supervision for mobility/OOB;Home health PT     Equipment Recommendations  None recommended by PT    Recommendations for Other Services       Precautions / Restrictions Precautions Precautions: Knee Precaution Comments: reviewed no pillow under R knee Required Braces or Orthoses: Knee Immobilizer - Right Knee Immobilizer - Right: Discontinue once straight leg raise with < 10 degree lag Restrictions Weight Bearing Restrictions: No Other Position/Activity Restrictions: WBAT    Mobility  Bed Mobility Overal bed mobility: Needs Assistance Bed Mobility: Sit to Supine       Sit to supine: Min assist   General bed mobility comments: min A for LEs into bed  Transfers Overall transfer level: Needs assistance Equipment used: Rolling walker (2 wheeled) Transfers: Sit to/from Stand Sit to Stand: Min guard         General transfer comment: VCs hand placement  Ambulation/Gait Ambulation/Gait assistance: Min guard Ambulation Distance (Feet): 100 Feet Assistive device: Rolling walker (2 wheeled) Gait Pattern/deviations: Step-to pattern;Decreased step length - right;Decreased step length - left   Gait velocity interpretation: Below normal speed for age/gender General Gait Details: VCs sequencing, no LOB, distance limited by pain   Stairs            Wheelchair Mobility    Modified Rankin (Stroke Patients Only)       Balance Overall balance assessment: Modified Independent                                          Cognition Arousal/Alertness: Awake/alert Behavior During Therapy: WFL for tasks  assessed/performed Overall Cognitive Status: Within Functional Limits for tasks assessed                                        Exercises Total Joint Exercises Ankle Circles/Pumps: AROM;Both;10 reps;Supine Short Arc Quad: AAROM;Right;10 reps;Supine Heel Slides: AAROM;Right;10 reps;Supine Straight Leg Raises: AAROM;Right;10 reps;Supine Goniometric ROM: 5-40* AAROM R knee    General Comments        Pertinent Vitals/Pain Pain Score: 9  Pain Location: R knee Pain Descriptors / Indicators: Sore Pain Intervention(s): Limited activity within patient's tolerance;Monitored during session;Premedicated before session;Ice applied;Patient requesting pain meds-RN notified    Home Living                      Prior Function            PT Goals (current goals can now be found in the care plan section) Acute Rehab PT Goals Patient Stated Goal: bowling, walking PT Goal Formulation: With patient Time For Goal Achievement: 12/10/16 Potential to Achieve Goals: Good Progress towards PT goals: Progressing toward goals    Frequency    7X/week      PT Plan Current plan remains appropriate    Co-evaluation              AM-PAC PT "6 Clicks" Daily Activity  Outcome Measure  Difficulty turning  over in bed (including adjusting bedclothes, sheets and blankets)?: A Little Difficulty moving from lying on back to sitting on the side of the bed? : A Little Difficulty sitting down on and standing up from a chair with arms (e.g., wheelchair, bedside commode, etc,.)?: A Little Help needed moving to and from a bed to chair (including a wheelchair)?: A Little Help needed walking in hospital room?: A Little Help needed climbing 3-5 steps with a railing? : A Lot 6 Click Score: 17    End of Session Equipment Utilized During Treatment: Gait belt;Right knee immobilizer Activity Tolerance: Patient tolerated treatment well Patient left: in chair;with call bell/phone  within reach;with family/visitor present Nurse Communication: Mobility status PT Visit Diagnosis: Pain;Difficulty in walking, not elsewhere classified (R26.2);Muscle weakness (generalized) (M62.81) Pain - Right/Left: Right Pain - part of body: Knee     Time: 6759-1638 PT Time Calculation (min) (ACUTE ONLY): 24 min  Charges:  $Gait Training: 8-22 mins $Therapeutic Exercise: 8-22 mins                    G Codes:          Philomena Doheny 12/04/2016, 11:38 AM [phone]

## 2016-12-04 NOTE — Discharge Summary (Signed)
Physician Discharge Summary   Patient ID: Courtney Nielsen MRN: [id number] DOB/AGE: Dec 22, 1955 61 y.o.  Admit date: 12/03/2016 Discharge date: 12/04/16   Primary Diagnosis:  Osteoarthritis  Right knee(s) Admission Diagnoses:  Past Medical History:  Diagnosis Date  . Allergic rhinitis   . Arthritis   . Chest pain   . Colonic polyp   . COPD (chronic obstructive pulmonary disease) ([place])    patient denies at preop on ;   . Dysrhythmia    hx of every third beat is " off" per patient   . Hypertension   . Pneumonia    hx of 2014   . Smoker   . Vitamin B12 deficiency    Discharge Diagnoses:   Principal Problem:   OA (osteoarthritis) of knee  Estimated body mass index is 33 kg/m as calculated from the following:   Height as of this encounter: _0  (1.778 m).   Weight as of this encounter: 104.3 kg (230 lb).  Procedure:  Procedure(s) (LRB): RIGHT TOTAL KNEE ARTHROPLASTY (Right)   Consults: None  HPI: Courtney Nielsen is a 61 y.o. year old female with end stage OA of her right knee with progressively worsening pain and dysfunction. She has constant pain, with activity and at rest and significant functional deficits with difficulties even with ADLs. She has had extensive non-op management including analgesics, injections of cortisone and viscosupplements, and home exercise program, but remains in significant pain with significant dysfunction.Radiographs show bone on bone arthritis lateral and patellofemoral. She presents now for right Total Knee Arthroplasty.    Laboratory Data: Admission on 12/03/2016  Component Date Value Ref Range Status  . WBC 12/04/2016 11.2* 4.0 - 10.5 K/uL Final  . RBC 12/04/2016 3.80* 3.87 - 5.11 MIL/uL Final  . Hemoglobin 12/04/2016 11.7* 12.0 - 15.0 g/dL Final  . HCT 12/04/2016 35.4* 36.0 - 46.0 % Final  . MCV 12/04/2016 93.2  78.0 - 100.0 fL Final  . MCH 12/04/2016 30.8  26.0 - 34.0 pg Final  . MCHC 12/04/2016 33.1  30.0 - 36.0 g/dL Final  . RDW  12/04/2016 13.5  11.5 - 15.5 % Final  . Platelets 12/04/2016 237  150 - 400 K/uL Final  . Sodium 12/04/2016 137  135 - 145 mmol/L Final  . Potassium 12/04/2016 3.5  3.5 - 5.1 mmol/L Final  . Chloride 12/04/2016 106  101 - 111 mmol/L Final  . CO2 12/04/2016 25  22 - 32 mmol/L Final  . Glucose, Bld 12/04/2016 128* 65 - 99 mg/dL Final  . BUN 12/04/2016 9  6 - 20 mg/dL Final  . Creatinine, Ser 12/04/2016 0.77  0.44 - 1.00 mg/dL Final  . Calcium 12/04/2016 8.4* 8.9 - 10.3 mg/dL Final  . GFR calc non Af Amer 12/04/2016 >60  >60 mL/min Final  . GFR calc Af Amer 12/04/2016 >60  >60 mL/min Final   Comment: (NOTE) The eGFR has been calculated using the CKD EPI equation. This calculation has not been validated in all clinical situations. eGFR's persistently <60 mL/min signify possible Chronic Kidney Disease.   Georgiann Hahn gap 12/04/2016 6  5 - 15 Final  Hospital Outpatient Visit on 11/22/2016  Component Date Value Ref Range Status  . aPTT 11/22/2016 30  24 - 36 seconds Final  . WBC 11/22/2016 9.7  4.0 - 10.5 K/uL Final  . RBC 11/22/2016 4.40  3.87 - 5.11 MIL/uL Final  . Hemoglobin 11/22/2016 13.9  12.0 - 15.0 g/dL Final  . HCT 11/22/2016 41.5  36.0 -  46.0 % Final  . MCV 11/22/2016 94.3  78.0 - 100.0 fL Final  . MCH 11/22/2016 31.6  26.0 - 34.0 pg Final  . MCHC 11/22/2016 33.5  30.0 - 36.0 g/dL Final  . RDW 11/22/2016 13.6  11.5 - 15.5 % Final  . Platelets 11/22/2016 240  150 - 400 K/uL Final  . Sodium 11/22/2016 140  135 - 145 mmol/L Final  . Potassium 11/22/2016 4.3  3.5 - 5.1 mmol/L Final  . Chloride 11/22/2016 106  101 - 111 mmol/L Final  . CO2 11/22/2016 26  22 - 32 mmol/L Final  . Glucose, Bld 11/22/2016 98  65 - 99 mg/dL Final  . BUN 11/22/2016 15  6 - 20 mg/dL Final  . Creatinine, Ser 11/22/2016 0.95  0.44 - 1.00 mg/dL Final  . Calcium 11/22/2016 9.0  8.9 - 10.3 mg/dL Final  . Total Protein 11/22/2016 7.1  6.5 - 8.1 g/dL Final  . Albumin 11/22/2016 3.7  3.5 - 5.0 g/dL Final  . AST  11/22/2016 16  15 - 41 U/L Final  . ALT 11/22/2016 23  14 - 54 U/L Final  . Alkaline Phosphatase 11/22/2016 75  38 - 126 U/L Final  . Total Bilirubin 11/22/2016 0.9  0.3 - 1.2 mg/dL Final  . GFR calc non Af Amer 11/22/2016 >60  >60 mL/min Final  . GFR calc Af Amer 11/22/2016 >60  >60 mL/min Final   Comment: (NOTE) The eGFR has been calculated using the CKD EPI equation. This calculation has not been validated in all clinical situations. eGFR's persistently <60 mL/min signify possible Chronic Kidney Disease.   . Anion gap 11/22/2016 8  5 - 15 Final  . Prothrombin Time 11/22/2016 12.8  11.4 - 15.2 seconds Final  . INR 11/22/2016 0.96   Final  . ABO/RH(D) 11/22/2016 O POS   Final  . Antibody Screen 11/22/2016 NEG   Final  . Sample Expiration 11/22/2016 12/06/2016   Final  . Extend sample reason 11/22/2016 NO TRANSFUSIONS OR PREGNANCY IN THE PAST 3 MONTHS   Final  . MRSA, PCR 11/22/2016 NEGATIVE  NEGATIVE Final  . Staphylococcus aureus 11/22/2016 NEGATIVE  NEGATIVE Final   Comment:        The Xpert SA Assay (FDA approved for NASAL specimens in patients over 38 years of age), is one component of a comprehensive surveillance program.  Test performance has been validated by [facility] for patients greater than or equal to 84 year old. It is not intended to diagnose infection nor to guide or monitor treatment.   . ABO/RH(D) 11/22/2016 O POS   Final     X-Rays:Dg Chest 2 View  Result Date: 11/22/2016 CLINICAL DATA:  Pre right knee replacement evaluation. Smoker. COPD. EXAM: CHEST  2 VIEW COMPARISON:  04/19/2011. FINDINGS: Normal sized heart. Clear lungs. The lungs are mildly hyperexpanded with mild diffuse peribronchial thickening and accentuation of the interstitial markings. Upper lumbar spine degenerative changes. IMPRESSION: Mild changes of COPD and chronic bronchitis.  No acute abnormality. Electronically Signed   By: Claudie Revering M.D.   On: 11/22/2016 20:09    EKG: Orders  placed or performed during the hospital encounter of 11/22/16  . EKG 12 lead  . EKG 12 lead     Hospital Course: Courtney Nielsen is a 61 y.o. who was admitted to [facility]. They were brought to the operating room on 12/03/2016 and underwent Procedure(s): RIGHT TOTAL KNEE ARTHROPLASTY.  Patient tolerated the procedure well and was later  transferred to the recovery room and then to the orthopaedic floor for postoperative care.  They were given PO and IV analgesics for pain control following their surgery.  They were given 24 hours of postoperative antibiotics of  Anti-infectives    Start     Dose/Rate Route Frequency Ordered Stop   12/03/16 1900  vancomycin (VANCOCIN) IVPB 1000 mg/200 mL premix     1,000 mg 200 mL/hr over 60 Minutes Intravenous Every 12 hours 12/03/16 1030 12/03/16 2050   12/03/16 0600  vancomycin (VANCOCIN) 1,500 mg in sodium chloride 0.9 % 500 mL IVPB     1,500 mg 250 mL/hr over 120 Minutes Intravenous On call to O.R. 12/02/16 1321 12/03/16 0646     and started on DVT prophylaxis in the form of Xarelto.   PT and OT were ordered for total joint protocol.  Discharge planning consulted to help with postop disposition and equipment needs.  Patient had a good night on the evening of surgery.  They started to get up OOB with therapy on day one. Hemovac drain was pulled without difficulty. Patient was seen in rounds on day one and was ready to go home.  Discharge home with home health Diet - Cardiac diet Follow up - in 2 weeks Activity - WBAT Disposition - Home Condition Upon Discharge - good D/C Meds - See DC Summary DVT Prophylaxis - Xarelto   Discharge Instructions    Call MD / Call 911    Complete by:  As directed    If you experience chest pain or shortness of breath, CALL 911 and be transported to the hospital emergency room.  If you develope a fever above 101 F, pus (white drainage) or increased drainage or redness at the wound, or calf pain, call your  surgeon's office.   Change dressing    Complete by:  As directed    Change dressing daily with sterile 4 x 4 inch gauze dressing and apply TED hose. Do not submerge the incision under water.   Constipation Prevention    Complete by:  As directed    Drink plenty of fluids.  Prune juice may be helpful.  You may use a stool softener, such as Colace (over the counter) 100 mg twice a day.  Use MiraLax (over the counter) for constipation as needed.   Diet - low sodium heart healthy    Complete by:  As directed    Discharge instructions    Complete by:  As directed    Take Xarelto for two and a half more weeks, then discontinue Xarelto. Once the patient has completed the Xarelto, they may resume the 81 mg Aspirin.   Pick up stool softner and laxative for home use following surgery while on pain medications. Do not submerge incision under water. Please use good hand washing techniques while changing dressing each day. May shower starting three days after surgery. Please use a clean towel to pat the incision dry following showers. Continue to use ice for pain and swelling after surgery. Do not use any lotions or creams on the incision until instructed by your surgeon.  Wear both TED hose on both legs during the day every day for three weeks, but may remove the TED hose at night at home.  Postoperative Constipation Protocol  Constipation - defined medically as fewer than three stools per week and severe constipation as less than one stool per week.  One of the most common issues patients have following surgery is constipation.  Even if you have a regular bowel pattern at home, your normal regimen is likely to be disrupted due to multiple reasons following surgery.  Combination of anesthesia, postoperative narcotics, change in appetite and fluid intake all can affect your bowels.  In order to avoid complications following surgery, here are some recommendations in order to help you during your  recovery period.  Colace (docusate) - Pick up an over-the-counter form of Colace or another stool softener and take twice a day as long as you are requiring postoperative pain medications.  Take with a full glass of water daily.  If you experience loose stools or diarrhea, hold the colace until you stool forms back up.  If your symptoms do not get better within 1 week or if they get worse, check with your doctor.  Dulcolax (bisacodyl) - Pick up over-the-counter and take as directed by the product packaging as needed to assist with the movement of your bowels.  Take with a full glass of water.  Use this product as needed if not relieved by Colace only.   MiraLax (polyethylene glycol) - Pick up over-the-counter to have on hand.  MiraLax is a solution that will increase the amount of water in your bowels to assist with bowel movements.  Take as directed and can mix with a glass of water, juice, soda, coffee, or tea.  Take if you go more than two days without a movement. Do not use MiraLax more than once per day. Call your doctor if you are still constipated or irregular after using this medication for 7 days in a row.  If you continue to have problems with postoperative constipation, please contact the office for further assistance and recommendations.  If you experience "the worst abdominal pain ever" or develop nausea or vomiting, please contact the office immediatly for further recommendations for treatment.   Do not put a pillow under the knee. Place it under the heel.    Complete by:  As directed    Do not sit on low chairs, stoools or toilet seats, as it may be difficult to get up from low surfaces    Complete by:  As directed    Driving restrictions    Complete by:  As directed    No driving until released by the physician.   Increase activity slowly as tolerated    Complete by:  As directed    Lifting restrictions    Complete by:  As directed    No lifting until released by the physician.     Patient may shower    Complete by:  As directed    You may shower without a dressing once there is no drainage.  Do not wash over the wound.  If drainage remains, do not shower until drainage stops.   TED hose    Complete by:  As directed    Use stockings (TED hose) for 3 weeks on both leg(s).  You may remove them at night for sleeping.   Weight bearing as tolerated    Complete by:  As directed    Laterality:  right   Extremity:  Lower     Allergies as of 12/04/2016      Reactions   Meloxicam Swelling, Other (See Comments)   Leg swelling   Dilaudid [hydromorphone Hcl]    Burning of the face   Levaquin [levofloxacin]    Head itching   Ceftin [cefuroxime Axetil] Itching, Other (See Comments)   pruritis   Coreg [carvedilol]  Nausea Only   Furosemide Other (See Comments)   cramps   Prednisone Itching, Other (See Comments)   Patient states she has tolerated   Sulfonamide Derivatives Nausea And Vomiting   Verapamil Other (See Comments)   edema      Medication List    STOP taking these medications   aspirin EC 81 MG tablet   celecoxib 200 MG capsule Commonly known as:  CELEBREX   cholecalciferol 1000 units tablet Commonly known as:  VITAMIN D   estradiol 0.5 MG tablet Commonly known as:  ESTRACE   Fish Oil 1200 MG Caps   medroxyPROGESTERone 2.5 MG tablet Commonly known as:  PROVERA   vitamin B-12 500 MCG tablet Commonly known as:  CYANOCOBALAMIN     TAKE these medications   acetaminophen 500 MG tablet Commonly known as:  TYLENOL Take 1,000 mg by mouth every 6 (six) hours as needed for moderate pain.   ALPRAZolam 0.5 MG tablet Commonly known as:  XANAX TAKE ONE TABLET BY MOUTH AT BEDTIME AS NEEDED FOR ANXIETY. What changed:  how much to take  how to take this  when to take this  additional instructions   BLUE-EMU MAXIMUM STRENGTH EX Apply 1 application topically daily.   loratadine 10 MG tablet Commonly known as:  CLARITIN Take 10 mg by mouth at  bedtime.   losartan 100 MG tablet Commonly known as:  COZAAR Take 1 tablet (100 mg total) by mouth every evening. What changed:  when to take this   methocarbamol 500 MG tablet Commonly known as:  ROBAXIN Take 1 tablet (500 mg total) by mouth every 6 (six) hours as needed for muscle spasms.   oxyCODONE 5 MG immediate release tablet Commonly known as:  Oxy IR/ROXICODONE Take 1-2 tablets (5-10 mg total) by mouth every 4 (four) hours as needed for moderate pain or severe pain.   rivaroxaban 10 MG Tabs tablet Commonly known as:  XARELTO Take 1 tablet (10 mg total) by mouth daily with breakfast. Take Xarelto for two and a half more weeks following discharge from the hospital, then discontinue Xarelto. Once the patient has completed the Xarelto, they may resume the 81 mg Aspirin. Start taking on:  12/05/2016   traMADol 50 MG tablet Commonly known as:  ULTRAM Take 1-2 tablets (50-100 mg total) by mouth every 6 (six) hours as needed for moderate pain.      Follow-up Information    Gaynelle Arabian, MD. Schedule an appointment as soon as possible for a visit on 12/18/2016.   Specialty:  Orthopedic Surgery Contact information: [address] [place] [address] [phone]           Signed: Arlee Muslim, PA-C Orthopaedic Surgery 12/04/2016, 8:53 AM

## 2016-12-04 NOTE — Discharge Instructions (Addendum)
° °Dr. Frank Aluisio °Total Joint Specialist °[facility] °[address] °[place], [place] [address] °[phone] ° °TOTAL KNEE REPLACEMENT POSTOPERATIVE DIRECTIONS ° °Knee Rehabilitation, Guidelines Following Surgery  °Results after knee surgery are often greatly improved when you follow the exercise, range of motion and muscle strengthening exercises prescribed by your doctor. Safety measures are also important to protect the knee from further injury. Any time any of these exercises cause you to have increased pain or swelling in your knee joint, decrease the amount until you are comfortable again and slowly increase them. If you have problems or questions, call your caregiver or physical therapist for advice.  ° °HOME CARE INSTRUCTIONS  °Remove items at home which could result in a fall. This includes throw rugs or furniture in walking pathways.  °· ICE to the affected knee every three hours for 30 minutes at a time and then as needed for pain and swelling.  Continue to use ice on the knee for pain and swelling from surgery. You may notice swelling that will progress down to the foot and ankle.  This is normal after surgery.  Elevate the leg when you are not up walking on it.   °· Continue to use the breathing machine which will help keep your temperature down.  It is common for your temperature to cycle up and down following surgery, especially at night when you are not up moving around and exerting yourself.  The breathing machine keeps your lungs expanded and your temperature down. °· Do not place pillow under knee, focus on keeping the knee straight while resting ° °DIET °You may resume your previous home diet once your are discharged from the hospital. ° °DRESSING / WOUND CARE / SHOWERING °You may shower 3 days after surgery, but keep the wounds dry during showering.  You may use an occlusive plastic wrap (Press'n Seal for example), NO SOAKING/SUBMERGING IN THE BATHTUB.  If the  bandage gets wet, change with a clean dry gauze.  If the incision gets wet, pat the wound dry with a clean towel. °You may start showering once you are discharged home but do not submerge the incision under water. Just pat the incision dry and apply a dry gauze dressing on daily. °Change the surgical dressing daily and reapply a dry dressing each time. ° °ACTIVITY °Walk with your walker as instructed. °Use walker as long as suggested by your caregivers. °Avoid periods of inactivity such as sitting longer than an hour when not asleep. This helps prevent blood clots.  °You may resume a sexual relationship in one month or when given the OK by your doctor.  °You may return to work once you are cleared by your doctor.  °Do not drive a car for 6 weeks or until released by you surgeon.  °Do not drive while taking narcotics. ° °WEIGHT BEARING °Weight bearing as tolerated with assist device (walker, cane, etc) as directed, use it as long as suggested by your surgeon or therapist, typically at least 4-6 weeks. ° °POSTOPERATIVE CONSTIPATION PROTOCOL °Constipation - defined medically as fewer than three stools per week and severe constipation as less than one stool per week. ° °One of the most common issues patients have following surgery is constipation.  Even if you have a regular bowel pattern at home, your normal regimen is likely to be disrupted due to multiple reasons following surgery.  Combination of anesthesia, postoperative narcotics, change in appetite and fluid intake all can affect your bowels.    In order to avoid complications following surgery, here are some recommendations in order to help you during your recovery period. ° °Colace (docusate) - Pick up an over-the-counter form of Colace or another stool softener and take twice a day as long as you are requiring postoperative pain medications.  Take with a full glass of water daily.  If you experience loose stools or diarrhea, hold the colace until you stool forms  back up.  If your symptoms do not get better within 1 week or if they get worse, check with your doctor. ° °Dulcolax (bisacodyl) - Pick up over-the-counter and take as directed by the product packaging as needed to assist with the movement of your bowels.  Take with a full glass of water.  Use this product as needed if not relieved by Colace only.  ° °MiraLax (polyethylene glycol) - Pick up over-the-counter to have on hand.  MiraLax is a solution that will increase the amount of water in your bowels to assist with bowel movements.  Take as directed and can mix with a glass of water, juice, soda, coffee, or tea.  Take if you go more than two days without a movement. °Do not use MiraLax more than once per day. Call your doctor if you are still constipated or irregular after using this medication for 7 days in a row. ° °If you continue to have problems with postoperative constipation, please contact the office for further assistance and recommendations.  If you experience "the worst abdominal pain ever" or develop nausea or vomiting, please contact the office immediatly for further recommendations for treatment. ° °ITCHING ° If you experience itching with your medications, try taking only a single pain pill, or even half a pain pill at a time.  You can also use Benadryl over the counter for itching or also to help with sleep.  ° °TED HOSE STOCKINGS °Wear the elastic stockings on both legs for three weeks following surgery during the day but you may remove then at night for sleeping. ° °MEDICATIONS °See your medication summary on the “After Visit Summary” that the nursing staff will review with you prior to discharge.  You may have some home medications which will be placed on hold until you complete the course of blood thinner medication.  It is important for you to complete the blood thinner medication as prescribed by your surgeon.  Continue your approved medications as instructed at time of  discharge. ° °PRECAUTIONS °If you experience chest pain or shortness of breath - call 911 immediately for transfer to the hospital emergency department.  °If you develop a fever greater that 101 F, purulent drainage from wound, increased redness or drainage from wound, foul odor from the wound/dressing, or calf pain - CONTACT YOUR SURGEON.   °                                                °FOLLOW-UP APPOINTMENTS °Make sure you keep all of your appointments after your operation with your surgeon and caregivers. You should call the office at the above phone number and make an appointment for approximately two weeks after the date of your surgery or on the date instructed by your surgeon outlined in the "After Visit Summary". ° ° °RANGE OF MOTION AND STRENGTHENING EXERCISES  °Rehabilitation of the knee is important following a knee injury or   an operation. After just a few days of immobilization, the muscles of the thigh which control the knee become weakened and shrink (atrophy). Knee exercises are designed to build up the tone and strength of the thigh muscles and to improve knee motion. Often times heat used for twenty to thirty minutes before working out will loosen up your tissues and help with improving the range of motion but do not use heat for the first two weeks following surgery. These exercises can be done on a training (exercise) mat, on the floor, on a table or on a bed. Use what ever works the best and is most comfortable for you Knee exercises include:  °Leg Lifts - While your knee is still immobilized in a splint or cast, you can do straight leg raises. Lift the leg to 60 degrees, hold for 3 sec, and slowly lower the leg. Repeat 10-20 times 2-3 times daily. Perform this exercise against resistance later as your knee gets better.  °Quad and Hamstring Sets - Tighten up the muscle on the front of the thigh (Quad) and hold for 5-10 sec. Repeat this 10-20 times hourly. Hamstring sets are done by pushing the  foot backward against an object and holding for 5-10 sec. Repeat as with quad sets.  °· Leg Slides: Lying on your back, slowly slide your foot toward your buttocks, bending your knee up off the floor (only go as far as is comfortable). Then slowly slide your foot back down until your leg is flat on the floor again. °· Angel Wings: Lying on your back spread your legs to the side as far apart as you can without causing discomfort.  °A rehabilitation program following serious knee injuries can speed recovery and prevent re-injury in the future due to weakened muscles. Contact your doctor or a physical therapist for more information on knee rehabilitation.  ° °IF YOU ARE TRANSFERRED TO A SKILLED REHAB FACILITY °If the patient is transferred to a skilled rehab facility following release from the hospital, a list of the current medications will be sent to the facility for the patient to continue.  When discharged from the skilled rehab facility, please have the facility set up the patient's Home Health Physical Therapy prior to being released. Also, the skilled facility will be responsible for providing the patient with their medications at time of release from the facility to include their pain medication, the muscle relaxants, and their blood thinner medication. If the patient is still at the rehab facility at time of the two week follow up appointment, the skilled rehab facility will also need to assist the patient in arranging follow up appointment in our office and any transportation needs. ° °MAKE SURE YOU:  °Understand these instructions.  °Get help right away if you are not doing well or get worse.  ° ° °Pick up stool softner and laxative for home use following surgery while on pain medications. °Do not submerge incision under water. °Please use good hand washing techniques while changing dressing each day. °May shower starting three days after surgery. °Please use a clean towel to pat the incision dry following  showers. °Continue to use ice for pain and swelling after surgery. °Do not use any lotions or creams on the incision until instructed by your surgeon. ° °Take Xarelto for two and a half more weeks following discharge from the hospital, then discontinue Xarelto. °Once the patient has completed the Xarelto, they may resume the 81 mg Aspirin. ° ° ° °Information on   my medicine - XARELTO® (Rivaroxaban) ° °Why was Xarelto® prescribed for you? °Xarelto® was prescribed for you to reduce the risk of blood clots forming after orthopedic surgery. The medical term for these abnormal blood clots is venous thromboembolism (VTE). ° °What do you need to know about xarelto® ? °Take your Xarelto® ONCE DAILY at the same time every day. °You may take it either with or without food. ° °If you have difficulty swallowing the tablet whole, you may crush it and mix in applesauce just prior to taking your dose. ° °Take Xarelto® exactly as prescribed by your doctor and DO NOT stop taking Xarelto® without talking to the doctor who prescribed the medication.  Stopping without other VTE prevention medication to take the place of Xarelto® may increase your risk of developing a clot. ° °After discharge, you should have regular check-up appointments with your healthcare provider that is prescribing your Xarelto®.   ° °What do you do if you miss a dose? °If you miss a dose, take it as soon as you remember on the same day then continue your regularly scheduled once daily regimen the next day. Do not take two doses of Xarelto® on the same day.  ° °Important Safety Information °A possible side effect of Xarelto® is bleeding. You should call your healthcare provider right away if you experience any of the following: °? Bleeding from an injury or your nose that does not stop. °? Unusual colored urine (red or dark brown) or unusual colored stools (red or black). °? Unusual bruising for unknown reasons. °? A serious fall or if you hit your head (even if  there is no bleeding). ° °Some medicines may interact with Xarelto® and might increase your risk of bleeding while on Xarelto®. To help avoid this, consult your healthcare provider or pharmacist prior to using any new prescription or non-prescription medications, including herbals, vitamins, non-steroidal anti-inflammatory drugs (NSAIDs) and supplements. ° °This website has more information on Xarelto®: www.xarelto.com. ° ° ° °

## 2016-12-05 DIAGNOSIS — Z7901 Long term (current) use of anticoagulants: Secondary | ICD-10-CM | POA: Diagnosis not present

## 2016-12-05 DIAGNOSIS — Z471 Aftercare following joint replacement surgery: Secondary | ICD-10-CM | POA: Diagnosis not present

## 2016-12-05 DIAGNOSIS — M1612 Unilateral primary osteoarthritis, left hip: Secondary | ICD-10-CM | POA: Diagnosis not present

## 2016-12-05 DIAGNOSIS — I10 Essential (primary) hypertension: Secondary | ICD-10-CM | POA: Diagnosis not present

## 2016-12-05 DIAGNOSIS — J449 Chronic obstructive pulmonary disease, unspecified: Secondary | ICD-10-CM | POA: Diagnosis not present

## 2016-12-05 DIAGNOSIS — Z96651 Presence of right artificial knee joint: Secondary | ICD-10-CM | POA: Diagnosis not present

## 2016-12-05 DIAGNOSIS — Z79891 Long term (current) use of opiate analgesic: Secondary | ICD-10-CM | POA: Diagnosis not present

## 2016-12-05 DIAGNOSIS — M545 Low back pain: Secondary | ICD-10-CM | POA: Diagnosis not present

## 2016-12-05 DIAGNOSIS — M5030 Other cervical disc degeneration, unspecified cervical region: Secondary | ICD-10-CM | POA: Diagnosis not present

## 2016-12-05 DIAGNOSIS — Z87891 Personal history of nicotine dependence: Secondary | ICD-10-CM | POA: Diagnosis not present

## 2016-12-07 DIAGNOSIS — M1612 Unilateral primary osteoarthritis, left hip: Secondary | ICD-10-CM | POA: Diagnosis not present

## 2016-12-07 DIAGNOSIS — M545 Low back pain: Secondary | ICD-10-CM | POA: Diagnosis not present

## 2016-12-07 DIAGNOSIS — Z7901 Long term (current) use of anticoagulants: Secondary | ICD-10-CM | POA: Diagnosis not present

## 2016-12-07 DIAGNOSIS — Z79891 Long term (current) use of opiate analgesic: Secondary | ICD-10-CM | POA: Diagnosis not present

## 2016-12-07 DIAGNOSIS — M5030 Other cervical disc degeneration, unspecified cervical region: Secondary | ICD-10-CM | POA: Diagnosis not present

## 2016-12-07 DIAGNOSIS — I10 Essential (primary) hypertension: Secondary | ICD-10-CM | POA: Diagnosis not present

## 2016-12-07 DIAGNOSIS — J449 Chronic obstructive pulmonary disease, unspecified: Secondary | ICD-10-CM | POA: Diagnosis not present

## 2016-12-07 DIAGNOSIS — Z87891 Personal history of nicotine dependence: Secondary | ICD-10-CM | POA: Diagnosis not present

## 2016-12-07 DIAGNOSIS — Z471 Aftercare following joint replacement surgery: Secondary | ICD-10-CM | POA: Diagnosis not present

## 2016-12-07 DIAGNOSIS — Z96651 Presence of right artificial knee joint: Secondary | ICD-10-CM | POA: Diagnosis not present

## 2016-12-10 DIAGNOSIS — Z87891 Personal history of nicotine dependence: Secondary | ICD-10-CM | POA: Diagnosis not present

## 2016-12-10 DIAGNOSIS — M545 Low back pain: Secondary | ICD-10-CM | POA: Diagnosis not present

## 2016-12-10 DIAGNOSIS — Z7901 Long term (current) use of anticoagulants: Secondary | ICD-10-CM | POA: Diagnosis not present

## 2016-12-10 DIAGNOSIS — Z79891 Long term (current) use of opiate analgesic: Secondary | ICD-10-CM | POA: Diagnosis not present

## 2016-12-10 DIAGNOSIS — Z96651 Presence of right artificial knee joint: Secondary | ICD-10-CM | POA: Diagnosis not present

## 2016-12-10 DIAGNOSIS — J449 Chronic obstructive pulmonary disease, unspecified: Secondary | ICD-10-CM | POA: Diagnosis not present

## 2016-12-10 DIAGNOSIS — I10 Essential (primary) hypertension: Secondary | ICD-10-CM | POA: Diagnosis not present

## 2016-12-10 DIAGNOSIS — M1612 Unilateral primary osteoarthritis, left hip: Secondary | ICD-10-CM | POA: Diagnosis not present

## 2016-12-10 DIAGNOSIS — Z471 Aftercare following joint replacement surgery: Secondary | ICD-10-CM | POA: Diagnosis not present

## 2016-12-10 DIAGNOSIS — M5030 Other cervical disc degeneration, unspecified cervical region: Secondary | ICD-10-CM | POA: Diagnosis not present

## 2016-12-12 DIAGNOSIS — J449 Chronic obstructive pulmonary disease, unspecified: Secondary | ICD-10-CM | POA: Diagnosis not present

## 2016-12-12 DIAGNOSIS — Z471 Aftercare following joint replacement surgery: Secondary | ICD-10-CM | POA: Diagnosis not present

## 2016-12-12 DIAGNOSIS — Z87891 Personal history of nicotine dependence: Secondary | ICD-10-CM | POA: Diagnosis not present

## 2016-12-12 DIAGNOSIS — Z96651 Presence of right artificial knee joint: Secondary | ICD-10-CM | POA: Diagnosis not present

## 2016-12-12 DIAGNOSIS — Z7901 Long term (current) use of anticoagulants: Secondary | ICD-10-CM | POA: Diagnosis not present

## 2016-12-12 DIAGNOSIS — Z79891 Long term (current) use of opiate analgesic: Secondary | ICD-10-CM | POA: Diagnosis not present

## 2016-12-12 DIAGNOSIS — I10 Essential (primary) hypertension: Secondary | ICD-10-CM | POA: Diagnosis not present

## 2016-12-12 DIAGNOSIS — M5030 Other cervical disc degeneration, unspecified cervical region: Secondary | ICD-10-CM | POA: Diagnosis not present

## 2016-12-12 DIAGNOSIS — M1612 Unilateral primary osteoarthritis, left hip: Secondary | ICD-10-CM | POA: Diagnosis not present

## 2016-12-12 DIAGNOSIS — M545 Low back pain: Secondary | ICD-10-CM | POA: Diagnosis not present

## 2016-12-14 DIAGNOSIS — Z79891 Long term (current) use of opiate analgesic: Secondary | ICD-10-CM | POA: Diagnosis not present

## 2016-12-14 DIAGNOSIS — M545 Low back pain: Secondary | ICD-10-CM | POA: Diagnosis not present

## 2016-12-14 DIAGNOSIS — I10 Essential (primary) hypertension: Secondary | ICD-10-CM | POA: Diagnosis not present

## 2016-12-14 DIAGNOSIS — J449 Chronic obstructive pulmonary disease, unspecified: Secondary | ICD-10-CM | POA: Diagnosis not present

## 2016-12-14 DIAGNOSIS — M5030 Other cervical disc degeneration, unspecified cervical region: Secondary | ICD-10-CM | POA: Diagnosis not present

## 2016-12-14 DIAGNOSIS — Z87891 Personal history of nicotine dependence: Secondary | ICD-10-CM | POA: Diagnosis not present

## 2016-12-14 DIAGNOSIS — Z7901 Long term (current) use of anticoagulants: Secondary | ICD-10-CM | POA: Diagnosis not present

## 2016-12-14 DIAGNOSIS — Z96651 Presence of right artificial knee joint: Secondary | ICD-10-CM | POA: Diagnosis not present

## 2016-12-14 DIAGNOSIS — M1612 Unilateral primary osteoarthritis, left hip: Secondary | ICD-10-CM | POA: Diagnosis not present

## 2016-12-14 DIAGNOSIS — Z471 Aftercare following joint replacement surgery: Secondary | ICD-10-CM | POA: Diagnosis not present

## 2016-12-17 ENCOUNTER — Encounter (HOSPITAL_COMMUNITY): Payer: Self-pay

## 2016-12-17 ENCOUNTER — Ambulatory Visit (HOSPITAL_COMMUNITY): Payer: BLUE CROSS/BLUE SHIELD | Attending: Orthopedic Surgery

## 2016-12-17 DIAGNOSIS — M25561 Pain in right knee: Secondary | ICD-10-CM | POA: Diagnosis not present

## 2016-12-17 DIAGNOSIS — R600 Localized edema: Secondary | ICD-10-CM | POA: Insufficient documentation

## 2016-12-17 DIAGNOSIS — M6281 Muscle weakness (generalized): Secondary | ICD-10-CM | POA: Insufficient documentation

## 2016-12-17 DIAGNOSIS — R262 Difficulty in walking, not elsewhere classified: Secondary | ICD-10-CM | POA: Diagnosis not present

## 2016-12-17 DIAGNOSIS — M25661 Stiffness of right knee, not elsewhere classified: Secondary | ICD-10-CM | POA: Diagnosis not present

## 2016-12-17 NOTE — Patient Instructions (Addendum)
  Short Arc Quads (SAQs)  While lying face up, place two rolled towels under the knee to lift it about 5 inches off the mat. Bring your toes towards your head (dorsiflexion)then straighten the knee for a hold of 3-5 seconds. Slowly lower your leg back to the mat.   Perform 1-2x/day, 2-3 sets of 10 reps, holding for 5 seconds at top   Continue quad sets and heel slides.

## 2016-12-17 NOTE — Therapy (Signed)
[place], [place], [address] Phone: [phone]   Fax:  [phone]  Physical Therapy Evaluation  Patient Details  Name: Courtney Nielsen MRN: [id number] Date of Birth: 09/27/1955 Referring Provider: Gaynelle Arabian, MD  Encounter Date: 12/17/2016      PT End of Session - 12/17/16 1642    Visit Number 1   Number of Visits 19   Date for PT Re-Evaluation 01/07/17   Authorization Type [employer] Other   Authorization Time Period 12/17/16 to 01/28/17   PT Start Time 8416   PT Stop Time 6063   PT Time Calculation (min) 43 min   Activity Tolerance Patient tolerated treatment well;No increased pain   Behavior During Therapy WFL for tasks assessed/performed      Past Medical History:  Diagnosis Date  . Allergic rhinitis   . Arthritis   . Chest pain   . Colonic polyp   . COPD (chronic obstructive pulmonary disease) ([place])    patient denies at preop on ;   . Dysrhythmia    hx of every third beat is " off" per patient   . Hypertension   . Pneumonia    hx of 2014   . Smoker   . Vitamin B12 deficiency     Past Surgical History:  Procedure Laterality Date  . CARDIAC CATHETERIZATION     cath negative per patient   . CATARACT EXTRACTION W/PHACO Right 06/11/2016   Procedure: CATARACT EXTRACTION PHACO AND INTRAOCULAR LENS PLACEMENT RIGHT EYE CDE=6.15;  Surgeon: Tonny Branch, MD;  Location: [facility];  Service: Ophthalmology;  Laterality: Right;  right  . COLONOSCOPY    . KNEE ARTHROSCOPY Right 10/19/2015   Procedure: RIGHT KNEE SCOPE WITH MENISCAL DEBRIDEMENT,LATERAL SYNOVECTOMY;  Surgeon: Gaynelle Arabian, MD;  Location: [facility];  Service: Orthopedics;  Laterality: Right;  . TOTAL KNEE ARTHROPLASTY Right 12/03/2016   Procedure: RIGHT TOTAL KNEE ARTHROPLASTY;  Surgeon: Gaynelle Arabian, MD;  Location: [facility];  Service: Orthopedics;  Laterality: Right;    There were no vitals filed for this visit.       Subjective Assessment -  12/17/16 1555    Subjective Pt states she underwent a R TKA on 12/03/16 with Dr. Wynelle Link. She started having R kneep problems in January 2017; Dr. Wynelle Link did a R knee scope to fix her meniscus which did not help. She stated becuase her knee did not improve, she underwent the TKA. She states she was fully dc'd from [place] on Friday 6/15. She states she did not require an AD prior to her surgery but is currently using a RW. She states she is having the most difficulty bending her knee.    Pertinent History Rt knee scope 2017, GERD, HTN, COPD   Limitations Walking;House hold activities;Standing   How long can you sit comfortably? no issues   How long can you stand comfortably? hasn't really tried   How long can you walk comfortably? she's been getting up every hour and walking a few laps up and down her hall   Patient Stated Goals ROM, strengthening   Currently in Pain? Yes   Pain Score 3    Pain Location Knee   Pain Orientation Right   Pain Descriptors / Indicators Aching;Dull   Pain Type Surgical pain   Pain Onset 1 to 4 weeks ago   Pain Frequency Constant   Aggravating Factors  bending it, doing her HHPT exercises   Pain Relieving Factors ice, rest  Effect of Pain on Daily Activities increases            [facility] PT Assessment - 12/17/16 0001      Assessment   Medical Diagnosis primary OA of R knee, s/p R TKA   Referring Provider Gaynelle Arabian, MD   Onset Date/Surgical Date 12/03/16   Next MD Visit 12/18/16   Prior Therapy LBP at this clinic earlier this year     Balance Screen   Has the patient fallen in the past 6 months No   Has the patient had a decrease in activity level because of a fear of falling?  No   Is the patient reluctant to leave their home because of a fear of falling?  No     Home Ecologist residence     Prior Function   Level of Independence Independent   Vocation Full time employment   Vocation Requirements sitting at a desk       Cognition   Overall Cognitive Status Within Functional Limits for tasks assessed     Observation/Other Assessments-Edema    Edema Circumferential     Circumferential Edema   Circumferential - Right 17.5"   joint line and over bandage   Circumferential - Left  15.5"  joint line     AROM   Right Knee Extension 17   Right Knee Flexion 87   Left Knee Flexion --     Strength   Right Hip Flexion 4/5   Right Hip Extension 4+/5   Right Hip ABduction 4+/5   Left Hip Flexion 4+/5   Left Hip Extension 4+/5   Left Hip ABduction 4+/5   Right Knee Flexion 5/5  through available range   Right Knee Extension 4-/5  through available range   Left Knee Flexion 5/5   Left Knee Extension 5/5   Right Ankle Dorsiflexion 4+/5   Left Ankle Dorsiflexion 5/5     Palpation   Palpation comment increased soft tissue restrictions and tenderness to palpation of R distal quad, medial joint line     Ambulation/Gait   Ambulation Distance (Feet) 476 Feet  3MWT   Assistive device Rolling walker   Gait Pattern Step-through pattern;Decreased stance time - right;Decreased step length - left;Decreased stride length;Antalgic;Trendelenburg;Trunk flexed  decr R knee ext     Balance   Balance Assessed Yes     Static Standing Balance   Static Standing - Balance Support No upper extremity supported   Static Standing Balance -  Activities  Single Leg Stance - Right Leg;Single Leg Stance - Left Leg   Static Standing - Comment/# of Minutes L and R: 30 seconds each but she required intermittent UE support throughout, R>L     Standardized Balance Assessment   Standardized Balance Assessment Five Times Sit to Stand;Timed Up and Go Test   Five times sit to stand comments  12 sec from chair with BUE support, RLE extended throughout     Timed Up and Go Test   Normal TUG (seconds) 12.7  RW       Objective measurements completed on examination: See above findings.         PT Education - 12/17/16 1638     Education provided Yes   Education Details exam findings, POC, HEP   Person(s) Educated Patient   Methods Explanation;Demonstration;Handout   Comprehension Verbalized understanding;Returned demonstration          PT Short Term Goals - 12/17/16 1655  PT SHORT TERM GOAL #1   Title Pt will consistently and independently perform HEP to maximize return to PLOF.   Time 3   Period Weeks   Status New     PT SHORT TERM GOAL #2   Title Pt will have improved AROM from 5-100 in order to maximize gait and stair ambulation.    Time 3   Period Weeks   Status New     PT SHORT TERM GOAL #3   Title Pt will have improved SLS to 30 sec or > without requiring UE support in order to demonstrate improved balance and to maximize gait.   Time 3   Period Weeks   Status New           PT Long Term Goals - 12/17/16 1657      PT LONG TERM GOAL #1   Title Pt will have improved BLE MMT to 5/5 in order to maximize gait and functional tasks.   Time 6   Period Weeks   Status New     PT LONG TERM GOAL #2   Title Pt will perform the 5xSTS in 10 sec or < with no hands and with proper mechanics in order to demonstrate improved overall BLE strength and power.   Time 6   Period Weeks   Status New     PT LONG TERM GOAL #3   Title Pt will perform the TUG with LRAD in 10 sec or < with LRAD in order to demonstrate improved gait and balance in order to promote participation in community events.   Time 6   Period Weeks   Status New     PT LONG TERM GOAL #4   Title Pt will have improved 3MWT to at least 634ft with LRAD, proper gait mechanics, and with minimal to no pain  to demo improved BLE strength, endurance, and overall function.   Time 6   Period Weeks   Status New     PT LONG TERM GOAL #5   Title Pt will have improved AROM from 0-115 deg in order to maximize gait and overall function.   Time 6   Period Weeks   Status New                Plan - 12/17/16 1643    Clinical  Impression Statement Pt is a pleasant 61 YO F who presents to [facility] s/p R TKA on 12/03/16. Pt has deficits in ROM, MMT, balance, and gait, as well we mild increased swelling, as illustrated above. These deficits are impacting pt's ability to function at home and in the community and she needs skilled PT intervention in order to maximize her return to PLOF.    History and Personal Factors relevant to plan of care: h/o LBP, young, motivated to participate with PT   Clinical Presentation Stable   Clinical Presentation due to: decreased AROM, MMT, balance and gait   Clinical Decision Making Low   Rehab Potential Good   PT Frequency 3x / week   PT Duration 6 weeks   PT Treatment/Interventions ADLs/Self Care Home Management;Cryotherapy;Electrical Stimulation;DME Instruction;Gait training;Stair training;Functional mobility training;Therapeutic activities;Therapeutic exercise;Balance training;Neuromuscular re-education;Patient/family education;Manual techniques;Passive range of motion;Dry needling   PT Next Visit Plan eval: quad sets, heel slides, SAQ   PT Home Exercise Plan review goals and HEP; remeasure circumferential edema at joint line, manual for soft tissue restrictions and edema, quad sets, heel slides, prone quad stretch with rope, HS stretch, calf stretch, knee drives  on step, rockerboard   Consulted and Agree with Plan of Care Patient      Patient will benefit from skilled therapeutic intervention in order to improve the following deficits and impairments:  Abnormal gait, Decreased activity tolerance, Decreased balance, Decreased endurance, Decreased range of motion, Decreased strength, Difficulty walking, Hypomobility, Increased edema, Increased muscle spasms, Pain  Visit Diagnosis: Acute pain of right knee - Plan: PT plan of care cert/re-cert  Stiffness of right knee, not elsewhere classified - Plan: PT plan of care cert/re-cert  Difficulty in walking, not elsewhere classified - Plan: PT  plan of care cert/re-cert  Muscle weakness (generalized) - Plan: PT plan of care cert/re-cert  Localized edema - Plan: PT plan of care cert/re-cert     Problem List Patient Active Problem List   Diagnosis Date Noted  . Primary osteoarthritis of right knee 12/04/2016  . OA (osteoarthritis) of knee 12/03/2016  . LLQ abdominal pain 03/15/2016  . Lateral meniscal tear 10/19/2015  . Left hip pain 07/22/2013  . Occipital neuralgia 06/04/2013  . Leg pain 02/22/2012  . Gout 02/22/2012  . Weight gain 01/11/2012  . Edema leg 06/07/2011  . Pruritus 04/23/2011  . Preop exam for internal medicine 04/23/2011  . Pneumonia, organism unspecified(486) 04/21/2011  . Cough 04/19/2011  . Paresthesia 09/26/2010  . NECK PAIN 08/22/2010  . BACK PAIN, THORACIC REGION 08/22/2010  . Low back pain 08/22/2010  . DIZZINESS 08/22/2010  . Cerumen impaction 07/21/2010  . CRAMP OF LIMB 03/16/2010  . BRONCHITIS, ACUTE 07/22/2009  . COPD 07/22/2009  . MEDIASTINAL LYMPHADENOPATHY 08/16/2008  . Nonspecific (abnormal) findings on radiological and other examination of body structure 08/16/2008  . CT, CHEST, ABNORMAL 08/16/2008  . CHEST PAIN 06/24/2008  . BRADYCARDIA 06/23/2008  . GERD 06/23/2008  . SYNCOPE 06/23/2008  . B12 deficiency 04/09/2008  . Pain in Soft Tissues of Limb 01/08/2008  . RASH AND OTHER NONSPECIFIC SKIN ERUPTION 01/08/2008  . SINUSITIS, ACUTE 10/31/2007  . HIP PAIN 10/31/2007  . TOBACCO USE DISORDER/SMOKER-SMOKING CESSATION DISCUSSED 04/21/2007  . Essential hypertension 04/21/2007  . MENOPAUSAL SYNDROME 04/21/2007  . ALLERGIC RHINITIS 04/19/2007  . COLONIC POLYPS, HX OF 04/19/2007     Courtney Nielsen PT, DPT   [facility] [address] [place], [place], [address] Phone: [phone]   Fax:  [phone]  Name: GILBERT NARAIN MRN: [id number] Date of Birth: [birth year]

## 2016-12-19 ENCOUNTER — Ambulatory Visit (HOSPITAL_COMMUNITY): Payer: BLUE CROSS/BLUE SHIELD | Admitting: Physical Therapy

## 2016-12-19 DIAGNOSIS — M6281 Muscle weakness (generalized): Secondary | ICD-10-CM

## 2016-12-19 DIAGNOSIS — M25561 Pain in right knee: Secondary | ICD-10-CM | POA: Diagnosis not present

## 2016-12-19 DIAGNOSIS — R262 Difficulty in walking, not elsewhere classified: Secondary | ICD-10-CM | POA: Diagnosis not present

## 2016-12-19 DIAGNOSIS — M25661 Stiffness of right knee, not elsewhere classified: Secondary | ICD-10-CM

## 2016-12-19 DIAGNOSIS — R600 Localized edema: Secondary | ICD-10-CM | POA: Diagnosis not present

## 2016-12-19 NOTE — Therapy (Signed)
[place], [place], [address] Phone: [phone]   Fax:  [phone]  Physical Therapy Treatment  Patient Details  Name: Courtney Nielsen MRN: [id number] Date of Birth: 04/02/1956 Referring Provider: Gaynelle Arabian, MD  Encounter Date: 12/19/2016      PT End of Session - 12/19/16 1551    Visit Number 2   Number of Visits 19   Date for PT Re-Evaluation 01/07/17   Authorization Type BC[employer] Other   Authorization Time Period 12/17/16 to 01/28/17   Authorization - Visit Number 2   Authorization - Number of Visits 10   PT Start Time 7169   PT Stop Time 1558   PT Time Calculation (min) 43 min   Activity Tolerance Patient tolerated treatment well;No increased pain   Behavior During Therapy WFL for tasks assessed/performed      Past Medical History:  Diagnosis Date  . Allergic rhinitis   . Arthritis   . Chest pain   . Colonic polyp   . COPD (chronic obstructive pulmonary disease) ([facility])    patient denies at preop on ;   . Dysrhythmia    hx of every third beat is " off" per patient   . Hypertension   . Pneumonia    hx of 2014   . Smoker   . Vitamin B12 deficiency     Past Surgical History:  Procedure Laterality Date  . CARDIAC CATHETERIZATION     cath negative per patient   . CATARACT EXTRACTION W/PHACO Right 06/11/2016   Procedure: CATARACT EXTRACTION PHACO AND INTRAOCULAR LENS PLACEMENT RIGHT EYE CDE=6.15;  Surgeon: Tonny Branch, MD;  Location: [facility];  Service: Ophthalmology;  Laterality: Right;  right  . COLONOSCOPY    . KNEE ARTHROSCOPY Right 10/19/2015   Procedure: RIGHT KNEE SCOPE WITH MENISCAL DEBRIDEMENT,LATERAL SYNOVECTOMY;  Surgeon: Gaynelle Arabian, MD;  Location: [facility];  Service: Orthopedics;  Laterality: Right;  . TOTAL KNEE ARTHROPLASTY Right 12/03/2016   Procedure: RIGHT TOTAL KNEE ARTHROPLASTY;  Surgeon: Gaynelle Arabian, MD;  Location: [facility];  Service: Orthopedics;  Laterality: Right;    There  were no vitals filed for this visit.      Subjective Assessment - 12/19/16 1515    Subjective Pt states that she is only icing after therapy    Pertinent History Rt knee scope 2017, GERD, HTN, COPD   Limitations Walking;House hold activities;Standing   How long can you sit comfortably? no issues   How long can you stand comfortably? hasn't really tried   How long can you walk comfortably? she's been getting up every hour and walking a few laps up and down her hall   Patient Stated Goals ROM, strengthening   Pain Score 3    Pain Location Knee   Pain Orientation Right   Pain Onset 1 to 4 weeks ago   Pain Frequency Intermittent            [facility] PT Assessment - 12/19/16 0001      Circumferential Edema   Circumferential - Right 17.5   Circumferential - Left  15.5                     [facility] Adult PT Treatment/Exercise - 12/19/16 0001      Exercises   Exercises Knee/Hip     Knee/Hip Exercises: Stretches   Active Hamstring Stretch Right;3 reps;10 seconds     Knee/Hip Exercises: Standing   Heel Raises Both;10 reps  Knee Flexion Right;10 reps     Knee/Hip Exercises: Supine   Quad Sets Right;10 reps   Heel Slides Right;10 reps   Terminal Knee Extension AAROM;Right;5 reps   Knee Extension Limitations 14 was 17   Knee Flexion Limitations 90 was 87   Other Supine Knee/Hip Exercises ankle pumps x 10      Knee/Hip Exercises: Prone   Hamstring Curl 10 reps     Manual Therapy   Manual Therapy Edema management   Manual therapy comments done seperate from all other aspects of treatment.   Edema Management decongestive techniques used on entire Rt LE to decease edema.                   PT Short Term Goals - 12/19/16 1611      PT SHORT TERM GOAL #1   Title Pt will consistently and independently perform HEP to maximize return to PLOF.   Time 3   Period Weeks   Status On-going     PT SHORT TERM GOAL #2   Title Pt will have improved AROM from 5-100  in order to maximize gait and stair ambulation.    Time 3   Period Weeks   Status On-going     PT SHORT TERM GOAL #3   Title Pt will have improved SLS to 30 sec or > without requiring UE support in order to demonstrate improved balance and to maximize gait.   Time 3   Period Weeks   Status On-going           PT Long Term Goals - 12/19/16 1611      PT LONG TERM GOAL #1   Title Pt will have improved BLE MMT to 5/5 in order to maximize gait and functional tasks.   Time 6   Period Weeks   Status On-going     PT LONG TERM GOAL #2   Title Pt will perform the 5xSTS in 10 sec or < with no hands and with proper mechanics in order to demonstrate improved overall BLE strength and power.   Time 6   Period Weeks   Status On-going     PT LONG TERM GOAL #3   Title Pt will perform the TUG with LRAD in 10 sec or < with LRAD in order to demonstrate improved gait and balance in order to promote participation in community events.   Time 6   Period Weeks   Status On-going     PT LONG TERM GOAL #4   Title Pt will have improved 3MWT to at least 655ft with LRAD, proper gait mechanics, and with minimal to no pain  to demo improved BLE strength, endurance, and overall function.   Time 6   Period Weeks   Status On-going     PT LONG TERM GOAL #5   Title Pt will have improved AROM from 0-115 deg in order to maximize gait and overall function.   Time 6   Period Weeks   Status On-going               Plan - 12/19/16 1552    Clinical Impression Statement Pt edema remains the same; encouraged pt to be icing at least 2 times a day.  Pt began Terminal extension unable to complete I needed moderate assist.  Pt had high pain with prone knee flexion therefore will hold quad stretch for next treatment.     Rehab Potential Good   PT Frequency 3x / week  PT Duration 6 weeks   PT Treatment/Interventions ADLs/Self Care Home Management;Cryotherapy;Electrical Stimulation;DME Instruction;Gait  training;Stair training;Functional mobility training;Therapeutic activities;Therapeutic exercise;Balance training;Neuromuscular re-education;Patient/family education;Manual techniques;Passive range of motion;Dry needling   PT Next Visit Plan eval: quad sets, heel slides, SAQ   PT Home Exercise Plan Continue with manual for soft tissue restrictions and edema, begin prone quad stretch with rope, , calf stretch, knee drives on step, rockerboard as tolerated    Consulted and Agree with Plan of Care Patient      Patient will benefit from skilled therapeutic intervention in order to improve the following deficits and impairments:  Abnormal gait, Decreased activity tolerance, Decreased balance, Decreased endurance, Decreased range of motion, Decreased strength, Difficulty walking, Hypomobility, Increased edema, Increased muscle spasms, Pain  Visit Diagnosis: Acute pain of right knee  Stiffness of right knee, not elsewhere classified  Difficulty in walking, not elsewhere classified  Muscle weakness (generalized)  Localized edema     Problem List Patient Active Problem List   Diagnosis Date Noted  . Primary osteoarthritis of right knee 12/04/2016  . OA (osteoarthritis) of knee 12/03/2016  . LLQ abdominal pain 03/15/2016  . Lateral meniscal tear 10/19/2015  . Left hip pain 07/22/2013  . Occipital neuralgia 06/04/2013  . Leg pain 02/22/2012  . Gout 02/22/2012  . Weight gain 01/11/2012  . Edema leg 06/07/2011  . Pruritus 04/23/2011  . Preop exam for internal medicine 04/23/2011  . Pneumonia, organism unspecified(486) 04/21/2011  . Cough 04/19/2011  . Paresthesia 09/26/2010  . NECK PAIN 08/22/2010  . BACK PAIN, THORACIC REGION 08/22/2010  . Low back pain 08/22/2010  . DIZZINESS 08/22/2010  . Cerumen impaction 07/21/2010  . CRAMP OF LIMB 03/16/2010  . BRONCHITIS, ACUTE 07/22/2009  . COPD 07/22/2009  . MEDIASTINAL LYMPHADENOPATHY 08/16/2008  . Nonspecific (abnormal) findings on  radiological and other examination of body structure 08/16/2008  . CT, CHEST, ABNORMAL 08/16/2008  . CHEST PAIN 06/24/2008  . BRADYCARDIA 06/23/2008  . GERD 06/23/2008  . SYNCOPE 06/23/2008  . B12 deficiency 04/09/2008  . Pain in Soft Tissues of Limb 01/08/2008  . RASH AND OTHER NONSPECIFIC SKIN ERUPTION 01/08/2008  . SINUSITIS, ACUTE 10/31/2007  . HIP PAIN 10/31/2007  . TOBACCO USE DISORDER/SMOKER-SMOKING CESSATION DISCUSSED 04/21/2007  . Essential hypertension 04/21/2007  . MENOPAUSAL SYNDROME 04/21/2007  . ALLERGIC RHINITIS 04/19/2007  . COLONIC POLYPS, HX OF 04/19/2007    Rayetta Humphrey, PT CLT [phone] 12/19/2016, 4:12 PM  [facility] [address] [place], [place], [address] Phone: [phone]   Fax:  [phone]  Name: Courtney Nielsen MRN: [id number] Date of Birth: [birth year]

## 2016-12-21 ENCOUNTER — Ambulatory Visit (HOSPITAL_COMMUNITY): Payer: BLUE CROSS/BLUE SHIELD

## 2016-12-21 DIAGNOSIS — M6281 Muscle weakness (generalized): Secondary | ICD-10-CM | POA: Diagnosis not present

## 2016-12-21 DIAGNOSIS — R262 Difficulty in walking, not elsewhere classified: Secondary | ICD-10-CM

## 2016-12-21 DIAGNOSIS — M25561 Pain in right knee: Secondary | ICD-10-CM | POA: Diagnosis not present

## 2016-12-21 DIAGNOSIS — R600 Localized edema: Secondary | ICD-10-CM

## 2016-12-21 DIAGNOSIS — M25661 Stiffness of right knee, not elsewhere classified: Secondary | ICD-10-CM | POA: Diagnosis not present

## 2016-12-21 NOTE — Therapy (Signed)
[place], [place], [address] Phone: [phone]   Fax:  [phone]  Physical Therapy Treatment  Patient Details  Name: Courtney Nielsen MRN: [id number] Date of Birth: 1955/12/31 Referring Provider: Gaynelle Arabian, MD  Encounter Date: 12/21/2016      PT End of Session - 12/21/16 1741    Visit Number 3   Number of Visits 19   Date for PT Re-Evaluation 01/07/17   Authorization Type BC[employer] Other   Authorization Time Period 12/17/16 to 01/28/17   Authorization - Visit Number 3   Authorization - Number of Visits 10   PT Start Time 7262   PT Stop Time 1828   PT Time Calculation (min) 50 min   Activity Tolerance Patient tolerated treatment well   Behavior During Therapy [facility] for tasks assessed/performed      Past Medical History:  Diagnosis Date  . Allergic rhinitis   . Arthritis   . Chest pain   . Colonic polyp   . COPD (chronic obstructive pulmonary disease) ([facility])    patient denies at preop on ;   . Dysrhythmia    hx of every third beat is " off" per patient   . Hypertension   . Pneumonia    hx of 2014   . Smoker   . Vitamin B12 deficiency     Past Surgical History:  Procedure Laterality Date  . CARDIAC CATHETERIZATION     cath negative per patient   . CATARACT EXTRACTION W/PHACO Right 06/11/2016   Procedure: CATARACT EXTRACTION PHACO AND INTRAOCULAR LENS PLACEMENT RIGHT EYE CDE=6.15;  Surgeon: Tonny Branch, MD;  Location: [facility];  Service: Ophthalmology;  Laterality: Right;  right  . COLONOSCOPY    . KNEE ARTHROSCOPY Right 10/19/2015   Procedure: RIGHT KNEE SCOPE WITH MENISCAL DEBRIDEMENT,LATERAL SYNOVECTOMY;  Surgeon: Gaynelle Arabian, MD;  Location: [facility];  Service: Orthopedics;  Laterality: Right;  . TOTAL KNEE ARTHROPLASTY Right 12/03/2016   Procedure: RIGHT TOTAL KNEE ARTHROPLASTY;  Surgeon: Gaynelle Arabian, MD;  Location: [facility];  Service: Orthopedics;  Laterality: Right;    There were no vitals filed  for this visit.      Subjective Assessment - 12/21/16 1740    Subjective Pt stated she has began icing 3times a day, current pain scale 3/10.   Pertinent History Rt knee scope 2017, GERD, HTN, COPD   Patient Stated Goals ROM, strengthening   Currently in Pain? Yes   Pain Score 3    Pain Location Knee   Pain Orientation Right   Pain Descriptors / Indicators Tightness;Aching   Pain Type Surgical pain   Pain Onset 1 to 4 weeks ago   Pain Frequency Intermittent   Aggravating Factors  bending it, doing her HHPT exercises   Pain Relieving Factors ice, rest   Effect of Pain on Daily Activities increases             [facility] Adult PT Treatment/Exercise - 12/21/16 0001      Knee/Hip Exercises: Stretches   Active Hamstring Stretch Right;3 reps;30 seconds   Active Hamstring Stretch Limitations supine wiht sheet   Knee: Self-Stretch to increase Flexion 10 seconds   Knee: Self-Stretch Limitations 10x 10" hold on 12in step   Gastroc Stretch 3 reps;30 seconds   Gastroc Stretch Limitations slant board     Knee/Hip Exercises: Standing   Rocker Board 2 minutes   Rocker Board Limitations R/L and Df/PF     Knee/Hip Exercises: Supine  Quad Sets Right;10 reps   Heel Slides Right;10 reps;AROM   Terminal Knee Extension AAROM;Right;10 reps   Knee Extension Limitations 13 degrees lacking   Knee Flexion Limitations 98 degrees flexion     Manual Therapy   Manual Therapy Edema management   Manual therapy comments done seperate from all other aspects of treatment.   Edema Management retro massage with LE elevated                   PT Short Term Goals - 12/19/16 1611      PT SHORT TERM GOAL #1   Title Pt will consistently and independently perform HEP to maximize return to PLOF.   Time 3   Period Weeks   Status On-going     PT SHORT TERM GOAL #2   Title Pt will have improved AROM from 5-100 in order to maximize gait and stair ambulation.    Time 3   Period Weeks   Status  On-going     PT SHORT TERM GOAL #3   Title Pt will have improved SLS to 30 sec or > without requiring UE support in order to demonstrate improved balance and to maximize gait.   Time 3   Period Weeks   Status On-going           PT Long Term Goals - 12/19/16 1611      PT LONG TERM GOAL #1   Title Pt will have improved BLE MMT to 5/5 in order to maximize gait and functional tasks.   Time 6   Period Weeks   Status On-going     PT LONG TERM GOAL #2   Title Pt will perform the 5xSTS in 10 sec or < with no hands and with proper mechanics in order to demonstrate improved overall BLE strength and power.   Time 6   Period Weeks   Status On-going     PT LONG TERM GOAL #3   Title Pt will perform the TUG with LRAD in 10 sec or < with LRAD in order to demonstrate improved gait and balance in order to promote participation in community events.   Time 6   Period Weeks   Status On-going     PT LONG TERM GOAL #4   Title Pt will have improved 3MWT to at least 650ft with LRAD, proper gait mechanics, and with minimal to no pain  to demo improved BLE strength, endurance, and overall function.   Time 6   Period Weeks   Status On-going     PT LONG TERM GOAL #5   Title Pt will have improved AROM from 0-115 deg in order to maximize gait and overall function.   Time 6   Period Weeks   Status On-going               Plan - 12/21/16 1759    Clinical Impression Statement Edema present at entrance, began session with retro massage for edema control.  Reviewed benefits of ice application, pt reports increased to 3 times today.  Session focus on ROM based therex with additional stretches to improve mobility.  Added rocker board to improve stance phase wiht gait.  Pt able to demonstrate appropriate form and technqieus with minimal cueing required through session.  AROM improved to 13-98 degrees (was 14-90 last session.  Did c/o increased pain to 4-5/10 at EOS, encouraged to apply ice for pain  and edema control.     Rehab Potential Good  PT Frequency 3x / week   PT Duration 6 weeks   PT Treatment/Interventions ADLs/Self Care Home Management;Cryotherapy;Electrical Stimulation;DME Instruction;Gait training;Stair training;Functional mobility training;Therapeutic activities;Therapeutic exercise;Balance training;Neuromuscular re-education;Patient/family education;Manual techniques;Passive range of motion;Dry needling   PT Next Visit Plan Continue with manual for soft tissue restrictions and edema (circumferential measurements).  Next session begin prone quad stretch with rope and standing TKE  Resume standing heel/toe raises and hamstring curls as time allows.     PT Home Exercise Plan eval: quad sets, heel slides, SAQ      Patient will benefit from skilled therapeutic intervention in order to improve the following deficits and impairments:  Abnormal gait, Decreased activity tolerance, Decreased balance, Decreased endurance, Decreased range of motion, Decreased strength, Difficulty walking, Hypomobility, Increased edema, Increased muscle spasms, Pain  Visit Diagnosis: Acute pain of right knee  Stiffness of right knee, not elsewhere classified  Difficulty in walking, not elsewhere classified  Muscle weakness (generalized)  Localized edema     Problem List Patient Active Problem List   Diagnosis Date Noted  . Primary osteoarthritis of right knee 12/04/2016  . OA (osteoarthritis) of knee 12/03/2016  . LLQ abdominal pain 03/15/2016  . Lateral meniscal tear 10/19/2015  . Left hip pain 07/22/2013  . Occipital neuralgia 06/04/2013  . Leg pain 02/22/2012  . Gout 02/22/2012  . Weight gain 01/11/2012  . Edema leg 06/07/2011  . Pruritus 04/23/2011  . Preop exam for internal medicine 04/23/2011  . Pneumonia, organism unspecified(486) 04/21/2011  . Cough 04/19/2011  . Paresthesia 09/26/2010  . NECK PAIN 08/22/2010  . BACK PAIN, THORACIC REGION 08/22/2010  . Low back pain  08/22/2010  . DIZZINESS 08/22/2010  . Cerumen impaction 07/21/2010  . CRAMP OF LIMB 03/16/2010  . BRONCHITIS, ACUTE 07/22/2009  . COPD 07/22/2009  . MEDIASTINAL LYMPHADENOPATHY 08/16/2008  . Nonspecific (abnormal) findings on radiological and other examination of body structure 08/16/2008  . CT, CHEST, ABNORMAL 08/16/2008  . CHEST PAIN 06/24/2008  . BRADYCARDIA 06/23/2008  . GERD 06/23/2008  . SYNCOPE 06/23/2008  . B12 deficiency 04/09/2008  . Pain in Soft Tissues of Limb 01/08/2008  . RASH AND OTHER NONSPECIFIC SKIN ERUPTION 01/08/2008  . SINUSITIS, ACUTE 10/31/2007  . HIP PAIN 10/31/2007  . TOBACCO USE DISORDER/SMOKER-SMOKING CESSATION DISCUSSED 04/21/2007  . Essential hypertension 04/21/2007  . MENOPAUSAL SYNDROME 04/21/2007  . ALLERGIC RHINITIS 04/19/2007  . COLONIC POLYPS, HX OF 04/19/2007   Ihor Austin, LPTA; Lolo  Aldona Lento 12/21/2016, 6:38 PM  [place], [place], [address] Phone: [phone]   Fax:  [phone]  Name: Courtney Nielsen MRN: [id number] Date of Birth: 13-Jul-1955

## 2016-12-24 ENCOUNTER — Ambulatory Visit (HOSPITAL_COMMUNITY): Payer: BLUE CROSS/BLUE SHIELD | Admitting: Physical Therapy

## 2016-12-24 DIAGNOSIS — R262 Difficulty in walking, not elsewhere classified: Secondary | ICD-10-CM

## 2016-12-24 DIAGNOSIS — M25561 Pain in right knee: Secondary | ICD-10-CM

## 2016-12-24 DIAGNOSIS — M6281 Muscle weakness (generalized): Secondary | ICD-10-CM | POA: Diagnosis not present

## 2016-12-24 DIAGNOSIS — R600 Localized edema: Secondary | ICD-10-CM | POA: Diagnosis not present

## 2016-12-24 DIAGNOSIS — M25661 Stiffness of right knee, not elsewhere classified: Secondary | ICD-10-CM

## 2016-12-24 NOTE — Therapy (Signed)
[place], [place], [address] Phone: [phone]   Fax:  [phone]  Physical Therapy Treatment  Patient Details  Name: Courtney Nielsen MRN: [id number] Date of Birth: [birth year] Referring Provider: Gaynelle Arabian, MD  Encounter Date: 12/24/2016      PT End of Session - 12/24/16 1836    Visit Number 4   Number of Visits 19   Date for PT Re-Evaluation 01/07/17   Authorization Type BCBS Other   Authorization Time Period 12/17/16 to 01/28/17   Authorization - Visit Number 4   Authorization - Number of Visits 10   PT Start Time 1610   PT Stop Time 1655   PT Time Calculation (min) 45 min   Activity Tolerance Patient tolerated treatment well   Behavior During Therapy [facility] for tasks assessed/performed      Past Medical History:  Diagnosis Date  . Allergic rhinitis   . Arthritis   . Chest pain   . Colonic polyp   . COPD (chronic obstructive pulmonary disease) ([place])    patient denies at preop on ;   . Dysrhythmia    hx of every third beat is " off" per patient   . Hypertension   . Pneumonia    hx of 2014   . Smoker   . Vitamin B12 deficiency     Past Surgical History:  Procedure Laterality Date  . CARDIAC CATHETERIZATION     cath negative per patient   . CATARACT EXTRACTION W/PHACO Right 06/11/2016   Procedure: CATARACT EXTRACTION PHACO AND INTRAOCULAR LENS PLACEMENT RIGHT EYE CDE=6.15;  Surgeon: Tonny Branch, MD;  Location: [facility];  Service: Ophthalmology;  Laterality: Right;  right  . COLONOSCOPY    . KNEE ARTHROSCOPY Right 10/19/2015   Procedure: RIGHT KNEE SCOPE WITH MENISCAL DEBRIDEMENT,LATERAL SYNOVECTOMY;  Surgeon: Gaynelle Arabian, MD;  Location: [facility];  Service: Orthopedics;  Laterality: Right;  . TOTAL KNEE ARTHROPLASTY Right 12/03/2016   Procedure: RIGHT TOTAL KNEE ARTHROPLASTY;  Surgeon: Gaynelle Arabian, MD;  Location: [facility];  Service: Orthopedics;  Laterality: Right;    There were no vitals filed  for this visit.      Subjective Assessment - 12/24/16 1618    Pain Location Knee   Pain Orientation Right   Pain Descriptors / Indicators Aching;Tightness   Pain Type Surgical pain                         [facility] Adult PT Treatment/Exercise - 12/24/16 0001      Knee/Hip Exercises: Stretches   Active Hamstring Stretch Right;3 reps;30 seconds   Active Hamstring Stretch Limitations standing 12"    Knee: Self-Stretch to increase Flexion 10 seconds   Knee: Self-Stretch Limitations 10x 10" hold on 12in step   Gastroc Stretch 3 reps;30 seconds   Gastroc Stretch Limitations slant board     Knee/Hip Exercises: Standing   Heel Raises Both;15 reps   Knee Flexion Both;15 reps   Rocker Board 2 minutes   Rocker Board Limitations R/L and Df/PF     Knee/Hip Exercises: Supine   Quad Sets Right;10 reps   Heel Slides Right;10 reps;AROM   Knee Extension Limitations 13 degrees lacking   Knee Flexion Limitations 90 degrees flexion     Knee/Hip Exercises: Prone   Hamstring Curl 10 reps     Manual Therapy   Manual Therapy Myofascial release;Edema management   Manual therapy comments done seperate from all other aspects  of treatment.   Edema Management retro massage with LE elevated    Myofascial Release to decreasse adhesions and scar tissue to increase ROM                PT Education - 12/24/16 1838    Education provided Yes   Education Details Educated on difference between compression stockings and TED hose and how they are beneficial.   Person(s) Educated Patient   Methods Explanation   Comprehension Verbalized understanding          PT Short Term Goals - 12/19/16 1611      PT SHORT TERM GOAL #1   Title Pt will consistently and independently perform HEP to maximize return to PLOF.   Time 3   Period Weeks   Status On-going     PT SHORT TERM GOAL #2   Title Pt will have improved AROM from 5-100 in order to maximize gait and stair ambulation.    Time 3    Period Weeks   Status On-going     PT SHORT TERM GOAL #3   Title Pt will have improved SLS to 30 sec or > without requiring UE support in order to demonstrate improved balance and to maximize gait.   Time 3   Period Weeks   Status On-going           PT Long Term Goals - 12/19/16 1611      PT LONG TERM GOAL #1   Title Pt will have improved BLE MMT to 5/5 in order to maximize gait and functional tasks.   Time 6   Period Weeks   Status On-going     PT LONG TERM GOAL #2   Title Pt will perform the 5xSTS in 10 sec or < with no hands and with proper mechanics in order to demonstrate improved overall BLE strength and power.   Time 6   Period Weeks   Status On-going     PT LONG TERM GOAL #3   Title Pt will perform the TUG with LRAD in 10 sec or < with LRAD in order to demonstrate improved gait and balance in order to promote participation in community events.   Time 6   Period Weeks   Status On-going     PT LONG TERM GOAL #4   Title Pt will have improved 3MWT to at least 682ft with LRAD, proper gait mechanics, and with minimal to no pain  to demo improved BLE strength, endurance, and overall function.   Time 6   Period Weeks   Status On-going     PT LONG TERM GOAL #5   Title Pt will have improved AROM from 0-115 deg in order to maximize gait and overall function.   Time 6   Period Weeks   Status On-going               Plan - 12/24/16 1837    Clinical Impression Statement Increased discomfort this session and continued swelling present.  Completed established therex with focus on ROM.  Only able to achieve 13-90 degrees this session in supine following manual techniques.  Added myofascial this session as with noted adhesions perimeter of knee.  Pt encourged to continue icing and educated on compression stockings. No change in pain at end of session.    Rehab Potential Good   PT Frequency 3x / week   PT Duration 6 weeks   PT Treatment/Interventions ADLs/Self Care  Home Management;Cryotherapy;Electrical Stimulation;DME Instruction;Gait training;Stair training;Functional mobility  training;Therapeutic activities;Therapeutic exercise;Balance training;Neuromuscular re-education;Patient/family education;Manual techniques;Passive range of motion;Dry needling   PT Next Visit Plan Continue manual as needed to improve ROM. Next session begin prone quad stretch with rope and standing TKE.   PT Home Exercise Plan eval: quad sets, heel slides, SAQ      Patient will benefit from skilled therapeutic intervention in order to improve the following deficits and impairments:  Abnormal gait, Decreased activity tolerance, Decreased balance, Decreased endurance, Decreased range of motion, Decreased strength, Difficulty walking, Hypomobility, Increased edema, Increased muscle spasms, Pain  Visit Diagnosis: Acute pain of right knee  Stiffness of right knee, not elsewhere classified  Difficulty in walking, not elsewhere classified  Muscle weakness (generalized)     Problem List Patient Active Problem List   Diagnosis Date Noted  . Primary osteoarthritis of right knee 12/04/2016  . OA (osteoarthritis) of knee 12/03/2016  . LLQ abdominal pain 03/15/2016  . Lateral meniscal tear 10/19/2015  . Left hip pain 07/22/2013  . Occipital neuralgia 06/04/2013  . Leg pain 02/22/2012  . Gout 02/22/2012  . Weight gain 01/11/2012  . Edema leg 06/07/2011  . Pruritus 04/23/2011  . Preop exam for internal medicine 04/23/2011  . Pneumonia, organism unspecified(486) 04/21/2011  . Cough 04/19/2011  . Paresthesia 09/26/2010  . NECK PAIN 08/22/2010  . BACK PAIN, THORACIC REGION 08/22/2010  . Low back pain 08/22/2010  . DIZZINESS 08/22/2010  . Cerumen impaction 07/21/2010  . CRAMP OF LIMB 03/16/2010  . BRONCHITIS, ACUTE 07/22/2009  . COPD 07/22/2009  . MEDIASTINAL LYMPHADENOPATHY 08/16/2008  . Nonspecific (abnormal) findings on radiological and other examination of body  structure 08/16/2008  . CT, CHEST, ABNORMAL 08/16/2008  . CHEST PAIN 06/24/2008  . BRADYCARDIA 06/23/2008  . GERD 06/23/2008  . SYNCOPE 06/23/2008  . B12 deficiency 04/09/2008  . Pain in Soft Tissues of Limb 01/08/2008  . RASH AND OTHER NONSPECIFIC SKIN ERUPTION 01/08/2008  . SINUSITIS, ACUTE 10/31/2007  . HIP PAIN 10/31/2007  . TOBACCO USE DISORDER/SMOKER-SMOKING CESSATION DISCUSSED 04/21/2007  . Essential hypertension 04/21/2007  . MENOPAUSAL SYNDROME 04/21/2007  . ALLERGIC RHINITIS 04/19/2007  . COLONIC POLYPS, HX OF 04/19/2007    Teena Irani, PTA/CLT [phone]  12/24/2016, 6:45 PM  [place] [address] [place], [place], [address] Phone: [phone]   Fax:  [phone]  Name: Courtney Nielsen MRN: [id number] Date of Birth: Dec 22, 1955

## 2016-12-26 ENCOUNTER — Ambulatory Visit (HOSPITAL_COMMUNITY): Payer: BLUE CROSS/BLUE SHIELD

## 2016-12-26 DIAGNOSIS — M25561 Pain in right knee: Secondary | ICD-10-CM

## 2016-12-26 DIAGNOSIS — M25661 Stiffness of right knee, not elsewhere classified: Secondary | ICD-10-CM

## 2016-12-26 DIAGNOSIS — R600 Localized edema: Secondary | ICD-10-CM

## 2016-12-26 DIAGNOSIS — M6281 Muscle weakness (generalized): Secondary | ICD-10-CM

## 2016-12-26 DIAGNOSIS — R262 Difficulty in walking, not elsewhere classified: Secondary | ICD-10-CM

## 2016-12-26 NOTE — Therapy (Signed)
[place], [place], [address] Phone: [phone]   Fax:  [phone]  Physical Therapy Treatment  Patient Details  Name: Courtney Nielsen MRN: [id number] Date of Birth: [birth year] Referring Provider: Gaynelle Arabian, MD  Encounter Date: 12/26/2016      PT End of Session - 12/26/16 0822    Visit Number 5   Number of Visits 19   Date for PT Re-Evaluation 01/07/17   Authorization Type BCBS Other   Authorization Time Period 12/17/16 to 01/28/17   Authorization - Visit Number 5   Authorization - Number of Visits 10   PT Start Time 0817   PT Stop Time 0903   PT Time Calculation (min) 46 min   Activity Tolerance Patient tolerated treatment well   Behavior During Therapy [facility] for tasks assessed/performed      Past Medical History:  Diagnosis Date  . Allergic rhinitis   . Arthritis   . Chest pain   . Colonic polyp   . COPD (chronic obstructive pulmonary disease) ([place])    patient denies at preop on ;   . Dysrhythmia    hx of every third beat is " off" per patient   . Hypertension   . Pneumonia    hx of 2014   . Smoker   . Vitamin B12 deficiency     Past Surgical History:  Procedure Laterality Date  . CARDIAC CATHETERIZATION     cath negative per patient   . CATARACT EXTRACTION W/PHACO Right 06/11/2016   Procedure: CATARACT EXTRACTION PHACO AND INTRAOCULAR LENS PLACEMENT RIGHT EYE CDE=6.15;  Surgeon: Tonny Branch, MD;  Location: [facility];  Service: Ophthalmology;  Laterality: Right;  right  . COLONOSCOPY    . KNEE ARTHROSCOPY Right 10/19/2015   Procedure: RIGHT KNEE SCOPE WITH MENISCAL DEBRIDEMENT,LATERAL SYNOVECTOMY;  Surgeon: Gaynelle Arabian, MD;  Location: [facility];  Service: Orthopedics;  Laterality: Right;  . TOTAL KNEE ARTHROPLASTY Right 12/03/2016   Procedure: RIGHT TOTAL KNEE ARTHROPLASTY;  Surgeon: Gaynelle Arabian, MD;  [facility];  Service: Orthopedics;  Laterality: Right;    There were no vitals filed  for this visit.      Subjective Assessment - 12/26/16 0820    Subjective Pt stated she got maybe 4 hours sleep last night due to knee pain.  Has taken pain meds prior session.  Current pain scale 3/10 achey tightness.     Pertinent History Rt knee scope 2017, GERD, HTN, COPD   Patient Stated Goals ROM, strengthening   Currently in Pain? Yes   Pain Score 3    Pain Location Knee   Pain Orientation Right   Pain Descriptors / Indicators Aching;Sharp;Tightness   Pain Radiating Towards none   Pain Onset 1 to 4 weeks ago   Pain Frequency Intermittent   Aggravating Factors  bending it, doing her HHPT exercises   Pain Relieving Factors ice, rest   Effect of Pain on Daily Activities increases                         [facility] Adult PT Treatment/Exercise - 12/26/16 0001      Knee/Hip Exercises: Stretches   Active Hamstring Stretch Right;3 reps;30 seconds   Active Hamstring Stretch Limitations supine with sheet   Quad Stretch 3 reps;30 seconds   Quad Stretch Limitations prone with sheet   Knee: Self-Stretch to increase Flexion 10 seconds   Knee: Self-Stretch Limitations 10x 10" hold on 12in step  Knee/Hip Exercises: Supine   Quad Sets Right;15 reps   Short Arc Quad Sets 15 reps   Heel Slides Right;10 reps;AROM   Knee Extension Limitations 8 degrees lacking     Knee/Hip Exercises: Prone   Hamstring Curl 10 reps   Straight Leg Raises Limitations TKE 10x 5"     Manual Therapy   Manual Therapy Myofascial release;Edema management   Manual therapy comments done seperate from all other aspects of treatment.   Edema Management retro massage with LE elevated    Myofascial Release to decreasse adhesions and scar tissue to increase ROM                  PT Short Term Goals - 12/19/16 1611      PT SHORT TERM GOAL #1   Title Pt will consistently and independently perform HEP to maximize return to PLOF.   Time 3   Period Weeks   Status On-going     PT SHORT  TERM GOAL #2   Title Pt will have improved AROM from 5-100 in order to maximize gait and stair ambulation.    Time 3   Period Weeks   Status On-going     PT SHORT TERM GOAL #3   Title Pt will have improved SLS to 30 sec or > without requiring UE support in order to demonstrate improved balance and to maximize gait.   Time 3   Period Weeks   Status On-going           PT Long Term Goals - 12/19/16 1611      PT LONG TERM GOAL #1   Title Pt will have improved BLE MMT to 5/5 in order to maximize gait and functional tasks.   Time 6   Period Weeks   Status On-going     PT LONG TERM GOAL #2   Title Pt will perform the 5xSTS in 10 sec or < with no hands and with proper mechanics in order to demonstrate improved overall BLE strength and power.   Time 6   Period Weeks   Status On-going     PT LONG TERM GOAL #3   Title Pt will perform the TUG with LRAD in 10 sec or < with LRAD in order to demonstrate improved gait and balance in order to promote participation in community events.   Time 6   Period Weeks   Status On-going     PT LONG TERM GOAL #4   Title Pt will have improved 3MWT to at least 659ft with LRAD, proper gait mechanics, and with minimal to no pain  to demo improved BLE strength, endurance, and overall function.   Time 6   Period Weeks   Status On-going     PT LONG TERM GOAL #5   Title Pt will have improved AROM from 0-115 deg in order to maximize gait and overall function.   Time 6   Period Weeks   Status On-going               Plan - 12/26/16 9449    Clinical Impression Statement Session focus on AROM.  Pt arrived with increased edema present proximal knee and the proximal steri strips have fallen off.  Began session with manual techniques for edema control and MFR to address adhesions on incision.  Added TKE in prone and quad stretches to improve mobility.  EOS improved AROM 8-98 degrees.     Rehab Potential Good   PT Frequency 3x /  week   PT Duration 6  weeks   PT Treatment/Interventions ADLs/Self Care Home Management;Cryotherapy;Electrical Stimulation;DME Instruction;Gait training;Stair training;Functional mobility training;Therapeutic activities;Therapeutic exercise;Balance training;Neuromuscular re-education;Patient/family education;Manual techniques;Passive range of motion;Dry needling   PT Next Visit Plan Continue manual as needed to improve ROM. Next session begin standing TKE.      Patient will benefit from skilled therapeutic intervention in order to improve the following deficits and impairments:  Abnormal gait, Decreased activity tolerance, Decreased balance, Decreased endurance, Decreased range of motion, Decreased strength, Difficulty walking, Hypomobility, Increased edema, Increased muscle spasms, Pain  Visit Diagnosis: Acute pain of right knee  Stiffness of right knee, not elsewhere classified  Difficulty in walking, not elsewhere classified  Muscle weakness (generalized)  Localized edema     Problem List Patient Active Problem List   Diagnosis Date Noted  . Primary osteoarthritis of right knee 12/04/2016  . OA (osteoarthritis) of knee 12/03/2016  . LLQ abdominal pain 03/15/2016  . Lateral meniscal tear 10/19/2015  . Left hip pain 07/22/2013  . Occipital neuralgia 06/04/2013  . Leg pain 02/22/2012  . Gout 02/22/2012  . Weight gain 01/11/2012  . Edema leg 06/07/2011  . Pruritus 04/23/2011  . Preop exam for internal medicine 04/23/2011  . Pneumonia, organism unspecified(486) 04/21/2011  . Cough 04/19/2011  . Paresthesia 09/26/2010  . NECK PAIN 08/22/2010  . BACK PAIN, THORACIC REGION 08/22/2010  . Low back pain 08/22/2010  . DIZZINESS 08/22/2010  . Cerumen impaction 07/21/2010  . CRAMP OF LIMB 03/16/2010  . BRONCHITIS, ACUTE 07/22/2009  . COPD 07/22/2009  . MEDIASTINAL LYMPHADENOPATHY 08/16/2008  . Nonspecific (abnormal) findings on radiological and other examination of body structure 08/16/2008  . CT,  CHEST, ABNORMAL 08/16/2008  . CHEST PAIN 06/24/2008  . BRADYCARDIA 06/23/2008  . GERD 06/23/2008  . SYNCOPE 06/23/2008  . B12 deficiency 04/09/2008  . Pain in Soft Tissues of Limb 01/08/2008  . RASH AND OTHER NONSPECIFIC SKIN ERUPTION 01/08/2008  . SINUSITIS, ACUTE 10/31/2007  . HIP PAIN 10/31/2007  . TOBACCO USE DISORDER/SMOKER-SMOKING CESSATION DISCUSSED 04/21/2007  . Essential hypertension 04/21/2007  . MENOPAUSAL SYNDROME 04/21/2007  . ALLERGIC RHINITIS 04/19/2007  . COLONIC POLYPS, HX OF 04/19/2007   Ihor Austin, LPTA; [place]  Aldona Lento 12/26/2016, 12:54 PM  [place], [place], [address] Phone: [phone]   Fax:  [phone]  Name: SOFIA VANMETER MRN: [id number] Date of Birth: 03/15/1956

## 2016-12-28 ENCOUNTER — Ambulatory Visit (HOSPITAL_COMMUNITY): Payer: BLUE CROSS/BLUE SHIELD

## 2016-12-28 DIAGNOSIS — M6281 Muscle weakness (generalized): Secondary | ICD-10-CM

## 2016-12-28 DIAGNOSIS — M25661 Stiffness of right knee, not elsewhere classified: Secondary | ICD-10-CM

## 2016-12-28 DIAGNOSIS — M25561 Pain in right knee: Secondary | ICD-10-CM | POA: Diagnosis not present

## 2016-12-28 DIAGNOSIS — R600 Localized edema: Secondary | ICD-10-CM | POA: Diagnosis not present

## 2016-12-28 DIAGNOSIS — R262 Difficulty in walking, not elsewhere classified: Secondary | ICD-10-CM

## 2016-12-28 NOTE — Therapy (Signed)
[place], [place], [address] Phone: [phone]   Fax:  [phone]  Physical Therapy Treatment  Patient Details  Name: Courtney Nielsen MRN: [id number] Date of Birth: 02-09-1956 Referring Provider: Gaynelle Arabian, MD  Encounter Date: 12/28/2016      PT End of Session - 12/28/16 1716    Visit Number 6   Number of Visits 19   Date for PT Re-Evaluation 01/07/17   Authorization Type BCBS Other   Authorization Time Period 12/17/16 to 01/28/17   Authorization - Visit Number 6   Authorization - Number of Visits 10   PT Start Time 2633   PT Stop Time 1742   PT Time Calculation (min) 51 min   Activity Tolerance Patient tolerated treatment well   Behavior During Therapy [facility] for tasks assessed/performed      Past Medical History:  Diagnosis Date  . Allergic rhinitis   . Arthritis   . Chest pain   . Colonic polyp   . COPD (chronic obstructive pulmonary disease) ([place])    patient denies at preop on ;   . Dysrhythmia    hx of every third beat is " off" per patient   . Hypertension   . Pneumonia    hx of 2014   . Smoker   . Vitamin B12 deficiency     Past Surgical History:  Procedure Laterality Date  . CARDIAC CATHETERIZATION     cath negative per patient   . CATARACT EXTRACTION W/PHACO Right 06/11/2016   Procedure: CATARACT EXTRACTION PHACO AND INTRAOCULAR LENS PLACEMENT RIGHT EYE CDE=6.15;  Surgeon: Tonny Branch, MD;  Location: [facility];  Service: Ophthalmology;  Laterality: Right;  right  . COLONOSCOPY    . KNEE ARTHROSCOPY Right 10/19/2015   Procedure: RIGHT KNEE SCOPE WITH MENISCAL DEBRIDEMENT,LATERAL SYNOVECTOMY;  Surgeon: Gaynelle Arabian, MD;  Location: [facility];  Service: Orthopedics;  Laterality: Right;  . TOTAL KNEE ARTHROPLASTY Right 12/03/2016   Procedure: RIGHT TOTAL KNEE ARTHROPLASTY;  Surgeon: Gaynelle Arabian, MD;  Location: [facility];  Service: Orthopedics;  Laterality: Right;    There were no vitals filed  for this visit.      Subjective Assessment - 12/28/16 1653    Subjective Pt stated she had to ride to [place] last night to help family member with broken down, increased stiffness following sitting in one spot, reports she did HEP and applied ice once immediately home.  Current pain scale 2/10 mainly sore tightness Rt knee.   Pertinent History Rt knee scope 2017, GERD, HTN, COPD   Patient Stated Goals ROM, strengthening   Currently in Pain? Yes   Pain Score 2    Pain Location Knee   Pain Orientation Right   Pain Descriptors / Indicators Tightness;Sore;Aching   Pain Type Surgical pain   Pain Radiating Towards none   Pain Onset 1 to 4 weeks ago   Pain Frequency Intermittent   Aggravating Factors  bending it, doing her HHPT exercises   Pain Relieving Factors ice, rest   Effect of Pain on Daily Activities increases   Multiple Pain Sites Yes                         [facility] Adult PT Treatment/Exercise - 12/28/16 0001      Ambulation/Gait   Ambulation Distance (Feet) 226 Feet   Assistive device Straight cane   Gait Pattern Step-through pattern;Decreased stance time - right;Decreased step length - left;Decreased stride  length;Antalgic;Trendelenburg;Trunk flexed   Gait Comments Cueing for appropriate hand use, sequence and equal stance phase heel to toe mechanics     Knee/Hip Exercises: Stretches   Active Hamstring Stretch Right;3 reps;30 seconds   Active Hamstring Stretch Limitations supine with sheet   Quad Stretch 3 reps;30 seconds   Quad Stretch Limitations prone with sheet   Knee: Self-Stretch to increase Flexion 10 seconds   Knee: Self-Stretch Limitations 10x 10" hold on 12in step     Knee/Hip Exercises: Standing   Terminal Knee Extension Limitations 10x5"     Knee/Hip Exercises: Supine   Quad Sets Right;15 reps   Short Arc Quad Sets 15 reps   Heel Slides Right;10 reps;AROM   Terminal Knee Extension AAROM;Right;5 reps   Knee Extension Limitations 8 degrees  lacking   Knee Flexion Limitations AROM 98 degrees flexion; AAROM 100 degrees     Knee/Hip Exercises: Prone   Hamstring Curl 10 reps   Contract/Relax to Increase Flexion 5x   Straight Leg Raises Limitations TKE 10x 5"     Manual Therapy   Manual Therapy Myofascial release;Edema management   Manual therapy comments done seperate from all other aspects of treatment.   Edema Management retro massage with LE elevated    Myofascial Release to decreasse adhesions and scar tissue to increase ROM                  PT Education - 12/28/16 1756    Education provided Yes   Education Details Given paperwork for purchase compression hose to assist with edema control.     Person(s) Educated Patient   Methods Explanation;Handout   Comprehension Verbalized understanding          PT Short Term Goals - 12/19/16 1611      PT SHORT TERM GOAL #1   Title Pt will consistently and independently perform HEP to maximize return to PLOF.   Time 3   Period Weeks   Status On-going     PT SHORT TERM GOAL #2   Title Pt will have improved AROM from 5-100 in order to maximize gait and stair ambulation.    Time 3   Period Weeks   Status On-going     PT SHORT TERM GOAL #3   Title Pt will have improved SLS to 30 sec or > without requiring UE support in order to demonstrate improved balance and to maximize gait.   Time 3   Period Weeks   Status On-going           PT Long Term Goals - 12/19/16 1611      PT LONG TERM GOAL #1   Title Pt will have improved BLE MMT to 5/5 in order to maximize gait and functional tasks.   Time 6   Period Weeks   Status On-going     PT LONG TERM GOAL #2   Title Pt will perform the 5xSTS in 10 sec or < with no hands and with proper mechanics in order to demonstrate improved overall BLE strength and power.   Time 6   Period Weeks   Status On-going     PT LONG TERM GOAL #3   Title Pt will perform the TUG with LRAD in 10 sec or < with LRAD in order to  demonstrate improved gait and balance in order to promote participation in community events.   Time 6   Period Weeks   Status On-going     PT LONG TERM GOAL #4  Title Pt will have improved 3MWT to at least 653ft with LRAD, proper gait mechanics, and with minimal to no pain  to demo improved BLE strength, endurance, and overall function.   Time 6   Period Weeks   Status On-going     PT LONG TERM GOAL #5   Title Pt will have improved AROM from 0-115 deg in order to maximize gait and overall function.   Time 6   Period Weeks   Status On-going               Plan - 12/28/16 1749    Clinical Impression Statement Continued session focus with ROM.  Pt's knee continues to be limited mobility due to edema present proximal knee.  Began session with manual technqiues for edema control.  Pt educated on benefits of compression hose for edema control and given paperwork for compression store in [place].  Therex focus on ROM based activities with addition of TKE in standing and AAROM in supine.  Added contract relax to improve flexion in prone.  AROM at 8-98 degrees, 100 degrees with AAROM with sheet.  Began gait training wiht SPC with cueing for sequence and educated on reasoning for appropraite UE use, able to demonstrate appropriate mechanics safely, encouraged to try at home.     Rehab Potential Good   PT Frequency 3x / week   PT Duration 6 weeks   PT Treatment/Interventions ADLs/Self Care Home Management;Cryotherapy;Electrical Stimulation;DME Instruction;Gait training;Stair training;Functional mobility training;Therapeutic activities;Therapeutic exercise;Balance training;Neuromuscular re-education;Patient/family education;Manual techniques;Passive range of motion;Dry needling   PT Next Visit Plan Continue manual as needed to improve ROM.  F/u with compression hose purchase, answer questions concerning.  Continue with ROM based therex until range at least 5-115 degrees.     PT Home Exercise  Plan eval: quad sets, heel slides, SAQ      Patient will benefit from skilled therapeutic intervention in order to improve the following deficits and impairments:  Abnormal gait, Decreased activity tolerance, Decreased balance, Decreased endurance, Decreased range of motion, Decreased strength, Difficulty walking, Hypomobility, Increased edema, Increased muscle spasms, Pain  Visit Diagnosis: Acute pain of right knee  Stiffness of right knee, not elsewhere classified  Difficulty in walking, not elsewhere classified  Muscle weakness (generalized)  Localized edema     Problem List Patient Active Problem List   Diagnosis Date Noted  . Primary osteoarthritis of right knee 12/04/2016  . OA (osteoarthritis) of knee 12/03/2016  . LLQ abdominal pain 03/15/2016  . Lateral meniscal tear 10/19/2015  . Left hip pain 07/22/2013  . Occipital neuralgia 06/04/2013  . Leg pain 02/22/2012  . Gout 02/22/2012  . Weight gain 01/11/2012  . Edema leg 06/07/2011  . Pruritus 04/23/2011  . Preop exam for internal medicine 04/23/2011  . Pneumonia, organism unspecified([place]) 04/21/2011  . Cough 04/19/2011  . Paresthesia 09/26/2010  . NECK PAIN 08/22/2010  . BACK PAIN, THORACIC REGION 08/22/2010  . Low back pain 08/22/2010  . DIZZINESS 08/22/2010  . Cerumen impaction 07/21/2010  . CRAMP OF LIMB 03/16/2010  . BRONCHITIS, ACUTE 07/22/2009  . COPD 07/22/2009  . MEDIASTINAL LYMPHADENOPATHY 08/16/2008  . Nonspecific (abnormal) findings on radiological and other examination of body structure 08/16/2008  . CT, CHEST, ABNORMAL 08/16/2008  . CHEST PAIN 06/24/2008  . BRADYCARDIA 06/23/2008  . GERD 06/23/2008  . SYNCOPE 06/23/2008  . B12 deficiency 04/09/2008  . Pain in Soft Tissues of Limb 01/08/2008  . RASH AND OTHER NONSPECIFIC SKIN ERUPTION 01/08/2008  . SINUSITIS, ACUTE 10/31/2007  .  HIP PAIN 10/31/2007  . TOBACCO USE DISORDER/SMOKER-SMOKING CESSATION DISCUSSED 04/21/2007  . Essential  hypertension 04/21/2007  . MENOPAUSAL SYNDROME 04/21/2007  . ALLERGIC RHINITIS 04/19/2007  . COLONIC POLYPS, HX OF 04/19/2007   Ihor Austin, LPTA; [place]  Aldona Lento 12/28/2016, 5:57 PM  [place], [place], [address] Phone: [phone]   Fax:  [phone]  Name: Courtney Nielsen MRN: [id number] Date of Birth: 02-21-1956

## 2017-01-01 ENCOUNTER — Ambulatory Visit (HOSPITAL_COMMUNITY): Payer: BLUE CROSS/BLUE SHIELD | Attending: Orthopedic Surgery

## 2017-01-01 DIAGNOSIS — M25561 Pain in right knee: Secondary | ICD-10-CM | POA: Diagnosis not present

## 2017-01-01 DIAGNOSIS — M6281 Muscle weakness (generalized): Secondary | ICD-10-CM | POA: Insufficient documentation

## 2017-01-01 DIAGNOSIS — R600 Localized edema: Secondary | ICD-10-CM | POA: Insufficient documentation

## 2017-01-01 DIAGNOSIS — M25661 Stiffness of right knee, not elsewhere classified: Secondary | ICD-10-CM | POA: Insufficient documentation

## 2017-01-01 DIAGNOSIS — R262 Difficulty in walking, not elsewhere classified: Secondary | ICD-10-CM | POA: Diagnosis not present

## 2017-01-01 NOTE — Patient Instructions (Signed)
Knee Extension Mobilization: Hang (Prone)    With table supporting thighs, place ____ pound weight on right ankle. Hold 3 minutes and increase time as tolerated. Repeat 2 times per set. Do  sessions per day.  http://orth.exer.us/723   Copyright  VHI. All rights reserved.   Hamstring Stretch    With other leg bent, foot flat, grasp right leg and slowly try to straighten knee. Hold 30 seconds. Repeat 3 times. Do 2 sessions per day.  http://gt2.exer.us/280   Copyright  VHI. All rights reserved.   Toe / Heel Raise (Standing)    Standing with support, raise heels, then rock back on heels and raise toes. Repeat 20 times.  Copyright  VHI. All rights reserved.

## 2017-01-01 NOTE — Therapy (Signed)
[place], [place], [address] Phone: [phone]   Fax:  [phone]  Physical Therapy Treatment  Patient Details  Name: Courtney Nielsen MRN: [id number] Date of Birth: 23-Dec-1955 Referring Provider: Gaynelle Arabian, MD  Encounter Date: 01/01/2017      PT End of Session - 01/01/17 6226    Visit Number 7   Number of Visits 19   Date for PT Re-Evaluation 01/07/17   Authorization Type BCBS Other   Authorization Time Period 12/17/16 to 01/28/17   Authorization - Visit Number 7   Authorization - Number of Visits 10   PT Start Time 3335   PT Stop Time 4562   PT Time Calculation (min) 46 min   Activity Tolerance Patient tolerated treatment well;No increased pain   Behavior During Therapy WFL for tasks assessed/performed      Past Medical History:  Diagnosis Date  . Allergic rhinitis   . Arthritis   . Chest pain   . Colonic polyp   . COPD (chronic obstructive pulmonary disease) ([place])    patient denies at preop on ;   . Dysrhythmia    hx of every third beat is " off" per patient   . Hypertension   . Pneumonia    hx of 2014   . Smoker   . Vitamin B12 deficiency     Past Surgical History:  Procedure Laterality Date  . CARDIAC CATHETERIZATION     cath negative per patient   . CATARACT EXTRACTION W/PHACO Right 06/11/2016   Procedure: CATARACT EXTRACTION PHACO AND INTRAOCULAR LENS PLACEMENT RIGHT EYE CDE=6.15;  Surgeon: Tonny Branch, MD;  Location: [facility];  Service: Ophthalmology;  Laterality: Right;  right  . COLONOSCOPY    . KNEE ARTHROSCOPY Right 10/19/2015   Procedure: RIGHT KNEE SCOPE WITH MENISCAL DEBRIDEMENT,LATERAL SYNOVECTOMY;  Surgeon: Gaynelle Arabian, MD;  Location: [facility];  Service: Orthopedics;  Laterality: Right;  . TOTAL KNEE ARTHROPLASTY Right 12/03/2016   Procedure: RIGHT TOTAL KNEE ARTHROPLASTY;  Surgeon: Gaynelle Arabian, MD;  Location: [facility];  Service: Orthopedics;  Laterality: Right;    There  were no vitals filed for this visit.      Subjective Assessment - 01/01/17 1435    Subjective Pt entered dept ambulating with SPC.  No reports of current pain, mainly stiffness today.  reports compliance with HEP earlier today with ice application   Pertinent History Rt knee scope 2017, GERD, HTN, COPD   Patient Stated Goals ROM, strengthening   Currently in Pain? No/denies                         [facility] Adult PT Treatment/Exercise - 01/01/17 0001      Ambulation/Gait   Ambulation Distance (Feet) 226 Feet   Assistive device Straight cane   Gait Pattern Step-through pattern;Decreased stance time - right;Decreased step length - left;Decreased stride length;Antalgic;Trendelenburg;Trunk flexed   Gait Comments Cueing for appropriate hand use, sequence and equal stance phase heel to toe mechanics     Knee/Hip Exercises: Stretches   Active Hamstring Stretch Right;3 reps;30 seconds   Active Hamstring Stretch Limitations supine with sheet   Quad Stretch 3 reps;30 seconds   Quad Stretch Limitations prone with sheet   Knee: Self-Stretch to increase Flexion 10 seconds   Knee: Self-Stretch Limitations 10x 10" hold on 12in step     Knee/Hip Exercises: Standing   Knee Flexion Right;15 reps   Terminal Knee Extension Limitations 10x5"  Rocker Board 2 minutes   Rocker Board Limitations R/L and Df/PF     Knee/Hip Exercises: Supine   Quad Sets 15 reps   Short Arc Quad Sets 15 reps   Heel Slides Right;10 reps;AROM   Knee Extension Limitations 6 degrees lacking   Knee Flexion Limitations AROM 98 degrees flexion; AAROM 100 degrees     Knee/Hip Exercises: Prone   Hamstring Curl 10 reps   Prone Knee Hang 3 minutes   Prone Knee Hang Limitations manual to hamstrings   Straight Leg Raises Limitations TKE 10x 5"     Manual Therapy   Manual Therapy Myofascial release;Edema management   Manual therapy comments done seperate from all other aspects of treatment.   Edema Management  retro massage with LE elevated    Myofascial Release to decreasse adhesions and scar tissue to increase ROM                  PT Short Term Goals - 12/19/16 1611      PT SHORT TERM GOAL #1   Title Pt will consistently and independently perform HEP to maximize return to PLOF.   Time 3   Period Weeks   Status On-going     PT SHORT TERM GOAL #2   Title Pt will have improved AROM from 5-100 in order to maximize gait and stair ambulation.    Time 3   Period Weeks   Status On-going     PT SHORT TERM GOAL #3   Title Pt will have improved SLS to 30 sec or > without requiring UE support in order to demonstrate improved balance and to maximize gait.   Time 3   Period Weeks   Status On-going           PT Long Term Goals - 12/19/16 1611      PT LONG TERM GOAL #1   Title Pt will have improved BLE MMT to 5/5 in order to maximize gait and functional tasks.   Time 6   Period Weeks   Status On-going     PT LONG TERM GOAL #2   Title Pt will perform the 5xSTS in 10 sec or < with no hands and with proper mechanics in order to demonstrate improved overall BLE strength and power.   Time 6   Period Weeks   Status On-going     PT LONG TERM GOAL #3   Title Pt will perform the TUG with LRAD in 10 sec or < with LRAD in order to demonstrate improved gait and balance in order to promote participation in community events.   Time 6   Period Weeks   Status On-going     PT LONG TERM GOAL #4   Title Pt will have improved 3MWT to at least 626ft with LRAD, proper gait mechanics, and with minimal to no pain  to demo improved BLE strength, endurance, and overall function.   Time 6   Period Weeks   Status On-going     PT LONG TERM GOAL #5   Title Pt will have improved AROM from 0-115 deg in order to maximize gait and overall function.   Time 6   Period Weeks   Status On-going               Plan - 01/01/17 1509    Clinical Impression Statement Continued session focus with  ROM.  Pt continues to be limited mobiltiy due to edema present proximal knee and adhesions along incision,  continued wiht manual technqiues including retro massage and myofascial release.  Added prone knee hang to improve extensin with manual STM to hamstrings.  Pt making slow progressing with AROM at 6-100 degrees at EOS.  No reports of increased pain.  Pt given additional HEP including hamstring stretch, prone knee hang and heel/toe raisese for gait strengtheniung.     Rehab Potential Good   PT Frequency 3x / week   PT Duration 6 weeks   PT Treatment/Interventions ADLs/Self Care Home Management;Cryotherapy;Electrical Stimulation;DME Instruction;Gait training;Stair training;Functional mobility training;Therapeutic activities;Therapeutic exercise;Balance training;Neuromuscular re-education;Patient/family education;Manual techniques;Passive range of motion;Dry needling   PT Next Visit Plan Continue manual as needed to improve ROM.  F/u with compression hose purchase, answer questions concerning.  Continue with ROM based therex until range at least 5-115 degrees.  Next session begin standing hamstring stretch and resume slant board to improve flexibiltiy.   PT Home Exercise Plan eval: quad sets, heel slides, SAQ; hamstrings stretch, prone knee hang      Patient will benefit from skilled therapeutic intervention in order to improve the following deficits and impairments:  Abnormal gait, Decreased activity tolerance, Decreased balance, Decreased endurance, Decreased range of motion, Decreased strength, Difficulty walking, Hypomobility, Increased edema, Increased muscle spasms, Pain  Visit Diagnosis: Acute pain of right knee  Stiffness of right knee, not elsewhere classified  Difficulty in walking, not elsewhere classified  Muscle weakness (generalized)  Localized edema     Problem List Patient Active Problem List   Diagnosis Date Noted  . Primary osteoarthritis of right knee 12/04/2016  .  OA (osteoarthritis) of knee 12/03/2016  . LLQ abdominal pain 03/15/2016  . Lateral meniscal tear 10/19/2015  . Left hip pain 07/22/2013  . Occipital neuralgia 06/04/2013  . Leg pain 02/22/2012  . Gout 02/22/2012  . Weight gain 01/11/2012  . Edema leg 06/07/2011  . Pruritus 04/23/2011  . Preop exam for internal medicine 04/23/2011  . Pneumonia, organism unspecified(486) 04/21/2011  . Cough 04/19/2011  . Paresthesia 09/26/2010  . NECK PAIN 08/22/2010  . BACK PAIN, THORACIC REGION 08/22/2010  . Low back pain 08/22/2010  . DIZZINESS 08/22/2010  . Cerumen impaction 07/21/2010  . CRAMP OF LIMB 03/16/2010  . BRONCHITIS, ACUTE 07/22/2009  . COPD 07/22/2009  . MEDIASTINAL LYMPHADENOPATHY 08/16/2008  . Nonspecific (abnormal) findings on radiological and other examination of body structure 08/16/2008  . CT, CHEST, ABNORMAL 08/16/2008  . CHEST PAIN 06/24/2008  . BRADYCARDIA 06/23/2008  . GERD 06/23/2008  . SYNCOPE 06/23/2008  . B12 deficiency 04/09/2008  . Pain in Soft Tissues of Limb 01/08/2008  . RASH AND OTHER NONSPECIFIC SKIN ERUPTION 01/08/2008  . SINUSITIS, ACUTE 10/31/2007  . HIP PAIN 10/31/2007  . TOBACCO USE DISORDER/SMOKER-SMOKING CESSATION DISCUSSED 04/21/2007  . Essential hypertension 04/21/2007  . MENOPAUSAL SYNDROME 04/21/2007  . ALLERGIC RHINITIS 04/19/2007  . COLONIC POLYPS, HX OF 04/19/2007   Ihor Austin, LPTA; [place]  Aldona Lento 01/01/2017, 6:06 PM  [place], [place], [address] Phone: [phone]   Fax:  [phone]  Name: Courtney Nielsen MRN: [id number] Date of Birth: 18-Dec-1955

## 2017-01-03 ENCOUNTER — Ambulatory Visit (HOSPITAL_COMMUNITY): Payer: BLUE CROSS/BLUE SHIELD

## 2017-01-03 DIAGNOSIS — R600 Localized edema: Secondary | ICD-10-CM | POA: Diagnosis not present

## 2017-01-03 DIAGNOSIS — M25561 Pain in right knee: Secondary | ICD-10-CM

## 2017-01-03 DIAGNOSIS — R262 Difficulty in walking, not elsewhere classified: Secondary | ICD-10-CM | POA: Diagnosis not present

## 2017-01-03 DIAGNOSIS — M25661 Stiffness of right knee, not elsewhere classified: Secondary | ICD-10-CM

## 2017-01-03 DIAGNOSIS — M6281 Muscle weakness (generalized): Secondary | ICD-10-CM

## 2017-01-03 NOTE — Therapy (Signed)
[place], [place], [address] Phone: [phone]   Fax:  [phone]  Physical Therapy Treatment  Patient Details  Name: Courtney Nielsen MRN: [id number] Date of Birth: 06-30-1956 Referring Provider: Gaynelle Arabian, MD  Encounter Date: 01/03/2017      PT End of Session - 01/03/17 1711    Visit Number 8   Number of Visits 19   Date for PT Re-Evaluation 01/07/17   Authorization Type BC[employer] Other   Authorization Time Period 12/17/16 to 01/28/17   Authorization - Visit Number 8   Authorization - Number of Visits 10   PT Start Time 6387   PT Stop Time 1735   PT Time Calculation (min) 45 min   Activity Tolerance Patient tolerated treatment well;No increased pain   Behavior During Therapy WFL for tasks assessed/performed      Past Medical History:  Diagnosis Date  . Allergic rhinitis   . Arthritis   . Chest pain   . Colonic polyp   . COPD (chronic obstructive pulmonary disease) ([place])    patient denies at preop on ;   . Dysrhythmia    hx of every third beat is " off" per patient   . Hypertension   . Pneumonia    hx of 2014   . Smoker   . Vitamin B12 deficiency     Past Surgical History:  Procedure Laterality Date  . CARDIAC CATHETERIZATION     cath negative per patient   . CATARACT EXTRACTION W/PHACO Right 06/11/2016   Procedure: CATARACT EXTRACTION PHACO AND INTRAOCULAR LENS PLACEMENT RIGHT EYE CDE=6.15;  Surgeon: Tonny Branch, MD;  Location: [facility];  Service: Ophthalmology;  Laterality: Right;  right  . COLONOSCOPY    . KNEE ARTHROSCOPY Right 10/19/2015   Procedure: RIGHT KNEE SCOPE WITH MENISCAL DEBRIDEMENT,LATERAL SYNOVECTOMY;  Surgeon: Gaynelle Arabian, MD;  Location: [facility];  Service: Orthopedics;  Laterality: Right;  . TOTAL KNEE ARTHROPLASTY Right 12/03/2016   Procedure: RIGHT TOTAL KNEE ARTHROPLASTY;  Surgeon: Gaynelle Arabian, MD;  Location: [facility];  Service: Orthopedics;  Laterality: Right;    There  were no vitals filed for this visit.      Subjective Assessment - 01/03/17 1651    Subjective Pt stated she is doing good has began new HEP without questions, does have some questions concerning compression.  Pain scale 2/10 Rt knee mainly tightness   Pertinent History Rt knee scope 2017, GERD, HTN, COPD   Patient Stated Goals ROM, strengthening   Currently in Pain? Yes   Pain Score 2    Pain Location Knee   Pain Orientation Right   Pain Descriptors / Indicators Tightness;Aching   Pain Type Surgical pain   Pain Onset 1 to 4 weeks ago   Pain Frequency Intermittent   Aggravating Factors  bending it, doing her HHPT exercises   Pain Relieving Factors ice, rest   Effect of Pain on Daily Activities increases             [facility] Adult PT Treatment/Exercise - 01/03/17 0001      Knee/Hip Exercises: Stretches   Active Hamstring Stretch Right;3 reps;30 seconds   Active Hamstring Stretch Limitations 12in step   Quad Stretch 3 reps;30 seconds   Quad Stretch Limitations prone with sheet   Knee: Self-Stretch to increase Flexion 10 seconds   Knee: Self-Stretch Limitations 10x 10" hold on 12in step   Gastroc Stretch 3 reps;30 seconds   Gastroc Stretch Limitations slant board  Knee/Hip Exercises: Standing   Knee Flexion Right;15 reps   Terminal Knee Extension Limitations 10x5"     Knee/Hip Exercises: Supine   Quad Sets 15 reps   Short Arc Quad Sets 15 reps   Heel Slides Right;10 reps;AROM   Heel Slides Limitations 5-98   Knee Extension Limitations 5 degrees lacking   Knee Flexion Limitations AROM 98 degrees flexion; AAROM 100 degrees     Knee/Hip Exercises: Prone   Hamstring Curl 10 reps     Manual Therapy   Manual Therapy Myofascial release;Edema management   Manual therapy comments done seperate from all other aspects of treatment.   Edema Management retro massage with LE elevated    Myofascial Release to decreasse adhesions and scar tissue to increase ROM                 PT Education - 01/03/17 1728    Education provided Yes   Education Details Answered questions concerning purchase of compression hose, 20-30 mmHg thigh high, measurements complete for compression hose assistance   Person(s) Educated Patient   Methods Explanation;Handout  Measurement and appropriate size written on compression hose handout   Comprehension Verbalized understanding          PT Short Term Goals - 12/19/16 1611      PT SHORT TERM GOAL #1   Title Pt will consistently and independently perform HEP to maximize return to PLOF.   Time 3   Period Weeks   Status On-going     PT SHORT TERM GOAL #2   Title Pt will have improved AROM from 5-100 in order to maximize gait and stair ambulation.    Time 3   Period Weeks   Status On-going     PT SHORT TERM GOAL #3   Title Pt will have improved SLS to 30 sec or > without requiring UE support in order to demonstrate improved balance and to maximize gait.   Time 3   Period Weeks   Status On-going           PT Long Term Goals - 12/19/16 1611      PT LONG TERM GOAL #1   Title Pt will have improved BLE MMT to 5/5 in order to maximize gait and functional tasks.   Time 6   Period Weeks   Status On-going     PT LONG TERM GOAL #2   Title Pt will perform the 5xSTS in 10 sec or < with no hands and with proper mechanics in order to demonstrate improved overall BLE strength and power.   Time 6   Period Weeks   Status On-going     PT LONG TERM GOAL #3   Title Pt will perform the TUG with LRAD in 10 sec or < with LRAD in order to demonstrate improved gait and balance in order to promote participation in community events.   Time 6   Period Weeks   Status On-going     PT LONG TERM GOAL #4   Title Pt will have improved 3MWT to at least 698ft with LRAD, proper gait mechanics, and with minimal to no pain  to demo improved BLE strength, endurance, and overall function.   Time 6   Period Weeks   Status  On-going     PT LONG TERM GOAL #5   Title Pt will have improved AROM from 0-115 deg in order to maximize gait and overall function.   Time 6   Period Weeks  Status On-going               Plan - 01/03/17 1744    Clinical Impression Statement Questions answered and measurements complete to assist with compression hose, encouraged pt to look for thigh high 20-30 mmHg to assist with edema control.  Continued session focus with ROM including manual techniques and therex.  Pt tolerated well towards whole session with no reports of increased pain.  EOS AROM at 5-98, AAROM wiht rope to 100 degrees.  Pt continues to be limited by edema present proximal knee, discussed the benefits of compression hose and increased ice application will assist with edema control.     Rehab Potential Good   PT Frequency 3x / week   PT Duration 6 weeks   PT Treatment/Interventions ADLs/Self Care Home Management;Cryotherapy;Electrical Stimulation;DME Instruction;Gait training;Stair training;Functional mobility training;Therapeutic activities;Therapeutic exercise;Balance training;Neuromuscular re-education;Patient/family education;Manual techniques;Passive range of motion;Dry needling   PT Next Visit Plan Continue manual as needed to improve ROM.  F/u with compression hose purchase, answer questions concerning.  Continue with ROM based therex until range at least 5-115 degrees.  Next session PROM to improve range within tolerance.  Add standing TKE to HEP.   PT Home Exercise Plan eval: quad sets, heel slides, SAQ; hamstrings stretch, prone knee hang      Patient will benefit from skilled therapeutic intervention in order to improve the following deficits and impairments:  Abnormal gait, Decreased activity tolerance, Decreased balance, Decreased endurance, Decreased range of motion, Decreased strength, Difficulty walking, Hypomobility, Increased edema, Increased muscle spasms, Pain  Visit Diagnosis: Acute pain of  right knee  Difficulty in walking, not elsewhere classified  Muscle weakness (generalized)  Localized edema  Stiffness of right knee, not elsewhere classified     Problem List Patient Active Problem List   Diagnosis Date Noted  . Primary osteoarthritis of right knee 12/04/2016  . OA (osteoarthritis) of knee 12/03/2016  . LLQ abdominal pain 03/15/2016  . Lateral meniscal tear 10/19/2015  . Left hip pain 07/22/2013  . Occipital neuralgia 06/04/2013  . Leg pain 02/22/2012  . Gout 02/22/2012  . Weight gain 01/11/2012  . Edema leg 06/07/2011  . Pruritus 04/23/2011  . Preop exam for internal medicine 04/23/2011  . Pneumonia, organism unspecified(486) 04/21/2011  . Cough 04/19/2011  . Paresthesia 09/26/2010  . NECK PAIN 08/22/2010  . BACK PAIN, THORACIC REGION 08/22/2010  . Low back pain 08/22/2010  . DIZZINESS 08/22/2010  . Cerumen impaction 07/21/2010  . CRAMP OF LIMB 03/16/2010  . BRONCHITIS, ACUTE 07/22/2009  . COPD 07/22/2009  . MEDIASTINAL LYMPHADENOPATHY 08/16/2008  . Nonspecific (abnormal) findings on radiological and other examination of body structure 08/16/2008  . CT, CHEST, ABNORMAL 08/16/2008  . CHEST PAIN 06/24/2008  . BRADYCARDIA 06/23/2008  . GERD 06/23/2008  . SYNCOPE 06/23/2008  . B12 deficiency 04/09/2008  . Pain in Soft Tissues of Limb 01/08/2008  . RASH AND OTHER NONSPECIFIC SKIN ERUPTION 01/08/2008  . SINUSITIS, ACUTE 10/31/2007  . HIP PAIN 10/31/2007  . TOBACCO USE DISORDER/SMOKER-SMOKING CESSATION DISCUSSED 04/21/2007  . Essential hypertension 04/21/2007  . MENOPAUSAL SYNDROME 04/21/2007  . ALLERGIC RHINITIS 04/19/2007  . COLONIC POLYPS, HX OF 04/19/2007   Ihor Austin, LPTA; CBIS [phone]   Aldona Lento 01/03/2017, 6:46 PM  [place], [place], [address] Phone: [phone]   Fax:  [phone]  Name: LYSETTE LINDENBAUM MRN: [id number] Date of Birth:  1955-08-02

## 2017-01-07 ENCOUNTER — Ambulatory Visit (HOSPITAL_COMMUNITY): Payer: BLUE CROSS/BLUE SHIELD | Admitting: Physical Therapy

## 2017-01-07 DIAGNOSIS — M25561 Pain in right knee: Secondary | ICD-10-CM

## 2017-01-07 DIAGNOSIS — R262 Difficulty in walking, not elsewhere classified: Secondary | ICD-10-CM

## 2017-01-07 DIAGNOSIS — R600 Localized edema: Secondary | ICD-10-CM | POA: Diagnosis not present

## 2017-01-07 DIAGNOSIS — M25661 Stiffness of right knee, not elsewhere classified: Secondary | ICD-10-CM | POA: Diagnosis not present

## 2017-01-07 DIAGNOSIS — M6281 Muscle weakness (generalized): Secondary | ICD-10-CM

## 2017-01-07 NOTE — Therapy (Signed)
[place], [place], [address] Phone: [phone]   Fax:  [phone]  Physical Therapy Treatment  Patient Details  Name: Courtney Nielsen MRN: [id number] Date of Birth: Dec 11, 1955 Referring Provider: Gaynelle Arabian, MD  Encounter Date: 01/07/2017      PT End of Session - 01/07/17 1655    Visit Number 9   Number of Visits 19   Date for PT Re-Evaluation 01/09/17   Authorization Type BCBS Other   Authorization Time Period 12/17/16 to 01/28/17   Authorization - Visit Number 9   Authorization - Number of Visits 10   PT Start Time 0086   PT Stop Time 1735   PT Time Calculation (min) 45 min   Activity Tolerance Patient tolerated treatment well;No increased pain   Behavior During Therapy WFL for tasks assessed/performed      Past Medical History:  Diagnosis Date  . Allergic rhinitis   . Arthritis   . Chest pain   . Colonic polyp   . COPD (chronic obstructive pulmonary disease) ([place])    patient denies at preop on ;   . Dysrhythmia    hx of every third beat is " off" per patient   . Hypertension   . Pneumonia    hx of 2014   . Smoker   . Vitamin B12 deficiency     Past Surgical History:  Procedure Laterality Date  . CARDIAC CATHETERIZATION     cath negative per patient   . CATARACT EXTRACTION W/PHACO Right 06/11/2016   Procedure: CATARACT EXTRACTION PHACO AND INTRAOCULAR LENS PLACEMENT RIGHT EYE CDE=6.15;  Surgeon: Tonny Branch, MD;  Location: [facility];  Service: Ophthalmology;  Laterality: Right;  right  . COLONOSCOPY    . KNEE ARTHROSCOPY Right 10/19/2015   Procedure: RIGHT KNEE SCOPE WITH MENISCAL DEBRIDEMENT,LATERAL SYNOVECTOMY;  Surgeon: Gaynelle Arabian, MD;  Location: [facility];  Service: Orthopedics;  Laterality: Right;  . TOTAL KNEE ARTHROPLASTY Right 12/03/2016   Procedure: RIGHT TOTAL KNEE ARTHROPLASTY;  Surgeon: Gaynelle Arabian, MD;  Location: [facility];  Service: Orthopedics;  Laterality: Right;    There  were no vitals filed for this visit.      Subjective Assessment - 01/07/17 1657    Subjective Pt states she is doing well today with minimal pain 2/10.  States she returns to MD on Friday.   Currently in Pain? Yes   Pain Score 2    Pain Location Knee   Pain Orientation Right   Pain Descriptors / Indicators Tightness;Aching                         [facility] Adult PT Treatment/Exercise - 01/07/17 0001      Knee/Hip Exercises: Stretches   Active Hamstring Stretch Right;3 reps;30 seconds   Active Hamstring Stretch Limitations 12in step   Knee: Self-Stretch to increase Flexion 10 seconds   Knee: Self-Stretch Limitations 10x 10" hold on 12in step   Gastroc Stretch 3 reps;30 seconds   Gastroc Stretch Limitations slant board     Knee/Hip Exercises: Standing   Knee Flexion Right;15 reps   Lateral Step Up Right;10 reps;Step Height: 4";Hand Hold: 1   Lateral Step Up Limitations 4" step   Forward Step Up Right;Hand Hold: 1;Step Height: 4";10 reps   Forward Step Up Limitations 4" step     Knee/Hip Exercises: Supine   Quad Sets 15 reps   Heel Slides Right;10 reps;AROM   Heel Slides Limitations 4-104  Knee Extension Limitations 4 degrees lacking   Knee Flexion Limitations 104 following manual      Manual Therapy   Manual Therapy Myofascial release;Edema management   Manual therapy comments done seperate from all other aspects of treatment.   Edema Management retro massage with LE elevated    Myofascial Release to decreasse adhesions and scar tissue to increase ROM                PT Education - 01/07/17 1757    Education provided Yes   Education Details order stockings if needed for swelling and continue with primary focus on ROM   Person(s) Educated Patient   Methods Explanation   Comprehension Verbalized understanding          PT Short Term Goals - 12/19/16 1611      PT SHORT TERM GOAL #1   Title Pt will consistently and independently perform HEP to  maximize return to PLOF.   Time 3   Period Weeks   Status On-going     PT SHORT TERM GOAL #2   Title Pt will have improved AROM from 5-100 in order to maximize gait and stair ambulation.    Time 3   Period Weeks   Status On-going     PT SHORT TERM GOAL #3   Title Pt will have improved SLS to 30 sec or > without requiring UE support in order to demonstrate improved balance and to maximize gait.   Time 3   Period Weeks   Status On-going           PT Long Term Goals - 12/19/16 1611      PT LONG TERM GOAL #1   Title Pt will have improved BLE MMT to 5/5 in order to maximize gait and functional tasks.   Time 6   Period Weeks   Status On-going     PT LONG TERM GOAL #2   Title Pt will perform the 5xSTS in 10 sec or < with no hands and with proper mechanics in order to demonstrate improved overall BLE strength and power.   Time 6   Period Weeks   Status On-going     PT LONG TERM GOAL #3   Title Pt will perform the TUG with LRAD in 10 sec or < with LRAD in order to demonstrate improved gait and balance in order to promote participation in community events.   Time 6   Period Weeks   Status On-going     PT LONG TERM GOAL #4   Title Pt will have improved 3MWT to at least 647ft with LRAD, proper gait mechanics, and with minimal to no pain  to demo improved BLE strength, endurance, and overall function.   Time 6   Period Weeks   Status On-going     PT LONG TERM GOAL #5   Title Pt will have improved AROM from 0-115 deg in order to maximize gait and overall function.   Time 6   Period Weeks   Status On-going               Plan - 01/07/17 1749    Clinical Impression Statement Continued with primary focus on ROM.  Pt reports good complaince with prone knee hang at home, although uncomfortable.  STates she has not ordered her hose yet but plans on calling.  One small steristrip left that came off during manual.  Scar is now completely healed.  Able to pop one area of scar  tissue during manual and achieved greater AROM following.    Rehab Potential Good   PT Frequency 3x / week   PT Duration 6 weeks   PT Treatment/Interventions ADLs/Self Care Home Management;Cryotherapy;Electrical Stimulation;DME Instruction;Gait training;Stair training;Functional mobility training;Therapeutic activities;Therapeutic exercise;Balance training;Neuromuscular re-education;Patient/family education;Manual techniques;Passive range of motion;Dry needling   PT Next Visit Plan Continue manual as needed to improve ROM.  F/u with compression hose purchase, answer questions concerning.  Continue with ROM based therex until range at least 5-115 degrees.  Re-eval next session for MD appt.   PT Home Exercise Plan eval: quad sets, heel slides, SAQ; hamstrings stretch, prone knee hang      Patient will benefit from skilled therapeutic intervention in order to improve the following deficits and impairments:  Abnormal gait, Decreased activity tolerance, Decreased balance, Decreased endurance, Decreased range of motion, Decreased strength, Difficulty walking, Hypomobility, Increased edema, Increased muscle spasms, Pain  Visit Diagnosis: Acute pain of right knee  Difficulty in walking, not elsewhere classified  Muscle weakness (generalized)  Localized edema     Problem List Patient Active Problem List   Diagnosis Date Noted  . Primary osteoarthritis of right knee 12/04/2016  . OA (osteoarthritis) of knee 12/03/2016  . LLQ abdominal pain 03/15/2016  . Lateral meniscal tear 10/19/2015  . Left hip pain 07/22/2013  . Occipital neuralgia 06/04/2013  . Leg pain 02/22/2012  . Gout 02/22/2012  . Weight gain 01/11/2012  . Edema leg 06/07/2011  . Pruritus 04/23/2011  . Preop exam for internal medicine 04/23/2011  . Pneumonia, organism unspecified(486) 04/21/2011  . Cough 04/19/2011  . Paresthesia 09/26/2010  . NECK PAIN 08/22/2010  . BACK PAIN, THORACIC REGION 08/22/2010  . Low back pain  08/22/2010  . DIZZINESS 08/22/2010  . Cerumen impaction 07/21/2010  . CRAMP OF LIMB 03/16/2010  . BRONCHITIS, ACUTE 07/22/2009  . COPD 07/22/2009  . MEDIASTINAL LYMPHADENOPATHY 08/16/2008  . Nonspecific (abnormal) findings on radiological and other examination of body structure 08/16/2008  . CT, CHEST, ABNORMAL 08/16/2008  . CHEST PAIN 06/24/2008  . BRADYCARDIA 06/23/2008  . GERD 06/23/2008  . SYNCOPE 06/23/2008  . B12 deficiency 04/09/2008  . Pain in Soft Tissues of Limb 01/08/2008  . RASH AND OTHER NONSPECIFIC SKIN ERUPTION 01/08/2008  . SINUSITIS, ACUTE 10/31/2007  . HIP PAIN 10/31/2007  . TOBACCO USE DISORDER/SMOKER-SMOKING CESSATION DISCUSSED 04/21/2007  . Essential hypertension 04/21/2007  . MENOPAUSAL SYNDROME 04/21/2007  . ALLERGIC RHINITIS 04/19/2007  . COLONIC POLYPS, HX OF 04/19/2007   Teena Irani, PTA/CLT [phone]  Teena Irani 01/07/2017, 5:58 PM  [place], [place], [address] Phone: [phone]   Fax:  [phone]  Name: Courtney Nielsen MRN: [id number] Date of Birth: [birth year]

## 2017-01-09 ENCOUNTER — Ambulatory Visit (HOSPITAL_COMMUNITY): Payer: BLUE CROSS/BLUE SHIELD

## 2017-01-09 DIAGNOSIS — M6281 Muscle weakness (generalized): Secondary | ICD-10-CM | POA: Diagnosis not present

## 2017-01-09 DIAGNOSIS — M25561 Pain in right knee: Secondary | ICD-10-CM

## 2017-01-09 DIAGNOSIS — R600 Localized edema: Secondary | ICD-10-CM

## 2017-01-09 DIAGNOSIS — M25661 Stiffness of right knee, not elsewhere classified: Secondary | ICD-10-CM | POA: Diagnosis not present

## 2017-01-09 DIAGNOSIS — R262 Difficulty in walking, not elsewhere classified: Secondary | ICD-10-CM

## 2017-01-09 NOTE — Patient Instructions (Signed)
Functional Quadriceps: Sit to Stand    Sit on edge of chair, feet flat on floor and equal stance. Stand upright, extending knees fully. Repeat 5  times per set. Do 2 sets per session.  http://orth.exer.us/735   Copyright  VHI. All rights reserved.

## 2017-01-09 NOTE — Therapy (Signed)
[place], [place], [address] Phone: [phone]   Fax:  [phone]  Physical Therapy Treatment    I spoke with Ihor Austin, PTA, prior to the reassessment regarding this patient. I have personally read and reviewed this note and agree with the findings and POC.  Geraldine Solar PT, DPT     Patient Details  Name: Courtney Nielsen MRN: [id number] Date of Birth: 08-26-1955 Referring Provider: Adrian Prince, MD  Encounter Date: 01/09/2017      PT End of Session - 01/09/17 1716    Visit Number 10   Number of Visits 19   Date for PT Re-Evaluation 01/09/17   Authorization Type BCBS Other   Authorization Time Period 12/17/16 to 01/28/17   Authorization - Visit Number 10   Authorization - Number of Visits 20   PT Start Time 8250   PT Stop Time 1735   PT Time Calculation (min) 45 min   Activity Tolerance Patient tolerated treatment well;No increased pain   Behavior During Therapy WFL for tasks assessed/performed      Past Medical History:  Diagnosis Date  . Allergic rhinitis   . Arthritis   . Chest pain   . Colonic polyp   . COPD (chronic obstructive pulmonary disease) ([place])    patient denies at preop on ;   . Dysrhythmia    hx of every third beat is " off" per patient   . Hypertension   . Pneumonia    hx of 2014   . Smoker   . Vitamin B12 deficiency     Past Surgical History:  Procedure Laterality Date  . CARDIAC CATHETERIZATION     cath negative per patient   . CATARACT EXTRACTION W/PHACO Right 06/11/2016   Procedure: CATARACT EXTRACTION PHACO AND INTRAOCULAR LENS PLACEMENT RIGHT EYE CDE=6.15;  Surgeon: Tonny Branch, MD;  Location: [facility];  Service: Ophthalmology;  Laterality: Right;  right  . COLONOSCOPY    . KNEE ARTHROSCOPY Right 10/19/2015   Procedure: RIGHT KNEE SCOPE WITH MENISCAL DEBRIDEMENT,LATERAL SYNOVECTOMY;  Surgeon: Gaynelle Arabian, MD;  Location: [facility];  Service: Orthopedics;   Laterality: Right;  . TOTAL KNEE ARTHROPLASTY Right 12/03/2016   Procedure: RIGHT TOTAL KNEE ARTHROPLASTY;  Surgeon: Gaynelle Arabian, MD;  Location: [facility];  Service: Orthopedics;  Laterality: Right;    There were no vitals filed for this visit.      Subjective Assessment - 01/09/17 1652    Subjective Pt stated current pain scale 2/10 mainly stiffness today.  MD apt this Friday   Pertinent History Rt knee scope 2017, GERD, HTN, COPD   How long can you sit comfortably? no issues   How long can you stand comfortably? no issues   How long can you walk comfortably? now walking without AD indoors, unsure of length of time she could walk for (she's been getting up every hour and walking a few laps up and down her hall)   Patient Stated Goals ROM, strengthening   Currently in Pain? Yes   Pain Score 2             [facility] PT Assessment - 01/09/17 0001      Assessment   Medical Diagnosis primary OA of R knee, s/p R TKA   Referring Provider Adrian Prince, MD   Onset Date/Surgical Date 12/03/16   Next MD Visit 01/11/17   Prior Therapy LBP at this clinic earlier this year     Precautions  Precautions None     Circumferential Edema   Circumferential - Right 17.5  17.5 joint line was 17.5 (was joint line and over bandage)   Circumferential - Left  --  was 15.5 (joint line)     AROM   AROM Assessment Site Knee   Right/Left Knee Right   Right Knee Extension 3  was 17   Right Knee Flexion 102  was 87     Palpation   Palpation comment increased soft tissue restrictions and tenderness to palpation of R distal quad, medial joint line     Transfers   Five time sit to stand comments  9.82 no UE support, elevated seated to 23in, cueing for equal stance/WB  was 12in BUE support     Ambulation/Gait   Ambulation Distance (Feet) 761 Feet  3MWT no AD; was 476 wiht RW     Standardized Balance Assessment   Standardized Balance Assessment Five Times Sit to Stand;Timed Up and Go Test   Five  times sit to stand comments  12 sec from chair with BUE support, RLE extended throughout  was 12 sec from chair with BUE support, RLE extended through     Timed Up and Go Test   TUG Normal TUG   Normal TUG (seconds) 8.68  was 12.7 wiht RW   TUG Comments TUG complete 01/09/17 no UE                     [facility] Adult PT Treatment/Exercise - 01/09/17 0001      Knee/Hip Exercises: Stretches   Active Hamstring Stretch Right;3 reps;30 seconds   Active Hamstring Stretch Limitations supine wiht sheet   Quad Stretch 3 reps;30 seconds   Quad Stretch Limitations prone with sheet   Knee: Self-Stretch to increase Flexion 10 seconds   Knee: Self-Stretch Limitations 10x 10" hold on 12in step   Gastroc Stretch 3 reps;30 seconds   Gastroc Stretch Limitations slant board     Knee/Hip Exercises: Standing   Terminal Knee Extension Limitations 10x5"     Knee/Hip Exercises: Supine   Short Arc Quad Sets 15 reps   Heel Slides Right;10 reps;AROM   Heel Slides Limitations 3-102     Manual Therapy   Manual Therapy Myofascial release;Edema management   Manual therapy comments done seperate from all other aspects of treatment.   Edema Management retro massage with LE elevated    Myofascial Release to decreasse adhesions and scar tissue to increase ROM                  PT Short Term Goals - 01/09/17 1701      PT SHORT TERM GOAL #1   Title Pt will consistently and independently perform HEP to maximize return to PLOF.   Baseline 01/09/17:  reports complaince daily   Status Achieved     PT SHORT TERM GOAL #2   Title Pt will have improved AROM from 5-100 in order to maximize gait and stair ambulation.    Baseline 01/09/17: 3-102 degrees (following manual for edema control)   Status Partially Met     PT SHORT TERM GOAL #3   Title Pt will have improved SLS to 30 sec or > without requiring UE support in order to demonstrate improved balance and to maximize gait.   Baseline 01/09/17:   Lt SLS 32" first attempt   Status Achieved           PT Long Term Goals - 01/09/17 1854  PT LONG TERM GOAL #1   Title Pt will have improved BLE MMT to 5/5 in order to maximize gait and functional tasks.   Status On-going     PT LONG TERM GOAL #2   Title Pt will perform the 5xSTS in 10 sec or < with no hands and with proper mechanics in order to demonstrate improved overall BLE strength and power.   Baseline 01/09/17: 5 STS 9.82" elevated to 23in without hand, cueing to equalize weight bearing   Status On-going     PT LONG TERM GOAL #3   Title Pt will perform the TUG with LRAD in 10 sec or < with LRAD in order to demonstrate improved gait and balance in order to promote participation in community events.   Baseline 01/09/17: TUG no AD elevated seat height to 23in 8.68"   Status On-going     PT LONG TERM GOAL #4   Title Pt will have improved 3MWT to at least 635f with LRAD, proper gait mechanics, and with minimal to no pain  to demo improved BLE strength, endurance, and overall function.   Baseline 01/09/17: 7613f3MWT with verbal cueing for gait mechanics to equalize stance phase and stride length   Status Partially Met     PT LONG TERM GOAL #5   Title Pt will have improved AROM from 0-115 deg in order to maximize gait and overall function.   Status On-going               Plan - 01/09/17 1845    Clinical Impression Statement Reassessment complete prior MD apt 01/11/17 wiht the following findings:  Pt reports compliance wiht HEP and able to verbalize/demonstrate appropriate technique with all exercises.  AROM 3-102 degrees.  Pt continues to display increased edema present proximal knee with some scar tissue adhesions present.  Manual technqiues complete this session for edema and MFR to address adhesions with improved AROM following.  Pt has been educated on benefits of compression hose and measurements have been taken, pt plans on purchasing soon.  Pt now ambulates with SPC  indoors and outdoors and has began ambulating no AD for short distances.  Pt with vast improvements with functional performance including increased speed and distance with 5 STS, TUG and 3MWT.  Pt will continue to benefits from skilled intervention to improve AROM, gait mechanics to equalize stance phase and functional strengthening.     Rehab Potential Good   PT Frequency 3x / week   PT Duration 6 weeks   PT Treatment/Interventions ADLs/Self Care Home Management;Cryotherapy;Electrical Stimulation;DME Instruction;Gait training;Stair training;Functional mobility training;Therapeutic activities;Therapeutic exercise;Balance training;Neuromuscular re-education;Patient/family education;Manual techniques;Passive range of motion;Dry needling   PT Next Visit Plan F/U with MD and purchase of compression hose, answer questions concerning.  Continue with ROM based therex until range at least 5-115 degrees.   PT Home Exercise Plan eval: quad sets, heel slides, SAQ; hamstrings stretch, prone knee hang; STS with focus equal weight bearing      Patient will benefit from skilled therapeutic intervention in order to improve the following deficits and impairments:  Abnormal gait, Decreased activity tolerance, Decreased balance, Decreased endurance, Decreased range of motion, Decreased strength, Difficulty walking, Hypomobility, Increased edema, Increased muscle spasms, Pain  Visit Diagnosis: Acute pain of right knee  Difficulty in walking, not elsewhere classified  Muscle weakness (generalized)  Localized edema  Stiffness of right knee, not elsewhere classified     Problem List Patient Active Problem List   Diagnosis Date Noted  . Primary  osteoarthritis of right knee 12/04/2016  . OA (osteoarthritis) of knee 12/03/2016  . LLQ abdominal pain 03/15/2016  . Lateral meniscal tear 10/19/2015  . Left hip pain 07/22/2013  . Occipital neuralgia 06/04/2013  . Leg pain 02/22/2012  . Gout 02/22/2012  .  Weight gain 01/11/2012  . Edema leg 06/07/2011  . Pruritus 04/23/2011  . Preop exam for internal medicine 04/23/2011  . Pneumonia, organism unspecified(486) 04/21/2011  . Cough 04/19/2011  . Paresthesia 09/26/2010  . NECK PAIN 08/22/2010  . BACK PAIN, THORACIC REGION 08/22/2010  . Low back pain 08/22/2010  . DIZZINESS 08/22/2010  . Cerumen impaction 07/21/2010  . CRAMP OF LIMB 03/16/2010  . BRONCHITIS, ACUTE 07/22/2009  . COPD 07/22/2009  . MEDIASTINAL LYMPHADENOPATHY 08/16/2008  . Nonspecific (abnormal) findings on radiological and other examination of body structure 08/16/2008  . CT, CHEST, ABNORMAL 08/16/2008  . CHEST PAIN 06/24/2008  . BRADYCARDIA 06/23/2008  . GERD 06/23/2008  . SYNCOPE 06/23/2008  . B12 deficiency 04/09/2008  . Pain in Soft Tissues of Limb 01/08/2008  . RASH AND OTHER NONSPECIFIC SKIN ERUPTION 01/08/2008  . SINUSITIS, ACUTE 10/31/2007  . HIP PAIN 10/31/2007  . TOBACCO USE DISORDER/SMOKER-SMOKING CESSATION DISCUSSED 04/21/2007  . Essential hypertension 04/21/2007  . MENOPAUSAL SYNDROME 04/21/2007  . ALLERGIC RHINITIS 04/19/2007  . COLONIC POLYPS, HX OF 04/19/2007   Ihor Austin, LPTA; [place]  Aldona Lento 01/09/2017, 7:00 PM  [facility] [address] [place], [place], [address] Phone: [phone]   Fax:  [phone]  Name: Courtney Nielsen MRN: [id number] Date of Birth: 08/07/1955

## 2017-01-11 ENCOUNTER — Ambulatory Visit (HOSPITAL_COMMUNITY): Payer: BLUE CROSS/BLUE SHIELD

## 2017-01-11 DIAGNOSIS — R262 Difficulty in walking, not elsewhere classified: Secondary | ICD-10-CM | POA: Diagnosis not present

## 2017-01-11 DIAGNOSIS — M6281 Muscle weakness (generalized): Secondary | ICD-10-CM | POA: Diagnosis not present

## 2017-01-11 DIAGNOSIS — M25561 Pain in right knee: Secondary | ICD-10-CM | POA: Diagnosis not present

## 2017-01-11 DIAGNOSIS — R600 Localized edema: Secondary | ICD-10-CM

## 2017-01-11 DIAGNOSIS — M25661 Stiffness of right knee, not elsewhere classified: Secondary | ICD-10-CM | POA: Diagnosis not present

## 2017-01-11 DIAGNOSIS — M1711 Unilateral primary osteoarthritis, right knee: Secondary | ICD-10-CM | POA: Diagnosis not present

## 2017-01-11 NOTE — Therapy (Signed)
[place], [place], [address] Phone: [phone]   Fax:  [phone]  Physical Therapy Treatment  Patient Details  Name: Courtney Nielsen MRN: [id number] Date of Birth: Oct 10, 1955 Referring Provider: Adrian Prince, MD  Encounter Date: 01/11/2017      PT End of Session - 01/11/17 1618    Visit Number 11   Number of Visits 19   Date for PT Re-Evaluation 01/28/17   Authorization Type BCBS Other   Authorization Time Period 12/17/16 to 01/28/17   Authorization - Visit Number 11   Authorization - Number of Visits 20   PT Start Time 1601   PT Stop Time 1648   PT Time Calculation (min) 47 min   Activity Tolerance Patient tolerated treatment well;No increased pain   Behavior During Therapy WFL for tasks assessed/performed      Past Medical History:  Diagnosis Date  . Allergic rhinitis   . Arthritis   . Chest pain   . Colonic polyp   . COPD (chronic obstructive pulmonary disease) ([place])    patient denies at preop on ;   . Dysrhythmia    hx of every third beat is " off" per patient   . Hypertension   . Pneumonia    hx of 2014   . Smoker   . Vitamin B12 deficiency     Past Surgical History:  Procedure Laterality Date  . CARDIAC CATHETERIZATION     cath negative per patient   . CATARACT EXTRACTION W/PHACO Right 06/11/2016   Procedure: CATARACT EXTRACTION PHACO AND INTRAOCULAR LENS PLACEMENT RIGHT EYE CDE=6.15;  Surgeon: Tonny Branch, MD;  Location: [facility];  Service: Ophthalmology;  Laterality: Right;  right  . COLONOSCOPY    . KNEE ARTHROSCOPY Right 10/19/2015   Procedure: RIGHT KNEE SCOPE WITH MENISCAL DEBRIDEMENT,LATERAL SYNOVECTOMY;  Surgeon: Gaynelle Arabian, MD;  Location: [facility];  Service: Orthopedics;  Laterality: Right;  . TOTAL KNEE ARTHROPLASTY Right 12/03/2016   Procedure: RIGHT TOTAL KNEE ARTHROPLASTY;  Surgeon: Gaynelle Arabian, MD;  Location: [facility];  Service: Orthopedics;  Laterality: Right;    There  were no vitals filed for this visit.      Subjective Assessment - 01/11/17 1601    Subjective Pt stated MD happy with progress.  Has ordered compression house.  minimal pain today mainly tight.  Pt allowed to return driving and RTW at home full down.     Pertinent History Rt knee scope 2017, GERD, HTN, COPD   Patient Stated Goals ROM, strengthening   Currently in Pain? No/denies   Pain Descriptors / Indicators Tightness             [facility] Adult PT Treatment/Exercise - 01/11/17 0001      Knee/Hip Exercises: Stretches   Quad Stretch 3 reps;30 seconds   Quad Stretch Limitations prone with sheet   Knee: Self-Stretch to increase Flexion 10 seconds   Knee: Self-Stretch Limitations knee drives 09N 10" hold on 12in step     Knee/Hip Exercises: Aerobic   Stationary Bike 4' at [employer], no charge     Knee/Hip Exercises: Standing   Knee Flexion Right;15 reps   Terminal Knee Extension Limitations 10x5"   Lateral Step Up Right;10 reps;Step Height: 4";Hand Hold: 1   Lateral Step Up Limitations 4" step   Forward Step Up Right;Hand Hold: 1;Step Height: 4";10 reps   Forward Step Up Limitations 4" step     Knee/Hip Exercises: Supine   Heel Slides Right;10 reps;AROM  Terminal Knee Extension AAROM;Right;5 reps   Knee Extension Limitations 3 degrees   Knee Flexion Limitations 108 following manual     Knee/Hip Exercises: Prone   Contract/Relax to Increase Flexion 5x     Manual Therapy   Manual Therapy Myofascial release;Edema management   Manual therapy comments done seperate from all other aspects of treatment.   Edema Management retro massage with LE elevated    Myofascial Release to decreasse adhesions and scar tissue to increase ROM                  PT Short Term Goals - 01/09/17 1701      PT SHORT TERM GOAL #1   Title Pt will consistently and independently perform HEP to maximize return to PLOF.   Baseline 01/09/17:  reports complaince daily   Status Achieved     PT  SHORT TERM GOAL #2   Title Pt will have improved AROM from 5-100 in order to maximize gait and stair ambulation.    Baseline 01/09/17: 3-102 degrees (following manual for edema control)   Status Partially Met     PT SHORT TERM GOAL #3   Title Pt will have improved SLS to 30 sec or > without requiring UE support in order to demonstrate improved balance and to maximize gait.   Baseline 01/09/17:  Lt SLS 32" first attempt   Status Achieved           PT Long Term Goals - 01/09/17 1854      PT LONG TERM GOAL #1   Title Pt will have improved BLE MMT to 5/5 in order to maximize gait and functional tasks.   Status On-going     PT LONG TERM GOAL #2   Title Pt will perform the 5xSTS in 10 sec or < with no hands and with proper mechanics in order to demonstrate improved overall BLE strength and power.   Baseline 01/09/17: 5 STS 9.82" elevated to 23in without hand, cueing to equalize weight bearing   Status On-going     PT LONG TERM GOAL #3   Title Pt will perform the TUG with LRAD in 10 sec or < with LRAD in order to demonstrate improved gait and balance in order to promote participation in community events.   Baseline 01/09/17: TUG no AD elevated seat height to 23in 8.68"   Status On-going     PT LONG TERM GOAL #4   Title Pt will have improved 3MWT to at least 635f with LRAD, proper gait mechanics, and with minimal to no pain  to demo improved BLE strength, endurance, and overall function.   Baseline 01/09/17: 7618f3MWT with verbal cueing for gait mechanics to equalize stance phase and stride length   Status Partially Met     PT LONG TERM GOAL #5   Title Pt will have improved AROM from 0-115 deg in order to maximize gait and overall function.   Status On-going               Plan - 01/11/17 1730    Clinical Impression Statement Pt reports MD happy wiht progress and has ordered compression hose to assist with edema.  Continued session focus with knee mobility primarly flexion as pt  is at 3 degrees lacking with extension.  Pt presents wiht increased edema present proximal knee.  Began session wiht retro massage for edema control prior ROM based therex.  Positive results following contract/relax technqiues with improved range to 108 degrees flexion.  Added bicycle for flexibilty and encouraged pt to begin at home.  Pt stated she has 4 sessions left per insurance so did begin some functional strengthening activities including squats and step training.  Pt given updated HEP to progress flexion and functional strengthening.  No reports of increased pain thruogh session.     Rehab Potential Good   PT Frequency 3x / week   PT Duration 6 weeks   PT Treatment/Interventions ADLs/Self Care Home Management;Cryotherapy;Electrical Stimulation;DME Instruction;Gait training;Stair training;Functional mobility training;Therapeutic activities;Therapeutic exercise;Balance training;Neuromuscular re-education;Patient/family education;Manual techniques;Passive range of motion;Dry needling   PT Next Visit Plan F/U wiht compliance with new HEP and compression stocking.  Continue with ROM flexion based therex, next session add lunges, squats and progress step training as pt reports 4 session per insurance.     PT Home Exercise Plan eval: quad sets, heel slides, SAQ; hamstrings stretch, prone knee hang; STS with focus equal weight bearing; begin bike at home, standing TKE, quad stretch and STS with equal weight bearing      Patient will benefit from skilled therapeutic intervention in order to improve the following deficits and impairments:  Abnormal gait, Decreased activity tolerance, Decreased balance, Decreased endurance, Decreased range of motion, Decreased strength, Difficulty walking, Hypomobility, Increased edema, Increased muscle spasms, Pain  Visit Diagnosis: Acute pain of right knee  Difficulty in walking, not elsewhere classified  Muscle weakness (generalized)  Localized edema  Stiffness  of right knee, not elsewhere classified     Problem List Patient Active Problem List   Diagnosis Date Noted  . Primary osteoarthritis of right knee 12/04/2016  . OA (osteoarthritis) of knee 12/03/2016  . LLQ abdominal pain 03/15/2016  . Lateral meniscal tear 10/19/2015  . Left hip pain 07/22/2013  . Occipital neuralgia 06/04/2013  . Leg pain 02/22/2012  . Gout 02/22/2012  . Weight gain 01/11/2012  . Edema leg 06/07/2011  . Pruritus 04/23/2011  . Preop exam for internal medicine 04/23/2011  . Pneumonia, organism unspecified(486) 04/21/2011  . Cough 04/19/2011  . Paresthesia 09/26/2010  . NECK PAIN 08/22/2010  . BACK PAIN, THORACIC REGION 08/22/2010  . Low back pain 08/22/2010  . DIZZINESS 08/22/2010  . Cerumen impaction 07/21/2010  . CRAMP OF LIMB 03/16/2010  . BRONCHITIS, ACUTE 07/22/2009  . COPD 07/22/2009  . MEDIASTINAL LYMPHADENOPATHY 08/16/2008  . Nonspecific (abnormal) findings on radiological and other examination of body structure 08/16/2008  . CT, CHEST, ABNORMAL 08/16/2008  . CHEST PAIN 06/24/2008  . BRADYCARDIA 06/23/2008  . GERD 06/23/2008  . SYNCOPE 06/23/2008  . B12 deficiency 04/09/2008  . Pain in Soft Tissues of Limb 01/08/2008  . RASH AND OTHER NONSPECIFIC SKIN ERUPTION 01/08/2008  . SINUSITIS, ACUTE 10/31/2007  . HIP PAIN 10/31/2007  . TOBACCO USE DISORDER/SMOKER-SMOKING CESSATION DISCUSSED 04/21/2007  . Essential hypertension 04/21/2007  . MENOPAUSAL SYNDROME 04/21/2007  . ALLERGIC RHINITIS 04/19/2007  . COLONIC POLYPS, HX OF 04/19/2007   Ihor Austin, LPTA; [place]  Aldona Lento 01/11/2017, 5:38 PM  [facility] [place], [place], [address] Phone: [phone]   Fax:  [phone]  Name: Courtney Nielsen MRN: [id number] Date of Birth: 12-Jul-1955

## 2017-01-11 NOTE — Patient Instructions (Signed)
Knee / Archie Balboa on stomach, knees together. Grab one ankle with same side hand. Use towel if needed to reach. Gently pull foot toward buttock. Hold 30 seconds.  Repeat 3 times. Do 2 sessions per day.  Copyright  VHI. All rights reserved.   Functional Quadriceps: Sit to Stand    Sit on edge of chair, feet flat on floor. Stand upright, extending knees fully. Repeat 10 times per set. Do 2 sets per day http://orth.exer.us/735   Copyright  VHI. All rights reserved.   Begin riding your bicycle  Knee Extension: Terminal - Standing (Single Leg)    Face anchor in shoulder width stance, band around knee. Allow tension of band to slightly bend knee. Pull leg back, straightening knee. Repeat 10 times per set. Repeat with other leg. Do 2 sets per session.  Anchor Height: Knee  http://tub.exer.us/36   Copyright  VHI. All rights reserved.

## 2017-01-14 ENCOUNTER — Ambulatory Visit (HOSPITAL_COMMUNITY): Payer: BLUE CROSS/BLUE SHIELD

## 2017-01-14 DIAGNOSIS — M25661 Stiffness of right knee, not elsewhere classified: Secondary | ICD-10-CM

## 2017-01-14 DIAGNOSIS — M25561 Pain in right knee: Secondary | ICD-10-CM | POA: Diagnosis not present

## 2017-01-14 DIAGNOSIS — M6281 Muscle weakness (generalized): Secondary | ICD-10-CM | POA: Diagnosis not present

## 2017-01-14 DIAGNOSIS — R600 Localized edema: Secondary | ICD-10-CM

## 2017-01-14 DIAGNOSIS — R262 Difficulty in walking, not elsewhere classified: Secondary | ICD-10-CM | POA: Diagnosis not present

## 2017-01-14 NOTE — Therapy (Signed)
[place], [place], [address] Phone: [phone]   Fax:  [phone]  Physical Therapy Treatment  Patient Details  Name: Courtney Nielsen MRN: [id number] Date of Birth: [birth year] Referring Provider: Adrian Prince, MD  Encounter Date: 01/14/2017      PT End of Session - 01/14/17 1123    Visit Number 12   Number of Visits 19   Date for PT Re-Evaluation 01/28/17   Authorization Type BCBS Other   Authorization Time Period 12/17/16 to 01/28/17   Authorization - Visit Number 12   Authorization - Number of Visits 20   PT Start Time 8182   PT Stop Time 9937   PT Time Calculation (min) 44 min   Activity Tolerance Patient tolerated treatment well;No increased pain   Behavior During Therapy WFL for tasks assessed/performed      Past Medical History:  Diagnosis Date  . Allergic rhinitis   . Arthritis   . Chest pain   . Colonic polyp   . COPD (chronic obstructive pulmonary disease) ([place])    patient denies at preop on ;   . Dysrhythmia    hx of every third beat is " off" per patient   . Hypertension   . Pneumonia    hx of 2014   . Smoker   . Vitamin B12 deficiency     Past Surgical History:  Procedure Laterality Date  . CARDIAC CATHETERIZATION     cath negative per patient   . CATARACT EXTRACTION W/PHACO Right 06/11/2016   Procedure: CATARACT EXTRACTION PHACO AND INTRAOCULAR LENS PLACEMENT RIGHT EYE CDE=6.15;  Surgeon: Tonny Branch, MD;  Location: [facility];  Service: Ophthalmology;  Laterality: Right;  right  . COLONOSCOPY    . KNEE ARTHROSCOPY Right 10/19/2015   Procedure: RIGHT KNEE SCOPE WITH MENISCAL DEBRIDEMENT,LATERAL SYNOVECTOMY;  Surgeon: Gaynelle Arabian, MD;  Location: [facility];  Service: Orthopedics;  Laterality: Right;  . TOTAL KNEE ARTHROPLASTY Right 12/03/2016   Procedure: RIGHT TOTAL KNEE ARTHROPLASTY;  Surgeon: Gaynelle Arabian, MD;  Location: [facility];  Service: Orthopedics;  Laterality: Right;    There  were no vitals filed for this visit.      Subjective Assessment - 01/14/17 1121    Subjective Able to make full revolution on bike at home, no reoprts of pain today, mainly stiffness   Pertinent History Rt knee scope 2017, GERD, HTN, COPD   Patient Stated Goals ROM, strengthening   Currently in Pain? No/denies   Pain Descriptors / Indicators Tightness  Stiffness                         [facility] Adult PT Treatment/Exercise - 01/14/17 0001      Knee/Hip Exercises: Stretches   Quad Stretch 3 reps;30 seconds   Quad Stretch Limitations prone with sheet   Knee: Self-Stretch to increase Flexion 10 seconds   Knee: Self-Stretch Limitations knee drives 16R 10" hold on 12in step     Knee/Hip Exercises: Standing   Terminal Knee Extension Limitations 10x5"   Lateral Step Up Right;10 reps;Step Height: 4";Hand Hold: 1   Lateral Step Up Limitations 4" step   Forward Step Up Right;Hand Hold: 1;10 reps;Step Height: 6"   Forward Step Up Limitations 6in step   Functional Squat 10 reps   Functional Squat Limitations verbal, tactile cueing for equal weight bearing   Rocker Board 2 minutes   Rocker Board Limitations R/L   SLS Lt 34", Rt  39" max of 3     Knee/Hip Exercises: Seated   Sit to Sand without UE support;2 sets;5 reps  1st set on foam; 2nd set with pillow cueing for equal weight     Knee/Hip Exercises: Supine   Heel Slides Right;10 reps;AROM   Knee Extension Limitations 3 degrees   Knee Flexion Limitations 108 following manual     Knee/Hip Exercises: Prone   Contract/Relax to Increase Flexion 5x     Manual Therapy   Manual Therapy --   Manual therapy comments done seperate from all other aspects of treatment.   Edema Management retro massage with LE elevated    Joint Mobilization patella mobs                  PT Short Term Goals - 01/09/17 1701      PT SHORT TERM GOAL #1   Title Pt will consistently and independently perform HEP to maximize return to  PLOF.   Baseline 01/09/17:  reports complaince daily   Status Achieved     PT SHORT TERM GOAL #2   Title Pt will have improved AROM from 5-100 in order to maximize gait and stair ambulation.    Baseline 01/09/17: 3-102 degrees (following manual for edema control)   Status Partially Met     PT SHORT TERM GOAL #3   Title Pt will have improved SLS to 30 sec or > without requiring UE support in order to demonstrate improved balance and to maximize gait.   Baseline 01/09/17:  Lt SLS 32" first attempt   Status Achieved           PT Long Term Goals - 01/09/17 1854      PT LONG TERM GOAL #1   Title Pt will have improved BLE MMT to 5/5 in order to maximize gait and functional tasks.   Status On-going     PT LONG TERM GOAL #2   Title Pt will perform the 5xSTS in 10 sec or < with no hands and with proper mechanics in order to demonstrate improved overall BLE strength and power.   Baseline 01/09/17: 5 STS 9.82" elevated to 23in without hand, cueing to equalize weight bearing   Status On-going     PT LONG TERM GOAL #3   Title Pt will perform the TUG with LRAD in 10 sec or < with LRAD in order to demonstrate improved gait and balance in order to promote participation in community events.   Baseline 01/09/17: TUG no AD elevated seat height to 23in 8.68"   Status On-going     PT LONG TERM GOAL #4   Title Pt will have improved 3MWT to at least 618f with LRAD, proper gait mechanics, and with minimal to no pain  to demo improved BLE strength, endurance, and overall function.   Baseline 01/09/17: [id number] with verbal cueing for gait mechanics to equalize stance phase and stride length   Status Partially Met     PT LONG TERM GOAL #5   Title Pt will have improved AROM from 0-115 deg in order to maximize gait and overall function.   Status On-going               Plan - 01/14/17 1212    Clinical Impression Statement AROM 3-108 degrees following stretches and manual.  Continued with manual  technqiues to address edema present proximal knee, joint mobs and contract/relax techqniues to improve flexion.  Pt waiting for compression hose to arrive in mail  so reduced time wiht retro massage with increased focus on functional strengtheing this session.  Added squats for gluteal strengthneing and able to reduce height with sit to stand, cueing to equalize weight bearing.  Able to increase height with step up without difficulty.  EOS pt was limited by fatigue.   Rehab Potential Good   PT Frequency 3x / week   PT Duration 6 weeks   PT Treatment/Interventions ADLs/Self Care Home Management;Cryotherapy;Electrical Stimulation;DME Instruction;Gait training;Stair training;Functional mobility training;Therapeutic activities;Therapeutic exercise;Balance training;Neuromuscular re-education;Patient/family education;Manual techniques;Passive range of motion;Dry needling   PT Next Visit Plan Continue with ROM based therex, positive results with contract/relax for flexion.  Increase functional strengthening as pt reports 3 sessions left per insurance.     PT Home Exercise Plan eval: quad sets, heel slides, SAQ; hamstrings stretch, prone knee hang; STS with focus equal weight bearing; begin bike at home, standing TKE, quad stretch and STS with equal weight bearing; SLS      Patient will benefit from skilled therapeutic intervention in order to improve the following deficits and impairments:  Abnormal gait, Decreased activity tolerance, Decreased balance, Decreased endurance, Decreased range of motion, Decreased strength, Difficulty walking, Hypomobility, Increased edema, Increased muscle spasms, Pain  Visit Diagnosis: Acute pain of right knee  Difficulty in walking, not elsewhere classified  Muscle weakness (generalized)  Localized edema  Stiffness of right knee, not elsewhere classified     Problem List Patient Active Problem List   Diagnosis Date Noted  . Primary osteoarthritis of right knee  12/04/2016  . OA (osteoarthritis) of knee 12/03/2016  . LLQ abdominal pain 03/15/2016  . Lateral meniscal tear 10/19/2015  . Left hip pain 07/22/2013  . Occipital neuralgia 06/04/2013  . Leg pain 02/22/2012  . Gout 02/22/2012  . Weight gain 01/11/2012  . Edema leg 06/07/2011  . Pruritus 04/23/2011  . Preop exam for internal medicine 04/23/2011  . Pneumonia, organism unspecified(486) 04/21/2011  . Cough 04/19/2011  . Paresthesia 09/26/2010  . NECK PAIN 08/22/2010  . BACK PAIN, THORACIC REGION 08/22/2010  . Low back pain 08/22/2010  . DIZZINESS 08/22/2010  . Cerumen impaction 07/21/2010  . CRAMP OF LIMB 03/16/2010  . BRONCHITIS, ACUTE 07/22/2009  . COPD 07/22/2009  . MEDIASTINAL LYMPHADENOPATHY 08/16/2008  . Nonspecific (abnormal) findings on radiological and other examination of body structure 08/16/2008  . CT, CHEST, ABNORMAL 08/16/2008  . CHEST PAIN 06/24/2008  . BRADYCARDIA 06/23/2008  . GERD 06/23/2008  . SYNCOPE 06/23/2008  . B12 deficiency 04/09/2008  . Pain in Soft Tissues of Limb 01/08/2008  . RASH AND OTHER NONSPECIFIC SKIN ERUPTION 01/08/2008  . SINUSITIS, ACUTE 10/31/2007  . HIP PAIN 10/31/2007  . TOBACCO USE DISORDER/SMOKER-SMOKING CESSATION DISCUSSED 04/21/2007  . Essential hypertension 04/21/2007  . MENOPAUSAL SYNDROME 04/21/2007  . ALLERGIC RHINITIS 04/19/2007  . COLONIC POLYPS, HX OF 04/19/2007   Ihor Austin, LPTA; [place]  Aldona Lento 01/14/2017, 12:25 PM  [place], [place], [address] Phone: [phone]   Fax:  [phone]  Name: Courtney Nielsen MRN: [id number] Date of Birth: 07/31/1955

## 2017-01-18 ENCOUNTER — Ambulatory Visit (HOSPITAL_COMMUNITY): Payer: BLUE CROSS/BLUE SHIELD

## 2017-01-18 DIAGNOSIS — R262 Difficulty in walking, not elsewhere classified: Secondary | ICD-10-CM

## 2017-01-18 DIAGNOSIS — M6281 Muscle weakness (generalized): Secondary | ICD-10-CM

## 2017-01-18 DIAGNOSIS — R600 Localized edema: Secondary | ICD-10-CM

## 2017-01-18 DIAGNOSIS — M25561 Pain in right knee: Secondary | ICD-10-CM | POA: Diagnosis not present

## 2017-01-18 DIAGNOSIS — M25661 Stiffness of right knee, not elsewhere classified: Secondary | ICD-10-CM | POA: Diagnosis not present

## 2017-01-18 NOTE — Therapy (Signed)
[place], [place], [address] Phone: [phone]   Fax:  [phone]  Physical Therapy Treatment  Patient Details  Name: Courtney Nielsen MRN: [id number] Date of Birth: Apr 18, 1956 Referring Provider: Adrian Prince, MD  Encounter Date: 01/18/2017      PT End of Session - 01/18/17 0952    Visit Number 13   Number of Visits 19   Date for PT Re-Evaluation 01/28/17   Authorization Type BCBS Other   Authorization Time Period 12/17/16 to 01/28/17   Authorization - Visit Number 13   Authorization - Number of Visits 20   PT Start Time 0950   PT Stop Time 3192   PT Time Calculation (min) 42 min   Activity Tolerance Patient tolerated treatment well;No increased pain   Behavior During Therapy WFL for tasks assessed/performed      Past Medical History:  Diagnosis Date  . Allergic rhinitis   . Arthritis   . Chest pain   . Colonic polyp   . COPD (chronic obstructive pulmonary disease) ([place])    patient denies at preop on ;   . Dysrhythmia    hx of every third beat is " off" per patient   . Hypertension   . Pneumonia    hx of 2014   . Smoker   . Vitamin B12 deficiency     Past Surgical History:  Procedure Laterality Date  . CARDIAC CATHETERIZATION     cath negative per patient   . CATARACT EXTRACTION W/PHACO Right 06/11/2016   Procedure: CATARACT EXTRACTION PHACO AND INTRAOCULAR LENS PLACEMENT RIGHT EYE CDE=6.15;  Surgeon: Tonny Branch, MD;  Location: [facility];  Service: Ophthalmology;  Laterality: Right;  right  . COLONOSCOPY    . KNEE ARTHROSCOPY Right 10/19/2015   Procedure: RIGHT KNEE SCOPE WITH MENISCAL DEBRIDEMENT,LATERAL SYNOVECTOMY;  Surgeon: Gaynelle Arabian, MD;  Location: [facility];  Service: Orthopedics;  Laterality: Right;  . TOTAL KNEE ARTHROPLASTY Right 12/03/2016   Procedure: RIGHT TOTAL KNEE ARTHROPLASTY;  Surgeon: Gaynelle Arabian, MD;  Location: [facility];  Service: Orthopedics;  Laterality: Right;    There  were no vitals filed for this visit.      Subjective Assessment - 01/18/17 0948    Subjective Pt stated she did receive the compression hose but has difficulty putting on.  Knee is tight today, continues to ride bike daily.     Pertinent History Rt knee scope 2017, GERD, HTN, COPD   Patient Stated Goals ROM, strengthening   Currently in Pain? No/denies                         [facility] Adult PT Treatment/Exercise - 01/18/17 0001      Knee/Hip Exercises: Stretches   Active Hamstring Stretch Right;3 reps;30 seconds   Active Hamstring Stretch Limitations supine wiht sheet   Quad Stretch 3 reps;30 seconds   Quad Stretch Limitations prone with sheet   Knee: Self-Stretch to increase Flexion 10 seconds   Knee: Self-Stretch Limitations knee drives 43O 10" hold on 12in step   Gastroc Stretch 3 reps;30 seconds   Gastroc Stretch Limitations slant board     Knee/Hip Exercises: Aerobic   Stationary Bike not available, resume next session     Knee/Hip Exercises: Standing   Forward Lunges 10 reps   Terminal Knee Extension Limitations 10x5"   Lateral Step Up Right;10 reps;Step Height: 4";Hand Hold: 1   Lateral Step Up Limitations 4" step  Forward Step Up Right;Hand Hold: 1;10 reps;Step Height: 6"   Forward Step Up Limitations 6in step   Functional Squat 10 reps   Functional Squat Limitations verbal, tactile cueing for equal weight bearing   Stairs 3RT 7in step height 1 HR     Knee/Hip Exercises: Seated   Sit to Sand 10 reps;without UE support  equal stance and weight bearing     Knee/Hip Exercises: Supine   Knee Extension Limitations 3 degrees   Knee Flexion Limitations 108 following manual     Knee/Hip Exercises: Prone   Contract/Relax to Increase Flexion 5x     Manual Therapy   Manual Therapy Myofascial release;Edema management;Passive ROM   Manual therapy comments done seperate from all other aspects of treatment.   Edema Management retro massage with LE elevated     Joint Mobilization patella mobs   Passive ROM contract/relax                  PT Short Term Goals - 01/09/17 1701      PT SHORT TERM GOAL #1   Title Pt will consistently and independently perform HEP to maximize return to PLOF.   Baseline 01/09/17:  reports complaince daily   Status Achieved     PT SHORT TERM GOAL #2   Title Pt will have improved AROM from 5-100 in order to maximize gait and stair ambulation.    Baseline 01/09/17: 3-102 degrees (following manual for edema control)   Status Partially Met     PT SHORT TERM GOAL #3   Title Pt will have improved SLS to 30 sec or > without requiring UE support in order to demonstrate improved balance and to maximize gait.   Baseline 01/09/17:  Lt SLS 32" first attempt   Status Achieved           PT Long Term Goals - 01/09/17 1854      PT LONG TERM GOAL #1   Title Pt will have improved BLE MMT to 5/5 in order to maximize gait and functional tasks.   Status On-going     PT LONG TERM GOAL #2   Title Pt will perform the 5xSTS in 10 sec or < with no hands and with proper mechanics in order to demonstrate improved overall BLE strength and power.   Baseline 01/09/17: 5 STS 9.82" elevated to 23in without hand, cueing to equalize weight bearing   Status On-going     PT LONG TERM GOAL #3   Title Pt will perform the TUG with LRAD in 10 sec or < with LRAD in order to demonstrate improved gait and balance in order to promote participation in community events.   Baseline 01/09/17: TUG no AD elevated seat height to 23in 8.68"   Status On-going     PT LONG TERM GOAL #4   Title Pt will have improved 3MWT to at least 628f with LRAD, proper gait mechanics, and with minimal to no pain  to demo improved BLE strength, endurance, and overall function.   Baseline 01/09/17: [id number] with verbal cueing for gait mechanics to equalize stance phase and stride length   Status Partially Met     PT LONG TERM GOAL #5   Title Pt will have  improved AROM from 0-115 deg in order to maximize gait and overall function.   Status On-going               Plan - 01/18/17 1012    Clinical Impression Statement Pt arrived  with reports of increased stiffness feels due to early time of morning and noted edema present proximal knee.  Began session with manual techqniques to address edema and joint mobs prior ROM and functional strengthening.  Instructed techqniues to increase ease placing compression hose on LE to assist with edema control.  AROM 2-108 degrees following manual.  Progressed to functional strengtheing wiht cueing for equal stance phase with squats, improved weight bearing noted wiht sit to stands.  Added lunges for functional strengthening.     Rehab Potential Good   PT Frequency 3x / week   PT Duration 6 weeks   PT Treatment/Interventions ADLs/Self Care Home Management;Cryotherapy;Electrical Stimulation;DME Instruction;Gait training;Stair training;Functional mobility training;Therapeutic activities;Therapeutic exercise;Balance training;Neuromuscular re-education;Patient/family education;Manual techniques;Passive range of motion;Dry needling   PT Next Visit Plan next session f/u with compression hose.  Pt to bring family member for instructions on contract/relax technique to begin at home.   Continue with ROM based therex, positive results with contract/relax for flexion.  Increase functional strengthening as pt reports 2 sessions left per insurance.     PT Home Exercise Plan eval: quad sets, heel slides, SAQ; hamstrings stretch, prone knee hang; STS with focus equal weight bearing; begin bike at home, standing TKE, quad stretch and STS with equal weight bearing; SLS      Patient will benefit from skilled therapeutic intervention in order to improve the following deficits and impairments:  Abnormal gait, Decreased activity tolerance, Decreased balance, Decreased endurance, Decreased range of motion, Decreased strength,  Difficulty walking, Hypomobility, Increased edema, Increased muscle spasms, Pain  Visit Diagnosis: Acute pain of right knee  Difficulty in walking, not elsewhere classified  Muscle weakness (generalized)  Localized edema  Stiffness of right knee, not elsewhere classified     Problem List Patient Active Problem List   Diagnosis Date Noted  . Primary osteoarthritis of right knee 12/04/2016  . OA (osteoarthritis) of knee 12/03/2016  . LLQ abdominal pain 03/15/2016  . Lateral meniscal tear 10/19/2015  . Left hip pain 07/22/2013  . Occipital neuralgia 06/04/2013  . Leg pain 02/22/2012  . Gout 02/22/2012  . Weight gain 01/11/2012  . Edema leg 06/07/2011  . Pruritus 04/23/2011  . Preop exam for internal medicine 04/23/2011  . Pneumonia, organism unspecified(486) 04/21/2011  . Cough 04/19/2011  . Paresthesia 09/26/2010  . NECK PAIN 08/22/2010  . BACK PAIN, THORACIC REGION 08/22/2010  . Low back pain 08/22/2010  . DIZZINESS 08/22/2010  . Cerumen impaction 07/21/2010  . CRAMP OF LIMB 03/16/2010  . BRONCHITIS, ACUTE 07/22/2009  . COPD 07/22/2009  . MEDIASTINAL LYMPHADENOPATHY 08/16/2008  . Nonspecific (abnormal) findings on radiological and other examination of body structure 08/16/2008  . CT, CHEST, ABNORMAL 08/16/2008  . CHEST PAIN 06/24/2008  . BRADYCARDIA 06/23/2008  . GERD 06/23/2008  . SYNCOPE 06/23/2008  . B12 deficiency 04/09/2008  . Pain in Soft Tissues of Limb 01/08/2008  . RASH AND OTHER NONSPECIFIC SKIN ERUPTION 01/08/2008  . SINUSITIS, ACUTE 10/31/2007  . HIP PAIN 10/31/2007  . TOBACCO USE DISORDER/SMOKER-SMOKING CESSATION DISCUSSED 04/21/2007  . Essential hypertension 04/21/2007  . MENOPAUSAL SYNDROME 04/21/2007  . ALLERGIC RHINITIS 04/19/2007  . COLONIC POLYPS, HX OF 04/19/2007   Ihor Austin, LPTA; [place]  Aldona Lento 01/18/2017, 12:34 PM  [place],  [place], [address] Phone: [phone]   Fax:  [phone]  Name: MARLEN KOMAN MRN: [id number] Date of Birth: 1956-05-09

## 2017-01-22 ENCOUNTER — Ambulatory Visit (HOSPITAL_COMMUNITY): Payer: BLUE CROSS/BLUE SHIELD

## 2017-01-22 DIAGNOSIS — M6281 Muscle weakness (generalized): Secondary | ICD-10-CM | POA: Diagnosis not present

## 2017-01-22 DIAGNOSIS — R262 Difficulty in walking, not elsewhere classified: Secondary | ICD-10-CM | POA: Diagnosis not present

## 2017-01-22 DIAGNOSIS — M25561 Pain in right knee: Secondary | ICD-10-CM | POA: Diagnosis not present

## 2017-01-22 DIAGNOSIS — M25661 Stiffness of right knee, not elsewhere classified: Secondary | ICD-10-CM | POA: Diagnosis not present

## 2017-01-22 DIAGNOSIS — R600 Localized edema: Secondary | ICD-10-CM

## 2017-01-22 NOTE — Therapy (Signed)
[place], [place], [address] Phone: [phone]   Fax:  [phone]  Physical Therapy Treatment  Patient Details  Name: Courtney Nielsen MRN: [id number] Date of Birth: Mar 27, 1956 Referring Provider: Adrian Prince, MD  Encounter Date: 01/22/2017      PT End of Session - 01/22/17 0100    Visit Number 14   Number of Visits 19   Date for PT Re-Evaluation 01/28/17   Authorization Type BCBS Other   Authorization Time Period 12/17/16 to 01/28/17   Authorization - Visit Number 14   Authorization - Number of Visits 20   PT Start Time 1300   PT Stop Time 7121   PT Time Calculation (min) 47 min   Activity Tolerance Patient tolerated treatment well;No increased pain   Behavior During Therapy WFL for tasks assessed/performed      Past Medical History:  Diagnosis Date  . Allergic rhinitis   . Arthritis   . Chest pain   . Colonic polyp   . COPD (chronic obstructive pulmonary disease) ([place])    patient denies at preop on ;   . Dysrhythmia    hx of every third beat is " off" per patient   . Hypertension   . Pneumonia    hx of 2014   . Smoker   . Vitamin B12 deficiency     Past Surgical History:  Procedure Laterality Date  . CARDIAC CATHETERIZATION     cath negative per patient   . CATARACT EXTRACTION W/PHACO Right 06/11/2016   Procedure: CATARACT EXTRACTION PHACO AND INTRAOCULAR LENS PLACEMENT RIGHT EYE CDE=6.15;  Surgeon: Tonny Branch, MD;  Location: [facility];  Service: Ophthalmology;  Laterality: Right;  right  . COLONOSCOPY    . KNEE ARTHROSCOPY Right 10/19/2015   Procedure: RIGHT KNEE SCOPE WITH MENISCAL DEBRIDEMENT,LATERAL SYNOVECTOMY;  Surgeon: Gaynelle Arabian, MD;  Location: [facility];  Service: Orthopedics;  Laterality: Right;  . TOTAL KNEE ARTHROPLASTY Right 12/03/2016   Procedure: RIGHT TOTAL KNEE ARTHROPLASTY;  Surgeon: Gaynelle Arabian, MD;  Location: [facility];  Service: Orthopedics;  Laterality: Right;    There  were no vitals filed for this visit.      Subjective Assessment - 01/22/17 1302    Subjective Pt reports unable to get the compression hose on.  reports she rode her bike prior session today.   Pertinent History Rt knee scope 2017, GERD, HTN, COPD   Patient Stated Goals ROM, strengthening   Currently in Pain? Yes   Pain Score 2    Pain Location Knee   Pain Orientation Right   Pain Type Surgical pain   Pain Onset 1 to 4 weeks ago   Pain Frequency Intermittent   Aggravating Factors  bending it, doing her HHPT exercises   Pain Relieving Factors ice, rest   Effect of Pain on Daily Activities increases                         [facility] Adult PT Treatment/Exercise - 01/22/17 0001      Ambulation/Gait   Ambulation Distance (Feet) 552 Feet   Assistive device None   Gait Pattern Step-through pattern;Decreased stance time - right;Decreased step length - left;Decreased stride length;Antalgic;Trendelenburg;Trunk flexed   Gait Comments cueing for heel to toe pattern, equal stance phase     Knee/Hip Exercises: Stretches   Active Hamstring Stretch Right;3 reps;30 seconds   Active Hamstring Stretch Limitations supine wiht sheet   Quad Stretch 3  reps;30 seconds   Quad Stretch Limitations prone with sheet   Knee: Self-Stretch to increase Flexion 10 seconds   Knee: Self-Stretch Limitations knee drives 37D 10" hold on 12in step   Gastroc Stretch 3 reps;30 seconds   Gastroc Stretch Limitations slant board     Knee/Hip Exercises: Standing   Forward Lunges 10 reps   Forward Lunges Limitations on floor   Terminal Knee Extension Limitations 10x5"   Forward Step Up Right;15 reps;Step Height: 6";Hand Hold: 1   Forward Step Up Limitations 6in step   Step Down 10 reps;Right;Hand Hold: 1;Step Height: 6"   Step Down Limitations 6in step cueing for control   Functional Squat 10 reps   Functional Squat Limitations verbal, tactile cueing for equal weight bearing   Stairs 3RT 7in step  height 1 HR     Knee/Hip Exercises: Seated   Sit to Sand 10 reps;without UE support  1 thin pillow     Knee/Hip Exercises: Supine   Knee Extension Limitations 3 degrees    Knee Flexion Limitations 108 following manual     Knee/Hip Exercises: Prone   Contract/Relax to Increase Flexion 5x  educated family member to begin at home     Manual Therapy   Manual Therapy Myofascial release;Edema management;Passive ROM   Manual therapy comments done seperate from all other aspects of treatment.   Edema Management retro massage with LE elevated    Joint Mobilization patella mobs   Myofascial Release educated family member technique to assist with scar tissue mobilization technique at home   Passive ROM contract/relax                PT Education - 01/22/17 1415    Education provided Yes   Education Details instructed granddaughter techqniues to assist with edema and ROM based therex assistance   Person(s) Educated Patient;Other (comment)  instructed granddaughter   Methods Explanation;Demonstration   Comprehension Verbalized understanding;Returned demonstration          PT Short Term Goals - 01/09/17 1701      PT SHORT TERM GOAL #1   Title Pt will consistently and independently perform HEP to maximize return to PLOF.   Baseline 01/09/17:  reports complaince daily   Status Achieved     PT SHORT TERM GOAL #2   Title Pt will have improved AROM from 5-100 in order to maximize gait and stair ambulation.    Baseline 01/09/17: 3-102 degrees (following manual for edema control)   Status Partially Met     PT SHORT TERM GOAL #3   Title Pt will have improved SLS to 30 sec or > without requiring UE support in order to demonstrate improved balance and to maximize gait.   Baseline 01/09/17:  Lt SLS 32" first attempt   Status Achieved           PT Long Term Goals - 01/09/17 1854      PT LONG TERM GOAL #1   Title Pt will have improved BLE MMT to 5/5 in order to maximize gait  and functional tasks.   Status On-going     PT LONG TERM GOAL #2   Title Pt will perform the 5xSTS in 10 sec or < with no hands and with proper mechanics in order to demonstrate improved overall BLE strength and power.   Baseline 01/09/17: 5 STS 9.82" elevated to 23in without hand, cueing to equalize weight bearing   Status On-going     PT LONG TERM GOAL #3  Title Pt will perform the TUG with LRAD in 10 sec or < with LRAD in order to demonstrate improved gait and balance in order to promote participation in community events.   Baseline 01/09/17: TUG no AD elevated seat height to 23in 8.68"   Status On-going     PT LONG TERM GOAL #4   Title Pt will have improved 3MWT to at least 656f with LRAD, proper gait mechanics, and with minimal to no pain  to demo improved BLE strength, endurance, and overall function.   Baseline 01/09/17: [id number] with verbal cueing for gait mechanics to equalize stance phase and stride length   Status Partially Met     PT LONG TERM GOAL #5   Title Pt will have improved AROM from 0-115 deg in order to maximize gait and overall function.   Status On-going               Plan - 01/22/17 1400    Clinical Impression Statement Increased focus this session with functional strengthening and continued technqiues to address knee mobilty.  Pt does demonstrate difficulty with eccentric control with descending stairs, added step down training for strengthening.  Pt does continue to display moderate edema proximal knee limiting range.  Reviewed progress of placing compression hose on LE to assist, encouraged pt to bring in hose next session for assistance.  Granddaughter present during EOS, she was educated technqiues to assist pt with edema and ROM based therex.  EOS ROM at 3-108 degrees.   Rehab Potential Good   PT Frequency 3x / week   PT Duration 6 weeks   PT Treatment/Interventions ADLs/Self Care Home Management;Cryotherapy;Electrical Stimulation;DME  Instruction;Gait training;Stair training;Functional mobility training;Therapeutic activities;Therapeutic exercise;Balance training;Neuromuscular re-education;Patient/family education;Manual techniques;Passive range of motion;Dry needling   PT Next Visit Plan Reassess next session.  Instruct pt techniques to assist with putting on compression hose.        Patient will benefit from skilled therapeutic intervention in order to improve the following deficits and impairments:  Abnormal gait, Decreased activity tolerance, Decreased balance, Decreased endurance, Decreased range of motion, Decreased strength, Difficulty walking, Hypomobility, Increased edema, Increased muscle spasms, Pain  Visit Diagnosis: Acute pain of right knee  Difficulty in walking, not elsewhere classified  Muscle weakness (generalized)  Localized edema  Stiffness of right knee, not elsewhere classified     Problem List Patient Active Problem List   Diagnosis Date Noted  . Primary osteoarthritis of right knee 12/04/2016  . OA (osteoarthritis) of knee 12/03/2016  . LLQ abdominal pain 03/15/2016  . Lateral meniscal tear 10/19/2015  . Left hip pain 07/22/2013  . Occipital neuralgia 06/04/2013  . Leg pain 02/22/2012  . Gout 02/22/2012  . Weight gain 01/11/2012  . Edema leg 06/07/2011  . Pruritus 04/23/2011  . Preop exam for internal medicine 04/23/2011  . Pneumonia, organism unspecified(486) 04/21/2011  . Cough 04/19/2011  . Paresthesia 09/26/2010  . NECK PAIN 08/22/2010  . BACK PAIN, THORACIC REGION 08/22/2010  . Low back pain 08/22/2010  . DIZZINESS 08/22/2010  . Cerumen impaction 07/21/2010  . CRAMP OF LIMB 03/16/2010  . BRONCHITIS, ACUTE 07/22/2009  . COPD 07/22/2009  . MEDIASTINAL LYMPHADENOPATHY 08/16/2008  . Nonspecific (abnormal) findings on radiological and other examination of body structure 08/16/2008  . CT, CHEST, ABNORMAL 08/16/2008  . CHEST PAIN 06/24/2008  . BRADYCARDIA 06/23/2008  . GERD  06/23/2008  . SYNCOPE 06/23/2008  . B12 deficiency 04/09/2008  . Pain in Soft Tissues of Limb 01/08/2008  . RASH AND  OTHER NONSPECIFIC SKIN ERUPTION 01/08/2008  . SINUSITIS, ACUTE 10/31/2007  . HIP PAIN 10/31/2007  . TOBACCO USE DISORDER/SMOKER-SMOKING CESSATION DISCUSSED 04/21/2007  . Essential hypertension 04/21/2007  . MENOPAUSAL SYNDROME 04/21/2007  . ALLERGIC RHINITIS 04/19/2007  . COLONIC POLYPS, HX OF 04/19/2007   Ihor Austin, LPTA; [place]  Aldona Lento 01/22/2017, 2:18 PM  [place], [place], [address] Phone: [phone]   Fax:  [phone]  Name: HAVANNA GRONER MRN: [id number] Date of Birth: 1956-05-10

## 2017-01-24 ENCOUNTER — Encounter (HOSPITAL_COMMUNITY): Payer: Self-pay

## 2017-01-24 ENCOUNTER — Ambulatory Visit (HOSPITAL_COMMUNITY): Payer: BLUE CROSS/BLUE SHIELD

## 2017-01-24 DIAGNOSIS — M25661 Stiffness of right knee, not elsewhere classified: Secondary | ICD-10-CM

## 2017-01-24 DIAGNOSIS — R600 Localized edema: Secondary | ICD-10-CM

## 2017-01-24 DIAGNOSIS — R262 Difficulty in walking, not elsewhere classified: Secondary | ICD-10-CM | POA: Diagnosis not present

## 2017-01-24 DIAGNOSIS — M25561 Pain in right knee: Secondary | ICD-10-CM

## 2017-01-24 DIAGNOSIS — M6281 Muscle weakness (generalized): Secondary | ICD-10-CM | POA: Diagnosis not present

## 2017-01-24 NOTE — Therapy (Signed)
[place], [place], [address] Phone: [phone]   Fax:  [phone]  Physical Therapy Treatment/Discharge Summary  Patient Details  Name: Courtney Nielsen MRN: [id number] Date of Birth: [birth year] Referring Provider: Adrian Prince, MD  Encounter Date: 01/24/2017      PT End of Session - 01/24/17 1304    Visit Number 15   Number of Visits 19   Date for PT Re-Evaluation 01/28/17   Authorization Type BCBS Other   Authorization Time Period 12/17/16 to 01/28/17   Authorization - Visit Number 15   Authorization - Number of Visits 20   PT Start Time 1301   PT Stop Time 1336   PT Time Calculation (min) 35 min   Activity Tolerance Patient tolerated treatment well;No increased pain   Behavior During Therapy WFL for tasks assessed/performed      Past Medical History:  Diagnosis Date  . Allergic rhinitis   . Arthritis   . Chest pain   . Colonic polyp   . COPD (chronic obstructive pulmonary disease) ([place])    patient denies at preop on ;   . Dysrhythmia    hx of every third beat is " off" per patient   . Hypertension   . Pneumonia    hx of 2014   . Smoker   . Vitamin B12 deficiency     Past Surgical History:  Procedure Laterality Date  . CARDIAC CATHETERIZATION     cath negative per patient   . CATARACT EXTRACTION W/PHACO Right 06/11/2016   Procedure: CATARACT EXTRACTION PHACO AND INTRAOCULAR LENS PLACEMENT RIGHT EYE CDE=6.15;  Surgeon: Tonny Branch, MD;  Location: [facility];  Service: Ophthalmology;  Laterality: Right;  right  . COLONOSCOPY    . KNEE ARTHROSCOPY Right 10/19/2015   Procedure: RIGHT KNEE SCOPE WITH MENISCAL DEBRIDEMENT,LATERAL SYNOVECTOMY;  Surgeon: Gaynelle Arabian, MD;  Location: [facility];  Service: Orthopedics;  Laterality: Right;  . TOTAL KNEE ARTHROPLASTY Right 12/03/2016   Procedure: RIGHT TOTAL KNEE ARTHROPLASTY;  Surgeon: Gaynelle Arabian, MD;  Location: [facility];  Service: Orthopedics;  Laterality:  Right;    There were no vitals filed for this visit.      Subjective Assessment - 01/24/17 1302    Subjective Pt states that she still has that same stiffness at her anterior knee. She states she goes back to work full-time from home next Wednesday. No pain, just stiff.   Pertinent History Rt knee scope 2017, GERD, HTN, COPD   Patient Stated Goals ROM, strengthening   Currently in Pain? No/denies   Pain Onset 1 to 4 weeks ago            [facility] PT Assessment - 01/24/17 0001      AROM   Right Knee Extension 5   Right Knee Flexion 107     Strength   Right Hip Flexion 5/5  was 5   Right Hip Extension 5/5  was 4+   Right Hip ABduction 4+/5  was 4+   Left Hip Flexion 5/5  was 4+   Left Hip Extension 5/5  was 4+   Left Hip ABduction 4+/5  was 4+   Right Knee Extension 5/5  was 4-   Right Ankle Dorsiflexion 5/5  was 4+     Standardized Balance Assessment   Standardized Balance Assessment Timed Up and Go Test;Five Times Sit to Stand   Five times sit to stand comments  10 sec from chair with no UE support  Timed Up and Go Test   TUG Normal TUG              [facility] Adult PT Treatment/Exercise - 01/24/17 0001      Knee/Hip Exercises: Aerobic   Stationary Bike x5 min at beginning of session for w/u (unbilled)               PT Short Term Goals - 01/24/17 1308      PT SHORT TERM GOAL #1   Title Pt will consistently and independently perform HEP to maximize return to PLOF.   Baseline 01/09/17:  reports complaince daily   Status Achieved     PT SHORT TERM GOAL #2   Title Pt will have improved AROM from 5-100 in order to maximize gait and stair ambulation.    Baseline 7/26: 5-107    Status Achieved     PT SHORT TERM GOAL #3   Title Pt will have improved SLS to 30 sec or > without requiring UE support in order to demonstrate improved balance and to maximize gait.   Baseline 01/09/17:  Lt SLS 32" first attempt   Status Achieved           PT Long  Term Goals - 01/24/17 1308      PT LONG TERM GOAL #1   Title Pt will have improved BLE MMT to 5/5 in order to maximize gait and functional tasks.   Baseline 7/26: L and R hip abd 4+/5   Status Partially Met     PT LONG TERM GOAL #2   Title Pt will perform the 5xSTS in 10 sec or < with no hands and with proper mechanics in order to demonstrate improved overall BLE strength and power.   Baseline 7/26: 10 sec from chair no UE   Status Achieved     PT LONG TERM GOAL #3   Title Pt will perform the TUG with LRAD in 10 sec or < with LRAD in order to demonstrate improved gait and balance in order to promote participation in community events.   Baseline 7/26: 7.6 no AD   Status Achieved     PT LONG TERM GOAL #4   Title Pt will have improved 3MWT to at least 671f with LRAD, proper gait mechanics, and with minimal to no pain  to demo improved BLE strength, endurance, and overall function.   Baseline 7/26: 7324fwith gait mechanics WFL, no cueing for technique   Status Achieved     PT LONG TERM GOAL #5   Title Pt will have improved AROM from 0-115 deg in order to maximize gait and overall function.   Baseline 7/26: 5-107   Status On-going               Plan - 01/24/17 1343    Clinical Impression Statement PT reassessed pts goals and outcome measures this date. Pt verbalizes she feels at least 80% improved since starting therapy, stating that the remaining 20% is the continued stiffness. She has made significant improvements since starting therapy as she has met 6/8 goals, with 1 being partially met and her long-term ROM goal is still on-going. Pt able to achieve 5-107 this date without the need for manual therapy, an improvement from the last time her goals were checked. PT feels this could be due to the edema that is still present and educated pt that once she get her compression garment on and wears it consistently, this will improve. PT demonstrated how to use  a sock aid to assist in  donning her compression garment and she verbalized understanding. At this time, due to improvements in function as well as insurance limitations (per pt), pt will be discharged to her HEP. Pt was encouraged to begin a walking program and was also educated that she could return with a referral if she had a decline in function. She verablized understanding and was agreeable to discharge.   Rehab Potential Good   PT Frequency 3x / week   PT Duration 6 weeks   PT Treatment/Interventions ADLs/Self Care Home Management;Cryotherapy;Electrical Stimulation;DME Instruction;Gait training;Stair training;Functional mobility training;Therapeutic activities;Therapeutic exercise;Balance training;Neuromuscular re-education;Patient/family education;Manual techniques;Passive range of motion;Dry needling   PT Next Visit Plan Reassess next session.  Instruct pt techniques to assist with putting on compression hose.     PT Home Exercise Plan eval: quad sets, heel slides, SAQ; hamstrings stretch, prone knee hang; STS with focus equal weight bearing; begin bike at home, standing TKE, quad stretch and STS with equal weight bearing; SLS; 7/26: sidelying hip abd, walking program   Consulted and Agree with Plan of Care Patient      Patient will benefit from skilled therapeutic intervention in order to improve the following deficits and impairments:  Abnormal gait, Decreased activity tolerance, Decreased balance, Decreased endurance, Decreased range of motion, Decreased strength, Difficulty walking, Hypomobility, Increased edema, Increased muscle spasms, Pain  Visit Diagnosis: Acute pain of right knee  Difficulty in walking, not elsewhere classified  Muscle weakness (generalized)  Localized edema  Stiffness of right knee, not elsewhere classified     Problem List Patient Active Problem List   Diagnosis Date Noted  . Primary osteoarthritis of right knee 12/04/2016  . OA (osteoarthritis) of knee 12/03/2016  . LLQ  abdominal pain 03/15/2016  . Lateral meniscal tear 10/19/2015  . Left hip pain 07/22/2013  . Occipital neuralgia 06/04/2013  . Leg pain 02/22/2012  . Gout 02/22/2012  . Weight gain 01/11/2012  . Edema leg 06/07/2011  . Pruritus 04/23/2011  . Preop exam for internal medicine 04/23/2011  . Pneumonia, organism unspecified(486) 04/21/2011  . Cough 04/19/2011  . Paresthesia 09/26/2010  . NECK PAIN 08/22/2010  . BACK PAIN, THORACIC REGION 08/22/2010  . Low back pain 08/22/2010  . DIZZINESS 08/22/2010  . Cerumen impaction 07/21/2010  . CRAMP OF LIMB 03/16/2010  . BRONCHITIS, ACUTE 07/22/2009  . COPD 07/22/2009  . MEDIASTINAL LYMPHADENOPATHY 08/16/2008  . Nonspecific (abnormal) findings on radiological and other examination of body structure 08/16/2008  . CT, CHEST, ABNORMAL 08/16/2008  . CHEST PAIN 06/24/2008  . BRADYCARDIA 06/23/2008  . GERD 06/23/2008  . SYNCOPE 06/23/2008  . B12 deficiency 04/09/2008  . Pain in Soft Tissues of Limb 01/08/2008  . RASH AND OTHER NONSPECIFIC SKIN ERUPTION 01/08/2008  . SINUSITIS, ACUTE 10/31/2007  . HIP PAIN 10/31/2007  . TOBACCO USE DISORDER/SMOKER-SMOKING CESSATION DISCUSSED 04/21/2007  . Essential hypertension 04/21/2007  . MENOPAUSAL SYNDROME 04/21/2007  . ALLERGIC RHINITIS 04/19/2007  . COLONIC POLYPS, HX OF 04/19/2007     Geraldine Solar PT, DPT  [facility] [address] [place], [place], [address] Phone: [phone]   Fax:  [phone]  Name: Courtney Nielsen MRN: [id number] Date of Birth: 21-Feb-1956

## 2017-01-24 NOTE — Patient Instructions (Addendum)
  HIP ABDUCTION - SIDELYING  While lying on your side, slowly raise up your top leg to the side. Keep your knee straight and maintain your toes pointed forward the entire time. Keep your leg in-line with your body.  The bottom leg can be bent to stabilize your body.  Perform 2x/day, 2-3 sets of 10 reps on each side   WALKING  Start a walking program. Start out at 1-2 laps around the track and steadily increase as you feel you can handle.

## 2017-02-15 ENCOUNTER — Other Ambulatory Visit: Payer: Self-pay | Admitting: Internal Medicine

## 2017-02-19 NOTE — Telephone Encounter (Signed)
Check  registry for last filled date no history on file. pls advise on refill...Courtney Nielsen

## 2017-02-20 NOTE — Telephone Encounter (Signed)
Called refill into pharmacy spoke w/Scott gave MD approval..../LMB

## 2017-03-06 ENCOUNTER — Telehealth: Payer: Self-pay | Admitting: Internal Medicine

## 2017-03-06 DIAGNOSIS — Z0000 Encounter for general adult medical examination without abnormal findings: Secondary | ICD-10-CM

## 2017-03-06 NOTE — Telephone Encounter (Signed)
Pt has an appointment for 9/14 for her CPE and would like her orders for blood work put in so she can go to the lab before her appointment.  Please advise

## 2017-03-08 NOTE — Telephone Encounter (Signed)
Please advise on labs.

## 2017-03-09 NOTE — Telephone Encounter (Signed)
CBC, BMET, LFT, UA, TSH, Lipids Thx

## 2017-03-11 NOTE — Telephone Encounter (Signed)
Labs entered.

## 2017-03-15 ENCOUNTER — Encounter: Payer: BLUE CROSS/BLUE SHIELD | Admitting: Internal Medicine

## 2017-03-15 IMAGING — CT CT RENAL STONE PROTOCOL
2 of 4 series · 9 of 46 positions shown, 10 images · non-contrast
Comparison: None.

CLINICAL DATA: Right flank pain.

EXAM:
CT ABDOMEN AND PELVIS WITHOUT CONTRAST
TECHNIQUE: Multidetector CT imaging of the abdomen and pelvis was performed
following the standard protocol without IV contrast.

[Series 201: stone study, idose (2) · axial · 0.92mm/px · z∈[+68,+458]mm · 6 of 96 slices shown, 7 images]
[im 9/96  soft-tissue]
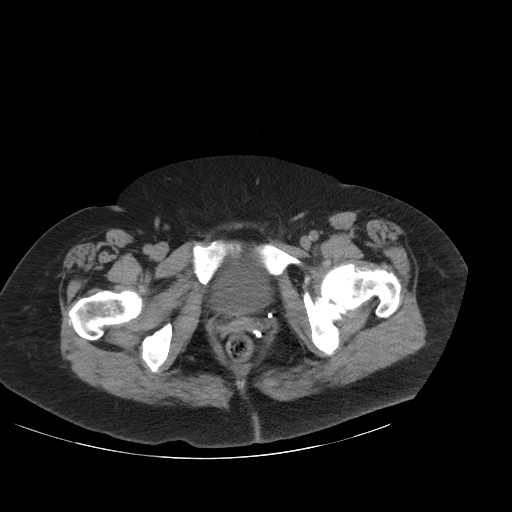
[im 9/96  bone]
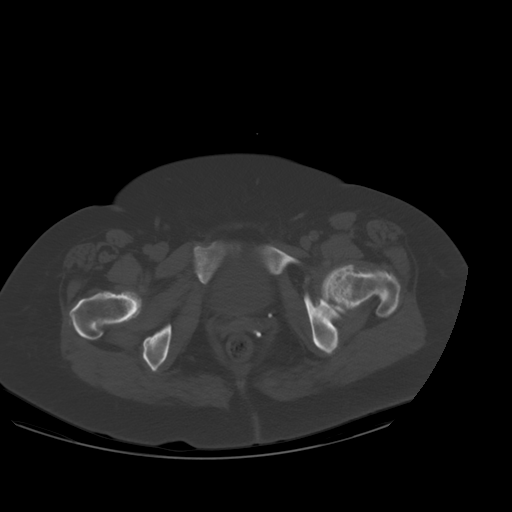
[im 25/96  soft-tissue]
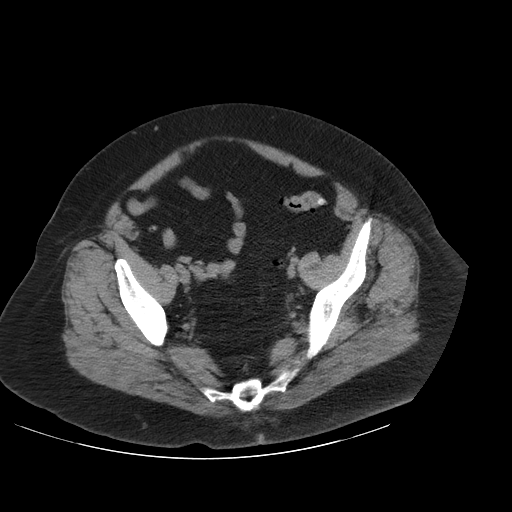
[im 42/96  soft-tissue]
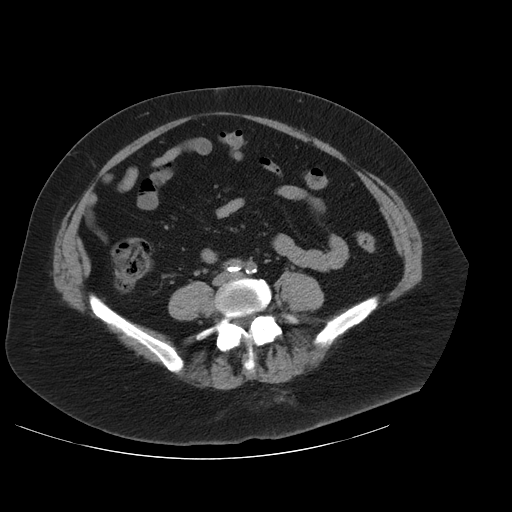
[im 54/96  soft-tissue]
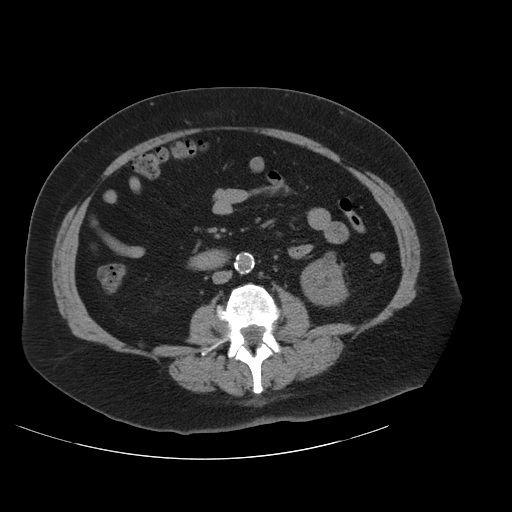
[im 71/96  soft-tissue]
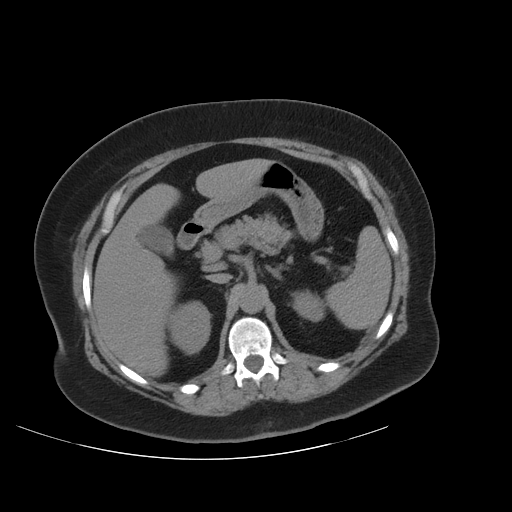
[im 87/96  soft-tissue]
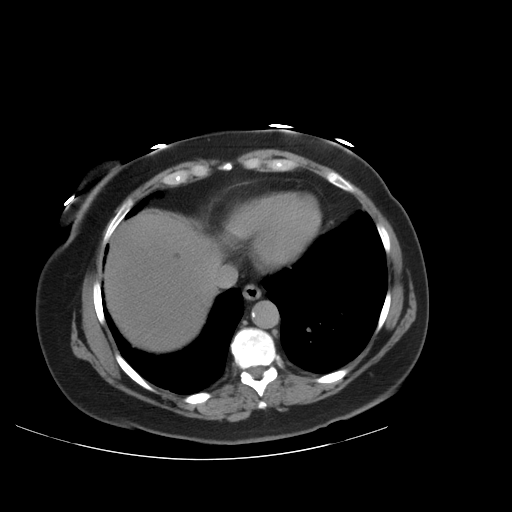

[Series 203: coronals, idose (2) · coronal · 0.45mm/px · 3 of 139 slices shown]
[im 47/139  soft-tissue]
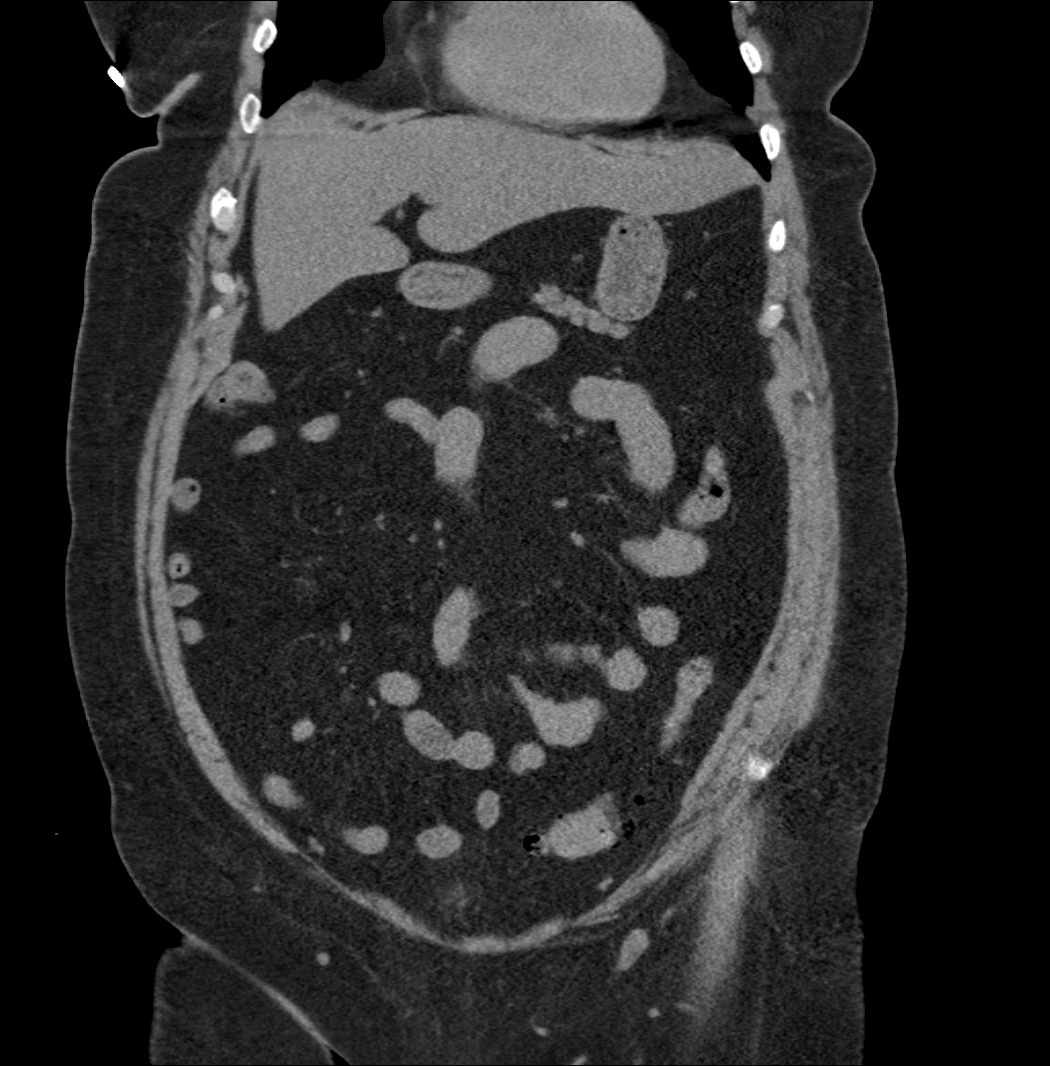
[im 62/139  soft-tissue]
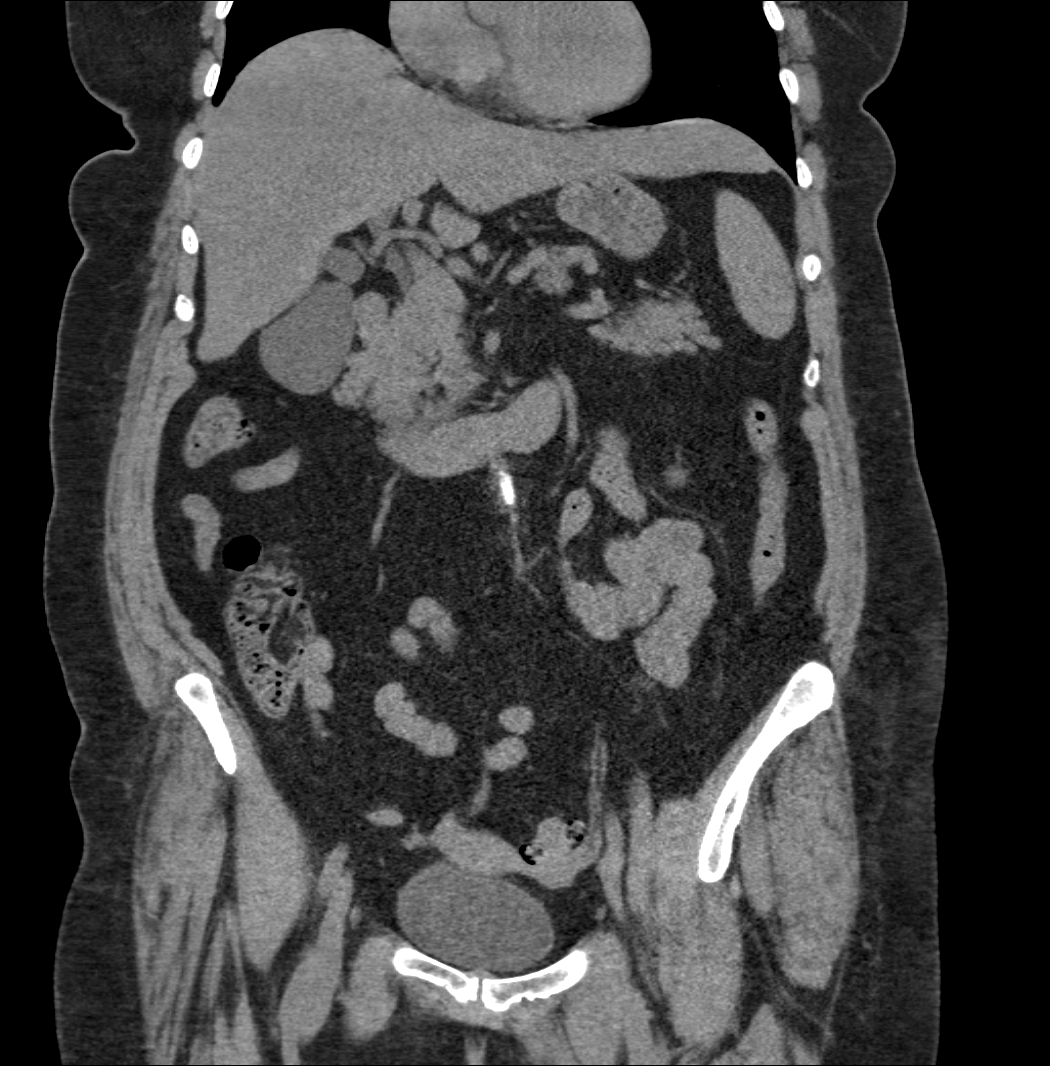
[im 77/139  soft-tissue]
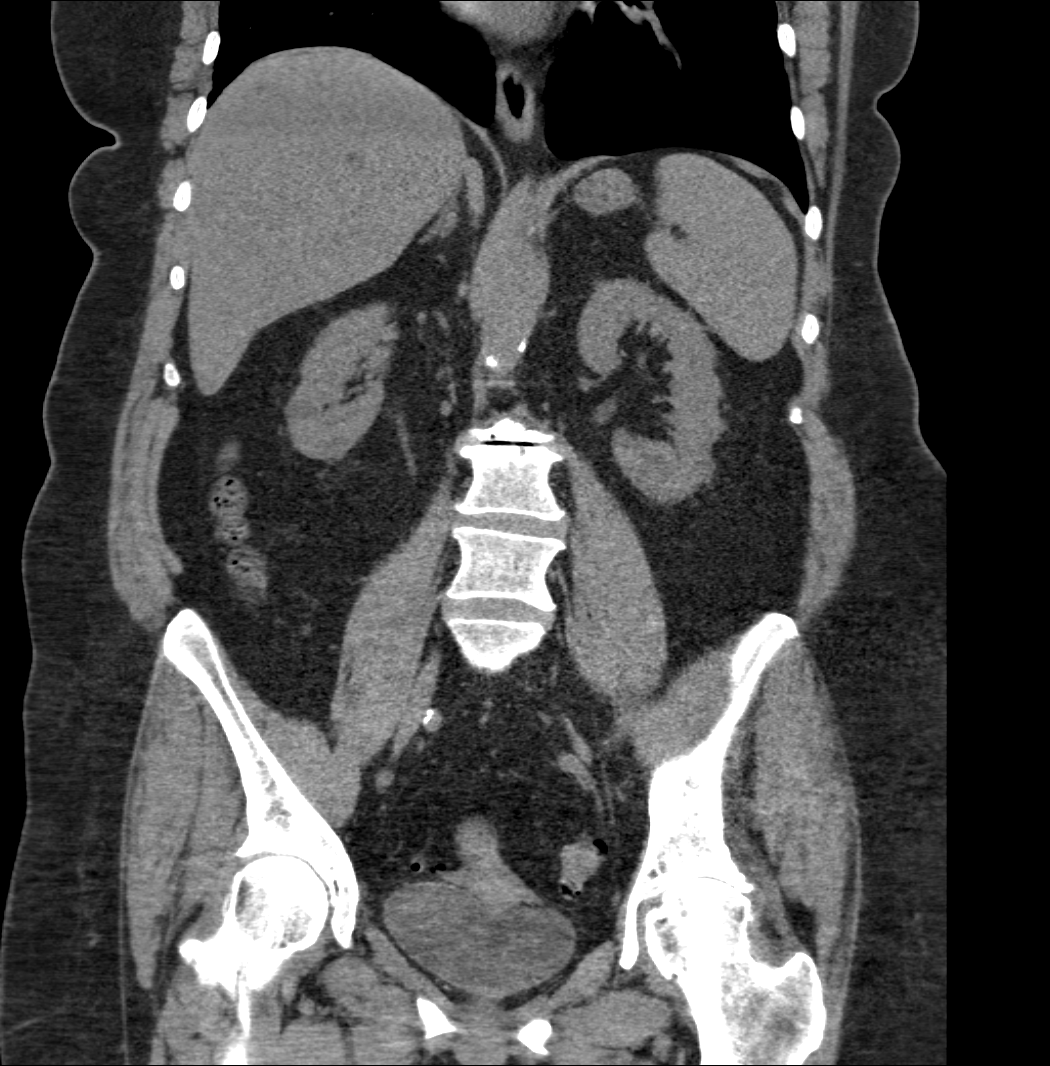

[9 of 46 positions shown; findings below may reference images not displayed]

FINDINGS: Lower chest: Lung bases are clear. No effusions. Heart is normal
size.

Hepatobiliary: Scattered hypodensities throughout the liver, likely
small cysts. Small layering gallstone within the gallbladder. No
biliary duct dilatation.

Pancreas: No focal abnormality or ductal dilatation.

Spleen: No focal abnormality.  Normal size.

Adrenals/Urinary Tract: No adrenal abnormality. No focal renal
abnormality. No stones or hydronephrosis. Urinary bladder is
unremarkable.

Stomach/Bowel: Appendix is normal. Sigmoid diverticulosis. No active
diverticulitis. Stomach and small bowel unremarkable.

Vascular/Lymphatic: Aortic and iliac calcifications diffusely. No
aneurysm. No adenopathy.

Reproductive: Uterus and adnexa unremarkable.  No mass.

Other: No free fluid or free air.

Musculoskeletal: No acute bony abnormality. Degenerative changes in
the mid lumbar spine.
IMPRESSION: No renal or ureteral stones.  No hydronephrosis.

Small layering gallstone.

Sigmoid diverticulosis.

No acute findings.

Aortoiliac atherosclerosis.

## 2017-03-19 ENCOUNTER — Encounter: Payer: BLUE CROSS/BLUE SHIELD | Admitting: Internal Medicine

## 2017-03-20 ENCOUNTER — Encounter: Payer: Self-pay | Admitting: Gastroenterology

## 2017-03-28 ENCOUNTER — Other Ambulatory Visit (INDEPENDENT_AMBULATORY_CARE_PROVIDER_SITE_OTHER): Payer: BLUE CROSS/BLUE SHIELD

## 2017-03-28 DIAGNOSIS — Z0000 Encounter for general adult medical examination without abnormal findings: Secondary | ICD-10-CM

## 2017-03-28 LAB — HEPATIC FUNCTION PANEL
ALT: 13 U/L (ref 0–35)
AST: 12 U/L (ref 0–37)
Albumin: 3.9 g/dL (ref 3.5–5.2)
Alkaline Phosphatase: 75 U/L (ref 39–117)
BILIRUBIN DIRECT: 0.1 mg/dL (ref 0.0–0.3)
Total Bilirubin: 0.8 mg/dL (ref 0.2–1.2)
Total Protein: 6.5 g/dL (ref 6.0–8.3)

## 2017-03-28 LAB — CBC WITH DIFFERENTIAL/PLATELET
BASOS PCT: 1.1 % (ref 0.0–3.0)
Basophils Absolute: 0.1 10*3/uL (ref 0.0–0.1)
EOS ABS: 0.5 10*3/uL (ref 0.0–0.7)
Eosinophils Relative: 6.9 % — ABNORMAL HIGH (ref 0.0–5.0)
HCT: 42 % (ref 36.0–46.0)
Hemoglobin: 13.9 g/dL (ref 12.0–15.0)
Lymphocytes Relative: 35.5 % (ref 12.0–46.0)
Lymphs Abs: 2.7 10*3/uL (ref 0.7–4.0)
MCHC: 33 g/dL (ref 30.0–36.0)
MCV: 92.4 fl (ref 78.0–100.0)
MONO ABS: 0.5 10*3/uL (ref 0.1–1.0)
Monocytes Relative: 6.3 % (ref 3.0–12.0)
NEUTROS ABS: 3.9 10*3/uL (ref 1.4–7.7)
Neutrophils Relative %: 50.2 % (ref 43.0–77.0)
Platelets: 219 10*3/uL (ref 150.0–400.0)
RBC: 4.55 Mil/uL (ref 3.87–5.11)
RDW: 14.2 % (ref 11.5–15.5)
WBC: 7.7 10*3/uL (ref 4.0–10.5)

## 2017-03-28 LAB — URINALYSIS
Bilirubin Urine: NEGATIVE
Hgb urine dipstick: NEGATIVE
Ketones, ur: NEGATIVE
Leukocytes, UA: NEGATIVE
NITRITE: NEGATIVE
PH: 6 (ref 5.0–8.0)
SPECIFIC GRAVITY, URINE: 1.02 (ref 1.000–1.030)
Total Protein, Urine: NEGATIVE
Urine Glucose: NEGATIVE
Urobilinogen, UA: 0.2 (ref 0.0–1.0)

## 2017-03-28 LAB — LIPID PANEL
CHOL/HDL RATIO: 5
CHOLESTEROL: 192 mg/dL (ref 0–200)
HDL: 41.6 mg/dL (ref >39.00)
LDL CALC: 129 mg/dL — AB (ref 0–99)
NonHDL: 150.26
TRIGLYCERIDES: 106 mg/dL (ref 0.0–149.0)
VLDL: 21.2 mg/dL (ref 0.0–40.0)

## 2017-03-28 LAB — BASIC METABOLIC PANEL
BUN: 15 mg/dL (ref 6–23)
CO2: 26 mEq/L (ref 19–32)
Calcium: 9.4 mg/dL (ref 8.4–10.5)
Chloride: 108 mEq/L (ref 96–112)
Creatinine, Ser: 0.91 mg/dL (ref 0.40–1.20)
GFR: 66.79 mL/min (ref >60.00)
Glucose, Bld: 91 mg/dL (ref 70–99)
POTASSIUM: 3.9 meq/L (ref 3.5–5.1)
SODIUM: 141 meq/L (ref 135–145)

## 2017-03-28 LAB — TSH: TSH: 0.74 u[IU]/mL (ref 0.35–4.50)

## 2017-04-03 ENCOUNTER — Encounter: Payer: Self-pay | Admitting: Internal Medicine

## 2017-04-03 ENCOUNTER — Ambulatory Visit (INDEPENDENT_AMBULATORY_CARE_PROVIDER_SITE_OTHER): Payer: BLUE CROSS/BLUE SHIELD | Admitting: Internal Medicine

## 2017-04-03 DIAGNOSIS — Z0001 Encounter for general adult medical examination with abnormal findings: Secondary | ICD-10-CM

## 2017-04-03 DIAGNOSIS — H6123 Impacted cerumen, bilateral: Secondary | ICD-10-CM

## 2017-04-03 DIAGNOSIS — M545 Low back pain: Secondary | ICD-10-CM

## 2017-04-03 DIAGNOSIS — M1000 Idiopathic gout, unspecified site: Secondary | ICD-10-CM

## 2017-04-03 DIAGNOSIS — I10 Essential (primary) hypertension: Secondary | ICD-10-CM

## 2017-04-03 DIAGNOSIS — E538 Deficiency of other specified B group vitamins: Secondary | ICD-10-CM | POA: Diagnosis not present

## 2017-04-03 DIAGNOSIS — Z0000 Encounter for general adult medical examination without abnormal findings: Secondary | ICD-10-CM

## 2017-04-03 DIAGNOSIS — M79604 Pain in right leg: Secondary | ICD-10-CM

## 2017-04-03 DIAGNOSIS — K219 Gastro-esophageal reflux disease without esophagitis: Secondary | ICD-10-CM

## 2017-04-03 MED ORDER — ALPRAZOLAM 0.5 MG PO TABS: ORAL_TABLET | ORAL | 3 refills | Status: DC

## 2017-04-03 MED ORDER — LOSARTAN POTASSIUM 100 MG PO TABS: 100.0000 mg | ORAL_TABLET | Freq: Every evening | ORAL | 3 refills | Status: DC

## 2017-04-03 NOTE — Progress Notes (Signed)
Subjective:  Patient ID: Courtney Nielsen, female    DOB: 06/07/1956  Age: 61 y.o. MRN: [id number]  CC: No chief complaint on file.   HPI Courtney Nielsen presents for a well exam S/p R TKR 6/18  Outpatient Medications Prior to Visit  Medication Sig Dispense Refill  . acetaminophen (TYLENOL) 500 MG tablet Take 1,000 mg by mouth every 6 (six) hours as needed for moderate pain.    Marland Kitchen ALPRAZolam (XANAX) 0.5 MG tablet TAKE ONE TABLET BY MOUTH AT BEDTIME AS NEEDED FOR ANXIETY. 30 tablet 3  . loratadine (CLARITIN) 10 MG tablet Take 10 mg by mouth at bedtime.    Marland Kitchen losartan (COZAAR) 100 MG tablet Take 1 tablet (100 mg total) by mouth every evening. (Patient taking differently: Take 100 mg by mouth at bedtime. ) 90 tablet 3  . Menthol, Topical Analgesic, (BLUE-EMU MAXIMUM STRENGTH EX) Apply 1 application topically daily.     . methocarbamol (ROBAXIN) 500 MG tablet Take 1 tablet (500 mg total) by mouth every 6 (six) hours as needed for muscle spasms. 80 tablet 0  . oxyCODONE (OXY IR/ROXICODONE) 5 MG immediate release tablet Take 1-2 tablets (5-10 mg total) by mouth every 4 (four) hours as needed for moderate pain or severe pain. 84 tablet 0  . rivaroxaban (XARELTO) 10 MG TABS tablet Take 1 tablet (10 mg total) by mouth daily with breakfast. Take Xarelto for two and a half more weeks following discharge from the hospital, then discontinue Xarelto. Once the patient has completed the Xarelto, they may resume the 81 mg Aspirin. 20 tablet 0  . traMADol (ULTRAM) 50 MG tablet Take 1-2 tablets (50-100 mg total) by mouth every 6 (six) hours as needed for moderate pain. 56 tablet 0   No facility-administered medications prior to visit.     ROS Review of Systems  Constitutional: Negative for activity change, appetite change, chills, fatigue and unexpected weight change.  HENT: Negative for congestion, mouth sores and sinus pressure.   Eyes: Negative for visual disturbance.  Respiratory: Negative for  cough and chest tightness.   Gastrointestinal: Negative for abdominal pain and nausea.  Genitourinary: Negative for difficulty urinating, frequency and vaginal pain.  Musculoskeletal: Positive for arthralgias, back pain and gait problem.  Skin: Negative for pallor and rash.  Neurological: Negative for dizziness, tremors, weakness, numbness and headaches.  Psychiatric/Behavioral: Negative for confusion and sleep disturbance.    Objective:  BP 138/80 (BP Location: Left Arm, Patient Position: Sitting, Cuff Size: Large)   Pulse (!) 53   Temp 98.3 F (36.8 C) (Oral)   Ht 5\' 10"  (1.778 m)   Wt 225 lb (102.1 kg)   SpO2 98%   BMI 32.28 kg/m   BP Readings from Last 3 Encounters:  04/03/17 138/80  12/04/16 (!) 153/81  11/22/16 (!) 150/78    Wt Readings from Last 3 Encounters:  04/03/17 225 lb (102.1 kg)  12/03/16 230 lb (104.3 kg)  11/22/16 230 lb (104.3 kg)    Physical Exam  Constitutional: She appears well-developed. No distress.  HENT:  Head: Normocephalic.  Right Ear: External ear normal.  Left Ear: External ear normal.  Nose: Nose normal.  Mouth/Throat: Oropharynx is clear and moist.  Eyes: Pupils are equal, round, and reactive to light. Conjunctivae are normal. Right eye exhibits no discharge. Left eye exhibits no discharge.  Neck: Normal range of motion. Neck supple. No JVD present. No tracheal deviation present. No thyromegaly present.  Cardiovascular: Normal rate, regular rhythm and normal heart  sounds.   Pulmonary/Chest: No stridor. No respiratory distress. She has no wheezes.  Abdominal: Soft. Bowel sounds are normal. She exhibits no distension and no mass. There is no tenderness. There is no rebound and no guarding.  Musculoskeletal: She exhibits tenderness. She exhibits no edema.  Lymphadenopathy:    She has no cervical adenopathy.  Neurological: She displays normal reflexes. No cranial nerve deficit. She exhibits normal muscle tone. Coordination normal.  Skin: No  rash noted. No erythema.  Psychiatric: She has a normal mood and affect. Her behavior is normal. Judgment and thought content normal.  R knee w/a scar LS tender Cane B ear wax    Procedure Note :     Procedure :  Ear irrigation   Indication:  Cerumen impaction B   Risks, including pain, dizziness, eardrum perforation, bleeding, infection and others as well as benefits were explained to the patient in detail. Verbal consent was obtained and the patient agreed to proceed.    We used "The Elephant Ear Irrigation Device" filled with lukewarm water for irrigation. A large amount wax was recovered. Procedure has also required manual wax removal with an ear loop.   Tolerated well. Complications: None.   Postprocedure instructions :  Call if problems.   Lab Results  Component Value Date   WBC 7.7 03/28/2017   HGB 13.9 03/28/2017   HCT 42.0 03/28/2017   PLT 219.0 03/28/2017   GLUCOSE 91 03/28/2017   CHOL 192 03/28/2017   TRIG 106.0 03/28/2017   HDL 41.60 03/28/2017   LDLDIRECT 164.6 01/05/2008   LDLCALC 129 (H) 03/28/2017   ALT 13 03/28/2017   AST 12 03/28/2017   NA 141 03/28/2017   K 3.9 03/28/2017   CL 108 03/28/2017   CREATININE 0.91 03/28/2017   BUN 15 03/28/2017   CO2 26 03/28/2017   TSH 0.74 03/28/2017   INR 0.96 11/22/2016   HGBA1C 5.6 03/02/2014    Dg Chest 2 View  Result Date: 11/22/2016 CLINICAL DATA:  Pre right knee replacement evaluation. Smoker. COPD. EXAM: CHEST  2 VIEW COMPARISON:  04/19/2011. FINDINGS: Normal sized heart. Clear lungs. The lungs are mildly hyperexpanded with mild diffuse peribronchial thickening and accentuation of the interstitial markings. Upper lumbar spine degenerative changes. IMPRESSION: Mild changes of COPD and chronic bronchitis.  No acute abnormality. Electronically Signed   By: Claudie Revering M.D.   On: 11/22/2016 20:09    Assessment & Plan:   There are no diagnoses linked to this encounter. I have discontinued Ms. Welsch's  oxyCODONE, rivaroxaban, and traMADol. I am also having her maintain her loratadine, acetaminophen, (Menthol, Topical Analgesic, (BLUE-EMU MAXIMUM STRENGTH EX)), losartan, methocarbamol, ALPRAZolam, medroxyPROGESTERone, estradiol, aspirin EC, and celecoxib.  Meds ordered this encounter  Medications  . medroxyPROGESTERone (PROVERA) 2.5 MG tablet    Sig: medroxyprogesterone 2.5 mg tablet  TAKE 1 T BY MOUTH DAILY  . estradiol (ESTRACE) 0.5 MG tablet    Sig: estradiol 0.5 mg tablet  TAKE 1 TABLET BY MOUTH DAILY  . aspirin EC 81 MG tablet    Sig: Take 81 mg by mouth daily.  . celecoxib (CELEBREX) 200 MG capsule    Sig: Take 200 mg by mouth 2 (two) times daily.     Follow-up: No Follow-up on file.  Walker Kehr, MD

## 2017-04-03 NOTE — Assessment & Plan Note (Signed)
S/p R TKR 6/18

## 2017-04-03 NOTE — Assessment & Plan Note (Signed)
No relapse 

## 2017-04-03 NOTE — Assessment & Plan Note (Signed)
We discussed age appropriate health related issues, including available/recomended screening tests and vaccinations. We discussed a need for adhering to healthy diet and exercise. Labs were ordered to be later reviewed . All questions were answered.  Flu shot at work

## 2017-04-03 NOTE — Assessment & Plan Note (Signed)
On Vit B12 

## 2017-04-03 NOTE — Assessment & Plan Note (Signed)
Losartan 

## 2017-04-03 NOTE — Assessment & Plan Note (Signed)
See procedure 

## 2017-04-03 NOTE — Assessment & Plan Note (Signed)
Cane

## 2017-04-03 NOTE — Assessment & Plan Note (Signed)
Off Rx 

## 2017-04-18 DIAGNOSIS — Z01419 Encounter for gynecological examination (general) (routine) without abnormal findings: Secondary | ICD-10-CM | POA: Diagnosis not present

## 2017-04-18 DIAGNOSIS — Z124 Encounter for screening for malignant neoplasm of cervix: Secondary | ICD-10-CM | POA: Diagnosis not present

## 2017-04-18 DIAGNOSIS — Z1231 Encounter for screening mammogram for malignant neoplasm of breast: Secondary | ICD-10-CM | POA: Diagnosis not present

## 2017-04-18 DIAGNOSIS — Z6832 Body mass index (BMI) 32.0-32.9, adult: Secondary | ICD-10-CM | POA: Diagnosis not present

## 2017-04-30 ENCOUNTER — Ambulatory Visit (AMBULATORY_SURGERY_CENTER): Payer: Self-pay | Admitting: *Deleted

## 2017-04-30 VITALS — Ht 70.0 in | Wt 228.0 lb

## 2017-04-30 DIAGNOSIS — Z86010 Personal history of colonic polyps: Secondary | ICD-10-CM

## 2017-04-30 MED ORDER — NA SULFATE-K SULFATE-MG SULF 17.5-3.13-1.6 GM/177ML PO SOLN: 1.0000 | Freq: Once | ORAL | 0 refills | Status: AC

## 2017-04-30 NOTE — Progress Notes (Signed)
No egg or soy allergy known to patient  No issues with past sedation with any surgeries  or procedures, no intubation problems  No diet pills per patient No home 02 use per patient  No blood thinners per patient  Pt denies issues with constipation  No A fib or A flutter  EMMI video sent to pt's e mail - pt declined  Pay no more than $50 coupon to pt today for suprep

## 2017-05-01 ENCOUNTER — Encounter: Payer: Self-pay | Admitting: Gastroenterology

## 2017-05-14 ENCOUNTER — Encounter: Payer: Self-pay | Admitting: Gastroenterology

## 2017-05-14 ENCOUNTER — Ambulatory Visit (AMBULATORY_SURGERY_CENTER): Payer: BLUE CROSS/BLUE SHIELD | Admitting: Gastroenterology

## 2017-05-14 VITALS — BP 140/59 | HR 68 | Temp 97.8°F | Resp 12 | Ht 70.0 in | Wt 228.0 lb

## 2017-05-14 DIAGNOSIS — Z86010 Personal history of colonic polyps: Secondary | ICD-10-CM

## 2017-05-14 DIAGNOSIS — D123 Benign neoplasm of transverse colon: Secondary | ICD-10-CM

## 2017-05-14 MED ORDER — SODIUM CHLORIDE 0.9 % IV SOLN: 500.0000 mL | INTRAVENOUS | Status: DC

## 2017-05-14 NOTE — Op Note (Signed)
[facility] Patient Name: Courtney Nielsen Procedure Date: 05/14/2017 8:37 AM MRN: [id number] Endoscopist: Ladene Artist , MD Age: 61 Referring MD:  Date of Birth: [birth year] Gender: Female Account #: [id number] Procedure:                Colonoscopy Indications:              Surveillance: Personal history of adenomatous                            polyps on last colonoscopy 5 years ago Medicines:                Monitored Anesthesia Care Procedure:                Pre-Anesthesia Assessment:                           - Prior to the procedure, a History and Physical                            was performed, and patient medications and                            allergies were reviewed. The patient's tolerance of                            previous anesthesia was also reviewed. The risks                            and benefits of the procedure and the sedation                            options and risks were discussed with the patient.                            All questions were answered, and informed consent                            was obtained. Prior Anticoagulants: The patient has                            taken no previous anticoagulant or antiplatelet                            agents. ASA Grade Assessment: II - A patient with                            mild systemic disease. After reviewing the risks                            and benefits, the patient was deemed in                            satisfactory condition to undergo the procedure.  After obtaining informed consent, the colonoscope                            was passed under direct vision. Throughout the                            procedure, the patient's blood pressure, pulse, and                            oxygen saturations were monitored continuously. The                            Colonoscope was introduced through the anus and                            advanced to the the  cecum, identified by                            appendiceal orifice and ileocecal valve. The                            ileocecal valve, appendiceal orifice, and rectum                            were photographed. The quality of the bowel                            preparation was excellent. The colonoscopy was                            performed without difficulty. The patient tolerated                            the procedure well. Scope In: 8:45:52 AM Scope Out: 8:58:58 AM Scope Withdrawal Time: 0 hours 9 minutes 15 seconds  Total Procedure Duration: 0 hours 13 minutes 6 seconds  Findings:                 The perianal exam findings include non-thrombosed                            external hemorrhoids.                           A 6 mm polyp was found in the transverse colon. The                            polyp was sessile. The polyp was removed with a                            cold snare. Resection and retrieval were complete.                           Multiple medium-mouthed diverticula were found in  the left colon. There was no evidence of                            diverticular bleeding.                           Internal hemorrhoids were found during                            retroflexion. The hemorrhoids were small and Grade                            I (internal hemorrhoids that do not prolapse).                           The exam was otherwise without abnormality on                            direct and retroflexion views. Complications:            No immediate complications. Estimated blood loss:                            None. Estimated Blood Loss:     Estimated blood loss: none. Impression:               - Non-thrombosed external hemorrhoids found on                            perianal exam.                           - One 6 mm polyp in the transverse colon, removed                            with a cold snare. Resected and retrieved.                            - Moderate diverticulosis in the left colon. There                            was no evidence of diverticular bleeding.                           - Internal hemorrhoids.                           - The examination was otherwise normal on direct                            and retroflexion views. Recommendation:           - Repeat colonoscopy in 5 years for surveillance.                           - Patient has a contact number available for  emergencies. The signs and symptoms of potential                            delayed complications were discussed with the                            patient. Return to normal activities tomorrow.                            Written discharge instructions were provided to the                            patient.                           - Resume previous diet.                           - Continue present medications.                           - Await pathology results. Ladene Artist, MD 05/14/2017 9:04:24 AM This report has been signed electronically.

## 2017-05-14 NOTE — Progress Notes (Signed)
Called to room to assist during endoscopic procedure.  Patient ID and intended procedure confirmed with present staff. Received instructions for my participation in the procedure from the performing physician.Called to room to assist during endoscopic procedure.  Patient ID and intended procedure confirmed with present staff. Received instructions for my participation in the procedure from the performing physician. 

## 2017-05-14 NOTE — Progress Notes (Signed)
Spontaneous respirations throughout. VSS. Resting comfortably. To PACU on room air. Report to  RN. 

## 2017-05-14 NOTE — Patient Instructions (Signed)
YOU HAD AN ENDOSCOPIC PROCEDURE TODAY AT [facility]:   Refer to the procedure report that was given to you for any specific questions about what was found during the examination.  If the procedure report does not answer your questions, please call your gastroenterologist to clarify.  If you requested that your care partner not be given the details of your procedure findings, then the procedure report has been included in a sealed envelope for you to review at your convenience later.  YOU SHOULD EXPECT: Some feelings of bloating in the abdomen. Passage of more gas than usual.  Walking can help get rid of the air that was put into your GI tract during the procedure and reduce the bloating. If you had a lower endoscopy (such as a colonoscopy or flexible sigmoidoscopy) you may notice spotting of blood in your stool or on the toilet paper. If you underwent a bowel prep for your procedure, you may not have a normal bowel movement for a few days.  Please Note:  You might notice some irritation and congestion in your nose or some drainage.  This is from the oxygen used during your procedure.  There is no need for concern and it should clear up in a day or so.  SYMPTOMS TO REPORT IMMEDIATELY:   Following lower endoscopy (colonoscopy or flexible sigmoidoscopy):  Excessive amounts of blood in the stool  Significant tenderness or worsening of abdominal pains  Swelling of the abdomen that is new, acute  Fever of 100F or higher   For urgent or emergent issues, a gastroenterologist can be reached at any hour by calling [phone].   DIET:  We do recommend a small meal at first, but then you may proceed to your regular diet.  Drink plenty of fluids but you should avoid alcoholic beverages for 24 hours.  ACTIVITY:  You should plan to take it easy for the rest of today and you should NOT DRIVE or use heavy machinery until tomorrow (because of the sedation medicines used during the test).     FOLLOW UP: Our staff will call the number listed on your records the next business day following your procedure to check on you and address any questions or concerns that you may have regarding the information given to you following your procedure. If we do not reach you, we will leave a message.  However, if you are feeling well and you are not experiencing any problems, there is no need to return our call.  We will assume that you have returned to your regular daily activities without incident.  If any biopsies were taken you will be contacted by phone or by letter within the next 1-3 weeks.  Please call us at [phone] if you have not heard about the biopsies in 3 weeks.    SIGNATURES/CONFIDENTIALITY: You and/or your care partner have signed paperwork which will be entered into your electronic medical record.  These signatures attest to the fact that that the information above on your After Visit Summary has been reviewed and is understood.  Full responsibility of the confidentiality of this discharge information lies with you and/or your care-partner.  Polyp, diverticulosis, high fiber and hemorrhoid information given.

## 2017-05-15 ENCOUNTER — Telehealth: Payer: Self-pay | Admitting: Internal Medicine

## 2017-05-15 ENCOUNTER — Telehealth: Payer: Self-pay | Admitting: *Deleted

## 2017-05-15 MED ORDER — IRBESARTAN 300 MG PO TABS: 300.0000 mg | ORAL_TABLET | Freq: Every day | ORAL | 3 refills | Status: DC

## 2017-05-15 NOTE — Telephone Encounter (Signed)
Pt called and her losartan (COZAAR) 100 MG tablet Was recalled she needs an alternative  [facility]th[facility]  Please advise

## 2017-05-15 NOTE — Telephone Encounter (Signed)
Irbesartan Rx emailed Thx

## 2017-05-15 NOTE — Telephone Encounter (Signed)
  Follow up Call-  Call back number 05/14/2017  Post procedure Call Back phone  # [phone]  Permission to leave phone message Yes  Some recent data might be hidden     Patient questions:  Do you have a fever, pain , or abdominal swelling? No. Pain Score  0 *  Have you tolerated food without any problems? Yes.    Have you been able to return to your normal activities? Yes.    Do you have any questions about your discharge instructions: Diet   No. Medications  No. Follow up visit  No.  Do you have questions or concerns about your Care? No.  Actions: * If pain score is 4 or above: No action needed, pain <4.

## 2017-05-16 NOTE — Telephone Encounter (Signed)
Called pt no answer LMOM MD sent new BP med to [facility]...Courtney Nielsen

## 2017-05-17 ENCOUNTER — Ambulatory Visit (INDEPENDENT_AMBULATORY_CARE_PROVIDER_SITE_OTHER): Payer: BLUE CROSS/BLUE SHIELD

## 2017-05-17 ENCOUNTER — Encounter: Payer: Self-pay | Admitting: Podiatry

## 2017-05-17 ENCOUNTER — Ambulatory Visit (INDEPENDENT_AMBULATORY_CARE_PROVIDER_SITE_OTHER): Payer: BLUE CROSS/BLUE SHIELD | Admitting: Podiatry

## 2017-05-17 VITALS — BP 134/82 | HR 86 | Resp 16

## 2017-05-17 DIAGNOSIS — M722 Plantar fascial fibromatosis: Secondary | ICD-10-CM

## 2017-05-17 MED ORDER — TRIAMCINOLONE ACETONIDE 10 MG/ML IJ SUSP
10.0000 mg | Freq: Once | INTRAMUSCULAR | Status: AC
  Administered 2017-05-17: 10 mg

## 2017-05-17 NOTE — Progress Notes (Signed)
   Subjective:    Patient ID: Courtney Nielsen, female    DOB: [birth year], 61 y.o.   MRN: [id number]  HPI    Review of Systems  All other systems reviewed and are negative.      Objective:   Physical Exam        Assessment & Plan:

## 2017-05-17 NOTE — Patient Instructions (Signed)

## 2017-05-22 NOTE — Progress Notes (Signed)
Subjective:    [name], female   DOB: 61 y.o.   MRN: [id number]   HPI patient presents stating that she is having a lot of pain in her left heel and it's worse when she gets up in the morning or after periods of sitting. Does not remember specific injury and patient does not smoke currently and likes to be active    Review of Systems  All other systems reviewed and are negative.       Objective:  Physical Exam  Constitutional: She appears well-developed and well-nourished.  Cardiovascular: Intact distal pulses.  Pulmonary/Chest: Effort normal.  Musculoskeletal: Normal range of motion.  Neurological: She is alert.  Skin: Skin is warm.  Nursing note and vitals reviewed.  neurovascular status intact muscle strength adequate range of motion within normal limits with patient noted to have exquisite discomfort plantar aspect left heel at the insertional point of the tendon into the calcaneus. There is inflammation fluid around the medial band and it is very tender when pressed with moderate depression of the arch     Assessment:    Acute plantar fasciitis left at the insertional point of tendon into the calcaneus with mechanical dysfunction     Plan:    H&P condition reviewed reviewed x-rays and today injected the plantar fascial left 3 mg Kenalog 5 mg Xylocaine and applied fascial brace with instructions on usage. Patient also had discussion about orthotics which I think would be of good use to her long-term and we will decide at next visit and she'll be seen back in 2 weeks  X-rays indicate there is small spur with no indications of stress fracture with moderate depression of the arch

## 2017-05-28 ENCOUNTER — Encounter: Payer: Self-pay | Admitting: Gastroenterology

## 2017-05-31 ENCOUNTER — Ambulatory Visit: Payer: BLUE CROSS/BLUE SHIELD | Admitting: Podiatry

## 2017-06-05 ENCOUNTER — Encounter: Payer: Self-pay | Admitting: Podiatry

## 2017-06-05 ENCOUNTER — Ambulatory Visit (INDEPENDENT_AMBULATORY_CARE_PROVIDER_SITE_OTHER): Payer: BLUE CROSS/BLUE SHIELD | Admitting: Podiatry

## 2017-06-05 DIAGNOSIS — M722 Plantar fascial fibromatosis: Secondary | ICD-10-CM

## 2017-06-05 MED ORDER — TRIAMCINOLONE ACETONIDE 10 MG/ML IJ SUSP
10.0000 mg | Freq: Once | INTRAMUSCULAR | Status: AC
  Administered 2017-06-05: 10 mg

## 2017-06-05 NOTE — Progress Notes (Signed)
Subjective:   Patient ID: Courtney Nielsen, female   DOB: 61 y.o.   MRN: [id number]   HPI Patient presents stating I am still getting a lot of pain in my left heel and it is worse when I get up in the morning or after periods of sitting.  I have had improvement but it come back quite a bit   ROS      Objective:  Physical Exam  Neurovascular status intact with patient found to have continued discomfort plantar aspect left heel with pain upon deep palpation to the insertional point of the tendon     Assessment:  Plantar fasciitis reoccurrence left with inflammation fluid with depression of the arch also noted     Plan:  H&P condition reviewed and went ahead and reinjected the plantar fascia 3 mg Kenalog 5 mg lidocaine and dispensed night splint with all instructions on usage.  I then went ahead today and I casted for a [place] type orthotic to provide for plantar arch support with a deep 12 mm heel seat.  Patient will be seen when those are returned

## 2017-06-06 DIAGNOSIS — M1711 Unilateral primary osteoarthritis, right knee: Secondary | ICD-10-CM | POA: Diagnosis not present

## 2017-06-27 ENCOUNTER — Other Ambulatory Visit: Payer: BLUE CROSS/BLUE SHIELD | Admitting: Orthotics

## 2017-07-01 ENCOUNTER — Other Ambulatory Visit: Payer: BLUE CROSS/BLUE SHIELD | Admitting: Orthotics

## 2017-07-18 DIAGNOSIS — Z96659 Presence of unspecified artificial knee joint: Secondary | ICD-10-CM | POA: Insufficient documentation

## 2017-07-18 DIAGNOSIS — M1711 Unilateral primary osteoarthritis, right knee: Secondary | ICD-10-CM | POA: Diagnosis not present

## 2017-08-07 ENCOUNTER — Ambulatory Visit: Payer: Self-pay | Admitting: *Deleted

## 2017-08-07 NOTE — Telephone Encounter (Signed)
fyi

## 2017-08-07 NOTE — Telephone Encounter (Signed)
Pt reports left sided chest pain under breast area. Onset yesterday afternoon "when told sister in law had died during surgery." States took xanax yesterday with relief, pain is back this am 4/10 at times, "somewhat " worse with exertion. Denies nausea, dizziness, SOB, diaphoresis; pain does not radiate. States "a little" worse with deep breaths. No other symptoms. Directed to UC/ED which pt declines. Pt states it is better from yesterday so she will "wait and see." Instructed to call back or go to ED if symptoms worsen. Reason for Disposition . Chest pain lasts > 5 minutes (Exceptions: chest pain occurring > 3 days ago and now asymptomatic; same as previously diagnosed heartburn and has accompanying sour taste in mouth)  Answer Assessment - Initial Assessment Questions 1. LOCATION: "Where does it hurt?"      Left side of chest, under breast 2. RADIATION: "Does the pain go anywhere else?" (e.g., into neck, jaw, arms, back)     No 3. ONSET: "When did the chest pain begin?" (Minutes, hours or days)      Yesterday 4. PATTERN "Does the pain come and go, or has it been constant since it started?"  "Does it get worse with exertion?"      Yesterday constant, comes and goes today. "Little worse with exertion." 5. DURATION: "How long does it last" (e.g., seconds, minutes, hours)      6. SEVERITY: "How bad is the pain?"  (e.g., Scale 1-10; mild, moderate, or severe)    - MILD (1-3): doesn't interfere with normal activities     - MODERATE (4-7): interferes with normal activities or awakens from sleep    - SEVERE (8-10): excruciating pain, unable to do any normal activities       4/10 at times milder. Worse with deep breath. 7. CARDIAC RISK FACTORS: "Do you have any history of heart problems or risk factors for heart disease?" (e.g., prior heart attack, angina; high blood pressure, diabetes, being overweight, high cholesterol, smoking, or strong family history of heart disease)     No 8. PULMONARY RISK  FACTORS: "Do you have any history of lung disease?"  (e.g., blood clots in lung, asthma, emphysema, birth control pills)     No 9. CAUSE: "What do you think is causing the chest pain?"     "Could be panic attack.Started yesterday when told sister in law had died." 26. OTHER SYMPTOMS: "Do you have any other symptoms?" (e.g., dizziness, nausea, vomiting, sweating, fever, difficulty breathing, cough)      N0  Protocols used: CHEST PAIN-A-AH

## 2017-08-11 NOTE — Telephone Encounter (Signed)
Noted.  I am sorry about her sisters-in-law passing.  Thank you

## 2017-08-29 ENCOUNTER — Ambulatory Visit (INDEPENDENT_AMBULATORY_CARE_PROVIDER_SITE_OTHER)
Admission: RE | Admit: 2017-08-29 | Discharge: 2017-08-29 | Disposition: A | Discharge status: Home / Self Care | Payer: BLUE CROSS/BLUE SHIELD | Source: Ambulatory Visit | Attending: Family Medicine | Admitting: Family Medicine

## 2017-08-29 ENCOUNTER — Other Ambulatory Visit: Payer: BLUE CROSS/BLUE SHIELD

## 2017-08-29 ENCOUNTER — Encounter: Payer: Self-pay | Admitting: Family Medicine

## 2017-08-29 ENCOUNTER — Ambulatory Visit: Payer: BLUE CROSS/BLUE SHIELD | Admitting: Family Medicine

## 2017-08-29 VITALS — BP 142/88 | HR 116 | Temp 99.2°F | Ht 70.0 in | Wt 234.0 lb

## 2017-08-29 DIAGNOSIS — R05 Cough: Secondary | ICD-10-CM | POA: Diagnosis not present

## 2017-08-29 DIAGNOSIS — M898X1 Other specified disorders of bone, shoulder: Secondary | ICD-10-CM | POA: Diagnosis not present

## 2017-08-29 DIAGNOSIS — R000 Tachycardia, unspecified: Secondary | ICD-10-CM | POA: Diagnosis not present

## 2017-08-29 DIAGNOSIS — R0602 Shortness of breath: Secondary | ICD-10-CM | POA: Diagnosis not present

## 2017-08-29 DIAGNOSIS — R079 Chest pain, unspecified: Secondary | ICD-10-CM | POA: Diagnosis not present

## 2017-08-29 NOTE — Assessment & Plan Note (Signed)
This seems to be MSK as it's reproduced with palpation on exam.  - counseled on supportive care - if testing excludes other sources of the pain and is MSK could consider PT if no improvement.

## 2017-08-29 NOTE — Assessment & Plan Note (Signed)
Unclear if she has an infectious cause or vascular. She has no prior history of blood to suggest DVT and PE. Nothing on exam to suggest DVT. Possible for infection. No history of asthma. Review of CT chest from 2010 shows COPD but doesn't appear to be an exacerbation. Less likely for dissection or cardiac.  - EKG  - chest xray and D dimer  - if these studies suggest further work then may need to inform to be seen in the ED or CT angio or treatment with ABX  - given indications to seek immediate care and follow up.

## 2017-08-29 NOTE — Patient Instructions (Signed)
We will call you with the results from today  Please seek immediate care if you feel your symptoms get worse.

## 2017-08-29 NOTE — Progress Notes (Signed)
Courtney Nielsen - 62 y.o. female MRN [id number]  Date of birth: 18-Jun-1956  SUBJECTIVE:  Including CC & ROS.  Chief Complaint  Patient presents with  . Cough    Courtney Nielsen is a 62 y.o. female that is presenting with a cough. Ongoing for one week. Admits to pain when taking a deep breath. Admits to wheezing and shortness of breath. Denies fevers, body aches or chills. She has been coughing up brown mucous. She has not been taking for her symptoms. She is having left sided sternal pain. This pain is associated with taking a deep breath. She is having some production with the cough. Non bloody. No history of PE or blood clot. Having pain that is occurring periscapular in nature. This pain started first a few weeks ago and then she started noticing the left sided sternal pain. No recent travel. The pain is staying the same. No nausea, diaphoresis or radiation to her arm. No longer smokes. She becomes short of breath the longer she walks and the pain doesn't increase.   Review of stress ECHO from 2010 shows a normal study.    Review of Systems  Constitutional: Negative for fever.  Respiratory: Positive for cough and shortness of breath.   Cardiovascular: Negative for leg swelling.  Gastrointestinal: Negative for abdominal pain.  Musculoskeletal: Negative for arthralgias.  Neurological: Negative for weakness.  Hematological: Negative for adenopathy.  Psychiatric/Behavioral: Negative for agitation.    HISTORY: Past Medical, Surgical, Social, and Family History Reviewed & Updated per EMR.   Pertinent Historical Findings include:  Past Medical History:  Diagnosis Date  . Allergic rhinitis   . Allergy   . Arthritis   . Cataract    right eye removed with lens implant   . Chest pain   . Colonic polyp   . COPD (chronic obstructive pulmonary disease) ([place])    patient denies at preop on ;   . Dysrhythmia    hx of every third beat is " off" per patient   . Hypertension   . Pneumonia     hx of 2014   . Smoker   . Vitamin B12 deficiency     Past Surgical History:  Procedure Laterality Date  . CARDIAC CATHETERIZATION     cath negative per patient   . CATARACT EXTRACTION W/PHACO Right 06/11/2016   Procedure: CATARACT EXTRACTION PHACO AND INTRAOCULAR LENS PLACEMENT RIGHT EYE CDE=6.15;  Surgeon: Tonny Branch, MD;  Location: [facility];  Service: Ophthalmology;  Laterality: Right;  right  . COLONOSCOPY    . KNEE ARTHROSCOPY Right 10/19/2015   Procedure: RIGHT KNEE SCOPE WITH MENISCAL DEBRIDEMENT,LATERAL SYNOVECTOMY;  Surgeon: Gaynelle Arabian, MD;  Location: [facility];  Service: Orthopedics;  Laterality: Right;  . POLYPECTOMY    . TOTAL KNEE ARTHROPLASTY Right 12/03/2016   Procedure: RIGHT TOTAL KNEE ARTHROPLASTY;  Surgeon: Gaynelle Arabian, MD;  Location: [facility];  Service: Orthopedics;  Laterality: Right;    Allergies  Allergen Reactions  . Meloxicam Swelling and Other (See Comments)    Leg swelling  . Dilaudid [Hydromorphone Hcl]     Burning of the face  . Levaquin [Levofloxacin]     Head itching  . Ceftin [Cefuroxime Axetil] Itching and Other (See Comments)    pruritis  . Coreg [Carvedilol] Nausea Only  . Furosemide Other (See Comments)    cramps  . Prednisone Itching and Other (See Comments)    Patient states she has tolerated  . Sulfonamide Derivatives Nausea And Vomiting  . Verapamil  Other (See Comments)    edema    Family History  Problem Relation Age of Onset  . Arthritis Mother   . Heart disease Mother   . COPD Father   . Cancer Father        Throat and stomach  . Arthritis Father   . Lung cancer Father   . Lymphoma Father   . Esophageal cancer Father   . Asthma Brother   . Emphysema Paternal Grandfather   . Hypertension Other   . Diabetes Maternal Aunt   . Colon cancer Neg Hx   . Colon polyps Neg Hx   . Rectal cancer Neg Hx   . Stomach cancer Neg Hx      Social History   Socioeconomic History  . Marital status: Married    Spouse name: Not on  file  . Number of children: Not on file  . Years of education: Not on file  . Highest education level: Not on file  Social Needs  . Financial resource strain: Not on file  . Food insecurity - worry: Not on file  . Food insecurity - inability: Not on file  . Transportation needs - medical: Not on file  . Transportation needs - non-medical: Not on file  Occupational History  . Not on file  Tobacco Use  . Smoking status: Former Smoker    Packs/day: 1.00    Years: 20.00    Pack years: 20.00    Types: Cigarettes    Last attempt to quit: 07/03/2011    Years since quitting: 6.1  . Smokeless tobacco: Never Used  Substance and Sexual Activity  . Alcohol use: No  . Drug use: No  . Sexual activity: Yes    Birth control/protection: Post-menopausal  Other Topics Concern  . 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)     PHYSICAL EXAM:  VS: BP (!) 142/88 (BP Location: Left Arm, Patient Position: Sitting, Cuff Size: Normal)   Pulse (!) 116   Temp 99.2 F (37.3 C) (Oral)   Ht 5\' 10"  (1.778 m)   Wt 234 lb (106.1 kg)   SpO2 98%   BMI 33.58 kg/m  Physical Exam Gen: NAD, alert, cooperative with exam,  ENT: normal lips, normal nasal mucosa Eye: normal EOM, normal conjunctiva and lids CV:  no edema, +2 pedal pulses, regular rhythm, S1-S2, tachycardia    Resp: no accessory muscle use, non-labored, clear to auscultation bilaterally, no crackles or wheezes Skin: no rashes, no areas of induration  Neuro: normal tone, normal sensation to touch Psych:  normal insight, alert and oriented MSK: Chest:  TTP of the left sternal border  No step offs  Left shoulder:  Normal ROM  TTP of the inferior border of the scapula  Normal empty can  Normal grip strength  Neurovascularly intact.   EKG: sinus tachycardia   ASSESSMENT & PLAN:   Tachycardia Unclear if she has an infectious cause or vascular. She has no prior history of  blood to suggest DVT and PE. Nothing on exam to suggest DVT. Possible for infection. No history of asthma. Review of CT chest from 2010 shows COPD but doesn't appear to be an exacerbation. Less likely for dissection or cardiac.  - EKG  - chest xray and D dimer  - if these studies suggest further work then may need to inform to be seen in the ED or CT angio or treatment with ABX  -  given indications to seek immediate care and follow up.   Periscapular pain This seems to be MSK as it's reproduced with palpation on exam.  - counseled on supportive care - if testing excludes other sources of the pain and is MSK could consider PT if no improvement.

## 2017-08-30 ENCOUNTER — Other Ambulatory Visit (HOSPITAL_COMMUNITY)
Admission: RE | Admit: 2017-08-30 | Discharge: 2017-08-30 | Disposition: A | Discharge status: Home / Self Care | Payer: BLUE CROSS/BLUE SHIELD | Source: Ambulatory Visit | Attending: Family Medicine | Admitting: Family Medicine

## 2017-08-30 ENCOUNTER — Other Ambulatory Visit: Payer: Self-pay | Admitting: Family Medicine

## 2017-08-30 ENCOUNTER — Telehealth: Payer: Self-pay | Admitting: Family Medicine

## 2017-08-30 ENCOUNTER — Ambulatory Visit (HOSPITAL_COMMUNITY)
Admission: RE | Admit: 2017-08-30 | Discharge: 2017-08-30 | Disposition: A | Discharge status: Home / Self Care | Payer: BLUE CROSS/BLUE SHIELD | Source: Ambulatory Visit | Attending: Family Medicine | Admitting: Family Medicine

## 2017-08-30 DIAGNOSIS — I700 Atherosclerosis of aorta: Secondary | ICD-10-CM | POA: Insufficient documentation

## 2017-08-30 DIAGNOSIS — I313 Pericardial effusion (noninflammatory): Secondary | ICD-10-CM | POA: Insufficient documentation

## 2017-08-30 DIAGNOSIS — M898X1 Other specified disorders of bone, shoulder: Secondary | ICD-10-CM | POA: Diagnosis not present

## 2017-08-30 DIAGNOSIS — R000 Tachycardia, unspecified: Secondary | ICD-10-CM | POA: Diagnosis not present

## 2017-08-30 DIAGNOSIS — J9811 Atelectasis: Secondary | ICD-10-CM | POA: Insufficient documentation

## 2017-08-30 DIAGNOSIS — R0602 Shortness of breath: Secondary | ICD-10-CM | POA: Diagnosis not present

## 2017-08-30 DIAGNOSIS — R7989 Other specified abnormal findings of blood chemistry: Secondary | ICD-10-CM | POA: Diagnosis not present

## 2017-08-30 LAB — BASIC METABOLIC PANEL
ANION GAP: 10 (ref 5–15)
BUN: 9 mg/dL (ref 6–20)
CALCIUM: 8.6 mg/dL — AB (ref 8.9–10.3)
CO2: 23 mmol/L (ref 22–32)
Chloride: 106 mmol/L (ref 101–111)
Creatinine, Ser: 0.88 mg/dL (ref 0.44–1.00)
Glucose, Bld: 103 mg/dL — ABNORMAL HIGH (ref 65–99)
Potassium: 3.6 mmol/L (ref 3.5–5.1)
SODIUM: 139 mmol/L (ref 135–145)

## 2017-08-30 LAB — POCT I-STAT CREATININE: CREATININE: 0.9 mg/dL (ref 0.44–1.00)

## 2017-08-30 LAB — D-DIMER, QUANTITATIVE: D-Dimer, Quant: 0.75 mcg/mL FEU — ABNORMAL HIGH (ref <0.50)

## 2017-08-30 MED ORDER — OSELTAMIVIR PHOSPHATE 75 MG PO CAPS: 75.0000 mg | ORAL_CAPSULE | Freq: Two times a day (BID) | ORAL | 0 refills | Status: DC

## 2017-08-30 MED ORDER — AZITHROMYCIN 250 MG PO TABS: ORAL_TABLET | ORAL | 0 refills | Status: DC

## 2017-08-30 MED ORDER — IOPAMIDOL (ISOVUE-370) INJECTION 76%
100.0000 mL | Freq: Once | INTRAVENOUS | Status: AC | PRN
  Administered 2017-08-30: 100 mL via INTRAVENOUS

## 2017-08-30 NOTE — Telephone Encounter (Signed)
Spoke with patient about her symptoms. Developed a fever last night. Chest xray was negative for infection but d dimer was slightly elevated. Discussed would pursue obtaining a CTA. If normal then her symptoms may be related to the flu.   Rosemarie Ax, MD [facility] 08/30/2017, 8:31 AM

## 2017-08-30 NOTE — Telephone Encounter (Signed)
Spoke with patient about her CT scan. Still having fevers today. Possible for flu and history of COPD. Will send tamiflu. 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 Will try azithromycin as well.   Rosemarie Ax, MD [facility] 08/30/2017, 2:18 PM

## 2017-10-02 ENCOUNTER — Ambulatory Visit: Payer: BLUE CROSS/BLUE SHIELD | Admitting: Internal Medicine

## 2017-10-02 ENCOUNTER — Encounter: Payer: Self-pay | Admitting: Internal Medicine

## 2017-10-02 DIAGNOSIS — R0789 Other chest pain: Secondary | ICD-10-CM

## 2017-10-02 DIAGNOSIS — E538 Deficiency of other specified B group vitamins: Secondary | ICD-10-CM

## 2017-10-02 MED ORDER — CELECOXIB 200 MG PO CAPS: 200.0000 mg | ORAL_CAPSULE | Freq: Two times a day (BID) | ORAL | 5 refills | Status: DC

## 2017-10-02 MED ORDER — ALPRAZOLAM 0.5 MG PO TABS: ORAL_TABLET | ORAL | 5 refills | Status: DC

## 2017-10-02 NOTE — Assessment & Plan Note (Signed)
?  pericarditis on chest CT 2/19; no PE - pain resolved

## 2017-10-02 NOTE — Patient Instructions (Signed)
Plantar Fasciitis Plantar fasciitis is a painful foot condition that affects the heel. It occurs when the band of tissue that connects the toes to the heel bone (plantar fascia) becomes irritated. This can happen after exercising too much or doing other repetitive activities (overuse injury). The pain from plantar fasciitis can range from mild irritation to severe pain that makes it difficult for you to walk or move. The pain is usually worse in the morning or after you have been sitting or lying down for a while. What are the causes? This condition may be caused by:  Standing for long periods of time.  Wearing shoes that do not fit.  Doing high-impact activities, including running, aerobics, and ballet.  Being overweight.  Having an abnormal way of walking (gait).  Having tight calf muscles.  Having high arches in your feet.  Starting a new athletic activity.  What are the signs or symptoms? The main symptom of this condition is heel pain. Other symptoms include:  Pain that gets worse after activity or exercise.  Pain that is worse in the morning or after resting.  Pain that goes away after you walk for a few minutes.  How is this diagnosed? This condition may be diagnosed based on your signs and symptoms. Your health care provider will also do a physical exam to check for:  A tender area on the bottom of your foot.  A high arch in your foot.  Pain when you move your foot.  Difficulty moving your foot.  You may also need to have imaging studies to confirm the diagnosis. These can include:  X-rays.  Ultrasound.  MRI.  How is this treated? Treatment for plantar fasciitis depends on the severity of the condition. Your treatment may include:  Rest, ice, and over-the-counter pain medicines to manage your pain.  Exercises to stretch your calves and your plantar fascia.  A splint that holds your foot in a stretched, upward position while you sleep (night  splint).  Physical therapy to relieve symptoms and prevent problems in the future.  Cortisone injections to relieve severe pain.  Extracorporeal shock wave therapy (ESWT) to stimulate damaged plantar fascia with electrical impulses. It is often used as a last resort before surgery.  Surgery, if other treatments have not worked after 12 months.  Follow these instructions at home:  Take medicines only as directed by your health care provider.  Avoid activities that cause pain.  Roll the bottom of your foot over a bag of ice or a bottle of cold water. Do this for 20 minutes, 3-4 times a day.  Perform simple stretches as directed by your health care provider.  Try wearing athletic shoes with air-sole or gel-sole cushions or soft shoe inserts.  Wear a night splint while sleeping, if directed by your health care provider.  Keep all follow-up appointments with your health care provider. How is this prevented?  Do not perform exercises or activities that cause heel pain.  Consider finding low-impact activities if you continue to have problems.  Lose weight if you need to. The best way to prevent plantar fasciitis is to avoid the activities that aggravate your plantar fascia. Contact a health care provider if:  Your symptoms do not go away after treatment with home care measures.  Your pain gets worse.  Your pain affects your ability to move or do your daily activities. This information is not intended to replace advice given to you by your health care provider. Make sure you   discuss any questions you have with your health care provider. Document Released: 03/13/2001 Document Revised: 11/21/2015 Document Reviewed: 04/28/2014 Elsevier Interactive Patient Education  2018 [employer] Inc[employer]   Plantar Fasciitis Rehab Ask your health care provider which exercises are safe for you. Do exercises exactly as told by your health care provider and adjust them as directed. It is normal to feel  mild stretching, pulling, tightness, or discomfort as you do these exercises, but you should stop right away if you feel sudden pain or your pain gets worse. Do not begin these exercises until told by your health care provider. Stretching and range of motion exercises These exercises warm up your muscles and joints and improve the movement and flexibility of your foot. These exercises also help to relieve pain. Exercise A: Plantar fascia stretch  1. Sit with your left / right leg crossed over your opposite knee. 2. Hold your heel with one hand with that thumb near your arch. With your other hand, hold your toes and gently pull them back toward the top of your foot. You should feel a stretch on the bottom of your toes or your foot or both. 3. Hold this stretch for__________ seconds. 4. Slowly release your toes and return to the starting position. Repeat __________ times. Complete this exercise __________ times a day. Exercise B: Gastroc, standing  1. Stand with your hands against a wall. 2. Extend your left / right leg behind you, and bend your front knee slightly. 3. Keeping your heels on the floor and keeping your back knee straight, shift your weight toward the wall without arching your back. You should feel a gentle stretch in your left / right calf. 4. Hold this position for __________ seconds. Repeat __________ times. Complete this exercise __________ times a day. Exercise C: Soleus, standing 1. Stand with your hands against a wall. 2. Extend your left / right leg behind you, and bend your front knee slightly. 3. Keeping your heels on the floor, bend your back knee and slightly shift your weight over the back leg. You should feel a gentle stretch deep in your calf. 4. Hold this position for __________ seconds. Repeat __________ times. Complete this exercise __________ times a day. Exercise D: Gastrocsoleus, standing 1. Stand with the ball of your left / right foot on a step. The ball of  your foot is on the walking surface, right under your toes. 2. Keep your other foot firmly on the same step. 3. Hold onto the wall or a railing for balance. 4. Slowly lift your other foot, allowing your body weight to press your heel down over the edge of the step. You should feel a stretch in your left / right calf. 5. Hold this position for __________ seconds. 6. Return both feet to the step. 7. Repeat this exercise with a slight bend in your left / right knee. Repeat __________ times with your left / right knee straight and __________ times with your left / right knee bent. Complete this exercise __________ times a day. Balance exercise This exercise builds your balance and strength control of your arch to help take pressure off your plantar fascia. Exercise E: Single leg stand 1. Without shoes, stand near a railing or in a doorway. You may hold onto the railing or door frame as needed. 2. Stand on your left / right foot. Keep your big toe down on the floor and try to keep your arch lifted. Do not let your foot roll inward. 3. Hold   this position for __________ seconds. 4. If this exercise is too easy, you can try it with your eyes closed or while standing on a pillow. Repeat __________ times. Complete this exercise __________ times a day. This information is not intended to replace advice given to you by your health care provider. Make sure you discuss any questions you have with your health care provider. Document Released: 06/18/2005 Document Revised: 02/21/2016 Document Reviewed: 05/02/2015 Elsevier Interactive Patient Education  2018 Elsevier Inc.   

## 2017-10-02 NOTE — Progress Notes (Signed)
Subjective:  Patient ID: Courtney Nielsen, female    DOB: Nov 15, 1955  Age: 63 y.o. MRN: [id number]  CC: No chief complaint on file.   HPI Courtney Nielsen presents for anxiety, HTN,  CP (CT w/o PE) (2/19)  Outpatient Medications Prior to Visit  Medication Sig Dispense Refill  . acetaminophen (TYLENOL) 500 MG tablet Take 1,000 mg by mouth every 6 (six) hours as needed for moderate pain.    Marland Kitchen ALPRAZolam (XANAX) 0.5 MG tablet TAKE ONE TABLET BY MOUTH AT BEDTIME AS NEEDED FOR ANXIETY. 30 tablet 3  . aspirin EC 81 MG tablet Take 81 mg by mouth daily.    . celecoxib (CELEBREX) 200 MG capsule Take 200 mg by mouth 2 (two) times daily.    . Cholecalciferol (VITAMIN D3 PO) Take 50 mcg by mouth daily.    . Cyanocobalamin (VITAMIN B 12 PO) Take 1,000 mcg by mouth daily.    Marland Kitchen estradiol (ESTRACE) 0.5 MG tablet estradiol 0.5 mg tablet  TAKE 1 TABLET BY MOUTH DAILY    . irbesartan (AVAPRO) 300 MG tablet Take 1 tablet (300 mg total) daily by mouth. 90 tablet 3  . loratadine (CLARITIN) 10 MG tablet Take 10 mg by mouth at bedtime.    . medroxyPROGESTERone (PROVERA) 2.5 MG tablet medroxyprogesterone 2.5 mg tablet  TAKE 1 T BY MOUTH DAILY    . Menthol, Topical Analgesic, (BLUE-EMU MAXIMUM STRENGTH EX) Apply 1 application topically daily.     . Omega-3 Fatty Acids (FISH OIL) 500 MG CAPS Take 1 capsule by mouth daily.    Marland Kitchen azithromycin (ZITHROMAX) 250 MG tablet Take 500 mg the first day then 250 mg the next 4 days. Total of 5 days. 6 tablet 0  . oseltamivir (TAMIFLU) 75 MG capsule Take 1 capsule (75 mg total) by mouth 2 (two) times daily. 10 capsule 0   No facility-administered medications prior to visit.     ROS Review of Systems  Constitutional: Negative for activity change, appetite change, chills, fatigue and unexpected weight change.  HENT: Negative for congestion, mouth sores and sinus pressure.   Eyes: Negative for visual disturbance.  Respiratory: Negative for cough and chest tightness.     Gastrointestinal: Negative for abdominal pain and nausea.  Genitourinary: Negative for difficulty urinating, frequency and vaginal pain.  Musculoskeletal: Positive for arthralgias and back pain. Negative for gait problem.  Skin: Negative for pallor and rash.  Neurological: Negative for dizziness, tremors, weakness, numbness and headaches.  Psychiatric/Behavioral: Negative for confusion and sleep disturbance.    Objective:  BP 132/84 (BP Location: Left Arm, Patient Position: Sitting, Cuff Size: Large)   Pulse 77   Temp 98.2 F (36.8 C) (Oral)   Ht 5\' 10"  (1.778 m)   Wt 234 lb (106.1 kg)   SpO2 99%   BMI 33.58 kg/m   BP Readings from Last 3 Encounters:  10/02/17 132/84  08/29/17 (!) 142/88  05/17/17 134/82    Wt Readings from Last 3 Encounters:  10/02/17 234 lb (106.1 kg)  08/29/17 234 lb (106.1 kg)  05/14/17 228 lb (103.4 kg)    Physical Exam  Constitutional: She appears well-developed. No distress.  HENT:  Head: Normocephalic.  Right Ear: External ear normal.  Left Ear: External ear normal.  Nose: Nose normal.  Mouth/Throat: Oropharynx is clear and moist.  Eyes: Pupils are equal, round, and reactive to light. Conjunctivae are normal. Right eye exhibits no discharge. Left eye exhibits no discharge.  Neck: Normal range of motion. Neck supple.  No JVD present. No tracheal deviation present. No thyromegaly present.  Cardiovascular: Normal rate, regular rhythm and normal heart sounds.  Pulmonary/Chest: No stridor. No respiratory distress. She has no wheezes.  Abdominal: Soft. Bowel sounds are normal. She exhibits no distension and no mass. There is no tenderness. There is no rebound and no guarding.  Musculoskeletal: She exhibits tenderness. She exhibits no edema.  Lymphadenopathy:    She has no cervical adenopathy.  Neurological: She displays normal reflexes. No cranial nerve deficit. She exhibits normal muscle tone. Coordination normal.  Skin: No rash noted. No erythema.   Psychiatric: She has a normal mood and affect. Her behavior is normal. Judgment and thought content normal.    Lab Results  Component Value Date   WBC 7.7 03/28/2017   HGB 13.9 03/28/2017   HCT 42.0 03/28/2017   PLT 219.0 03/28/2017   GLUCOSE 103 (H) 08/30/2017   CHOL 192 03/28/2017   TRIG 106.0 03/28/2017   HDL 41.60 03/28/2017   LDLDIRECT 164.6 01/05/2008   LDLCALC 129 (H) 03/28/2017   ALT 13 03/28/2017   AST 12 03/28/2017   NA 139 08/30/2017   K 3.6 08/30/2017   CL 106 08/30/2017   CREATININE 0.90 08/30/2017   BUN 9 08/30/2017   CO2 23 08/30/2017   TSH 0.74 03/28/2017   INR 0.96 11/22/2016   HGBA1C 5.6 03/02/2014    Ct Angio Chest W/cm &/or Wo Cm  Result Date: 08/30/2017 CLINICAL DATA:  62 year old female with acute shortness of breath cough and tachycardia. Elevated D-dimer. EXAM: CT ANGIOGRAPHY CHEST WITH CONTRAST TECHNIQUE: Multidetector CT imaging of the chest was performed using the standard protocol during bolus administration of intravenous contrast. Multiplanar CT image reconstructions and MIPs were obtained to evaluate the vascular anatomy. CONTRAST:  129mL ISOVUE-370 IOPAMIDOL (ISOVUE-370) INJECTION 76% COMPARISON:  08/29/2017 chest radiograph, 12/30/2008 chest CT and prior studies. FINDINGS: Cardiovascular: This is a technically adequate study is but respiratory motion artifact in some portions of the lungs slightly decreases sensitivity. No pulmonary emboli are identified. Heart size normal. A new small pericardial effusion is noted. Aortic atherosclerotic calcifications noted without aneurysm. Mediastinum/Nodes: UPPER limits of normal sized mediastinal and hilar lymph nodes are unchanged from 2010. No mediastinal mass. Visualized thyroid and esophagus are unremarkable. Lungs/Pleura: Mild dependent/basilar atelectasis/interstitial prominence again noted. There is no evidence of airspace disease, consolidation, mass, nodule, pleural effusion or pneumothorax. Upper  Abdomen: No acute abnormality. Hypodense LEFT hepatic lesions again noted. Musculoskeletal: No chest wall abnormality. No acute or significant osseous findings. Review of the MIP images confirms the above findings. IMPRESSION: 1. Small pericardial effusion of uncertain chronicity but new since 2010. 2. No evidence of pulmonary emboli or thoracic aortic aneurysm. 3. Mild dependent and basilar atelectasis/interstitial prominence again noted. 4.  Aortic Atherosclerosis (ICD10-I70.0). Electronically Signed   By: Margarette Canada M.D.   On: 08/30/2017 12:12    Assessment & Plan:   There are no diagnoses linked to this encounter. I have discontinued Shoshanna Mcquitty. Courtney "CHRISTY"'s azithromycin and oseltamivir. I am also having her maintain her loratadine, acetaminophen, (Menthol, Topical Analgesic, (BLUE-EMU MAXIMUM STRENGTH EX)), medroxyPROGESTERone, estradiol, aspirin EC, celecoxib, ALPRAZolam, Cholecalciferol (VITAMIN D3 PO), Fish Oil, Cyanocobalamin (VITAMIN B 12 PO), and irbesartan.  No orders of the defined types were placed in this encounter.    Follow-up: No follow-ups on file.  Walker Kehr, MD

## 2017-10-02 NOTE — Assessment & Plan Note (Signed)
On B12 

## 2017-10-04 ENCOUNTER — Ambulatory Visit: Payer: BLUE CROSS/BLUE SHIELD | Admitting: Internal Medicine

## 2017-11-11 ENCOUNTER — Ambulatory Visit: Payer: BLUE CROSS/BLUE SHIELD | Admitting: Family

## 2017-11-11 ENCOUNTER — Encounter: Payer: Self-pay | Admitting: Family

## 2017-11-11 VITALS — BP 138/82 | HR 100 | Temp 99.2°F | Ht 70.0 in

## 2017-11-11 DIAGNOSIS — J019 Acute sinusitis, unspecified: Secondary | ICD-10-CM | POA: Diagnosis not present

## 2017-11-11 MED ORDER — AZITHROMYCIN 250 MG PO TABS: ORAL_TABLET | ORAL | 0 refills | Status: DC

## 2017-11-11 MED ORDER — PROMETHAZINE-CODEINE 6.25-10 MG/5ML PO SYRP: 5.0000 mL | ORAL_SOLUTION | ORAL | 0 refills | Status: AC | PRN

## 2017-11-11 NOTE — Progress Notes (Signed)
Courtney Nielsen is a 62 y.o. female with the following history as recorded in [employer]:  Patient Active Problem List   Diagnosis Date Noted  . Tachycardia 08/29/2017  . Periscapular pain 08/29/2017  . Primary osteoarthritis of right knee 12/04/2016  . OA (osteoarthritis) of knee 12/03/2016  . LLQ abdominal pain 03/15/2016  . Lateral meniscal tear 10/19/2015  . Left hip pain 07/22/2013  . Occipital neuralgia 06/04/2013  . Leg pain 02/22/2012  . Gout 02/22/2012  . Weight gain 01/11/2012  . Edema leg 06/07/2011  . Pruritus 04/23/2011  . Well adult exam 04/23/2011  . Pneumonia, organism unspecified(486) 04/21/2011  . Cough 04/19/2011  . Paresthesia 09/26/2010  . NECK PAIN 08/22/2010  . BACK PAIN, THORACIC REGION 08/22/2010  . Low back pain 08/22/2010  . DIZZINESS 08/22/2010  . Cerumen impaction 07/21/2010  . CRAMP OF LIMB 03/16/2010  . BRONCHITIS, ACUTE 07/22/2009  . COPD 07/22/2009  . MEDIASTINAL LYMPHADENOPATHY 08/16/2008  . Nonspecific (abnormal) findings on radiological and other examination of body structure 08/16/2008  . CT, CHEST, ABNORMAL 08/16/2008  . CHEST PAIN 06/24/2008  . BRADYCARDIA 06/23/2008  . GERD 06/23/2008  . SYNCOPE 06/23/2008  . B12 deficiency 04/09/2008  . Pain in Soft Tissues of Limb 01/08/2008  . RASH AND OTHER NONSPECIFIC SKIN ERUPTION 01/08/2008  . SINUSITIS, ACUTE 10/31/2007  . HIP PAIN 10/31/2007  . TOBACCO USE DISORDER/SMOKER-SMOKING CESSATION DISCUSSED 04/21/2007  . Essential hypertension 04/21/2007  . MENOPAUSAL SYNDROME 04/21/2007  . ALLERGIC RHINITIS 04/19/2007  . COLONIC POLYPS, HX OF 04/19/2007    Current Outpatient Medications  Medication Sig Dispense Refill  . acetaminophen (TYLENOL) 500 MG tablet Take 1,000 mg by mouth every 6 (six) hours as needed for moderate pain.    Marland Kitchen ALPRAZolam (XANAX) 0.5 MG tablet TAKE ONE TABLET BY MOUTH AT BEDTIME AS NEEDED FOR ANXIETY. 30 tablet 5  . aspirin EC 81 MG tablet Take 81 mg by mouth daily.     . celecoxib (CELEBREX) 200 MG capsule Take 1 capsule (200 mg total) by mouth 2 (two) times daily. 60 capsule 5  . Cholecalciferol (VITAMIN D3 PO) Take 50 mcg by mouth daily.    . Cyanocobalamin (VITAMIN B 12 PO) Take 1,000 mcg by mouth daily.    Marland Kitchen estradiol (ESTRACE) 0.5 MG tablet estradiol 0.5 mg tablet  TAKE 1 TABLET BY MOUTH DAILY    . irbesartan (AVAPRO) 300 MG tablet Take 1 tablet (300 mg total) daily by mouth. 90 tablet 3  . loratadine (CLARITIN) 10 MG tablet Take 10 mg by mouth at bedtime.    . medroxyPROGESTERone (PROVERA) 2.5 MG tablet medroxyprogesterone 2.5 mg tablet  TAKE 1 T BY MOUTH DAILY    . Menthol, Topical Analgesic, (BLUE-EMU MAXIMUM STRENGTH EX) Apply 1 application topically daily.     . Omega-3 Fatty Acids (FISH OIL) 500 MG CAPS Take 1 capsule by mouth daily.    Marland Kitchen azithromycin (ZITHROMAX) 250 MG tablet 2 tabs po qd x 1 day; 1 tablet per day x 4 days; 6 tablet 0  . promethazine-codeine (PHENERGAN WITH CODEINE) 6.25-10 MG/5ML syrup Take 5 mLs by mouth every 4 (four) hours as needed for up to 10 days for cough. 75 mL 0   No current facility-administered medications for this visit.     Allergies: Meloxicam; Dilaudid [hydromorphone hcl]; Levaquin [levofloxacin]; Ceftin [cefuroxime axetil]; Coreg [carvedilol]; Furosemide; Prednisone; Sulfonamide derivatives; and Verapamil  Past Medical History:  Diagnosis Date  . Allergic rhinitis   . Allergy   . Arthritis   .  Cataract    right eye removed with lens implant   . Chest pain   . Colonic polyp   . COPD (chronic obstructive pulmonary disease) ([place])    patient denies at preop on ;   . Dysrhythmia    hx of every third beat is " off" per patient   . Hypertension   . Pneumonia    hx of 2014   . Smoker   . Vitamin B12 deficiency     Past Surgical History:  Procedure Laterality Date  . CARDIAC CATHETERIZATION     cath negative per patient   . CATARACT EXTRACTION W/PHACO Right 06/11/2016   Procedure: CATARACT EXTRACTION  PHACO AND INTRAOCULAR LENS PLACEMENT RIGHT EYE CDE=6.15;  Surgeon: Tonny Branch, MD;  Location: [facility];  Service: Ophthalmology;  Laterality: Right;  right  . COLONOSCOPY    . KNEE ARTHROSCOPY Right 10/19/2015   Procedure: RIGHT KNEE SCOPE WITH MENISCAL DEBRIDEMENT,LATERAL SYNOVECTOMY;  Surgeon: Gaynelle Arabian, MD;  Location: [facility];  Service: Orthopedics;  Laterality: Right;  . POLYPECTOMY    . TOTAL KNEE ARTHROPLASTY Right 12/03/2016   Procedure: RIGHT TOTAL KNEE ARTHROPLASTY;  Surgeon: Gaynelle Arabian, MD;  Location: [facility];  Service: Orthopedics;  Laterality: Right;    Family History  Problem Relation Age of Onset  . Arthritis Mother   . Heart disease Mother   . COPD Father   . Cancer Father        Throat and stomach  . Arthritis Father   . Lung cancer Father   . Lymphoma Father   . Esophageal cancer Father   . Asthma Brother   . Emphysema Paternal Grandfather   . Hypertension Other   . Diabetes Maternal Aunt   . Colon cancer Neg Hx   . Colon polyps Neg Hx   . Rectal cancer Neg Hx   . Stomach cancer Neg Hx     Social History   Tobacco Use  . Smoking status: Former Smoker    Packs/day: 1.00    Years: 20.00    Pack years: 20.00    Types: Cigarettes    Last attempt to quit: 07/03/2011    Years since quitting: 6.3  . Smokeless tobacco: Never Used  Substance Use Topics  . Alcohol use: No    Subjective:  Presents with concerns for possible sinus infection; denies any chest pain or shortness of breath; + productive cough; + hoarseness; + right ear pain; symptoms started last week; uses OTC Claritin 10 mg; does not like to use nasal sprays; requesting refill on Codeine cough syrup she has used in the past;   Objective:  Vitals:   11/11/17 1121  BP: 138/82  Pulse: 100  Temp: 99.2 F (37.3 C)  TempSrc: Oral  SpO2: 97%  Height: 5\' 10"  (1.778 m)    General: Well developed, well nourished, in no acute distress  Skin : Warm and dry.  Head: Normocephalic and atraumatic  Ears:  External normal; canals clear; tympanic membranes congested bilaterally Oropharynx: Pink, supple. No suspicious lesions  Neck: Supple without thyromegaly, adenopathy  Lungs: Respirations unlabored; clear to auscultation bilaterally without wheeze, rales, rhonchi  CVS exam: normal rate and regular rhythm.  Neurologic: Alert and oriented; speech intact; face symmetrical; moves all extremities well; CNII-XII intact without focal deficit   Assessment:  1. Acute sinusitis, recurrence not specified, unspecified location     Plan:  Rx for Z-pak #1 take as directed; Rx for Phenergan/ Codeine cough syrup to use as night; increase  fluids, rest and follow-up worse, no better.   No follow-ups on file.  No orders of the defined types were placed in this encounter.   Requested Prescriptions   Signed Prescriptions Disp Refills  . promethazine-codeine (PHENERGAN WITH CODEINE) 6.25-10 MG/5ML syrup 75 mL 0    Sig: Take 5 mLs by mouth every 4 (four) hours as needed for up to 10 days for cough.  Marland Kitchen azithromycin (ZITHROMAX) 250 MG tablet 6 tablet 0    Sig: 2 tabs po qd x 1 day; 1 tablet per day x 4 days;

## 2017-12-09 IMAGING — DX DG CHEST 2V
2 series · 2 of 2 positions shown · non-contrast
Comparison: 04/19/2011.

CLINICAL DATA: Pre right knee replacement evaluation. Smoker. COPD.

EXAM:
CHEST  2 VIEW

[chest pa]
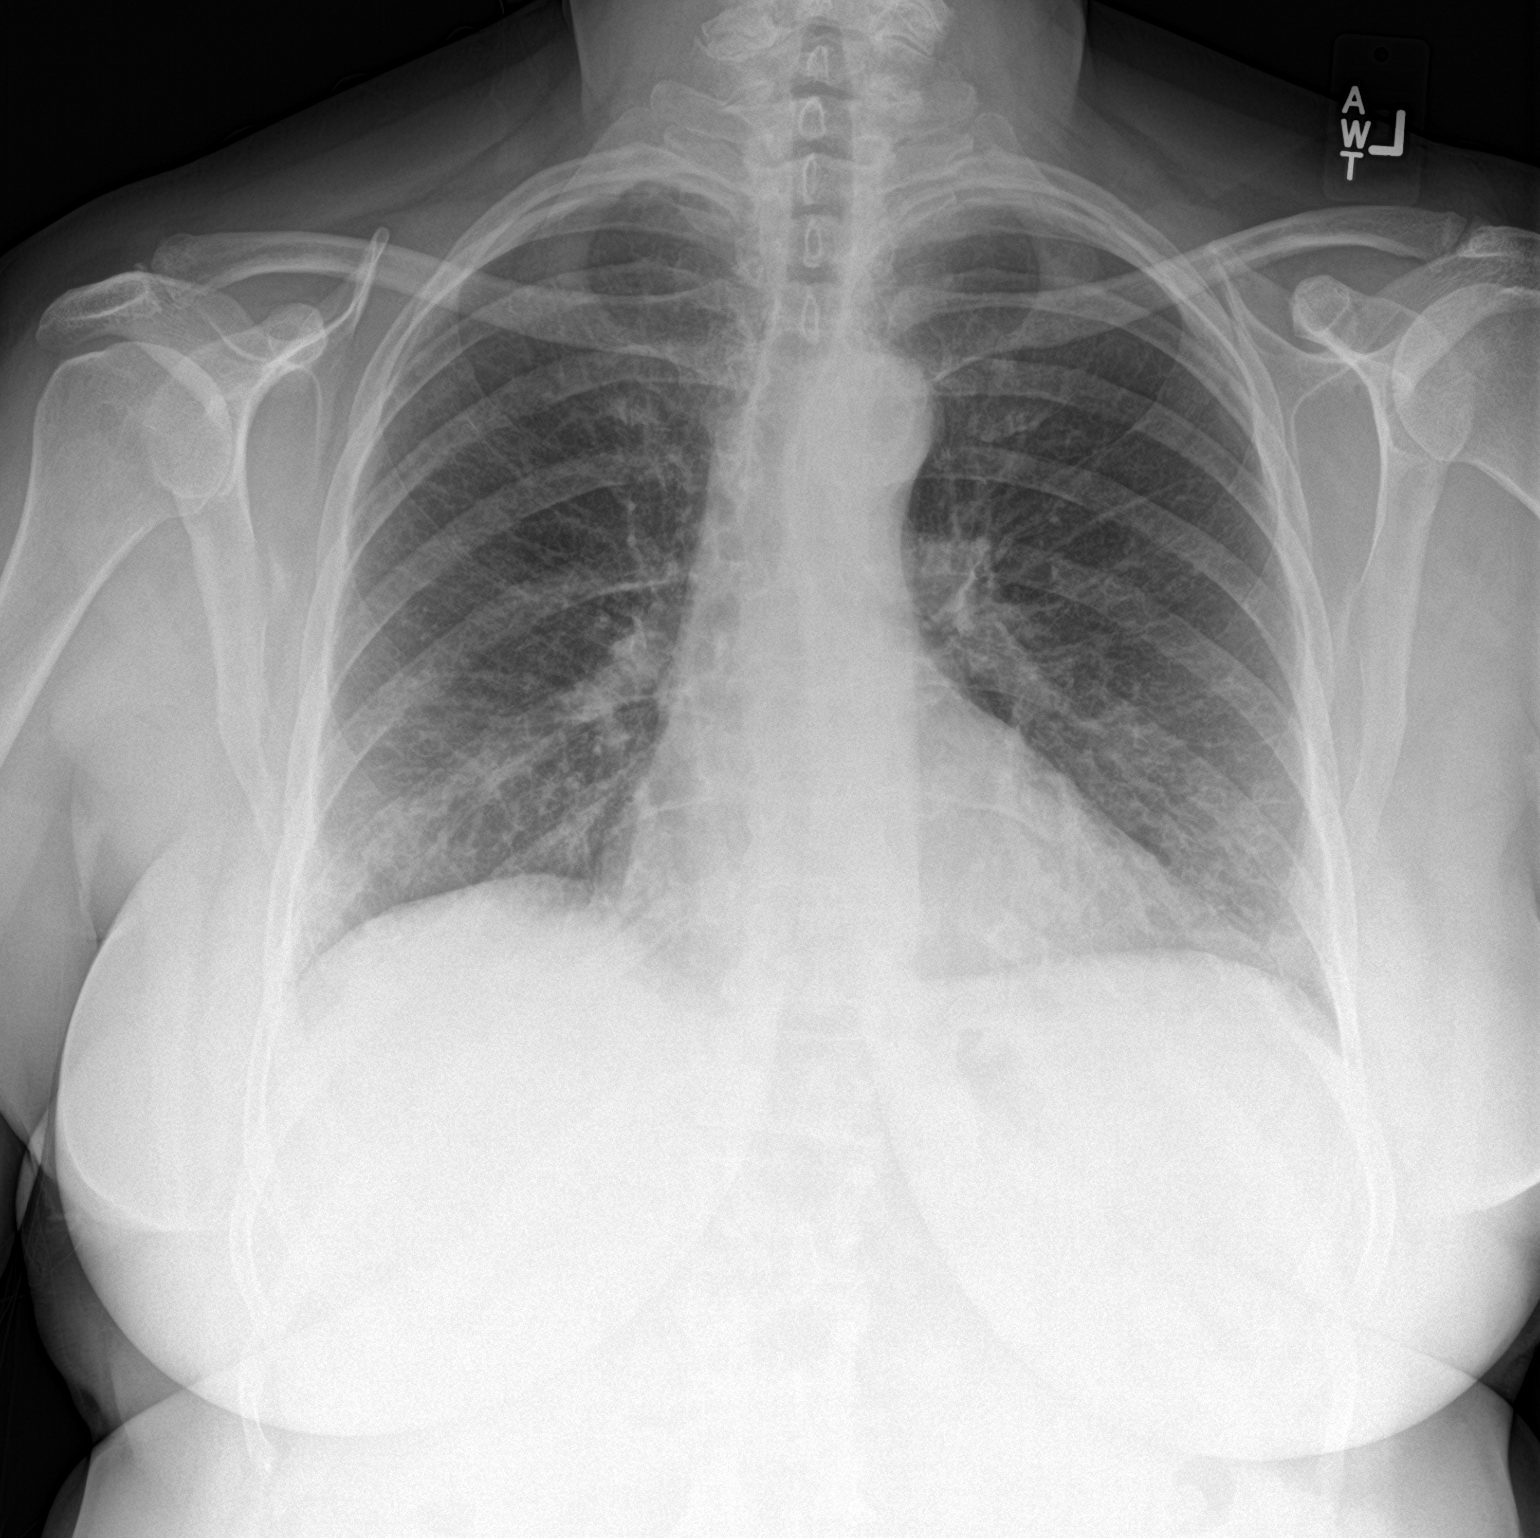

[chest lat]
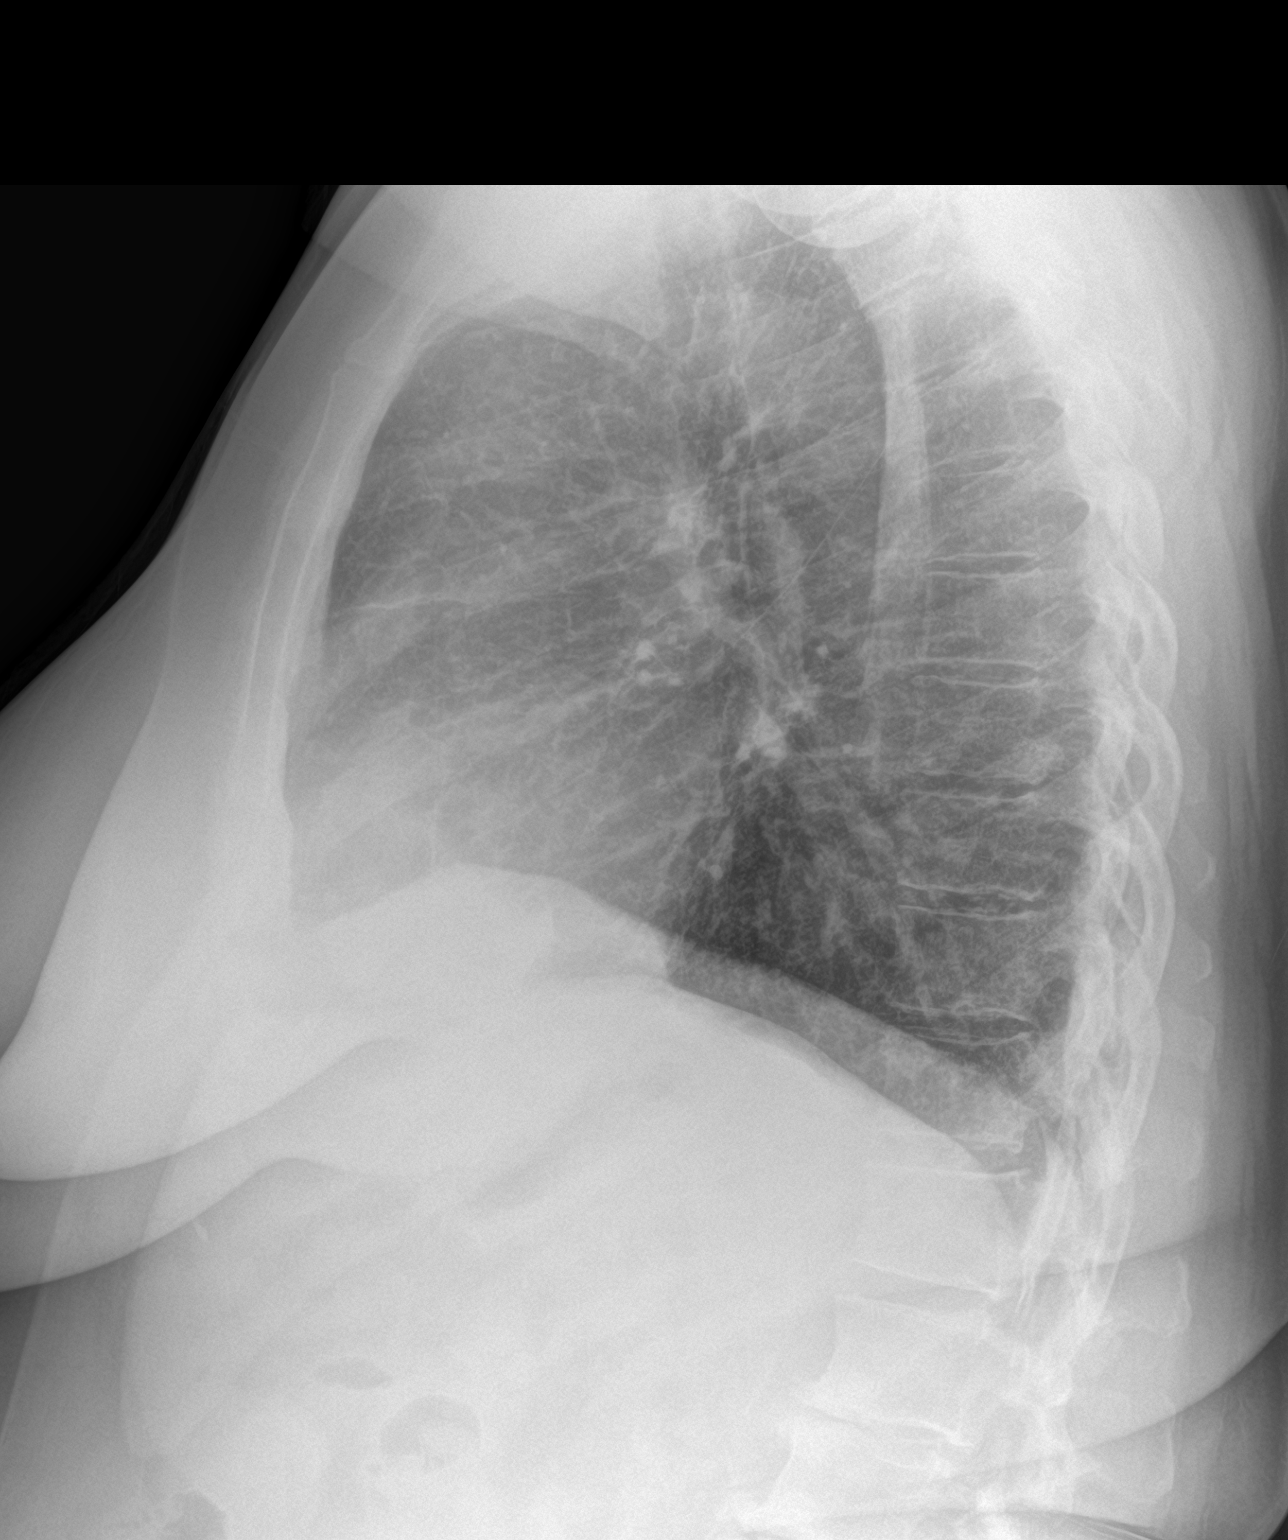

[2 of 2 positions shown; findings below may reference images not displayed]

FINDINGS: Normal sized heart. Clear lungs. The lungs are mildly hyperexpanded
with mild diffuse peribronchial thickening and accentuation of the
interstitial markings. Upper lumbar spine degenerative changes.
IMPRESSION: Mild changes of COPD and chronic bronchitis.  No acute abnormality.

## 2018-01-16 DIAGNOSIS — Z96651 Presence of right artificial knee joint: Secondary | ICD-10-CM | POA: Diagnosis not present

## 2018-01-16 DIAGNOSIS — M1711 Unilateral primary osteoarthritis, right knee: Secondary | ICD-10-CM | POA: Diagnosis not present

## 2018-01-16 DIAGNOSIS — Z471 Aftercare following joint replacement surgery: Secondary | ICD-10-CM | POA: Diagnosis not present

## 2018-01-20 ENCOUNTER — Telehealth: Payer: Self-pay | Admitting: Internal Medicine

## 2018-01-20 DIAGNOSIS — Z0000 Encounter for general adult medical examination without abnormal findings: Secondary | ICD-10-CM

## 2018-01-20 NOTE — Telephone Encounter (Signed)
Copied from [place] [id number]. Topic: Quick Communication - See Telephone Encounter >> Jan 20, 2018 11:24 AM Robina Ade, Helene Kelp D wrote: CRM for notification. See Telephone encounter for: 01/20/18. Patient has an appt on 04/08/18 for her CPE and would like her labs done a week before appt due to see needs  form filed out the day of her CPE. Please put in a order for pt and patient would like a call back regarding this.

## 2018-01-21 NOTE — Telephone Encounter (Signed)
Called pt no answer LMOM MD has place lab orders to have done prior to cpx.Marland KitchenJohny Chess

## 2018-01-21 NOTE — Telephone Encounter (Signed)
Done. Thx.

## 2018-04-02 ENCOUNTER — Other Ambulatory Visit (INDEPENDENT_AMBULATORY_CARE_PROVIDER_SITE_OTHER): Payer: BLUE CROSS/BLUE SHIELD

## 2018-04-02 DIAGNOSIS — Z0000 Encounter for general adult medical examination without abnormal findings: Secondary | ICD-10-CM | POA: Diagnosis not present

## 2018-04-02 LAB — BASIC METABOLIC PANEL
BUN: 18 mg/dL (ref 6–23)
CO2: 26 mEq/L (ref 19–32)
Calcium: 8.8 mg/dL (ref 8.4–10.5)
Chloride: 108 mEq/L (ref 96–112)
Creatinine, Ser: 1.07 mg/dL (ref 0.40–1.20)
GFR: 55.22 mL/min — AB (ref >60.00)
GLUCOSE: 91 mg/dL (ref 70–99)
POTASSIUM: 3.9 meq/L (ref 3.5–5.1)
Sodium: 141 mEq/L (ref 135–145)

## 2018-04-02 LAB — URINALYSIS
BILIRUBIN URINE: NEGATIVE
KETONES UR: NEGATIVE
Leukocytes, UA: NEGATIVE
Nitrite: NEGATIVE
PH: 6 (ref 5.0–8.0)
SPECIFIC GRAVITY, URINE: 1.025 (ref 1.000–1.030)
URINE GLUCOSE: NEGATIVE
Urobilinogen, UA: 0.2 (ref 0.0–1.0)

## 2018-04-02 LAB — HEPATIC FUNCTION PANEL
ALT: 15 U/L (ref 0–35)
AST: 13 U/L (ref 0–37)
Albumin: 3.9 g/dL (ref 3.5–5.2)
Alkaline Phosphatase: 76 U/L (ref 39–117)
BILIRUBIN DIRECT: 0.1 mg/dL (ref 0.0–0.3)
BILIRUBIN TOTAL: 0.9 mg/dL (ref 0.2–1.2)
Total Protein: 6.7 g/dL (ref 6.0–8.3)

## 2018-04-02 LAB — CBC WITH DIFFERENTIAL/PLATELET
BASOS PCT: 2 % (ref 0.0–3.0)
Basophils Absolute: 0.1 10*3/uL (ref 0.0–0.1)
EOS PCT: 4.9 % (ref 0.0–5.0)
Eosinophils Absolute: 0.4 10*3/uL (ref 0.0–0.7)
HCT: 41.1 % (ref 36.0–46.0)
Hemoglobin: 14 g/dL (ref 12.0–15.0)
LYMPHS ABS: 2.6 10*3/uL (ref 0.7–4.0)
Lymphocytes Relative: 36.4 % (ref 12.0–46.0)
MCHC: 34 g/dL (ref 30.0–36.0)
MCV: 92.6 fl (ref 78.0–100.0)
Monocytes Absolute: 0.5 10*3/uL (ref 0.1–1.0)
Monocytes Relative: 6.4 % (ref 3.0–12.0)
NEUTROS ABS: 3.7 10*3/uL (ref 1.4–7.7)
NEUTROS PCT: 50.3 % (ref 43.0–77.0)
PLATELETS: 236 10*3/uL (ref 150.0–400.0)
RBC: 4.44 Mil/uL (ref 3.87–5.11)
RDW: 13.5 % (ref 11.5–15.5)
WBC: 7.3 10*3/uL (ref 4.0–10.5)

## 2018-04-02 LAB — TSH: TSH: 0.72 u[IU]/mL (ref 0.35–4.50)

## 2018-04-02 LAB — LIPID PANEL
CHOL/HDL RATIO: 4
Cholesterol: 183 mg/dL (ref 0–200)
HDL: 42 mg/dL (ref >39.00)
LDL CALC: 125 mg/dL — AB (ref 0–99)
NONHDL: 140.79
Triglycerides: 77 mg/dL (ref 0.0–149.0)
VLDL: 15.4 mg/dL (ref 0.0–40.0)

## 2018-04-04 ENCOUNTER — Encounter: Payer: BLUE CROSS/BLUE SHIELD | Admitting: Internal Medicine

## 2018-04-08 ENCOUNTER — Encounter: Payer: Self-pay | Admitting: Internal Medicine

## 2018-04-08 ENCOUNTER — Ambulatory Visit (INDEPENDENT_AMBULATORY_CARE_PROVIDER_SITE_OTHER): Payer: BLUE CROSS/BLUE SHIELD | Admitting: Internal Medicine

## 2018-04-08 VITALS — BP 134/86 | HR 74 | Temp 98.1°F | Ht 70.0 in | Wt 242.0 lb

## 2018-04-08 DIAGNOSIS — M545 Low back pain, unspecified: Secondary | ICD-10-CM

## 2018-04-08 DIAGNOSIS — E538 Deficiency of other specified B group vitamins: Secondary | ICD-10-CM | POA: Diagnosis not present

## 2018-04-08 DIAGNOSIS — I10 Essential (primary) hypertension: Secondary | ICD-10-CM | POA: Diagnosis not present

## 2018-04-08 DIAGNOSIS — Z23 Encounter for immunization: Secondary | ICD-10-CM | POA: Diagnosis not present

## 2018-04-08 DIAGNOSIS — Z0000 Encounter for general adult medical examination without abnormal findings: Secondary | ICD-10-CM | POA: Diagnosis not present

## 2018-04-08 MED ORDER — TRAMADOL-ACETAMINOPHEN 37.5-325 MG PO TABS: ORAL_TABLET | ORAL | 0 refills | Status: DC

## 2018-04-08 MED ORDER — ALPRAZOLAM 0.5 MG PO TABS: ORAL_TABLET | ORAL | 5 refills | Status: DC

## 2018-04-08 MED ORDER — IRBESARTAN 300 MG PO TABS: 300.0000 mg | ORAL_TABLET | Freq: Every day | ORAL | 3 refills | Status: DC

## 2018-04-08 NOTE — Progress Notes (Signed)
Subjective:  [name], female    DOB: [birth year]  Age: 62 y.o. MRN: [id number]  CC: No chief complaint on file.   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 acetaminophen (TYLENOL) 500 MG tablet Take 1,000 mg by mouth every 6 (six) hours as needed for moderate pain.    Marland Kitchen ALPRAZolam (XANAX) 0.5 MG tablet TAKE ONE TABLET BY MOUTH AT BEDTIME AS NEEDED FOR ANXIETY. 30 tablet 5  . aspirin EC 81 MG tablet Take 81 mg by mouth daily.    . celecoxib (CELEBREX) 200 MG capsule Take 1 capsule (200 mg total) by mouth 2 (two) times daily. 60 capsule 5  . Cholecalciferol (VITAMIN D3 PO) Take 50 mcg by mouth daily.    . Cyanocobalamin (VITAMIN B 12 PO) Take 1,000 mcg by mouth daily.    Marland Kitchen estradiol (ESTRACE) 0.5 MG tablet estradiol 0.5 mg tablet  TAKE 1 TABLET BY MOUTH DAILY    . irbesartan (AVAPRO) 300 MG tablet Take 1 tablet (300 mg total) daily by mouth. 90 tablet 3  . loratadine (CLARITIN) 10 MG tablet Take 10 mg by mouth at bedtime.    . medroxyPROGESTERone (PROVERA) 2.5 MG tablet medroxyprogesterone 2.5 mg tablet  TAKE 1 T BY MOUTH DAILY    . Menthol, Topical Analgesic, (BLUE-EMU MAXIMUM STRENGTH EX) Apply 1 application topically daily.     . Omega-3 Fatty Acids (FISH OIL) 500 MG CAPS Take 1 capsule by mouth daily.    Marland Kitchen azithromycin (ZITHROMAX) 250 MG tablet 2 tabs po qd x 1 day; 1 tablet per day x 4 days; 6 tablet 0   No facility-administered medications prior to visit.     ROS: Review of Systems  Constitutional: Negative for activity change, appetite change, chills, fatigue and unexpected weight change.  HENT: Negative for congestion, mouth sores and sinus pressure.   Eyes: Negative for visual disturbance.  Respiratory: Negative for cough and chest tightness.   Gastrointestinal: Negative for abdominal pain and nausea.  Genitourinary: Negative for difficulty urinating, frequency and vaginal  pain.  Musculoskeletal: Negative for back pain and gait problem.  Skin: Negative for pallor and rash.  Neurological: Negative for dizziness, tremors, weakness, numbness and headaches.  Psychiatric/Behavioral: Negative for confusion, sleep disturbance and suicidal ideas.    Objective:  BP 134/86 (BP Location: Left Arm, Patient Position: Sitting, Cuff Size: Large)   Pulse 74   Temp 98.1 F (36.7 C) (Oral)   Ht 5\' 10"  (1.778 m)   Wt 242 lb (109.8 kg)   SpO2 99%   BMI 34.72 kg/m   BP Readings from Last 3 Encounters:  04/08/18 134/86  11/11/17 138/82  10/02/17 132/84    Wt Readings from Last 3 Encounters:  04/08/18 242 lb (109.8 kg)  10/02/17 234 lb (106.1 kg)  08/29/17 234 lb (106.1 kg)    Physical Exam  Constitutional: She appears well-developed. No distress.  HENT:  Head: Normocephalic.  Right Ear: External ear normal.  Left Ear: External ear normal.  Nose: Nose normal.  Mouth/Throat: Oropharynx is clear and moist.  Eyes: Pupils are equal, round, and reactive to light. Conjunctivae are normal. Right eye exhibits no discharge. Left eye exhibits no discharge.  Neck: Normal range of motion. Neck supple. No JVD present. No tracheal deviation present. No thyromegaly present.  Cardiovascular: Normal rate, regular rhythm and normal heart sounds.  Pulmonary/Chest: No stridor. No respiratory distress. She has  no wheezes.  Abdominal: Soft. Bowel sounds are normal. She exhibits no distension and no mass. There is no tenderness. There is no rebound and no guarding.  Musculoskeletal: She exhibits tenderness. She exhibits no edema.  Lymphadenopathy:    She has no cervical adenopathy.  Neurological: She displays normal reflexes. No cranial nerve deficit. She exhibits normal muscle tone. Coordination normal.  Skin: No rash noted. No erythema.  Psychiatric: She has a normal mood and affect. Her behavior is normal. Judgment and thought content normal.   Obese LS, knees tender   Lab  Results  Component Value Date   WBC 7.3 04/02/2018   HGB 14.0 04/02/2018   HCT 41.1 04/02/2018   PLT 236.0 04/02/2018   GLUCOSE 91 04/02/2018   CHOL 183 04/02/2018   TRIG 77.0 04/02/2018   HDL 42.00 04/02/2018   LDLDIRECT 164.6 01/05/2008   LDLCALC 125 (H) 04/02/2018   ALT 15 04/02/2018   AST 13 04/02/2018   NA 141 04/02/2018   K 3.9 04/02/2018   CL 108 04/02/2018   CREATININE 1.07 04/02/2018   BUN 18 04/02/2018   CO2 26 04/02/2018   TSH 0.72 04/02/2018   INR 0.96 11/22/2016   HGBA1C 5.6 03/02/2014    Ct Angio Chest W/cm &/or Wo Cm  Result Date: 08/30/2017 CLINICAL DATA:  62 year old female with acute shortness of breath cough and tachycardia. Elevated D-dimer. EXAM: CT ANGIOGRAPHY CHEST WITH CONTRAST TECHNIQUE: Multidetector CT imaging of the chest was performed using the standard protocol during bolus administration of intravenous contrast. Multiplanar CT image reconstructions and MIPs were obtained to evaluate the vascular anatomy. CONTRAST:  127mL ISOVUE-370 IOPAMIDOL (ISOVUE-370) INJECTION 76% COMPARISON:  08/29/2017 chest radiograph, 12/30/2008 chest CT and prior studies. FINDINGS: Cardiovascular: This is a technically adequate study is but respiratory motion artifact in some portions of the lungs slightly decreases sensitivity. No pulmonary emboli are identified. Heart size normal. A new small pericardial effusion is noted. Aortic atherosclerotic calcifications noted without aneurysm. Mediastinum/Nodes: UPPER limits of normal sized mediastinal and hilar lymph nodes are unchanged from 2010. No mediastinal mass. Visualized thyroid and esophagus are unremarkable. Lungs/Pleura: Mild dependent/basilar atelectasis/interstitial prominence again noted. There is no evidence of airspace disease, consolidation, mass, nodule, pleural effusion or pneumothorax. Upper Abdomen: No acute abnormality. Hypodense LEFT hepatic lesions again noted. Musculoskeletal: No chest wall abnormality. No acute or  significant osseous findings. Review of the MIP images confirms the above findings. IMPRESSION: 1. Small pericardial effusion of uncertain chronicity but new since 2010. 2. No evidence of pulmonary emboli or thoracic aortic aneurysm. 3. Mild dependent and basilar atelectasis/interstitial prominence again noted. 4.  Aortic Atherosclerosis (ICD10-I70.0). Electronically Signed   By: Margarette Canada M.D.   On: 08/30/2017 12:12    Assessment & Plan:   There are no diagnoses linked to this encounter.   No orders of the defined types were placed in this encounter.    Follow-up: No follow-ups on file.  Walker Kehr, MD

## 2018-04-08 NOTE — Assessment & Plan Note (Deleted)
s/p R TKR surgery planned for 12/03/16, assuming her pre-op routine labs and EKG are acceptable. Thank you!

## 2018-04-08 NOTE — Assessment & Plan Note (Signed)
On B12 

## 2018-04-08 NOTE — Assessment & Plan Note (Signed)
Losartan 

## 2018-04-08 NOTE — Assessment & Plan Note (Signed)
Will try Ultracet prn  Potential benefits of a long term opioids use as well as potential risks (i.e. addiction risk, apnea etc) and complications (i.e. Somnolence, constipation and others) were explained to the patient and were aknowledged.

## 2018-04-08 NOTE — Assessment & Plan Note (Addendum)
We discussed age appropriate health related issues, including available/recomended screening tests and vaccinations. We discussed a need for adhering to healthy diet and exercise. Labs were ordered to be later reviewed . All questions were answered. Flu shot Colon 2018 Mammo - pending

## 2018-04-08 NOTE — Addendum Note (Signed)
Addended by: Karren Cobble on: 04/08/2018 09:22 AM   Modules accepted: Orders

## 2018-04-18 DIAGNOSIS — M545 Low back pain: Secondary | ICD-10-CM | POA: Diagnosis not present

## 2018-04-18 DIAGNOSIS — Z6834 Body mass index (BMI) 34.0-34.9, adult: Secondary | ICD-10-CM | POA: Diagnosis not present

## 2018-04-18 DIAGNOSIS — I10 Essential (primary) hypertension: Secondary | ICD-10-CM | POA: Diagnosis not present

## 2018-05-06 DIAGNOSIS — Z6834 Body mass index (BMI) 34.0-34.9, adult: Secondary | ICD-10-CM | POA: Diagnosis not present

## 2018-05-06 DIAGNOSIS — Z1231 Encounter for screening mammogram for malignant neoplasm of breast: Secondary | ICD-10-CM | POA: Diagnosis not present

## 2018-05-06 DIAGNOSIS — Z01419 Encounter for gynecological examination (general) (routine) without abnormal findings: Secondary | ICD-10-CM | POA: Diagnosis not present

## 2018-05-21 ENCOUNTER — Other Ambulatory Visit: Payer: Self-pay | Admitting: Internal Medicine

## 2018-05-21 DIAGNOSIS — M47816 Spondylosis without myelopathy or radiculopathy, lumbar region: Secondary | ICD-10-CM | POA: Diagnosis not present

## 2018-05-21 MED ORDER — TRAMADOL-ACETAMINOPHEN 37.5-325 MG PO TABS: ORAL_TABLET | ORAL | 2 refills | Status: DC

## 2018-06-04 DIAGNOSIS — M1612 Unilateral primary osteoarthritis, left hip: Secondary | ICD-10-CM | POA: Diagnosis not present

## 2018-06-06 ENCOUNTER — Other Ambulatory Visit: Payer: Self-pay | Admitting: Internal Medicine

## 2018-06-06 MED ORDER — AZITHROMYCIN 250 MG PO TABS: ORAL_TABLET | ORAL | 0 refills | Status: DC

## 2018-06-06 NOTE — Progress Notes (Signed)
URI/sinusitis

## 2018-06-18 DIAGNOSIS — M1612 Unilateral primary osteoarthritis, left hip: Secondary | ICD-10-CM | POA: Diagnosis not present

## 2018-07-01 ENCOUNTER — Ambulatory Visit: Payer: BLUE CROSS/BLUE SHIELD | Admitting: Internal Medicine

## 2018-07-01 ENCOUNTER — Encounter: Payer: Self-pay | Admitting: Internal Medicine

## 2018-07-01 VITALS — BP 128/82 | HR 80 | Temp 98.0°F | Ht 70.0 in | Wt 234.0 lb

## 2018-07-01 DIAGNOSIS — M545 Low back pain, unspecified: Secondary | ICD-10-CM

## 2018-07-01 DIAGNOSIS — E538 Deficiency of other specified B group vitamins: Secondary | ICD-10-CM

## 2018-07-01 DIAGNOSIS — I10 Essential (primary) hypertension: Secondary | ICD-10-CM

## 2018-07-01 DIAGNOSIS — J209 Acute bronchitis, unspecified: Secondary | ICD-10-CM | POA: Diagnosis not present

## 2018-07-01 MED ORDER — AZITHROMYCIN 250 MG PO TABS: ORAL_TABLET | ORAL | 0 refills | Status: DC

## 2018-07-01 NOTE — Assessment & Plan Note (Signed)
S/p injections - better

## 2018-07-01 NOTE — Assessment & Plan Note (Signed)
On B12 

## 2018-07-01 NOTE — Assessment & Plan Note (Signed)
Better Losartan 

## 2018-07-01 NOTE — Assessment & Plan Note (Signed)
Zpac if worse 

## 2018-07-01 NOTE — Progress Notes (Signed)
Subjective:  Patient ID: Courtney Nielsen, female    DOB: 1955-09-05  Age: 62 y.o. MRN: [id number]  CC: No chief complaint on file.   HPI Courtney Nielsen presents for a new URI sx's >1 wk. C/o fever, chills and cough.  F/u HTN F/u LBP - better w/injections  Outpatient Medications Prior to Visit  Medication Sig Dispense Refill  . acetaminophen (TYLENOL) 500 MG tablet Take 1,000 mg by mouth every 6 (six) hours as needed for moderate pain.    Marland Kitchen ALPRAZolam (XANAX) 0.5 MG tablet TAKE ONE TABLET BY MOUTH AT BEDTIME AS NEEDED FOR ANXIETY. 30 tablet 5  . aspirin EC 81 MG tablet Take 81 mg by mouth daily.    Marland Kitchen azithromycin (ZITHROMAX Z-PAK) 250 MG tablet As directed 6 tablet 0  . celecoxib (CELEBREX) 200 MG capsule Take 1 capsule (200 mg total) by mouth 2 (two) times daily. 60 capsule 5  . Cholecalciferol (VITAMIN D3 PO) Take 50 mcg by mouth daily.    . Cyanocobalamin (VITAMIN B 12 PO) Take 1,000 mcg by mouth daily.    Marland Kitchen estradiol (ESTRACE) 0.5 MG tablet estradiol 0.5 mg tablet  TAKE 1 TABLET BY MOUTH DAILY    . irbesartan (AVAPRO) 300 MG tablet Take 1 tablet (300 mg total) by mouth daily. 90 tablet 3  . loratadine (CLARITIN) 10 MG tablet Take 10 mg by mouth at bedtime.    . medroxyPROGESTERone (PROVERA) 2.5 MG tablet medroxyprogesterone 2.5 mg tablet  TAKE 1 T BY MOUTH DAILY    . Menthol, Topical Analgesic, (BLUE-EMU MAXIMUM STRENGTH EX) Apply 1 application topically daily.     . Omega-3 Fatty Acids (FISH OIL) 500 MG CAPS Take 1 capsule by mouth daily.    . traMADol-acetaminophen (ULTRACET) 37.5-325 MG tablet 1/2 or 1 tab po qid prn pain 100 tablet 2   No facility-administered medications prior to visit.     ROS: Review of Systems  Constitutional: Positive for chills, fatigue and fever. Negative for activity change, appetite change, diaphoresis and unexpected weight change.  HENT: Positive for congestion, postnasal drip and sinus pain. Negative for ear pain, facial swelling, hearing  loss, mouth sores, nosebleeds, rhinorrhea, sinus pressure, sneezing, sore throat, tinnitus and trouble swallowing.   Eyes: Negative for pain, discharge, redness, itching and visual disturbance.  Respiratory: Positive for cough and wheezing. Negative for chest tightness, shortness of breath and stridor.   Cardiovascular: Negative for chest pain, palpitations and leg swelling.  Gastrointestinal: Negative for abdominal distention, anal bleeding, blood in stool, constipation, diarrhea, nausea and rectal pain.  Genitourinary: Negative for difficulty urinating, dysuria, flank pain, frequency, genital sores, hematuria, pelvic pain, urgency, vaginal bleeding and vaginal discharge.  Musculoskeletal: Negative for arthralgias, back pain, gait problem, joint swelling, neck pain and neck stiffness.  Skin: Negative.  Negative for rash.  Neurological: Negative for dizziness, tremors, seizures, syncope, speech difficulty, weakness, numbness and headaches.  Hematological: Negative for adenopathy. Does not bruise/bleed easily.  Psychiatric/Behavioral: Negative for behavioral problems, decreased concentration, dysphoric mood, sleep disturbance and suicidal ideas. The patient is not nervous/anxious.     Objective:  BP 128/82 (BP Location: Left Arm, Patient Position: Sitting, Cuff Size: Large)   Pulse 80   Temp 98 F (36.7 C) (Oral)   Ht 5\' 10"  (1.778 m)   Wt 234 lb (106.1 kg)   SpO2 98%   BMI 33.58 kg/m   BP Readings from Last 3 Encounters:  07/01/18 128/82  04/08/18 134/86  11/11/17 138/82    Wt  Readings from Last 3 Encounters:  07/01/18 234 lb (106.1 kg)  04/08/18 242 lb (109.8 kg)  10/02/17 234 lb (106.1 kg)    Physical Exam Constitutional:      General: She is not in acute distress.    Appearance: She is well-developed.  HENT:     Head: Normocephalic.     Right Ear: External ear normal.     Left Ear: External ear normal.     Nose: Nose normal.  Eyes:     General:        Right eye: No  discharge.        Left eye: No discharge.     Conjunctiva/sclera: Conjunctivae normal.     Pupils: Pupils are equal, round, and reactive to light.  Neck:     Musculoskeletal: Normal range of motion and neck supple.     Thyroid: No thyromegaly.     Vascular: No JVD.     Trachea: No tracheal deviation.  Cardiovascular:     Rate and Rhythm: Normal rate and regular rhythm.     Heart sounds: Normal heart sounds.  Pulmonary:     Effort: No respiratory distress.     Breath sounds: No stridor. No wheezing.  Abdominal:     General: Bowel sounds are normal. There is no distension.     Palpations: Abdomen is soft. There is no mass.     Tenderness: There is no abdominal tenderness. There is no guarding or rebound.  Musculoskeletal:        General: No tenderness.  Lymphadenopathy:     Cervical: No cervical adenopathy.  Skin:    Findings: No erythema or rash.  Neurological:     Cranial Nerves: No cranial nerve deficit.     Motor: No abnormal muscle tone.     Coordination: Coordination normal.     Deep Tendon Reflexes: Reflexes normal.  Psychiatric:        Behavior: Behavior normal.        Thought Content: Thought content normal.        Judgment: Judgment normal.     Lab Results  Component Value Date   WBC 7.3 04/02/2018   HGB 14.0 04/02/2018   HCT 41.1 04/02/2018   PLT 236.0 04/02/2018   GLUCOSE 91 04/02/2018   CHOL 183 04/02/2018   TRIG 77.0 04/02/2018   HDL 42.00 04/02/2018   LDLDIRECT 164.6 01/05/2008   LDLCALC 125 (H) 04/02/2018   ALT 15 04/02/2018   AST 13 04/02/2018   NA 141 04/02/2018   K 3.9 04/02/2018   CL 108 04/02/2018   CREATININE 1.07 04/02/2018   BUN 18 04/02/2018   CO2 26 04/02/2018   TSH 0.72 04/02/2018   INR 0.96 11/22/2016   HGBA1C 5.6 03/02/2014    Ct Angio Chest W/cm &/or Wo Cm  Result Date: 08/30/2017 CLINICAL DATA:  62 year old female with acute shortness of breath cough and tachycardia. Elevated D-dimer. EXAM: CT ANGIOGRAPHY CHEST WITH CONTRAST  TECHNIQUE: Multidetector CT imaging of the chest was performed using the standard protocol during bolus administration of intravenous contrast. Multiplanar CT image reconstructions and MIPs were obtained to evaluate the vascular anatomy. CONTRAST:  111mL ISOVUE-370 IOPAMIDOL (ISOVUE-370) INJECTION 76% COMPARISON:  08/29/2017 chest radiograph, 12/30/2008 chest CT and prior studies. FINDINGS: Cardiovascular: This is a technically adequate study is but respiratory motion artifact in some portions of the lungs slightly decreases sensitivity. No pulmonary emboli are identified. Heart size normal. A new small pericardial effusion is noted. Aortic atherosclerotic calcifications noted  without aneurysm. Mediastinum/Nodes: UPPER limits of normal sized mediastinal and hilar lymph nodes are unchanged from 2010. No mediastinal mass. Visualized thyroid and esophagus are unremarkable. Lungs/Pleura: Mild dependent/basilar atelectasis/interstitial prominence again noted. There is no evidence of airspace disease, consolidation, mass, nodule, pleural effusion or pneumothorax. Upper Abdomen: No acute abnormality. Hypodense LEFT hepatic lesions again noted. Musculoskeletal: No chest wall abnormality. No acute or significant osseous findings. Review of the MIP images confirms the above findings. IMPRESSION: 1. Small pericardial effusion of uncertain chronicity but new since 2010. 2. No evidence of pulmonary emboli or thoracic aortic aneurysm. 3. Mild dependent and basilar atelectasis/interstitial prominence again noted. 4.  Aortic Atherosclerosis (ICD10-I70.0). Electronically Signed   By: Margarette Canada M.D.   On: 08/30/2017 12:12    Assessment & Plan:   There are no diagnoses linked to this encounter.   No orders of the defined types were placed in this encounter.    Follow-up: No follow-ups on file.  Walker Kehr, MD

## 2018-07-01 NOTE — Patient Instructions (Signed)
You can use over-the-counter  "cold" medicines  such as "Afrin" nasal spray for nasal congestion as directed. Use " Delsym" or" Robitussin" cough syrup varietis for cough.  You can use plain "Tylenol" or "Advil" for fever, chills and achyness. Use Halls or Ricola cough drops.   "Common cold" symptoms are usually triggered by a virus.  The antibiotics are usually not necessary. On average, a" viral cold" illness would take 4-7 days to resolve.

## 2018-07-03 DIAGNOSIS — M545 Low back pain: Secondary | ICD-10-CM | POA: Diagnosis not present

## 2018-07-03 DIAGNOSIS — I10 Essential (primary) hypertension: Secondary | ICD-10-CM | POA: Diagnosis not present

## 2018-07-03 DIAGNOSIS — M47816 Spondylosis without myelopathy or radiculopathy, lumbar region: Secondary | ICD-10-CM | POA: Diagnosis not present

## 2018-07-03 DIAGNOSIS — Z6833 Body mass index (BMI) 33.0-33.9, adult: Secondary | ICD-10-CM | POA: Diagnosis not present

## 2018-07-09 ENCOUNTER — Ambulatory Visit: Payer: BLUE CROSS/BLUE SHIELD | Admitting: Internal Medicine

## 2018-07-31 DIAGNOSIS — M25552 Pain in left hip: Secondary | ICD-10-CM | POA: Diagnosis not present

## 2018-07-31 DIAGNOSIS — M1612 Unilateral primary osteoarthritis, left hip: Secondary | ICD-10-CM | POA: Diagnosis not present

## 2018-08-25 DIAGNOSIS — Z6833 Body mass index (BMI) 33.0-33.9, adult: Secondary | ICD-10-CM | POA: Diagnosis not present

## 2018-08-25 DIAGNOSIS — M47816 Spondylosis without myelopathy or radiculopathy, lumbar region: Secondary | ICD-10-CM | POA: Diagnosis not present

## 2018-08-25 DIAGNOSIS — I10 Essential (primary) hypertension: Secondary | ICD-10-CM | POA: Diagnosis not present

## 2018-08-25 DIAGNOSIS — M545 Low back pain: Secondary | ICD-10-CM | POA: Diagnosis not present

## 2018-09-11 DIAGNOSIS — M47816 Spondylosis without myelopathy or radiculopathy, lumbar region: Secondary | ICD-10-CM | POA: Diagnosis not present

## 2018-09-24 DIAGNOSIS — M4316 Spondylolisthesis, lumbar region: Secondary | ICD-10-CM | POA: Diagnosis not present

## 2018-09-24 DIAGNOSIS — M47816 Spondylosis without myelopathy or radiculopathy, lumbar region: Secondary | ICD-10-CM | POA: Diagnosis not present

## 2018-09-25 ENCOUNTER — Other Ambulatory Visit: Payer: Self-pay | Admitting: Internal Medicine

## 2018-09-25 NOTE — Telephone Encounter (Signed)
[place] Controlled Database Checked Last filled: 08/28/18 # 30 LOV w/you: 07/01/18 Next appt w/you: 09/30/18

## 2018-09-26 NOTE — Telephone Encounter (Signed)
Copied from [place] [id number]. Topic: Quick Communication - See Telephone Encounter >> Sep 26, 2018 12:00 PM Blase Mess A wrote: CRM for notification. See Telephone encounter for: 09/26/18.  Patient is calling to check if it is ok that Dr. Alain Marion call her on Tuesday for her appt. She does not want to come into the office. And she does not have a smart phone. Patient is requesting a CB to confirm it is ok- [phone]

## 2018-09-30 ENCOUNTER — Ambulatory Visit: Payer: BLUE CROSS/BLUE SHIELD | Admitting: Internal Medicine

## 2018-09-30 ENCOUNTER — Telehealth (INDEPENDENT_AMBULATORY_CARE_PROVIDER_SITE_OTHER): Payer: BLUE CROSS/BLUE SHIELD

## 2018-09-30 DIAGNOSIS — F419 Anxiety disorder, unspecified: Secondary | ICD-10-CM | POA: Diagnosis not present

## 2018-09-30 NOTE — Telephone Encounter (Signed)
Patient requested a telephone visit for her 3 month follow up due to the pandemic. Patient can be reached at [phone].

## 2018-09-30 NOTE — Telephone Encounter (Addendum)
Cumulative time during 7-day interval 7 min, there was not an associated office visit for this concern within a 7 day period. Patient did provide consent for services prior to services given. Names of all persons present for services: Walker Kehr, MD, Chief complaint: anxiety, refill Xanax History, background, results pertinent: chronic anxiety Past Medical History:  Diagnosis Date  . Allergic rhinitis   . Allergy   . Arthritis   . Cataract    right eye removed with lens implant   . Chest pain   . Colonic polyp   . COPD (chronic obstructive pulmonary disease) ([place])    patient denies at preop on ;   . Dysrhythmia    hx of every third beat is " off" per patient   . Hypertension   . Pneumonia    hx of 2014   . Smoker   . Vitamin B12 deficiency    @results48 @ A/P/next steps: Anxiety. Xanax refilled.  RTC well exam

## 2018-12-05 ENCOUNTER — Other Ambulatory Visit: Payer: Self-pay | Admitting: Internal Medicine

## 2018-12-05 MED ORDER — BUMETANIDE 1 MG PO TABS: 1.0000 mg | ORAL_TABLET | Freq: Every day | ORAL | 1 refills | Status: DC | PRN

## 2018-12-09 ENCOUNTER — Other Ambulatory Visit (INDEPENDENT_AMBULATORY_CARE_PROVIDER_SITE_OTHER): Payer: BC Managed Care – PPO

## 2018-12-09 ENCOUNTER — Other Ambulatory Visit: Payer: Self-pay

## 2018-12-09 ENCOUNTER — Encounter: Payer: Self-pay | Admitting: Internal Medicine

## 2018-12-09 ENCOUNTER — Ambulatory Visit (INDEPENDENT_AMBULATORY_CARE_PROVIDER_SITE_OTHER): Payer: BC Managed Care – PPO | Admitting: Internal Medicine

## 2018-12-09 VITALS — BP 126/82 | HR 85 | Temp 97.6°F | Ht 70.0 in | Wt 240.0 lb

## 2018-12-09 DIAGNOSIS — R600 Localized edema: Secondary | ICD-10-CM | POA: Diagnosis not present

## 2018-12-09 DIAGNOSIS — R0609 Other forms of dyspnea: Secondary | ICD-10-CM

## 2018-12-09 DIAGNOSIS — E538 Deficiency of other specified B group vitamins: Secondary | ICD-10-CM | POA: Diagnosis not present

## 2018-12-09 DIAGNOSIS — I10 Essential (primary) hypertension: Secondary | ICD-10-CM

## 2018-12-09 DIAGNOSIS — R0600 Dyspnea, unspecified: Secondary | ICD-10-CM

## 2018-12-09 LAB — BASIC METABOLIC PANEL
BUN: 12 mg/dL (ref 6–23)
CO2: 26 mEq/L (ref 19–32)
Calcium: 9.2 mg/dL (ref 8.4–10.5)
Chloride: 102 mEq/L (ref 96–112)
Creatinine, Ser: 1.03 mg/dL (ref 0.40–1.20)
GFR: 54.17 mL/min — ABNORMAL LOW (ref >60.00)
Glucose, Bld: 110 mg/dL — ABNORMAL HIGH (ref 70–99)
Potassium: 3.7 mEq/L (ref 3.5–5.1)
Sodium: 137 mEq/L (ref 135–145)

## 2018-12-09 LAB — CBC WITH DIFFERENTIAL/PLATELET
Basophils Absolute: 0.1 10*3/uL (ref 0.0–0.1)
Basophils Relative: 1.2 % (ref 0.0–3.0)
Eosinophils Absolute: 0.6 10*3/uL (ref 0.0–0.7)
Eosinophils Relative: 5.3 % — ABNORMAL HIGH (ref 0.0–5.0)
HCT: 42.5 % (ref 36.0–46.0)
Hemoglobin: 14.2 g/dL (ref 12.0–15.0)
Lymphocytes Relative: 33.5 % (ref 12.0–46.0)
Lymphs Abs: 3.5 10*3/uL (ref 0.7–4.0)
MCHC: 33.3 g/dL (ref 30.0–36.0)
MCV: 94.2 fl (ref 78.0–100.0)
Monocytes Absolute: 0.7 10*3/uL (ref 0.1–1.0)
Monocytes Relative: 7.2 % (ref 3.0–12.0)
Neutro Abs: 5.5 10*3/uL (ref 1.4–7.7)
Neutrophils Relative %: 52.8 % (ref 43.0–77.0)
Platelets: 310 10*3/uL (ref 150.0–400.0)
RBC: 4.51 Mil/uL (ref 3.87–5.11)
RDW: 13.9 % (ref 11.5–15.5)
WBC: 10.4 10*3/uL (ref 4.0–10.5)

## 2018-12-09 LAB — HEPATIC FUNCTION PANEL
ALT: 15 U/L (ref 0–35)
AST: 13 U/L (ref 0–37)
Albumin: 4.1 g/dL (ref 3.5–5.2)
Alkaline Phosphatase: 81 U/L (ref 39–117)
Bilirubin, Direct: 0.2 mg/dL (ref 0.0–0.3)
Total Bilirubin: 1 mg/dL (ref 0.2–1.2)
Total Protein: 7.1 g/dL (ref 6.0–8.3)

## 2018-12-09 LAB — TSH: TSH: 0.86 u[IU]/mL (ref 0.35–4.50)

## 2018-12-09 LAB — BRAIN NATRIURETIC PEPTIDE: Pro B Natriuretic peptide (BNP): 12 pg/mL (ref 0.0–100.0)

## 2018-12-09 MED ORDER — ESTRADIOL 0.5 MG PO TABS: ORAL_TABLET | ORAL | 0 refills | Status: DC

## 2018-12-09 MED ORDER — MEDROXYPROGESTERONE ACETATE 2.5 MG PO TABS: ORAL_TABLET | ORAL | 0 refills | Status: DC

## 2018-12-09 NOTE — Assessment & Plan Note (Addendum)
[facility] ECHO Taper off HRT

## 2018-12-09 NOTE — Assessment & Plan Note (Signed)
On B12 

## 2018-12-09 NOTE — Progress Notes (Signed)
Subjective:  Patient ID: Courtney Nielsen, female    DOB: [birth year]  Age: 63 y.o. MRN: [id number]  CC: No chief complaint on file.   HPI Courtney Nielsen presents for LE swelling x 2 weeks Woke one night SOB x 2 min, had swollen legs later. She felt SOB from that night but less... Much better on Bumex... CT angio chest (-) 08/30/17 On HRT x 20 years  Outpatient Medications Prior to Visit  Medication Sig Dispense Refill   acetaminophen (TYLENOL) 500 MG tablet Take 1,000 mg by mouth every 6 (six) hours as needed for moderate pain.     ALPRAZolam (XANAX) 0.5 MG tablet TAKE ONE TABLET BY MOUTH AT BEDTIME AS NEEDED FOR ANXIETY. 30 tablet 3   aspirin EC 81 MG tablet Take 81 mg by mouth daily.     azithromycin (ZITHROMAX Z-PAK) 250 MG tablet As directed 6 tablet 0   bumetanide (BUMEX) 1 MG tablet Take 1 tablet (1 mg total) by mouth daily as needed (Swelling). 30 tablet 1   celecoxib (CELEBREX) 200 MG capsule Take 1 capsule (200 mg total) by mouth 2 (two) times daily. 60 capsule 5   Cholecalciferol (VITAMIN D3 PO) Take 50 mcg by mouth daily.     Cyanocobalamin (VITAMIN B 12 PO) Take 1,000 mcg by mouth daily.     estradiol (ESTRACE) 0.5 MG tablet estradiol 0.5 mg tablet  TAKE 1 TABLET BY MOUTH DAILY     irbesartan (AVAPRO) 300 MG tablet Take 1 tablet (300 mg total) by mouth daily. 90 tablet 3   loratadine (CLARITIN) 10 MG tablet Take 10 mg by mouth at bedtime.     medroxyPROGESTERone (PROVERA) 2.5 MG tablet medroxyprogesterone 2.5 mg tablet  TAKE 1 T BY MOUTH DAILY     Menthol, Topical Analgesic, (BLUE-EMU MAXIMUM STRENGTH EX) Apply 1 application topically daily.      Omega-3 Fatty Acids (FISH OIL) 500 MG CAPS Take 1 capsule by mouth daily.     traMADol-acetaminophen (ULTRACET) 37.5-325 MG tablet 1/2 or 1 tab po qid prn pain 100 tablet 2   No facility-administered medications prior to visit.     ROS: Review of Systems  Constitutional: Negative for activity change,  appetite change, chills, fatigue and unexpected weight change.  HENT: Negative for congestion, mouth sores and sinus pressure.   Eyes: Negative for visual disturbance.  Respiratory: Positive for shortness of breath. Negative for cough and chest tightness.   Gastrointestinal: Negative for abdominal pain and nausea.  Genitourinary: Negative for difficulty urinating, frequency and vaginal pain.  Musculoskeletal: Positive for arthralgias, back pain and gait problem.  Skin: Negative for pallor and rash.  Neurological: Negative for dizziness, tremors, weakness, numbness and headaches.  Psychiatric/Behavioral: Negative for confusion and sleep disturbance.    Objective:  BP 126/82 (BP Location: Left Arm, Patient Position: Sitting, Cuff Size: Large)    Pulse 85    Temp 97.6 F (36.4 C) (Oral)    Ht 5\' 10"  (1.778 m)    Wt 240 lb (108.9 kg)    SpO2 98%    BMI 34.44 kg/m   BP Readings from Last 3 Encounters:  12/09/18 126/82  07/01/18 128/82  04/08/18 134/86    Wt Readings from Last 3 Encounters:  12/09/18 240 lb (108.9 kg)  07/01/18 234 lb (106.1 kg)  04/08/18 242 lb (109.8 kg)    Physical Exam Constitutional:      General: She is not in acute distress.    Appearance: She is well-developed.  HENT:     Head: Normocephalic.     Right Ear: External ear normal.     Left Ear: External ear normal.     Nose: Nose normal.  Eyes:     General:        Right eye: No discharge.        Left eye: No discharge.     Conjunctiva/sclera: Conjunctivae normal.     Pupils: Pupils are equal, round, and reactive to light.  Neck:     Musculoskeletal: Normal range of motion and neck supple.     Thyroid: No thyromegaly.     Vascular: No JVD.     Trachea: No tracheal deviation.  Cardiovascular:     Rate and Rhythm: Normal rate and regular rhythm.     Heart sounds: Normal heart sounds.  Pulmonary:     Effort: No respiratory distress.     Breath sounds: No stridor. No wheezing.  Abdominal:      General: Bowel sounds are normal. There is no distension.     Palpations: Abdomen is soft. There is no mass.     Tenderness: There is no abdominal tenderness. There is no guarding or rebound.  Musculoskeletal:        General: No tenderness.  Lymphadenopathy:     Cervical: No cervical adenopathy.  Skin:    Findings: No erythema or rash.  Neurological:     Cranial Nerves: No cranial nerve deficit.     Motor: No abnormal muscle tone.     Coordination: Coordination normal.     Deep Tendon Reflexes: Reflexes normal.  Psychiatric:        Behavior: Behavior normal.        Thought Content: Thought content normal.        Judgment: Judgment normal.    obesity  Lab Results  Component Value Date   WBC 7.3 04/02/2018   HGB 14.0 04/02/2018   HCT 41.1 04/02/2018   PLT 236.0 04/02/2018   GLUCOSE 91 04/02/2018   CHOL 183 04/02/2018   TRIG 77.0 04/02/2018   HDL 42.00 04/02/2018   LDLDIRECT 164.6 01/05/2008   LDLCALC 125 (H) 04/02/2018   ALT 15 04/02/2018   AST 13 04/02/2018   NA 141 04/02/2018   K 3.9 04/02/2018   CL 108 04/02/2018   CREATININE 1.07 04/02/2018   BUN 18 04/02/2018   CO2 26 04/02/2018   TSH 0.72 04/02/2018   INR 0.96 11/22/2016   HGBA1C 5.6 03/02/2014    Ct Angio Chest W/cm &/or Wo Cm  Result Date: 08/30/2017 CLINICAL DATA:  63 year old female with acute shortness of breath cough and tachycardia. Elevated D-dimer. EXAM: CT ANGIOGRAPHY CHEST WITH CONTRAST TECHNIQUE: Multidetector CT imaging of the chest was performed using the standard protocol during bolus administration of intravenous contrast. Multiplanar CT image reconstructions and MIPs were obtained to evaluate the vascular anatomy. CONTRAST:  164mL ISOVUE-370 IOPAMIDOL (ISOVUE-370) INJECTION 76% COMPARISON:  08/29/2017 chest radiograph, 12/30/2008 chest CT and prior studies. FINDINGS: Cardiovascular: This is a technically adequate study is but respiratory motion artifact in some portions of the lungs slightly  decreases sensitivity. No pulmonary emboli are identified. Heart size normal. A new small pericardial effusion is noted. Aortic atherosclerotic calcifications noted without aneurysm. Mediastinum/Nodes: UPPER limits of normal sized mediastinal and hilar lymph nodes are unchanged from 2010. No mediastinal mass. Visualized thyroid and esophagus are unremarkable. Lungs/Pleura: Mild dependent/basilar atelectasis/interstitial prominence again noted. There is no evidence of airspace disease, consolidation, mass, nodule, pleural effusion or pneumothorax. Upper  Abdomen: No acute abnormality. Hypodense LEFT hepatic lesions again noted. Musculoskeletal: No chest wall abnormality. No acute or significant osseous findings. Review of the MIP images confirms the above findings. IMPRESSION: 1. Small pericardial effusion of uncertain chronicity but new since 2010. 2. No evidence of pulmonary emboli or thoracic aortic aneurysm. 3. Mild dependent and basilar atelectasis/interstitial prominence again noted. 4.  Aortic Atherosclerosis (ICD10-I70.0). Electronically Signed   By: Margarette Canada M.D.   On: 08/30/2017 12:12    Assessment & Plan:   There are no diagnoses linked to this encounter.   No orders of the defined types were placed in this encounter.    Follow-up: No follow-ups on file.  Walker Kehr, MD

## 2018-12-09 NOTE — Assessment & Plan Note (Signed)
[facility] ECHO Taper off HRT

## 2018-12-09 NOTE — Assessment & Plan Note (Signed)
Losartan 

## 2018-12-10 LAB — D-DIMER, QUANTITATIVE: D-Dimer, Quant: 0.4 mcg/mL FEU (ref <0.50)

## 2018-12-22 ENCOUNTER — Telehealth: Payer: Self-pay

## 2018-12-22 NOTE — Telephone Encounter (Signed)
   Pt informed of visitor's policy and to wear a mask. Pt voiced understanding.  COVID-19 Pre-Screening Questions:  . In the past 7 to 10 days have you had a cough,  shortness of breath, headache, congestion, fever (100 or greater) body aches, chills, sore throat, or sudden loss of taste or sense of smell? NO . Have you been around anyone with known Covid 19.NO . Have you been around anyone who is awaiting Covid 19 test results in the past 7 to 10 days?NO . Have you been around anyone who has been exposed to Covid 19, or has mentioned symptoms of Covid 19 within the past 7 to 10 days? NO  If you have any concerns/questions about symptoms patients report during screening (either on the phone or at threshold). Contact the provider seeing the patient or DOD for further guidance.  If neither are available contact a member of the leadership team.

## 2018-12-23 ENCOUNTER — Telehealth (HOSPITAL_COMMUNITY): Payer: Self-pay | Admitting: Radiology

## 2018-12-23 NOTE — Telephone Encounter (Signed)

## 2018-12-24 ENCOUNTER — Ambulatory Visit (HOSPITAL_COMMUNITY): Payer: BC Managed Care – PPO | Attending: Cardiology

## 2018-12-24 ENCOUNTER — Other Ambulatory Visit: Payer: Self-pay

## 2018-12-24 DIAGNOSIS — R600 Localized edema: Secondary | ICD-10-CM | POA: Insufficient documentation

## 2018-12-24 DIAGNOSIS — R0600 Dyspnea, unspecified: Secondary | ICD-10-CM

## 2018-12-24 DIAGNOSIS — R0609 Other forms of dyspnea: Secondary | ICD-10-CM | POA: Insufficient documentation

## 2018-12-26 ENCOUNTER — Encounter: Payer: Self-pay | Admitting: Cardiology

## 2018-12-26 ENCOUNTER — Ambulatory Visit: Payer: BC Managed Care – PPO | Admitting: Cardiology

## 2018-12-26 ENCOUNTER — Other Ambulatory Visit: Payer: Self-pay

## 2018-12-26 VITALS — BP 138/88 | HR 85 | Temp 98.4°F | Ht 70.5 in | Wt 235.0 lb

## 2018-12-26 DIAGNOSIS — Z7189 Other specified counseling: Secondary | ICD-10-CM

## 2018-12-26 DIAGNOSIS — R0789 Other chest pain: Secondary | ICD-10-CM

## 2018-12-26 DIAGNOSIS — R0602 Shortness of breath: Secondary | ICD-10-CM | POA: Diagnosis not present

## 2018-12-26 DIAGNOSIS — I10 Essential (primary) hypertension: Secondary | ICD-10-CM

## 2018-12-26 DIAGNOSIS — R600 Localized edema: Secondary | ICD-10-CM | POA: Diagnosis not present

## 2018-12-26 NOTE — Progress Notes (Signed)
Cardiology Office Note:    Date:  12/26/2018   ID:  Courtney Nielsen, DOB [birth year], MRN [id number]  PCP:  Cassandria Anger, MD  Cardiologist:  Buford Dresser, MD PhD  Referring MD: Cassandria Anger, MD   CC: new consult for shortness of breath, swelling, chest tightness  History of Present Illness:    Courtney Nielsen is a 63 y.o. female with a hx of COPD, hypertension who is seen as a new consult at the request of Plotnikov, Evie Lacks, MD for the evaluation and management of shortness of breath, edema, and chest pain.  Echocardiogram 12/24/18 showed EF 34-19%, grade 1 diastolic dysfunction. No significant valve disease. Labs showed Cr 1.03, nl TSH, largely nl CBC (borderline eosinophils), normal LFTs, normal D dimer, normal BNP of 12.  Has had intermittent leg swelling--notes that it goes away after a nerve block and then increases when the injection wears off about 3 mos later. The difference with the event that started about a month ago was shortness of breath on top of leg swelling. At the same time felt tightness in her chest, not positional, felt like she couldn't take a deep breath for a few minutes. Shortness of breath resolved after one day of bumex, but swelling was constant until she took the fluid pill, then everything went away at the same time on the fluid pill. Has not had any additional chest tightness since taking the bumex.  Has been told she has an abnormal every 3rd beat, can feel it skip a beat on occasion. May not feel it for months, but one Sunday she felt two beats back to back.   Has been taking bumex daily, but hasn't been able to take in 3 days and noticed some increase in swelling in her legs.  Doesn't weigh herself at home. No PND. Can't lie flat due to back issues, not breathing.  Has been predominantly working from home, noted that her BP seems to go up when she has to go into the office more.   Quit smoking 8 years ago.  Past Medical  History:  Diagnosis Date  . Allergic rhinitis   . Allergy   . Arthritis   . Cataract    right eye removed with lens implant   . Chest pain   . Colonic polyp   . COPD (chronic obstructive pulmonary disease) ([place])    patient denies at preop on ;   . Dysrhythmia    hx of every third beat is " off" per patient   . Hypertension   . Pneumonia    hx of 2014   . Smoker   . Vitamin B12 deficiency     Past Surgical History:  Procedure Laterality Date  . CARDIAC CATHETERIZATION     cath negative per patient   . CATARACT EXTRACTION W/PHACO Right 06/11/2016   Procedure: CATARACT EXTRACTION PHACO AND INTRAOCULAR LENS PLACEMENT RIGHT EYE CDE=6.15;  Surgeon: Tonny Branch, MD;  Location: [facility];  Service: Ophthalmology;  Laterality: Right;  right  . COLONOSCOPY    . KNEE ARTHROSCOPY Right 10/19/2015   Procedure: RIGHT KNEE SCOPE WITH MENISCAL DEBRIDEMENT,LATERAL SYNOVECTOMY;  Surgeon: Gaynelle Arabian, MD;  Location: [facility];  Service: Orthopedics;  Laterality: Right;  . POLYPECTOMY    . TOTAL KNEE ARTHROPLASTY Right 12/03/2016   Procedure: RIGHT TOTAL KNEE ARTHROPLASTY;  Surgeon: Gaynelle Arabian, MD;  Location: [facility];  Service: Orthopedics;  Laterality: Right;    Current Medications: Current Outpatient Medications on File Prior  to Visit  Medication Sig  . acetaminophen (TYLENOL) 500 MG tablet Take 1,000 mg by mouth every 6 (six) hours as needed for moderate pain.  Marland Kitchen ALPRAZolam (XANAX) 0.5 MG tablet TAKE ONE TABLET BY MOUTH AT BEDTIME AS NEEDED FOR ANXIETY.  Marland Kitchen aspirin EC 81 MG tablet Take 81 mg by mouth daily.  Marland Kitchen azithromycin (ZITHROMAX Z-PAK) 250 MG tablet As directed  . bumetanide (BUMEX) 1 MG tablet Take 1 tablet (1 mg total) by mouth daily as needed (Swelling).  . celecoxib (CELEBREX) 200 MG capsule Take 1 capsule (200 mg total) by mouth 2 (two) times daily.  . Cholecalciferol (VITAMIN D3 PO) Take 50 mcg by mouth daily.  . Cyanocobalamin (VITAMIN B 12 PO) Take 1,000 mcg by mouth daily.  Marland Kitchen  estradiol (ESTRACE) 0.5 MG tablet Taper off and stop  . irbesartan (AVAPRO) 300 MG tablet Take 1 tablet (300 mg total) by mouth daily.  Marland Kitchen loratadine (CLARITIN) 10 MG tablet Take 10 mg by mouth at bedtime.  . medroxyPROGESTERone (PROVERA) 2.5 MG tablet Taper off and stop  . Menthol, Topical Analgesic, (BLUE-EMU MAXIMUM STRENGTH EX) Apply 1 application topically daily.   . Omega-3 Fatty Acids (FISH OIL) 500 MG CAPS Take 1 capsule by mouth daily.  . traMADol-acetaminophen (ULTRACET) 37.5-325 MG tablet 1/2 or 1 tab po qid prn pain   No current facility-administered medications on file prior to visit.      Allergies:   Meloxicam, Dilaudid [hydromorphone hcl], Levaquin [levofloxacin], Ceftin [cefuroxime axetil], Coreg [carvedilol], Furosemide, Prednisone, Sulfonamide derivatives, and Verapamil   Social History   Socioeconomic History  . Marital status: Married    Spouse name: Not on file  . Number of children: Not on file  . Years of education: Not on file  . Highest education level: Not on file  Occupational History  . Not on file  Social Needs  . Financial resource strain: Not on file  . Food insecurity    Worry: Not on file    Inability: Not on file  . Transportation needs    Medical: Not on file    Non-medical: Not on file  Tobacco Use  . Smoking status: Former Smoker    Packs/day: 1.00    Years: 20.00    Pack years: 20.00    Types: Cigarettes    Quit date: 07/03/2011    Years since quitting: 7.4  . Smokeless tobacco: Never Used  Substance and Sexual Activity  . Alcohol use: No  . Drug use: No  . Sexual activity: Yes    Birth control/protection: Post-menopausal  Lifestyle  . Physical activity    Days per week: Not on file    Minutes per session: Not on file  . Stress: Not on file  Relationships  . Social Herbalist on phone: Not on file    Gets together: Not on file    Attends religious service: Not on file    Active member of club or organization: Not on  file    Attends meetings of clubs or organizations: Not on file    Relationship status: Not on file  Other Topics Concern  . 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 There is no history of Colon cancer, Colon polyps, Rectal cancer, or Stomach cancer.  ROS:   Please see the history of present illness.  Additional pertinent ROS:  Constitutional: Negative for chills, fever, night sweats, unintentional weight loss  HENT: Negative for ear pain and hearing loss.   Eyes: Negative for loss of vision and eye pain.  Respiratory: Negative for cough, sputum, wheezing.  Otherwise as per HPI Cardiovascular: See HPI. Gastrointestinal: Negative for abdominal pain, melena, and hematochezia.  Genitourinary: Negative for dysuria and hematuria.  Musculoskeletal: Negative for falls and myalgias.  Skin: Negative for itching and rash.  Neurological: Negative for focal weakness, focal sensory changes and loss of consciousness.  Endo/Heme/Allergies: Does not bruise/bleed easily.    EKGs/Labs/Other Studies Reviewed:    The following studies were reviewed today: Echo 12/24/18  1. The left ventricle has normal systolic function, with an ejection fraction of 55-60%. The cavity size was normal. There is mildly increased left ventricular wall thickness. Left ventricular diastolic Doppler parameters are consistent with impaired  relaxation.  2. The right ventricle has normal systolic function. The cavity was normal.  3. The mitral valve is grossly normal.  4. The tricuspid  valve is grossly normal.  5. The aortic valve is tricuspid. No stenosis of the aortic valve.  6. Normal LV systolic function; mild diastolic dysfunction; mild LVH.  EKG:  EKG is personally reviewed.  The ekg ordered today demonstrates NSR  Recent Labs: 12/09/2018: ALT 15; BUN 12; Creatinine, Ser 1.03; Hemoglobin 14.2; Platelets 310.0; Potassium 3.7; Pro B Natriuretic peptide (BNP) 12.0; Sodium 137; TSH 0.86  Recent Lipid Panel    Component Value Date/Time   CHOL 183 04/02/2018 0840   TRIG 77.0 04/02/2018 0840   HDL 42.00 04/02/2018 0840   CHOLHDL 4 04/02/2018 0840   VLDL 15.4 04/02/2018 0840   LDLCALC 125 (H) 04/02/2018 0840   LDLDIRECT 164.6 01/05/2008 0844    Physical Exam:    VS:  BP 138/88 (BP Location: Left Arm, Patient Position: Sitting, Cuff Size: Large)   Pulse 85   Temp 98.4 F (36.9 C)   Ht 5' 10.5" (1.791 m)   Wt 235 lb (106.6 kg)   BMI 33.24 kg/m     Wt Readings from Last 3 Encounters:  12/26/18 235 lb (106.6 kg)  12/09/18 240 lb (108.9 kg)  07/01/18 234 lb (106.1 kg)     GEN: Well nourished, well developed in no acute distress HEENT: Normal NECK: No JVD; No carotid bruits LYMPHATICS: No lymphadenopathy CARDIAC: regular rhythm, normal S1 and S2, no murmurs, rubs, gallops. Radial and DP pulses 2+ bilaterally. RESPIRATORY:  Clear to auscultation without rales, wheezing or rhonchi  ABDOMEN: Soft, non-tender, non-distended MUSCULOSKELETAL:  Trace bilateral LE edema; No deformity  SKIN: Warm and dry NEUROLOGIC:  Alert and oriented x 3 PSYCHIATRIC:  Normal affect   ASSESSMENT:    1. SOB (shortness of breath)   2. Essential hypertension   3. Bilateral leg edema   4. Other chest pain   5. Cardiac risk counseling   6. Encounter for education about heart failure    PLAN:    Shortness of breath, bilateral LE edema, episode of chest tightness: improving with Bumex -echo with normal LV EF, grade 1 DD -BNP normal. While her BMI is 33, a BNP of 12 would still  be well within  normal range with mild obesity -chest tightness resolved with single dose of Bumex. Not exertional. With normal LV EF and low ASCVD risk score, low suspicion for ischemia as etiology. She will contact us if her symptoms change -BMET, TSH, CBC, LFTs, d-dimer WNL -suspect this may be related to some neuropathy/venous insufficiency given the description that it waxes and wanes with nerve injections. May be that when she hits a tipping point with fluid she had some mild diastolic heart failure that causes her shortness of breath -gave education on diastolic heart failure today, including on patient instructions -discussed daily weights, weight based dosing of bumex, compression stockings, fluid management, salt avoidance  Hypertension: goal <130/80. Recent visit with Dr. Alain Marion was 126/82, so will not make changes today -on irbesartan -could consider adding chlorthalidone if BP rises--in which case, may not require bumex  Cardiac risk counseling and prevention recommendations: -recommend heart healthy/Mediterranean diet, with whole grains, fruits, vegetable, fish, lean meats, nuts, and olive oil. Limit salt. -recommend moderate walking, 3-5 times/week for 30-50 minutes each session. Aim for at least 150 minutes.week. Goal should be pace of 3 miles/hours, or walking 1.5 miles in 30 minutes -recommend avoidance of tobacco products. Avoid excess alcohol. -Additional risk factor control:  -Diabetes: A1c is not available, denies history of diabetes  -Lipids: Tchol 183, HDL 42, LDL 125  -Blood pressure control: as above  -Weight: BMI 33. Would benefit from weight loss -ASCVD risk score: The 10-year ASCVD risk score Mikey Bussing DC Jr., et al., 2013) is: 5.8%   Values used to calculate the score:     Age: 50 years     Sex: Female     Is Non-Hispanic African American: No     Diabetic: No     Tobacco smoker: No     Systolic Blood Pressure: 401 mmHg     Is BP treated: Yes     HDL  Cholesterol: 42 mg/dL     Total Cholesterol: 183 mg/dL   -on aspirin 81 mg per personal preference, discussed guidelines today -not on statin, with ASCVD risk as above and no ASCVD history or diabetes, would focus on lifestyle management first. Aim for LDL closer to 100 if possible.  Plan for follow up: 1 year or sooner PRN  Medication Adjustments/Labs and Tests Ordered: Current medicines are reviewed at length with the patient today.  Concerns regarding medicines are outlined above.  Orders Placed This Encounter  Procedures  . EKG 12-Lead   No orders of the defined types were placed in this encounter.   Patient Instructions  Medication Instructions:  Your Physician recommend you continue on your current medication as directed.    If you need a refill on your cardiac medications before your next appointment, please call your pharmacy.   Lab work: None  Testing/Procedures: None  Follow-Up: At [facility], you and your health needs are our priority.  As part of our continuing mission to provide you with exceptional heart care, we have created designated Provider Care Teams.  These Care Teams include your primary Cardiologist (physician) and Advanced Practice Providers (APPs -  Physician Assistants and Nurse Practitioners) who all work together to provide you with the care you need, when you need it. You will need a follow up appointment in 1 years.  Please call our office 2 months in advance to schedule this appointment.  You may see Dr.Ikey Omary or one of the following Advanced Practice Providers on your designated Care Team:   Rosaria Ferries, PA-C .  Jory Sims, DNP, ANP   Do the following things EVERY DAY:  1) Weigh yourself EVERY morning after you go to the bathroom but before you eat or drink anything. Write this number down in a weight log/diary. If you gain 3 pounds overnight or 5 pounds in a week, take a bumex pill.  2) Take your medicines as prescribed. If you  have concerns about your medications, please call us before you stop taking them.   3) Eat low salt foods-Limit salt (sodium) to 2000 mg per day. This will help prevent your body from holding onto fluid. Read food labels as many processed foods have a lot of sodium, especially canned goods and prepackaged meats. If you would like some assistance choosing low sodium foods, we would be happy to set you up with a nutritionist.  4) Stay as active as you can everyday. Staying active will give you more energy and make your muscles stronger. Start with 5 minutes at a time and work your way up to 30 minutes a day. Break up your activities--do some in the morning and some in the afternoon. Start with 3 days per week and work your way up to 5 days as you can.  If you have chest pain, feel short of breath, dizzy, or lightheaded, STOP. If you don't feel better after a short rest, call 911. If you do feel better, call the office to let us know you have symptoms with exercise.  5) Wear compression stockings and elevated your feet when you can to minimize swelling.     Signed, Buford Dresser, MD PhD 12/26/2018 [place]

## 2018-12-26 NOTE — Patient Instructions (Addendum)
Medication Instructions:  Your Physician recommend you continue on your current medication as directed.    If you need a refill on your cardiac medications before your next appointment, please call your pharmacy.   Lab work: None  Testing/Procedures: None  Follow-Up: At [facility], you and your health needs are our priority.  As part of our continuing mission to provide you with exceptional heart care, we have created designated Provider Care Teams.  These Care Teams include your primary Cardiologist (physician) and Advanced Practice Providers (APPs -  Physician Assistants and Nurse Practitioners) who all work together to provide you with the care you need, when you need it. You will need a follow up appointment in 1 years.  Please call our office 2 months in advance to schedule this appointment.  You may see Dr.Delva Derden or one of the following Advanced Practice Providers on your designated Care Team:   Rosaria Ferries, PA-C . Jory Sims, DNP, ANP   Do the following things EVERY DAY:  1) Weigh yourself EVERY morning after you go to the bathroom but before you eat or drink anything. Write this number down in a weight log/diary. If you gain 3 pounds overnight or 5 pounds in a week, take a bumex pill.  2) Take your medicines as prescribed. If you have concerns about your medications, please call us before you stop taking them.   3) Eat low salt foods-Limit salt (sodium) to 2000 mg per day. This will help prevent your body from holding onto fluid. Read food labels as many processed foods have a lot of sodium, especially canned goods and prepackaged meats. If you would like some assistance choosing low sodium foods, we would be happy to set you up with a nutritionist.  4) Stay as active as you can everyday. Staying active will give you more energy and make your muscles stronger. Start with 5 minutes at a time and work your way up to 30 minutes a day. Break up your activities--do some  in the morning and some in the afternoon. Start with 3 days per week and work your way up to 5 days as you can.  If you have chest pain, feel short of breath, dizzy, or lightheaded, STOP. If you don't feel better after a short rest, call 911. If you do feel better, call the office to let us know you have symptoms with exercise.  5) Wear compression stockings and elevated your feet when you can to minimize swelling.

## 2018-12-27 DIAGNOSIS — H25042 Posterior subcapsular polar age-related cataract, left eye: Secondary | ICD-10-CM | POA: Diagnosis not present

## 2018-12-30 ENCOUNTER — Encounter: Payer: Self-pay | Admitting: Cardiology

## 2019-01-01 DIAGNOSIS — M47816 Spondylosis without myelopathy or radiculopathy, lumbar region: Secondary | ICD-10-CM | POA: Diagnosis not present

## 2019-01-08 DIAGNOSIS — H43813 Vitreous degeneration, bilateral: Secondary | ICD-10-CM | POA: Diagnosis not present

## 2019-01-08 DIAGNOSIS — H348321 Tributary (branch) retinal vein occlusion, left eye, with retinal neovascularization: Secondary | ICD-10-CM | POA: Diagnosis not present

## 2019-01-08 DIAGNOSIS — H26491 Other secondary cataract, right eye: Secondary | ICD-10-CM | POA: Diagnosis not present

## 2019-01-08 DIAGNOSIS — H35031 Hypertensive retinopathy, right eye: Secondary | ICD-10-CM | POA: Diagnosis not present

## 2019-01-08 DIAGNOSIS — H25812 Combined forms of age-related cataract, left eye: Secondary | ICD-10-CM | POA: Diagnosis not present

## 2019-01-08 DIAGNOSIS — H4312 Vitreous hemorrhage, left eye: Secondary | ICD-10-CM | POA: Diagnosis not present

## 2019-01-26 ENCOUNTER — Ambulatory Visit (HOSPITAL_COMMUNITY): Payer: BC Managed Care – PPO | Attending: Anesthesiology

## 2019-01-26 ENCOUNTER — Other Ambulatory Visit: Payer: Self-pay

## 2019-01-26 ENCOUNTER — Encounter (HOSPITAL_COMMUNITY): Payer: Self-pay

## 2019-01-26 DIAGNOSIS — M6281 Muscle weakness (generalized): Secondary | ICD-10-CM | POA: Diagnosis not present

## 2019-01-26 DIAGNOSIS — G8929 Other chronic pain: Secondary | ICD-10-CM | POA: Diagnosis not present

## 2019-01-26 DIAGNOSIS — M545 Low back pain: Secondary | ICD-10-CM | POA: Diagnosis not present

## 2019-01-26 NOTE — Therapy (Signed)
[place], [place], [address] Phone: [phone]   Fax:  [phone]  Physical Therapy Evaluation  Patient Details  Name: Courtney Nielsen MRN: [id number] Date of Birth: Apr 08, 1956 Referring Provider (PT): Clydell Hakim, MD   Encounter Date: 01/26/2019  PT End of Session - 01/26/19 1210    Visit Number  1    Number of Visits  4    Date for PT Re-Evaluation  02/09/19    Authorization Type  [employer] PPO    Authorization Time Period  01/26/19 to 02/09/19    Authorization - Visit Number  1    Authorization - Number of Visits  60   60 limit max yearly PT/OT/ST combined   PT Start Time  1115    PT Stop Time  1200    PT Time Calculation (min)  45 min    Activity Tolerance  Patient tolerated treatment well    Behavior During Therapy  [facility] for tasks assessed/performed       Past Medical History:  Diagnosis Date  . Allergic rhinitis   . Allergy   . Arthritis   . Cataract    right eye removed with lens implant   . Chest pain   . Colonic polyp   . COPD (chronic obstructive pulmonary disease) ([place])    patient denies at preop on ;   . Dysrhythmia    hx of every third beat is " off" per patient   . Hypertension   . Pneumonia    hx of 2014   . Smoker   . Vitamin B12 deficiency     Past Surgical History:  Procedure Laterality Date  . CARDIAC CATHETERIZATION     cath negative per patient   . CATARACT EXTRACTION W/PHACO Right 06/11/2016   Procedure: CATARACT EXTRACTION PHACO AND INTRAOCULAR LENS PLACEMENT RIGHT EYE CDE=6.15;  Surgeon: Tonny Branch, MD;  Location: [facility];  Service: Ophthalmology;  Laterality: Right;  right  . COLONOSCOPY    . KNEE ARTHROSCOPY Right 10/19/2015   Procedure: RIGHT KNEE SCOPE WITH MENISCAL DEBRIDEMENT,LATERAL SYNOVECTOMY;  Surgeon: Gaynelle Arabian, MD;  Location: [facility];  Service: Orthopedics;  Laterality: Right;  . POLYPECTOMY    . TOTAL KNEE ARTHROPLASTY Right 12/03/2016   Procedure:  RIGHT TOTAL KNEE ARTHROPLASTY;  Surgeon: Gaynelle Arabian, MD;  Location: [facility];  Service: Orthopedics;  Laterality: Right;    There were no vitals filed for this visit.   Subjective Assessment - 01/26/19 1120    Subjective  Pt reports pain for 3-4 years with doctor performing nerve block injections; MD wants to do radiofrequency ablation because nerve blocks aren't lasting long enough. Pt reports insurance is making her come to PT before she can have ablation procedure. Pt reports R side lumbar ablation surgery scheduled for Thursday 01/29/19. Pt reports LBP, R>L, no pain down into legs. Denies changes in b/b, numbness/tingling, falls, or need for AD. Pt reports difficulty with bending down to pick stuff up off floor, washing dishes, and sleeping in bed. Pt reports she is unable to vacuum or mop due to pain. Pt reports she sleeps on couch for ~3 hours then in bed ~3 hours then returns to couch for 10-15 minutes in AM for pain relief before she can stand upight. Pt reports receiving nerve block injections since 2017 with some relief and 1 this year 2020 with relief <3 months. Pt reports minimal pain relief with positional changes, but otherwise no pain improvement. Pt  reports spouse recently home sick increasing her stress level and L shoulder tightness throughout her neck. Pt reports most painful first thing in the morning and unable to stand up straight for 5-10 minutes. Pt reports occaisonally dropping things randomly and unsure if that is due to low back pain or age. Pt reports she needs L THA, but MD wants to wait until fully bone on bone.    Limitations  Lifting    How long can you sit comfortably?  1 hour, shifting weight then stands to walk around after 1 hour    How long can you stand comfortably?  10-15 minutes before needing to lean forward onto something    How long can you walk comfortably?  10-15 minutes before needing to lean forward onto something    Diagnostic tests  none recently     Patient Stated Goals  get some exercises to do at home to help relieve the pain    Currently in Pain?  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   No    Is the patient reluctant to leave their home because of a fear of falling?   No      Prior Function   Level of Independence  Independent   can't vaccum/nop due to pain   Vocation  Full time employment    Merchandiser, retail, desk/computer work 8 hrs day    Leisure  Hanging out with family      Observation/Other Assessments   Focus on Therapeutic Outcomes (FOTO)   to be completed next visit      Sensation   Light Touch  Appears Intact      Functional Tests   Functional tests  Sit to Stand      Sit to Stand   Comments  5x STS: 10.40 sec, no UE, from chair      ROM / Strength   AROM / PROM / Strength  AROM;Strength      AROM   AROM Assessment Site  Lumbar    Lumbar Flexion  10" from floor    Lumbar Extension  50% limited    Lumbar -  Right Side Bend  knee jt line    Lumbar - Left Side Bend  1" from knee jt line    Lumbar - Right Rotation  25% limited    Lumbar - Left Rotation  WNL      Strength   Strength Assessment Site  Hip;Knee;Ankle    Right Hip Flexion  4/5    Right Hip Extension  4/5    Right Hip ABduction  4/5    Left Hip Flexion  4/5    Left Hip Extension  4/5    Left Hip ABduction  4/5    Right Knee Flexion  4+/5    Right Knee Extension  5/5    Left Knee Flexion  4+/5    Left Knee Extension  5/5    Right Ankle Dorsiflexion  5/5    Left Ankle Dorsiflexion  5/5      Flexibility   Soft Tissue Assessment /Muscle Length  yes    Hamstrings  WNL bil    Piriformis  WNL bil      Palpation   Spinal mobility  Tenderness throughout L1-5; pt reports "uncomfortable" and not same pain    Palpation comment  Denies tenderness/pain throughout bil lumbar and gluteal paraspinals      Special Tests    Special Tests  Lumbar    Lumbar Tests  Slump Test;Straight Leg Raise      Slump test   Findings  Negative    Comment  bil      Straight Leg Raise   Findings  Positive    Side   Right    Comment  Pain in R low back, none down LE      Ambulation/Gait   Ambulation/Gait  Yes    Ambulation/Gait Assistance  7: Independent    Ambulation Distance (Feet)  744 Feet    Assistive device  None    Gait Pattern  Step-through pattern;Trendelenburg   L trendelenburg   Ambulation Surface  Level;Indoor    Gait velocity  1.26 m/s    Gait Comments  3MWT      Balance   Balance Assessed  Yes      Static Standing Balance   Static Standing - Balance Support  No upper extremity supported    Static Standing Balance -  Activities   Single Leg Stance - Right Leg;Single Leg Stance - Left Leg    Static Standing - Comment/# of Minutes  R: 18 sec, L: 45+ sec        Objective measurements completed on examination: See above findings.     PT Education - 01/26/19 1209    Education Details  Assessment findings, benefits of PT for  strengthening muscles to reduce pain, intiaited HEP    Person(s) Educated  Patient    Methods  Explanation;Handout    Comprehension  Verbalized understanding       PT Short Term Goals - 01/26/19 1238      PT SHORT TERM GOAL #1   Title  Pt will independently perform HEP with proper technique to improve strength and reduce pain.    Time  1    Period  Weeks    Status  New    Target Date  02/02/19        PT Long Term Goals - 01/26/19 1239      PT LONG TERM GOAL #1   Title  Pt will improve lumbar AROM to [facility] in  all directions, without increase in LBP.    Time  2    Period  Weeks    Status  New    Target Date  02/09/19      PT LONG TERM GOAL #2   Title  Pt will improve L SLS to 30 sec without Trendelenburg to reduce risk for falls and demo improved L glute functional strength.    Time  2    Period  Weeks    Status  New      PT LONG TERM GOAL #3   Title  Pt will improve BLE strength by 1/2 MMT grade to improve ability to perform functional tasks at home without difficulty.    Time  2    Period  Weeks    Status  New      PT LONG TERM GOAL #4   Title  Pt will report sleeping 6 hours in bed with <3 awakenings and <5/10 LBP to improve QoL.    Time  2    Period  Weeks    Status  New             Plan - 01/26/19 1212    Clinical Impression Statement  Pt is a pleasant 63 YO female with complaints of low back pain for 3-4 years, previously experiencing pain relief with nerve block injections, but no longer lasting for prolonged period of time and MD wanting to perform lumbar spinal ablation procedures with physical therapy to relieve pt's pain. Pt demonstrates deficits in BLE strength and lumbar AROM. Pt with constant LBP, limited with sleeping, household chores, and extreme difficulty retrieving objects from floor.Func5ionally, pt with good gait speend, minor gait deficits and able to perform 5xSTS within age related norms and no pain increase. Pt demonstrates decreased  balance AEB SLS score for LLE, but able to maintain R SLS for > 45 sec without unsteadiness. Pt performed SLS with hip drop noted in L SLS. Pt reports L THA needing to be done, but waiting until it becomes worse because back pain is more limiting than hip pain currently. Pt unable to recreate pt's same pain syptoms with palpation, but reports some discomfort with palpation to spinoud processes for all lumbar vertebrae and lumbar extension ROM. PT recommended therapy for 3 weeks, but pt reports 2 weeks should be long enough to establish HEP and experience pain relief; will reassess at end of 2 weeks. Pt would benefit from skilled PT interventions to improve strength, ROM, gait mechanics, balance, establish HEP and reduce pain to improve overall ability to perform functional tasks.    Personal Factors and Comorbidities  Age;Comorbidity 2;Past/Current Experience;Time since onset of injury/illness/exacerbation    Comorbidities  see above    Examination-Activity Limitations  Bed Mobility;Bend;Locomotion Level;Sleep;Stand    Examination-Participation Restrictions  Cleaning;Laundry    Stability/Clinical Decision Making  Stable/Uncomplicated    Clinical Decision Making  Low    Rehab Potential  Good    PT Frequency  2x / week    PT Duration  2 weeks    PT Treatment/Interventions  ADLs/Self Care Home Management;Aquatic Therapy;Biofeedback;Cryotherapy;Electrical Stimulation;Moist Heat;Traction;Ultrasound;Gait training;Stair training;Functional mobility training;Therapeutic activities;Therapeutic exercise;Balance training;Neuromuscular re-education;Patient/family education;Orthotic Fit/Training;Manual techniques;Passive range of motion;Dry needling;Taping;Spinal Manipulations;Joint Manipulations    PT Next Visit Plan  Review goals, administer FOTO. Update HEP each visit. Strengthen BLE, core, and postural muscles; QL doorframe stretch, lumbar/thoracic mobility exercises. Stretching as needed to improve muscle  extensiblity and relieve pain. Manual STM as needed for pain relief.  PT Home Exercise Plan  Eval: STS, bridges    Consulted and Agree with Plan of Care  Patient       Patient will benefit from skilled therapeutic intervention in order to improve the following deficits and impairments:  Abnormal gait, Decreased activity tolerance, Decreased balance, Decreased endurance, Decreased range of motion, Decreased strength, Increased muscle spasms, Improper body mechanics, Pain  Visit Diagnosis: 1. Chronic bilateral low back pain without sciatica   2. Muscle weakness (generalized)        Problem List Patient Active Problem List   Diagnosis Date Noted  . DOE (dyspnea on exertion) 12/09/2018  . Tachycardia 08/29/2017  . Periscapular pain 08/29/2017  . Primary osteoarthritis of right knee 12/04/2016  . OA (osteoarthritis) of knee 12/03/2016  . LLQ abdominal pain 03/15/2016  . Lateral meniscal tear 10/19/2015  . Left hip pain 07/22/2013  . Occipital neuralgia 06/04/2013  . Leg pain 02/22/2012  . Gout 02/22/2012  . Weight gain 01/11/2012  . Bilateral leg edema 06/07/2011  . Pruritus 04/23/2011  . Well adult exam 04/23/2011  . Pneumonia, organism unspecified(486) 04/21/2011  . Cough 04/19/2011  . Paresthesia 09/26/2010  . NECK PAIN 08/22/2010  . BACK PAIN, THORACIC REGION 08/22/2010  . Low back pain 08/22/2010  . DIZZINESS 08/22/2010  . Cerumen impaction 07/21/2010  . CRAMP OF LIMB 03/16/2010  . BRONCHITIS, ACUTE 07/22/2009  . COPD 07/22/2009  . MEDIASTINAL LYMPHADENOPATHY 08/16/2008  . Nonspecific (abnormal) findings on radiological and other examination of body structure 08/16/2008  . CT, CHEST, ABNORMAL 08/16/2008  . BRADYCARDIA 06/23/2008  . GERD 06/23/2008  . SYNCOPE 06/23/2008  . B12 deficiency 04/09/2008  . Pain in Soft Tissues of Limb 01/08/2008  . RASH AND OTHER NONSPECIFIC SKIN ERUPTION 01/08/2008  . SINUSITIS, ACUTE 10/31/2007  . HIP PAIN 10/31/2007  . Essential  hypertension 04/21/2007  . MENOPAUSAL SYNDROME 04/21/2007  . ALLERGIC RHINITIS 04/19/2007  . COLONIC POLYPS, HX OF 04/19/2007       Talbot Grumbling PT, DPT  [facility] [address] [place], [place], [address] Phone: [phone]   Fax:  [phone]  Name: Courtney Nielsen MRN: [id number] Date of Birth: 06-15-1956

## 2019-01-27 ENCOUNTER — Ambulatory Visit (HOSPITAL_COMMUNITY): Payer: BC Managed Care – PPO

## 2019-01-27 ENCOUNTER — Encounter (HOSPITAL_COMMUNITY): Payer: Self-pay

## 2019-01-27 DIAGNOSIS — G8929 Other chronic pain: Secondary | ICD-10-CM

## 2019-01-27 DIAGNOSIS — M545 Low back pain: Secondary | ICD-10-CM | POA: Diagnosis not present

## 2019-01-27 DIAGNOSIS — M6281 Muscle weakness (generalized): Secondary | ICD-10-CM

## 2019-01-27 NOTE — Therapy (Signed)
[place], [place], [address] Phone: [phone]   Fax:  [phone]  Physical Therapy Treatment  Patient Details  Name: Courtney Nielsen MRN: [id number] Date of Birth: 13-Oct-1955 Referring Provider (PT): Clydell Hakim, MD   Encounter Date: 01/27/2019  PT End of Session - 01/27/19 0928    Visit Number  2    Number of Visits  4    Date for PT Re-Evaluation  02/09/19    Authorization Type  [employer] PPO    Authorization Time Period  01/26/19 to 02/09/19    Authorization - Visit Number  2    Authorization - Number of Visits  60   60 visit max limit for combined PT/OT/SLP   PT Start Time  0920    PT Stop Time  1005    PT Time Calculation (min)  45 min    Activity Tolerance  Patient tolerated treatment well    Behavior During Therapy  [facility] for tasks assessed/performed       Past Medical History:  Diagnosis Date  . Allergic rhinitis   . Allergy   . Arthritis   . Cataract    right eye removed with lens implant   . Chest pain   . Colonic polyp   . COPD (chronic obstructive pulmonary disease) ([place])    patient denies at preop on ;   . Dysrhythmia    hx of every third beat is " off" per patient   . Hypertension   . Pneumonia    hx of 2014   . Smoker   . Vitamin B12 deficiency     Past Surgical History:  Procedure Laterality Date  . CARDIAC CATHETERIZATION     cath negative per patient   . CATARACT EXTRACTION W/PHACO Right 06/11/2016   Procedure: CATARACT EXTRACTION PHACO AND INTRAOCULAR LENS PLACEMENT RIGHT EYE CDE=6.15;  Surgeon: Tonny Branch, MD;  Location: [facility];  Service: Ophthalmology;  Laterality: Right;  right  . COLONOSCOPY    . KNEE ARTHROSCOPY Right 10/19/2015   Procedure: RIGHT KNEE SCOPE WITH MENISCAL DEBRIDEMENT,LATERAL SYNOVECTOMY;  Surgeon: Gaynelle Arabian, MD;  Location: [facility];  Service: Orthopedics;  Laterality: Right;  . POLYPECTOMY    . TOTAL KNEE ARTHROPLASTY Right 12/03/2016   Procedure:  RIGHT TOTAL KNEE ARTHROPLASTY;  Surgeon: Gaynelle Arabian, MD;  [facility];  Service: Orthopedics;  Laterality: Right;    There were no vitals filed for this visit.  Subjective Assessment - 01/27/19 0920    Subjective  Pt stated LBP 3/10 on Rt side also c/o deep achey Lt shoulder pain 8/10- feels related to stress.    Patient Stated Goals  get some exercises to do at home to help relieve the pain    Currently in Pain?  Yes    Pain Score  3     Pain Location  Back    Pain Orientation  Right;Lower    Pain Descriptors / Indicators  Aching   Deep ache   Pain Type  Chronic pain    Pain Onset  More than a month ago    Pain Frequency  Constant    Aggravating Factors   bending to floor to retrieve objects, standing, walking, washing dishes, vacuum    Pain Relieving Factors  Lay on the couch, tramadol or tylenol at night    Effect of Pain on Daily Activities  increased  [facility] PT Treatment/Exercise - 01/27/19 0001      Exercises   Exercises  Lumbar      Lumbar Exercises: Seated   Sit to Stand  10 reps    Sit to Stand Limitations  no HHA, eccentric control      Lumbar Exercises: Supine   Ab Set  10 reps;3 seconds    AB Set Limitations  cueing for abdominal sets    Clam  5 reps;3 seconds    Clam Limitations  RTB, cueing for stability (c/o Lt thigh pain)    Bent Knee Raise  10 reps    Bent Knee Raise Limitations  with ab set             PT Education - 01/27/19 0945    Education Details  Reviewed goals, FOTO complete, educated bed mobility (log rolling) and importance of abdominal strength to support back       PT Short Term Goals - 01/26/19 1238      PT SHORT TERM GOAL #1   Title  Pt will independently perform HEP with proper technique to improve strength and reduce pain.    Time  1    Period  Weeks    Status  New    Target Date  02/02/19        PT Long Term Goals - 01/26/19 1239      PT LONG TERM GOAL #1   Title  Pt  will improve lumbar AROM to [facility] in all directions, without increase in LBP.    Time  2    Period  Weeks    Status  New    Target Date  02/09/19      PT LONG TERM GOAL #2   Title  Pt will improve L SLS to 30 sec without Trendelenburg to reduce risk for falls and demo improved L glute functional strength.    Time  2    Period  Weeks    Status  New      PT LONG TERM GOAL #3   Title  Pt will improve BLE strength by 1/2 MMT grade to improve ability to perform functional tasks at home without difficulty.    Time  2    Period  Weeks    Status  New      PT LONG TERM GOAL #4   Title  Pt will report sleeping 6 hours in bed with <3 awakenings and <5/10 LBP to improve QoL.    Time  2    Period  Weeks    Status  New            Plan - 01/27/19 1257    Clinical Impression Statement  Reviewed goals abd FOTO complete.  Assured compliance wiht HEP with min cueing for form with bridges and encouraged to continue breathing through all exercises.  Pt educated on bed mobility (log rolling) to reduce stress on lower back as well as importance of abdominal strengthening/activaiton to assist with lower back pain.  Added supine abdominal exercises incorporated with counting to improve breathing during exercise, pt able to demonstrate correct form and given additional exercises to HEP.  Pt c/o Lt shoulder pain through session, discussed heat for pain control.    Personal Factors and Comorbidities  Age;Comorbidity 2;Past/Current Experience;Time since onset of injury/illness/exacerbation    Comorbidities  see above    Examination-Activity Limitations  Bed Mobility;Bend;Locomotion Level;Sleep;Stand    Examination-Participation Restrictions  Cleaning;Laundry    Stability/Clinical Decision Making  Stable/Uncomplicated    Clinical Decision Making  Low    Rehab Potential  Good    PT Frequency  2x / week    PT Duration  2 weeks    PT Treatment/Interventions  ADLs/Self Care Home Management;Aquatic  Therapy;Biofeedback;Cryotherapy;Electrical Stimulation;Moist Heat;Traction;Ultrasound;Gait training;Stair training;Functional mobility training;Therapeutic activities;Therapeutic exercise;Balance training;Neuromuscular re-education;Patient/family education;Orthotic Fit/Training;Manual techniques;Passive range of motion;Dry needling;Taping;Spinal Manipulations;Joint Manipulations    PT Next Visit Plan  Next session begin SLS and glut med strengthening exercises.  Update HEP each visit. Strengthen BLE, core, and postural muscles; QL doorframe stretch, lumbar/thoracic mobility exercises. Stretching as needed to improve muscle extensiblity and relieve pain. Manual STM as needed for pain relief.    PT Home Exercise Plan  Eval: STS, bridges; 7/28: bent knee raise with ab set       Patient will benefit from skilled therapeutic intervention in order to improve the following deficits and impairments:  Abnormal gait, Decreased activity tolerance, Decreased balance, Decreased endurance, Decreased range of motion, Decreased strength, Increased muscle spasms, Improper body mechanics, Pain  Visit Diagnosis: 1. Chronic bilateral low back pain without sciatica   2. Muscle weakness (generalized)        Problem List Patient Active Problem List   Diagnosis Date Noted  . DOE (dyspnea on exertion) 12/09/2018  . Tachycardia 08/29/2017  . Periscapular pain 08/29/2017  . Primary osteoarthritis of right knee 12/04/2016  . OA (osteoarthritis) of knee 12/03/2016  . LLQ abdominal pain 03/15/2016  . Lateral meniscal tear 10/19/2015  . Left hip pain 07/22/2013  . Occipital neuralgia 06/04/2013  . Leg pain 02/22/2012  . Gout 02/22/2012  . Weight gain 01/11/2012  . Bilateral leg edema 06/07/2011  . Pruritus 04/23/2011  . Well adult exam 04/23/2011  . Pneumonia, organism unspecified(486) 04/21/2011  . Cough 04/19/2011  . Paresthesia 09/26/2010  . NECK PAIN 08/22/2010  . BACK PAIN, THORACIC REGION 08/22/2010  .  Low back pain 08/22/2010  . DIZZINESS 08/22/2010  . Cerumen impaction 07/21/2010  . CRAMP OF LIMB 03/16/2010  . BRONCHITIS, ACUTE 07/22/2009  . COPD 07/22/2009  . MEDIASTINAL LYMPHADENOPATHY 08/16/2008  . Nonspecific (abnormal) findings on radiological and other examination of body structure 08/16/2008  . CT, CHEST, ABNORMAL 08/16/2008  . BRADYCARDIA 06/23/2008  . GERD 06/23/2008  . SYNCOPE 06/23/2008  . B12 deficiency 04/09/2008  . Pain in Soft Tissues of Limb 01/08/2008  . RASH AND OTHER NONSPECIFIC SKIN ERUPTION 01/08/2008  . SINUSITIS, ACUTE 10/31/2007  . HIP PAIN 10/31/2007  . Essential hypertension 04/21/2007  . MENOPAUSAL SYNDROME 04/21/2007  . ALLERGIC RHINITIS 04/19/2007  . COLONIC POLYPS, HX OF 04/19/2007   Ihor Austin, LPTA; [place]  Aldona Lento 01/27/2019, 1:04 PM  [place], [place], [address] Phone: [phone]   Fax:  [phone]  Name: Courtney Nielsen MRN: [id number] Date of Birth: [birth year]

## 2019-01-27 NOTE — Addendum Note (Signed)
Addended by: Laneta Simmers B on: 01/27/2019 08:48 AM   Modules accepted: Orders

## 2019-01-27 NOTE — Patient Instructions (Signed)
Small Ball Marching Supine    Lie supine with small ball under tailbone, knees flexed. Raise one leg a few inches. Hold briefly, return and raise other leg. Keep hips stationary. No need for ball under back. Do 2 sets of 10 repetitions.  Copyright  VHI. All rights reserved.

## 2019-02-02 ENCOUNTER — Encounter (HOSPITAL_COMMUNITY): Payer: Self-pay

## 2019-02-02 ENCOUNTER — Ambulatory Visit (HOSPITAL_COMMUNITY): Payer: BC Managed Care – PPO | Attending: Anesthesiology

## 2019-02-02 ENCOUNTER — Other Ambulatory Visit: Payer: Self-pay

## 2019-02-02 DIAGNOSIS — G8929 Other chronic pain: Secondary | ICD-10-CM | POA: Diagnosis not present

## 2019-02-02 DIAGNOSIS — M6281 Muscle weakness (generalized): Secondary | ICD-10-CM | POA: Diagnosis not present

## 2019-02-02 DIAGNOSIS — M545 Low back pain: Secondary | ICD-10-CM | POA: Diagnosis not present

## 2019-02-02 NOTE — Therapy (Signed)
[facility], [place], [address] Phone: [phone]   Fax:  [phone]  Physical Therapy Treatment  Patient Details  Name: Courtney Nielsen MRN: [id number] Date of Birth: 06-28-1956 Referring Provider (PT): Clydell Hakim, MD   Encounter Date: 02/02/2019  PT End of Session - 02/02/19 0829    Visit Number  3    Number of Visits  4    Date for PT Re-Evaluation  02/09/19    Authorization Type  [employer] PPO    Authorization Time Period  01/26/19 to 02/09/19    Authorization - Visit Number  3    Authorization - Number of Visits  60   60 visit max limit for combined PT/OT/SLP   PT Start Time  0820   pt arrived late   PT Stop Time  0902    PT Time Calculation (min)  42 min    Activity Tolerance  Patient tolerated treatment well;Patient limited by pain    Behavior During Therapy  [facility] for tasks assessed/performed       Past Medical History:  Diagnosis Date  . Allergic rhinitis   . Allergy   . Arthritis   . Cataract    right eye removed with lens implant   . Chest pain   . Colonic polyp   . COPD (chronic obstructive pulmonary disease) ([place])    patient denies at preop on ;   . Dysrhythmia    hx of every third beat is " off" per patient   . Hypertension   . Pneumonia    hx of 2014   . Smoker   . Vitamin B12 deficiency     Past Surgical History:  Procedure Laterality Date  . CARDIAC CATHETERIZATION     cath negative per patient   . CATARACT EXTRACTION W/PHACO Right 06/11/2016   Procedure: CATARACT EXTRACTION PHACO AND INTRAOCULAR LENS PLACEMENT RIGHT EYE CDE=6.15;  Surgeon: Tonny Branch, MD;  Location: [facility];  Service: Ophthalmology;  Laterality: Right;  right  . COLONOSCOPY    . KNEE ARTHROSCOPY Right 10/19/2015   Procedure: RIGHT KNEE SCOPE WITH MENISCAL DEBRIDEMENT,LATERAL SYNOVECTOMY;  Surgeon: Gaynelle Arabian, MD;  Location: [facility];  Service: Orthopedics;  Laterality: Right;  . POLYPECTOMY    . TOTAL KNEE  ARTHROPLASTY Right 12/03/2016   Procedure: RIGHT TOTAL KNEE ARTHROPLASTY;  Surgeon: Gaynelle Arabian, MD;  Location: [facility];  Service: Orthopedics;  Laterality: Right;    There were no vitals filed for this visit.  Subjective Assessment - 02/02/19 0827    Subjective  Pt reports 7/10 LBP today. Pt reports sleeping was rough last night. Pt reports R side ablation was rescheduled for 8/26.    Limitations  Lifting    How long can you sit comfortably?  1 hour, shifting weight then stands to walk around after 1 hour    How long can you stand comfortably?  10-15 minutes before needing to lean forward onto something    How long can you walk comfortably?  10-15 minutes before needing to lean forward onto something    Diagnostic tests  none recently    Patient Stated Goals  get some exercises to do at home to help relieve the pain    Currently in Pain?  Yes    Pain Score  7     Pain Location  Back   and L hip   Pain Orientation  Lower    Pain Descriptors / Indicators  Aching  Pain Type  Chronic pain    Pain Onset  More than a month ago    Pain Frequency  Constant    Aggravating Factors   bending to floor to retrieve objects, standing, walking, washing dishes, vacuum    Pain Relieving Factors  Lay on the couch, tramadol or tylenol at night    Effect of Pain on Daily Activities  increased          [facility] Adult PT Treatment/Exercise - 02/02/19 0001      Lumbar Exercises: Stretches   Lower Trunk Rotation Limitations  10x10 sec hold    Other Lumbar Stretch Exercise  QL in doorway, 3x20 sec      Lumbar Exercises: Standing   Other Standing Lumbar Exercises  3 way Hip, x10 reps each, RTB      Lumbar Exercises: Supine   Clam  --    Clam Limitations  --    Bent Knee Raise  15 reps    Bent Knee Raise Limitations  with ab set    Bridge  15 reps      Lumbar Exercises: Sidelying   Clam  Both;15 reps    Clam Limitations  RTB    Other Sidelying Lumbar Exercises  reverse clams, x15 reps BLE              PT Education - 02/02/19 0828    Education Details  Exercise technique, updated HEP    Person(s) Educated  Patient    Methods  Explanation;Demonstration;Handout    Comprehension  Verbalized understanding;Returned demonstration       PT Short Term Goals - 02/02/19 0906      PT SHORT TERM GOAL #1   Title  Pt will independently perform HEP with proper technique to improve strength and reduce pain.    Time  1    Period  Weeks    Status  On-going    Target Date  02/02/19        PT Long Term Goals - 02/02/19 0906      PT LONG TERM GOAL #1   Title  Pt will improve lumbar AROM to [facility] in all directions, without increase in LBP.    Time  2    Period  Weeks    Status  On-going      PT LONG TERM GOAL #2   Title  Pt will improve L SLS to 30 sec without Trendelenburg to reduce risk for falls and demo improved L glute functional strength.    Time  2    Period  Weeks    Status  On-going      PT LONG TERM GOAL #3   Title  Pt will improve BLE strength by 1/2 MMT grade to improve ability to perform functional tasks at home without difficulty.    Time  2    Period  Weeks    Status  On-going      PT LONG TERM GOAL #4   Title  Pt will report sleeping 6 hours in bed with <3 awakenings and <5/10 LBP to improve QoL.    Time  2    Period  Weeks    Status  On-going            Plan - 02/02/19 8366    Clinical Impression Statement  Continued with pt's established POC this date. Pt with increased L hip pain with clams, only tolerating 10 reps versus 15 on the R side. Pt also with increased L  hip pain with hip flexor testing and occasional "grabbing" sensation with LTR. Pt continues to verbalize pain with exercises requiring verbal cues to decreased ROM to pain-free to engage muscle strengthening without aggravating pain. Progressed with SLS strengthening with RTB for added resistance. Pt c/o L hip pain with standing exercises and denies LBP. Educated pt on HEP update and  issued RTB this date. Continue to progress as able.    Personal Factors and Comorbidities  Age;Comorbidity 2;Past/Current Experience;Time since onset of injury/illness/exacerbation    Comorbidities  see above    Examination-Activity Limitations  Bed Mobility;Bend;Locomotion Level;Sleep;Stand    Examination-Participation Restrictions  Cleaning;Laundry    Stability/Clinical Decision Making  Stable/Uncomplicated    Rehab Potential  Good    PT Frequency  2x / week    PT Duration  2 weeks    PT Treatment/Interventions  ADLs/Self Care Home Management;Aquatic Therapy;Biofeedback;Cryotherapy;Electrical Stimulation;Moist Heat;Traction;Ultrasound;Gait training;Stair training;Functional mobility training;Therapeutic activities;Therapeutic exercise;Balance training;Neuromuscular re-education;Patient/family education;Orthotic Fit/Training;Manual techniques;Passive range of motion;Dry needling;Taping;Spinal Manipulations;Joint Manipulations    PT Next Visit Plan  Finalize HEP and d/c. Progress standing strengthening. Initiate quadruped strengthening. Update HEP each visit. Strengthen BLE, core, and postural muscles; QL doorframe stretch, lumbar/thoracic mobility exercises. Stretching as needed to improve muscle extensiblity and relieve pain. Manual STM as needed for pain relief.    PT Home Exercise Plan  Eval: STS, bridges; 7/28: bent knee raise with ab set; 8/3: 3 way hip RTB, clams with RTB, reverse clams, LTR, QL stretch in doorway    Consulted and Agree with Plan of Care  Patient       Patient will benefit from skilled therapeutic intervention in order to improve the following deficits and impairments:  Abnormal gait, Decreased activity tolerance, Decreased balance, Decreased endurance, Decreased range of motion, Decreased strength, Increased muscle spasms, Improper body mechanics, Pain  Visit Diagnosis: 1. Chronic bilateral low back pain without sciatica   2. Muscle weakness (generalized)         Problem List Patient Active Problem List   Diagnosis Date Noted  . DOE (dyspnea on exertion) 12/09/2018  . Tachycardia 08/29/2017  . Periscapular pain 08/29/2017  . Primary osteoarthritis of right knee 12/04/2016  . OA (osteoarthritis) of knee 12/03/2016  . LLQ abdominal pain 03/15/2016  . Lateral meniscal tear 10/19/2015  . Left hip pain 07/22/2013  . Occipital neuralgia 06/04/2013  . Leg pain 02/22/2012  . Gout 02/22/2012  . Weight gain 01/11/2012  . Bilateral leg edema 06/07/2011  . Pruritus 04/23/2011  . Well adult exam 04/23/2011  . Pneumonia, organism unspecified(486) 04/21/2011  . Cough 04/19/2011  . Paresthesia 09/26/2010  . NECK PAIN 08/22/2010  . BACK PAIN, THORACIC REGION 08/22/2010  . Low back pain 08/22/2010  . DIZZINESS 08/22/2010  . Cerumen impaction 07/21/2010  . CRAMP OF LIMB 03/16/2010  . BRONCHITIS, ACUTE 07/22/2009  . COPD 07/22/2009  . MEDIASTINAL LYMPHADENOPATHY 08/16/2008  . Nonspecific (abnormal) findings on radiological and other examination of body structure 08/16/2008  . CT, CHEST, ABNORMAL 08/16/2008  . BRADYCARDIA 06/23/2008  . GERD 06/23/2008  . SYNCOPE 06/23/2008  . B12 deficiency 04/09/2008  . Pain in Soft Tissues of Limb 01/08/2008  . RASH AND OTHER NONSPECIFIC SKIN ERUPTION 01/08/2008  . SINUSITIS, ACUTE 10/31/2007  . HIP PAIN 10/31/2007  . Essential hypertension 04/21/2007  . MENOPAUSAL SYNDROME 04/21/2007  . ALLERGIC RHINITIS 04/19/2007  . COLONIC POLYPS, HX OF 04/19/2007      Talbot Grumbling PT, DPT 02/02/19, 9:08 AM [facility]  [address] [place], [place], [address] Phone: [phone]   Fax:  [phone]  Name: Courtney Nielsen MRN: [id number] Date of Birth: 1955/11/28

## 2019-02-05 ENCOUNTER — Encounter (HOSPITAL_COMMUNITY): Payer: Self-pay

## 2019-02-05 ENCOUNTER — Other Ambulatory Visit: Payer: Self-pay

## 2019-02-05 ENCOUNTER — Ambulatory Visit (HOSPITAL_COMMUNITY): Payer: BC Managed Care – PPO

## 2019-02-05 DIAGNOSIS — M6281 Muscle weakness (generalized): Secondary | ICD-10-CM | POA: Diagnosis not present

## 2019-02-05 DIAGNOSIS — G8929 Other chronic pain: Secondary | ICD-10-CM | POA: Diagnosis not present

## 2019-02-05 DIAGNOSIS — M545 Low back pain: Secondary | ICD-10-CM | POA: Diagnosis not present

## 2019-02-05 NOTE — Therapy (Signed)
[facility] [address] [place], [place], [address] Phone: [phone]   Fax:  [phone]   PHYSICAL THERAPY DISCHARGE SUMMARY  Visits from Start of Care: 4  Current functional level related to goals / functional outcomes: See below   Remaining deficits: See below   Education / Equipment: See below  Plan: Patient agrees to discharge.  Patient goals were partially met. Patient is being discharged due to being pleased with the current functional level.  ?????       Physical Therapy Treatment  Patient Details  Name: Courtney Nielsen MRN: [id number] Date of Birth: Nov 12, 1955 Referring Provider (PT): Clydell Hakim, MD   Encounter Date: 02/05/2019  PT End of Session - 02/05/19 0921    Visit Number  4    Number of Visits  4    Date for PT Re-Evaluation  02/09/19    Authorization Type  [employer] PPO    Authorization Time Period  01/26/19 to 02/09/19    Authorization - Visit Number  4    Authorization - Number of Visits  60   60 visit max limit for combined PT/OT/SLP   PT Start Time  0917    PT Stop Time  0940    PT Time Calculation (min)  23 min    Activity Tolerance  Patient tolerated treatment well    Behavior During Therapy  [facility] for tasks assessed/performed       Past Medical History:  Diagnosis Date  . Allergic rhinitis   . Allergy   . Arthritis   . Cataract    right eye removed with lens implant   . Chest pain   . Colonic polyp   . COPD (chronic obstructive pulmonary disease) ([place])    patient denies at preop on ;   . Dysrhythmia    hx of every third beat is " off" per patient   . Hypertension   . Pneumonia    hx of 2014   . Smoker   . Vitamin B12 deficiency     Past Surgical History:  Procedure Laterality Date  . CARDIAC CATHETERIZATION     cath negative per patient   . CATARACT EXTRACTION W/PHACO Right 06/11/2016   Procedure: CATARACT EXTRACTION PHACO AND INTRAOCULAR LENS PLACEMENT RIGHT EYE CDE=6.15;   Surgeon: Tonny Branch, MD;  Location: [facility];  Service: Ophthalmology;  Laterality: Right;  right  . COLONOSCOPY    . KNEE ARTHROSCOPY Right 10/19/2015   Procedure: RIGHT KNEE SCOPE WITH MENISCAL DEBRIDEMENT,LATERAL SYNOVECTOMY;  Surgeon: Gaynelle Arabian, MD;  Location: [facility];  Service: Orthopedics;  Laterality: Right;  . POLYPECTOMY    . TOTAL KNEE ARTHROPLASTY Right 12/03/2016   Procedure: RIGHT TOTAL KNEE ARTHROPLASTY;  Surgeon: Gaynelle Arabian, MD;  Location: [facility];  Service: Orthopedics;  Laterality: Right;    There were no vitals filed for this visit.  Subjective Assessment - 02/05/19 0917    Subjective  Pt rpeorts she was in so much pain after therapy on Monday that she could hardly move and she is still sore today. Pt reports improvement in pain with LTRs. Pt reports same pain with all funcitonal mobility since starting PT. Pt reports she feels capable to perform exercises at home to continue strengthening until the spinal ablation at the end of this month and verbalizes agreement to be discharged.    Limitations  Lifting    How long can you sit comfortably?  1 hour, shifting weight then stands to walk around  after 1 hour    How long can you stand comfortably?  10-15 minutes before needing to lean forward onto something    How long can you walk comfortably?  10-15 minutes before needing to lean forward onto something    Diagnostic tests  none recently    Patient Stated Goals  get some exercises to do at home to help relieve the pain    Currently in Pain?  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   No    Is the patient reluctant to leave their home because of a fear of falling?   No      Functional Tests   Functional tests  Sit to Stand      Sit to Stand   Comments  5x STS: 8.65 sec, no UE, from chair   was 10.4 sec     ROM / Strength   AROM / PROM / Strength  AROM;Strength      AROM   AROM Assessment Site  Lumbar    Lumbar Flexion  8" from floor   was 10" from floor   Lumbar Extension  50% limited   was 50% limited   Lumbar - Right Side Bend  2" past joint line   was knee joint line   Lumbar - Left Side Bend  joint line   was 1" from knee jt line   Lumbar - Right Rotation  WNL   was 25%   Lumbar - Left Rotation  WNL   was WNL     Strength   Strength Assessment Site  Hip;Knee;Ankle    Right Hip Flexion  5/5   was 4   Right Hip Extension  4+/5   was 4   Right Hip ABduction  4+/5   was 4   Left Hip Flexion  5/5   was 4   Left Hip Extension  4+/5   was 4   Left Hip ABduction  4+/5   was 4   Right Knee Extension  5/5   was 5   Left Knee Extension  5/5  was 5   Right Ankle Dorsiflexion  5/5   was 5   Left Ankle Dorsiflexion  5/5   was 5     Palpation   Spinal mobility  denies increase in pain with palpation   was Tenderness throughout L1-5; pt reports "uncomfortable" a   Palpation comment  denies increase in pain with palpation   was Denies tenderness/pain throughout  bil lumbar and gluteal     Special Tests    Special Tests  Lumbar    Lumbar Tests  Slump Test;Straight Leg Raise      Slump test   Findings  Negative    Comment  bil      Straight Leg Raise   Findings  Negative    Comment  bil      Ambulation/Gait   Gait Comments  --      Balance   Balance Assessed  Yes      Static Standing Balance   Static Standing - Balance Support  No upper extremity supported    Static Standing Balance -  Activities   Single Leg Stance - Right Leg;Single Leg Stance - Left Leg    Static Standing - Comment/# of Minutes  R: 45+ sec, L: 45+ sec   was R: 18 sec, L: 45+ sec           PT Education - 02/05/19 0921    Education Details  Reassessment findings, discharged to HEP    Person(s) Educated  Patient    Methods  Explanation    Comprehension  Verbalized understanding       PT Short Term Goals - 02/05/19 0921      PT SHORT TERM GOAL #1   Title  Pt will independently perform HEP with proper technique to improve strength and reduce pain.    Baseline  8/6: reports daily compliance    Time  1    Period  Weeks    Status  Achieved    Target Date  02/02/19        PT Long Term Goals - 02/05/19 4193      PT LONG TERM GOAL #1   Title  Pt will improve lumbar AROM to [facility] in all directions, without increase in LBP.    Baseline  8/6: see MMT    Time  2    Period  Weeks    Status  Not Met      PT LONG TERM GOAL #2   Title  Pt will improve L SLS to 30 sec without Trendelenburg to reduce risk for falls and demo improved L glute functional strength.    Baseline  8/6: 45+ sec bil    Time  2    Period  Weeks    Status  Achieved      PT LONG TERM GOAL #3   Title  Pt will improve BLE strength by 1/2 MMT grade to improve ability to perform functional tasks at home without difficulty.    Baseline  8/6: see MMT    Time  2    Period  Weeks    Status  Achieved      PT LONG TERM GOAL #4   Title  Pt will report sleeping 6 hours in bed with <3  awakenings and <5/10 LBP to improve QoL.    Baseline  8/6: no change, 3 hours in bed and 3 hours in recliner, waking constantly with 8/10 pain    Time  2    Period  Weeks    Status  Not Met            Plan - 02/05/19 0944    Clinical Impression Statement  Pt due for reassessment this date of goals and other objective measures. Objectively, pt with improvements in functional strength and balance AEB performing R and L SLS times equally and improving 5x STS to within age related norms. Pt with improvement throughout BLE strength per MMT and minimal improvement in lumbar AROM, with varying improvements in pain with range. Pt verbalizes confidence to perform exercises at home due to subjectively reporting no changes in pain at home with activities or sleep. Pt is appropriate to d/c this date to HEP and educated to retrieve new referral after ablation procedures if wanting to reattempt therapy for strengthening once pain is under control; pt verbalized understanding.    Personal Factors and Comorbidities  Age;Comorbidity 2;Past/Current Experience;Time since onset of injury/illness/exacerbation    Comorbidities  see above    Examination-Activity Limitations  Bed Mobility;Bend;Locomotion Level;Sleep;Stand    Examination-Participation Restrictions  Cleaning;Laundry    Stability/Clinical Decision Making  Stable/Uncomplicated    Rehab Potential  Good    PT Frequency  2x / week    PT Duration  2 weeks    PT Treatment/Interventions  ADLs/Self Care Home Management;Aquatic Therapy;Biofeedback;Cryotherapy;Electrical Stimulation;Moist Heat;Traction;Ultrasound;Gait training;Stair training;Functional mobility training;Therapeutic activities;Therapeutic exercise;Balance training;Neuromuscular re-education;Patient/family education;Orthotic Fit/Training;Manual techniques;Passive range of motion;Dry needling;Taping;Spinal Manipulations;Joint Manipulations    PT Next Visit Plan  Discharged to HEP.    PT Home  Exercise Plan  Eval: STS, bridges; 7/28: bent knee raise with ab set; 8/3: 3 way hip RTB, clams with RTB, reverse clams, LTR, QL stretch in doorway    Consulted and Agree with Plan of Care  Patient       Patient will benefit from skilled therapeutic intervention in order to improve the following deficits and impairments:  Abnormal gait, Decreased activity tolerance, Decreased balance, Decreased endurance, Decreased range of motion, Decreased strength, Increased muscle spasms, Improper body mechanics, Pain  Visit Diagnosis: 1. Chronic bilateral low back pain without sciatica   2. Muscle weakness (generalized)        Problem List Patient Active Problem List   Diagnosis Date Noted  . DOE (dyspnea on exertion) 12/09/2018  . Tachycardia 08/29/2017  . Periscapular pain 08/29/2017  . Primary osteoarthritis of right knee 12/04/2016  . OA (osteoarthritis) of knee 12/03/2016  . LLQ abdominal pain 03/15/2016  . Lateral meniscal tear 10/19/2015  . Left hip pain 07/22/2013  . Occipital neuralgia 06/04/2013  . Leg pain 02/22/2012  . Gout 02/22/2012  . Weight gain 01/11/2012  . Bilateral leg edema 06/07/2011  . Pruritus 04/23/2011  . Well adult exam 04/23/2011  . Pneumonia, organism unspecified(486) 04/21/2011  . Cough 04/19/2011  . Paresthesia 09/26/2010  . NECK PAIN 08/22/2010  . BACK PAIN, THORACIC REGION 08/22/2010  . Low back pain 08/22/2010  . DIZZINESS 08/22/2010  . Cerumen impaction 07/21/2010  . CRAMP OF LIMB 03/16/2010  . BRONCHITIS, ACUTE 07/22/2009  . COPD 07/22/2009  . MEDIASTINAL LYMPHADENOPATHY 08/16/2008  . Nonspecific (abnormal) findings on radiological and other examination of body structure 08/16/2008  . CT, CHEST, ABNORMAL 08/16/2008  . BRADYCARDIA 06/23/2008  . GERD 06/23/2008  . SYNCOPE 06/23/2008  . B12 deficiency 04/09/2008  . Pain in Soft Tissues of Limb 01/08/2008  . RASH AND OTHER NONSPECIFIC SKIN ERUPTION 01/08/2008  . SINUSITIS, ACUTE 10/31/2007  .  HIP PAIN 10/31/2007  . Essential hypertension 04/21/2007  . MENOPAUSAL  SYNDROME 04/21/2007  . ALLERGIC RHINITIS 04/19/2007  . COLONIC POLYPS, HX OF 04/19/2007      Talbot Grumbling PT, DPT 02/05/19, 9:50 AM [place], [place], [address] Phone: [phone]   Fax:  [phone]  Name: Courtney Nielsen MRN: [id number] Date of Birth: October 25, 1955

## 2019-02-19 DIAGNOSIS — H43812 Vitreous degeneration, left eye: Secondary | ICD-10-CM | POA: Diagnosis not present

## 2019-02-19 DIAGNOSIS — H348321 Tributary (branch) retinal vein occlusion, left eye, with retinal neovascularization: Secondary | ICD-10-CM | POA: Diagnosis not present

## 2019-02-19 DIAGNOSIS — H4312 Vitreous hemorrhage, left eye: Secondary | ICD-10-CM | POA: Diagnosis not present

## 2019-02-19 DIAGNOSIS — H43392 Other vitreous opacities, left eye: Secondary | ICD-10-CM | POA: Diagnosis not present

## 2019-02-25 DIAGNOSIS — M47816 Spondylosis without myelopathy or radiculopathy, lumbar region: Secondary | ICD-10-CM | POA: Diagnosis not present

## 2019-03-05 DIAGNOSIS — M47816 Spondylosis without myelopathy or radiculopathy, lumbar region: Secondary | ICD-10-CM | POA: Diagnosis not present

## 2019-03-09 ENCOUNTER — Other Ambulatory Visit: Payer: Self-pay | Admitting: Internal Medicine

## 2019-03-10 NOTE — Telephone Encounter (Signed)
[place] Controlled Database Checked Last filled: 12/29/18 # 30 LOV w/you: 12/09/18 Next appt w/you: 04/13/19

## 2019-03-18 ENCOUNTER — Other Ambulatory Visit: Payer: Self-pay

## 2019-03-18 ENCOUNTER — Other Ambulatory Visit (INDEPENDENT_AMBULATORY_CARE_PROVIDER_SITE_OTHER): Payer: BC Managed Care – PPO

## 2019-03-18 DIAGNOSIS — R300 Dysuria: Secondary | ICD-10-CM | POA: Diagnosis not present

## 2019-03-19 LAB — URINALYSIS, ROUTINE W REFLEX MICROSCOPIC
Bilirubin Urine: NEGATIVE
Ketones, ur: NEGATIVE
Leukocytes,Ua: NEGATIVE
Nitrite: NEGATIVE
Specific Gravity, Urine: >=1.03 — AB (ref 1.000–1.030)
Total Protein, Urine: NEGATIVE
Urine Glucose: NEGATIVE
Urobilinogen, UA: 0.2 (ref 0.0–1.0)
WBC, UA: NONE SEEN (ref 0–?)
pH: 6 (ref 5.0–8.0)

## 2019-03-19 LAB — CULTURE, URINE COMPREHENSIVE
MICRO NUMBER:: 888370
SPECIMEN QUALITY:: ADEQUATE

## 2019-03-20 ENCOUNTER — Ambulatory Visit: Payer: BC Managed Care – PPO | Admitting: Family

## 2019-03-22 ENCOUNTER — Other Ambulatory Visit: Payer: Self-pay | Admitting: Internal Medicine

## 2019-03-22 MED ORDER — OXYBUTYNIN CHLORIDE ER 5 MG PO TB24: 5.0000 mg | ORAL_TABLET | Freq: Every day | ORAL | 1 refills | Status: DC

## 2019-03-23 ENCOUNTER — Other Ambulatory Visit: Payer: Self-pay

## 2019-03-23 ENCOUNTER — Encounter: Payer: Self-pay | Admitting: Internal Medicine

## 2019-03-23 DIAGNOSIS — Z0000 Encounter for general adult medical examination without abnormal findings: Secondary | ICD-10-CM

## 2019-04-03 ENCOUNTER — Other Ambulatory Visit: Payer: Self-pay | Admitting: Internal Medicine

## 2019-04-08 ENCOUNTER — Other Ambulatory Visit (INDEPENDENT_AMBULATORY_CARE_PROVIDER_SITE_OTHER): Payer: BC Managed Care – PPO

## 2019-04-08 DIAGNOSIS — Z0000 Encounter for general adult medical examination without abnormal findings: Secondary | ICD-10-CM

## 2019-04-08 LAB — CBC WITH DIFFERENTIAL/PLATELET
Basophils Absolute: 0.1 10*3/uL (ref 0.0–0.1)
Basophils Relative: 1 % (ref 0.0–3.0)
Eosinophils Absolute: 0.2 10*3/uL (ref 0.0–0.7)
Eosinophils Relative: 2.6 % (ref 0.0–5.0)
HCT: 40.6 % (ref 36.0–46.0)
Hemoglobin: 13.7 g/dL (ref 12.0–15.0)
Lymphocytes Relative: 37 % (ref 12.0–46.0)
Lymphs Abs: 3.1 10*3/uL (ref 0.7–4.0)
MCHC: 33.7 g/dL (ref 30.0–36.0)
MCV: 93.3 fl (ref 78.0–100.0)
Monocytes Absolute: 0.5 10*3/uL (ref 0.1–1.0)
Monocytes Relative: 6.5 % (ref 3.0–12.0)
Neutro Abs: 4.5 10*3/uL (ref 1.4–7.7)
Neutrophils Relative %: 52.9 % (ref 43.0–77.0)
Platelets: 293 10*3/uL (ref 150.0–400.0)
RBC: 4.35 Mil/uL (ref 3.87–5.11)
RDW: 14.2 % (ref 11.5–15.5)
WBC: 8.4 10*3/uL (ref 4.0–10.5)

## 2019-04-08 LAB — BASIC METABOLIC PANEL
BUN: 11 mg/dL (ref 6–23)
CO2: 27 mEq/L (ref 19–32)
Calcium: 9.1 mg/dL (ref 8.4–10.5)
Chloride: 107 mEq/L (ref 96–112)
Creatinine, Ser: 1.04 mg/dL (ref 0.40–1.20)
GFR: 53.51 mL/min — ABNORMAL LOW (ref >60.00)
Glucose, Bld: 89 mg/dL (ref 70–99)
Potassium: 3.7 mEq/L (ref 3.5–5.1)
Sodium: 144 mEq/L (ref 135–145)

## 2019-04-08 LAB — TSH: TSH: 1.02 u[IU]/mL (ref 0.35–4.50)

## 2019-04-08 LAB — HEPATIC FUNCTION PANEL
ALT: 19 U/L (ref 0–35)
AST: 13 U/L (ref 0–37)
Albumin: 3.7 g/dL (ref 3.5–5.2)
Alkaline Phosphatase: 77 U/L (ref 39–117)
Bilirubin, Direct: 0.1 mg/dL (ref 0.0–0.3)
Total Bilirubin: 0.7 mg/dL (ref 0.2–1.2)
Total Protein: 6.5 g/dL (ref 6.0–8.3)

## 2019-04-08 LAB — LIPID PANEL
Cholesterol: 193 mg/dL (ref 0–200)
HDL: 45.9 mg/dL (ref >39.00)
LDL Cholesterol: 128 mg/dL — ABNORMAL HIGH (ref 0–99)
NonHDL: 147.37
Total CHOL/HDL Ratio: 4
Triglycerides: 98 mg/dL (ref 0.0–149.0)
VLDL: 19.6 mg/dL (ref 0.0–40.0)

## 2019-04-08 LAB — URINALYSIS
Bilirubin Urine: NEGATIVE
Ketones, ur: NEGATIVE
Leukocytes,Ua: NEGATIVE
Nitrite: NEGATIVE
Specific Gravity, Urine: 1.02 (ref 1.000–1.030)
Total Protein, Urine: NEGATIVE
Urine Glucose: NEGATIVE
Urobilinogen, UA: 0.2 (ref 0.0–1.0)
pH: 6.5 (ref 5.0–8.0)

## 2019-04-13 ENCOUNTER — Encounter: Payer: BLUE CROSS/BLUE SHIELD | Admitting: Internal Medicine

## 2019-04-15 ENCOUNTER — Encounter: Payer: BC Managed Care – PPO | Admitting: Internal Medicine

## 2019-04-15 ENCOUNTER — Ambulatory Visit (INDEPENDENT_AMBULATORY_CARE_PROVIDER_SITE_OTHER): Payer: BC Managed Care – PPO | Admitting: Internal Medicine

## 2019-04-15 ENCOUNTER — Encounter: Payer: Self-pay | Admitting: Internal Medicine

## 2019-04-15 ENCOUNTER — Other Ambulatory Visit: Payer: Self-pay

## 2019-04-15 VITALS — BP 144/88 | HR 83 | Temp 97.6°F | Ht 70.5 in | Wt 237.0 lb

## 2019-04-15 DIAGNOSIS — Z23 Encounter for immunization: Secondary | ICD-10-CM | POA: Diagnosis not present

## 2019-04-15 DIAGNOSIS — I10 Essential (primary) hypertension: Secondary | ICD-10-CM | POA: Diagnosis not present

## 2019-04-15 DIAGNOSIS — Z0000 Encounter for general adult medical examination without abnormal findings: Secondary | ICD-10-CM | POA: Diagnosis not present

## 2019-04-15 DIAGNOSIS — E785 Hyperlipidemia, unspecified: Secondary | ICD-10-CM | POA: Diagnosis not present

## 2019-04-15 DIAGNOSIS — E538 Deficiency of other specified B group vitamins: Secondary | ICD-10-CM | POA: Diagnosis not present

## 2019-04-15 DIAGNOSIS — M545 Low back pain, unspecified: Secondary | ICD-10-CM

## 2019-04-15 DIAGNOSIS — M47816 Spondylosis without myelopathy or radiculopathy, lumbar region: Secondary | ICD-10-CM | POA: Diagnosis not present

## 2019-04-15 DIAGNOSIS — Z6834 Body mass index (BMI) 34.0-34.9, adult: Secondary | ICD-10-CM | POA: Diagnosis not present

## 2019-04-15 MED ORDER — OXYBUTYNIN CHLORIDE ER 5 MG PO TB24: 5.0000 mg | ORAL_TABLET | Freq: Every day | ORAL | 3 refills | Status: DC

## 2019-04-15 MED ORDER — ALPRAZOLAM 0.5 MG PO TABS: ORAL_TABLET | ORAL | 1 refills | Status: DC

## 2019-04-15 NOTE — Assessment & Plan Note (Signed)
CT angio March 2019 - no CAD, PE Wt loss

## 2019-04-15 NOTE — Assessment & Plan Note (Signed)
On B12 

## 2019-04-15 NOTE — Assessment & Plan Note (Signed)
  We discussed age appropriate health related issues, including available/recomended screening tests and vaccinations. We discussed a need for adhering to healthy diet and exercise. Labs were ordered to be later reviewed . All questions were answered. Colon 2018 Dr Fuller Plan  Flu shot

## 2019-04-15 NOTE — Assessment & Plan Note (Signed)
F/u w/Dr Maryjean Ka

## 2019-04-15 NOTE — Addendum Note (Signed)
Addended by: Karren Cobble on: 04/15/2019 10:49 AM   Modules accepted: Orders

## 2019-04-15 NOTE — Patient Instructions (Signed)

## 2019-04-15 NOTE — Progress Notes (Signed)
Subjective:  Patient ID: Courtney Nielsen, female    DOB: Sep 06, 1955  Age: 63 y.o. MRN: [id number]  CC: No chief complaint on file.   HPI Courtney Nielsen presents for a well exam  Outpatient Medications Prior to Visit  Medication Sig Dispense Refill   acetaminophen (TYLENOL) 500 MG tablet Take 1,000 mg by mouth every 6 (six) hours as needed for moderate pain.     ALPRAZolam (XANAX) 0.5 MG tablet TAKE ONE TABLET BY MOUTH AT BEDTIME AS NEEDED FOR ANXIETY. 30 tablet 1   aspirin EC 81 MG tablet Take 81 mg by mouth daily.     bumetanide (BUMEX) 1 MG tablet Take 1 tablet (1 mg total) by mouth daily as needed (Swelling). 30 tablet 1   Cholecalciferol (VITAMIN D3 PO) Take 50 mcg by mouth daily.     Cyanocobalamin (VITAMIN B 12 PO) Take 1,000 mcg by mouth daily.     irbesartan (AVAPRO) 300 MG tablet TAKE 1 TABLET BY MOUTH DAILY GENERIC EQUIVALENT FOR AVAPRO 90 tablet 3   loratadine (CLARITIN) 10 MG tablet Take 10 mg by mouth at bedtime.     Menthol, Topical Analgesic, (BLUE-EMU MAXIMUM STRENGTH EX) Apply 1 application topically daily.      Omega-3 Fatty Acids (FISH OIL) 500 MG CAPS Take 1 capsule by mouth daily.     oxybutynin (DITROPAN XL) 5 MG 24 hr tablet Take 1 tablet (5 mg total) by mouth at bedtime. 30 tablet 1   traMADol-acetaminophen (ULTRACET) 37.5-325 MG tablet 1/2 or 1 tab po qid prn pain 100 tablet 2   No facility-administered medications prior to visit.     ROS: Review of Systems  Constitutional: Negative for activity change, appetite change, chills, fatigue and unexpected weight change.  HENT: Negative for congestion, mouth sores and sinus pressure.   Eyes: Negative for visual disturbance.  Respiratory: Negative for cough and chest tightness.   Gastrointestinal: Negative for abdominal pain and nausea.  Genitourinary: Negative for difficulty urinating, frequency and vaginal pain.  Musculoskeletal: Negative for back pain and gait problem.  Skin: Negative for  pallor and rash.  Neurological: Negative for dizziness, tremors, weakness, numbness and headaches.  Psychiatric/Behavioral: Negative for confusion and sleep disturbance.    Objective:  BP (!) 144/88 (BP Location: Left Arm, Patient Position: Sitting, Cuff Size: Large)    Pulse 83    Temp 97.6 F (36.4 C) (Oral)    Ht 5' 10.5" (1.791 m)    Wt 237 lb (107.5 kg)    SpO2 95%    BMI 33.53 kg/m   BP Readings from Last 3 Encounters:  04/15/19 (!) 144/88  12/26/18 138/88  12/09/18 126/82    Wt Readings from Last 3 Encounters:  04/15/19 237 lb (107.5 kg)  12/26/18 235 lb (106.6 kg)  12/09/18 240 lb (108.9 kg)    Physical Exam Constitutional:      General: She is not in acute distress.    Appearance: She is well-developed. She is obese.  HENT:     Head: Normocephalic.     Right Ear: External ear normal.     Left Ear: External ear normal.     Nose: Nose normal.  Eyes:     General:        Right eye: No discharge.        Left eye: No discharge.     Conjunctiva/sclera: Conjunctivae normal.     Pupils: Pupils are equal, round, and reactive to light.  Neck:  Musculoskeletal: Normal range of motion and neck supple.     Thyroid: No thyromegaly.     Vascular: No JVD.     Trachea: No tracheal deviation.  Cardiovascular:     Rate and Rhythm: Normal rate and regular rhythm.     Heart sounds: Normal heart sounds.  Pulmonary:     Effort: No respiratory distress.     Breath sounds: No stridor. No wheezing.  Abdominal:     General: Bowel sounds are normal. There is no distension.     Palpations: Abdomen is soft. There is no mass.     Tenderness: There is no abdominal tenderness. There is no guarding or rebound.  Musculoskeletal:        General: Tenderness present.  Lymphadenopathy:     Cervical: No cervical adenopathy.  Skin:    Findings: No erythema or rash.  Neurological:     Cranial Nerves: No cranial nerve deficit.     Motor: No abnormal muscle tone.     Coordination:  Coordination normal.     Gait: Gait abnormal.     Deep Tendon Reflexes: Reflexes normal.  Psychiatric:        Behavior: Behavior normal.        Thought Content: Thought content normal.        Judgment: Judgment normal.    LS spine w/pain Lab Results  Component Value Date   WBC 8.4 04/08/2019   HGB 13.7 04/08/2019   HCT 40.6 04/08/2019   PLT 293.0 04/08/2019   GLUCOSE 89 04/08/2019   CHOL 193 04/08/2019   TRIG 98.0 04/08/2019   HDL 45.90 04/08/2019   LDLDIRECT 164.6 01/05/2008   LDLCALC 128 (H) 04/08/2019   ALT 19 04/08/2019   AST 13 04/08/2019   NA 144 04/08/2019   K 3.7 04/08/2019   CL 107 04/08/2019   CREATININE 1.04 04/08/2019   BUN 11 04/08/2019   CO2 27 04/08/2019   TSH 1.02 04/08/2019   INR 0.96 11/22/2016   HGBA1C 5.6 03/02/2014    Ct Angio Chest W/cm &/or Wo Cm  Result Date: 08/30/2017 CLINICAL DATA:  63 year old female with acute shortness of breath cough and tachycardia. Elevated D-dimer. EXAM: CT ANGIOGRAPHY CHEST WITH CONTRAST TECHNIQUE: Multidetector CT imaging of the chest was performed using the standard protocol during bolus administration of intravenous contrast. Multiplanar CT image reconstructions and MIPs were obtained to evaluate the vascular anatomy. CONTRAST:  170mL ISOVUE-370 IOPAMIDOL (ISOVUE-370) INJECTION 76% COMPARISON:  08/29/2017 chest radiograph, 12/30/2008 chest CT and prior studies. FINDINGS: Cardiovascular: This is a technically adequate study is but respiratory motion artifact in some portions of the lungs slightly decreases sensitivity. No pulmonary emboli are identified. Heart size normal. A new small pericardial effusion is noted. Aortic atherosclerotic calcifications noted without aneurysm. Mediastinum/Nodes: UPPER limits of normal sized mediastinal and hilar lymph nodes are unchanged from 2010. No mediastinal mass. Visualized thyroid and esophagus are unremarkable. Lungs/Pleura: Mild dependent/basilar atelectasis/interstitial prominence  again noted. There is no evidence of airspace disease, consolidation, mass, nodule, pleural effusion or pneumothorax. Upper Abdomen: No acute abnormality. Hypodense LEFT hepatic lesions again noted. Musculoskeletal: No chest wall abnormality. No acute or significant osseous findings. Review of the MIP images confirms the above findings. IMPRESSION: 1. Small pericardial effusion of uncertain chronicity but new since 2010. 2. No evidence of pulmonary emboli or thoracic aortic aneurysm. 3. Mild dependent and basilar atelectasis/interstitial prominence again noted. 4.  Aortic Atherosclerosis (ICD10-I70.0). Electronically Signed   By: Margarette Canada M.D.   On: 08/30/2017  12:12    Assessment & Plan:   There are no diagnoses linked to this encounter.   No orders of the defined types were placed in this encounter.    Follow-up: No follow-ups on file.  Walker Kehr, MD

## 2019-04-20 DIAGNOSIS — H25812 Combined forms of age-related cataract, left eye: Secondary | ICD-10-CM | POA: Diagnosis not present

## 2019-04-20 DIAGNOSIS — H26491 Other secondary cataract, right eye: Secondary | ICD-10-CM | POA: Diagnosis not present

## 2019-04-20 DIAGNOSIS — H348321 Tributary (branch) retinal vein occlusion, left eye, with retinal neovascularization: Secondary | ICD-10-CM | POA: Diagnosis not present

## 2019-04-20 DIAGNOSIS — H4312 Vitreous hemorrhage, left eye: Secondary | ICD-10-CM | POA: Diagnosis not present

## 2019-05-04 DIAGNOSIS — H25812 Combined forms of age-related cataract, left eye: Secondary | ICD-10-CM | POA: Diagnosis not present

## 2019-05-05 NOTE — H&P (Signed)
Surgical History & Physical  Patient Name: Courtney Nielsen DOB: Mar 21, 1956  Surgery: Cataract extraction with intraocular lens implant phacoemulsification; Left Eye  Surgeon: Baruch Goldmann MD Surgery Date:  05/11/2019 Pre-Op Date:  04/20/2019   HPI: A 57 Yr. old female patient 1. The patient complains of difficulty when viewing TV, reading closed caption, news scrolls on TV, which began 6 months ago. The left eye is affected. The episode is sudden. The condition's severity is moderate. Symptoms occur when the patient is driving and outside. c/o glare while diving at night. Pt states she went to Retina specialist, laser done in the left eye 3 months ago.  Medical History: Cataracts BRVO OS 2008 Anxiety Arthritis High Blood Pressure   Review of Systems Negative Allergic/Immunologic Negative Cardiovascular Negative Constitutional Negative Ear, Nose, Mouth & Throat Negative Endocrine Negative Eyes Negative Gastrointestinal Negative Genitourinary Negative Hemotologic/Lymphatic Negative Integumentary Negative Musculoskeletal Negative Neurological Negative Psychiatry Negative Respiratory  Social   Former smoker  Medication Xanax, Aspirin, Vitamin B-12, Vitamin D3, Loratadine, Tramadol hydrochloride, Irbesartan,   Sx/Procedures Phaco c IOL,  Meniscus repair, Knee Replacement Right,   Drug Allergies  Hydromorphon, Sulfa (sulfonamide antibiotics), Meloxicam, Verapamil, Cefuroxime,   History & Physical: Heent:  Cataract, Left eye NECK: supple without bruits LUNGS: lungs clear to auscultation CV: regular rate and rhythm Abdomen: soft and non-tender   Impression & Plan: Assessment: 1.  COMBINED FORMS AGE RELATED CATARACT; Left Eye (H25.812) 2.  VITREOUS HEMORRHAGE; Left Eye (H43.12) 3.  PCO; Right Eye (H26.491) 4.  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  INTRAOCULAR LENS IOL ; Right Eye (Z96.1)  Plan: 1.  Cataract  accounts for the patient's decreased vision.  Cataract accounts for the patient's decreased vision. This visual impairment is not correctable with a tolerable change in glasses or contact lenses. Cataract surgery with an implantation of a new lens should significantly improve the visual and functional status of the patient. Discussed all risks, benefits, alternatives, and potential complications. Discussed the procedures and recovery. Patient desires to have surgery. A-scan ordered and performed today for intra-ocular lens calculations. The surgery will be performed in order to improve vision for driving, reading, and for eye examinations. Recommend phacoemulsification with intra-ocular lens. Left Eye. Surgery required to correct imbalance of vision. Dilates well - shugarcaine by protocol. 2.  S/P PRP OS - healing well. 3.  Mild - monitor. 4.  S/P PRP OS. Stable. No new hemorrhages. 5.  Mild PCO OD as above.

## 2019-05-07 ENCOUNTER — Other Ambulatory Visit (HOSPITAL_COMMUNITY)
Admission: RE | Admit: 2019-05-07 | Discharge: 2019-05-07 | Disposition: A | Discharge status: Home / Self Care | Payer: BC Managed Care – PPO | Source: Ambulatory Visit | Attending: Ophthalmology | Admitting: Ophthalmology

## 2019-05-07 ENCOUNTER — Other Ambulatory Visit: Payer: Self-pay

## 2019-05-07 ENCOUNTER — Encounter (HOSPITAL_COMMUNITY): Payer: Self-pay

## 2019-05-07 ENCOUNTER — Encounter (HOSPITAL_COMMUNITY)
Admission: RE | Admit: 2019-05-07 | Discharge: 2019-05-07 | Disposition: A | Discharge status: Home / Self Care | Payer: BC Managed Care – PPO | Source: Ambulatory Visit | Attending: Ophthalmology | Admitting: Ophthalmology

## 2019-05-07 DIAGNOSIS — Z20828 Contact with and (suspected) exposure to other viral communicable diseases: Secondary | ICD-10-CM | POA: Insufficient documentation

## 2019-05-07 DIAGNOSIS — Z01812 Encounter for preprocedural laboratory examination: Secondary | ICD-10-CM | POA: Insufficient documentation

## 2019-05-07 LAB — SARS CORONAVIRUS 2 (TAT 6-24 HRS): SARS Coronavirus 2: NEGATIVE

## 2019-05-11 ENCOUNTER — Ambulatory Visit (HOSPITAL_COMMUNITY)
Admission: RE | Admit: 2019-05-11 | Discharge: 2019-05-11 | Disposition: A | Discharge status: Home / Self Care | Payer: BC Managed Care – PPO | Attending: Ophthalmology | Admitting: Ophthalmology

## 2019-05-11 ENCOUNTER — Ambulatory Visit (HOSPITAL_COMMUNITY): Payer: BC Managed Care – PPO | Admitting: Certified Registered Nurse Anesthetist

## 2019-05-11 ENCOUNTER — Encounter (HOSPITAL_COMMUNITY)
Admission: RE | Disposition: A | Discharge status: Home / Self Care | Payer: Self-pay | Source: Home / Self Care | Attending: Ophthalmology

## 2019-05-11 ENCOUNTER — Encounter (HOSPITAL_COMMUNITY): Payer: Self-pay | Admitting: *Deleted

## 2019-05-11 DIAGNOSIS — H348321 Tributary (branch) retinal vein occlusion, left eye, with retinal neovascularization: Secondary | ICD-10-CM | POA: Insufficient documentation

## 2019-05-11 DIAGNOSIS — Z79899 Other long term (current) drug therapy: Secondary | ICD-10-CM | POA: Insufficient documentation

## 2019-05-11 DIAGNOSIS — Z87891 Personal history of nicotine dependence: Secondary | ICD-10-CM | POA: Diagnosis not present

## 2019-05-11 DIAGNOSIS — Z961 Presence of intraocular lens: Secondary | ICD-10-CM | POA: Diagnosis not present

## 2019-05-11 DIAGNOSIS — Z9841 Cataract extraction status, right eye: Secondary | ICD-10-CM | POA: Insufficient documentation

## 2019-05-11 DIAGNOSIS — I10 Essential (primary) hypertension: Secondary | ICD-10-CM | POA: Diagnosis not present

## 2019-05-11 DIAGNOSIS — Z885 Allergy status to narcotic agent status: Secondary | ICD-10-CM | POA: Diagnosis not present

## 2019-05-11 DIAGNOSIS — H25812 Combined forms of age-related cataract, left eye: Secondary | ICD-10-CM | POA: Diagnosis not present

## 2019-05-11 DIAGNOSIS — R0609 Other forms of dyspnea: Secondary | ICD-10-CM | POA: Diagnosis not present

## 2019-05-11 DIAGNOSIS — H4312 Vitreous hemorrhage, left eye: Secondary | ICD-10-CM | POA: Diagnosis not present

## 2019-05-11 DIAGNOSIS — Z7982 Long term (current) use of aspirin: Secondary | ICD-10-CM | POA: Insufficient documentation

## 2019-05-11 DIAGNOSIS — Z882 Allergy status to sulfonamides status: Secondary | ICD-10-CM | POA: Diagnosis not present

## 2019-05-11 DIAGNOSIS — Z888 Allergy status to other drugs, medicaments and biological substances status: Secondary | ICD-10-CM | POA: Diagnosis not present

## 2019-05-11 DIAGNOSIS — J449 Chronic obstructive pulmonary disease, unspecified: Secondary | ICD-10-CM | POA: Diagnosis not present

## 2019-05-11 DIAGNOSIS — K219 Gastro-esophageal reflux disease without esophagitis: Secondary | ICD-10-CM | POA: Diagnosis not present

## 2019-05-11 DIAGNOSIS — Z96651 Presence of right artificial knee joint: Secondary | ICD-10-CM | POA: Insufficient documentation

## 2019-05-11 HISTORY — PX: CATARACT EXTRACTION W/PHACO: SHX586

## 2019-05-11 SURGERY — PHACOEMULSIFICATION, CATARACT, WITH IOL INSERTION
Anesthesia: Monitor Anesthesia Care | Site: Eye | Laterality: Left

## 2019-05-11 MED ORDER — CYCLOPENTOLATE-PHENYLEPHRINE 0.2-1 % OP SOLN
1.0000 [drp] | OPHTHALMIC | Status: AC | PRN
  Administered 2019-05-11 (×3): 1 [drp] via OPHTHALMIC

## 2019-05-11 MED ORDER — POVIDONE-IODINE 5 % OP SOLN
OPHTHALMIC | Status: DC | PRN
  Administered 2019-05-11: 1 via OPHTHALMIC

## 2019-05-11 MED ORDER — EPINEPHRINE PF 1 MG/ML IJ SOLN
INTRAMUSCULAR | Status: AC
  Filled 2019-05-11: qty 1

## 2019-05-11 MED ORDER — LIDOCAINE HCL (PF) 1 % IJ SOLN
INTRAOCULAR | Status: DC | PRN
  Administered 2019-05-11: 1 mL via OPHTHALMIC

## 2019-05-11 MED ORDER — EPINEPHRINE PF 1 MG/ML IJ SOLN
INTRAOCULAR | Status: DC | PRN
  Administered 2019-05-11: 500 mL

## 2019-05-11 MED ORDER — PHENYLEPHRINE HCL 2.5 % OP SOLN
1.0000 [drp] | OPHTHALMIC | Status: AC | PRN
  Administered 2019-05-11 (×3): 1 [drp] via OPHTHALMIC

## 2019-05-11 MED ORDER — TETRACAINE HCL 0.5 % OP SOLN
1.0000 [drp] | OPHTHALMIC | Status: AC | PRN
  Administered 2019-05-11 (×3): 1 [drp] via OPHTHALMIC

## 2019-05-11 MED ORDER — SODIUM HYALURONATE 23 MG/ML IO SOLN
INTRAOCULAR | Status: DC | PRN
  Administered 2019-05-11: 0.6 mL via INTRAOCULAR

## 2019-05-11 MED ORDER — PROVISC 10 MG/ML IO SOLN
INTRAOCULAR | Status: DC | PRN
  Administered 2019-05-11: 0.85 mL via INTRAOCULAR

## 2019-05-11 MED ORDER — BSS IO SOLN
INTRAOCULAR | Status: DC | PRN
  Administered 2019-05-11: 15 mL

## 2019-05-11 MED ORDER — LIDOCAINE HCL 3.5 % OP GEL
1.0000 "application " | Freq: Once | OPHTHALMIC | Status: AC
  Administered 2019-05-11: 1 via OPHTHALMIC

## 2019-05-11 SURGICAL SUPPLY — 13 items

## 2019-05-11 NOTE — Discharge Instructions (Signed)
Please discharge patient when stable, will follow up today with Dr. Menna Abeln at the [facility] office immediately following discharge.  Leave shield in place until visit.  All paperwork with discharge instructions will be given at the office. ° °

## 2019-05-11 NOTE — Anesthesia Preprocedure Evaluation (Signed)
Anesthesia Evaluation  Patient identified by MRN, date of birth, ID band Patient awake    Reviewed: Allergy & Precautions, NPO status , Patient's Chart, lab work & pertinent test results  Airway Mallampati: II  TM Distance: >3 FB Neck ROM: Full    Dental no notable dental hx. (+) Teeth Intact   Pulmonary pneumonia, resolved, COPD, former smoker,    Pulmonary exam normal breath sounds clear to auscultation       Cardiovascular Exercise Tolerance: Poor hypertension, Pt. on medications + DOE  Normal cardiovascular exam Rhythm:Regular Rate:Normal  Reports ET limited by LBP Denies CP/MI/   Neuro/Psych negative neurological ROS  negative psych ROS   GI/Hepatic Neg liver ROS, GERD  Medicated and Controlled,  Endo/Other  negative endocrine ROS  Renal/GU negative Renal ROS  negative genitourinary   Musculoskeletal negative musculoskeletal ROS (+)   Abdominal   Peds negative pediatric ROS (+)  Hematology negative hematology ROS (+)   Anesthesia Other Findings   Reproductive/Obstetrics negative OB ROS                             Anesthesia Physical Anesthesia Plan  ASA: II  Anesthesia Plan: MAC   Post-op Pain Management:    Induction: Intravenous  PONV Risk Score and Plan: 2 and Ondansetron, Treatment may vary due to age or medical condition and TIVA  Airway Management Planned: Nasal Cannula and Simple Face Mask  Additional Equipment:   Intra-op Plan:   Post-operative Plan:   Informed Consent: I have reviewed the patients History and Physical, chart, labs and discussed the procedure including the risks, benefits and alternatives for the proposed anesthesia with the patient or authorized representative who has indicated his/her understanding and acceptance.     Dental advisory given  Plan Discussed with: CRNA  Anesthesia Plan Comments: (Plan Full PPE use  Plan MAC -WTP with  same after Q&A  Second eye , last in 2017 under MAC without trouble , c/o LBP)        Anesthesia Quick Evaluation

## 2019-05-11 NOTE — Interval H&P Note (Signed)
History and Physical Interval Note: The H and P was reviewed and updated. The patient was examined.  No changes were found after exam.  The surgical eye was marked.  05/11/2019 9:49 AM  Courtney Nielsen  has presented today for surgery, with the diagnosis of Nuclear sclerotic cataract - Left eye.  The various methods of treatment have been discussed with the patient and family. After consideration of risks, benefits and other options for treatment, the patient has consented to  Procedure(s) with comments: CATARACT EXTRACTION PHACO AND INTRAOCULAR LENS PLACEMENT ([place]) (Left) - left as a surgical intervention.  The patient's history has been reviewed, patient examined, no change in status, stable for surgery.  I have reviewed the patient's chart and labs.  Questions were answered to the patient's satisfaction.     Courtney Nielsen

## 2019-05-11 NOTE — Transfer of Care (Signed)
Immediate Anesthesia Transfer of Care Note  Patient: Courtney Nielsen  Procedure(s) Performed: CATARACT EXTRACTION PHACO AND INTRAOCULAR LENS PLACEMENT LEFT EYE CDE=5.67 (Left Eye)  Patient Location: PACU  Anesthesia Type:MAC  Level of Consciousness: awake, alert  and oriented  Airway & Oxygen Therapy: Patient Spontanous Breathing  Post-op Assessment: Report given to RN and Post -op Vital signs reviewed and stable  Post vital signs: Reviewed and stable  Last Vitals:  Vitals Value Taken Time  BP    Temp    Pulse    Resp    SpO2      Last Pain:  Vitals:   05/11/19 0933  TempSrc: Oral         Complications: No apparent anesthesia complications

## 2019-05-11 NOTE — Anesthesia Postprocedure Evaluation (Signed)
Anesthesia Post Note  Patient: Courtney Nielsen  Procedure(s) Performed: CATARACT EXTRACTION PHACO AND INTRAOCULAR LENS PLACEMENT LEFT EYE CDE=5.67 (Left Eye)  Patient location during evaluation: Phase II Anesthesia Type: MAC Level of consciousness: awake and alert Pain management: pain level controlled Vital Signs Assessment: post-procedure vital signs reviewed and stable Respiratory status: spontaneous breathing Cardiovascular status: stable Postop Assessment: able to ambulate and adequate PO intake Anesthetic complications: no     Last Vitals: There were no vitals filed for this visit.  Last Pain:  Vitals:   05/11/19 0933  TempSrc: Oral                 Cru Kritikos Hristova

## 2019-05-11 NOTE — Op Note (Addendum)
Date of procedure: 05/11/19  Pre-operative diagnosis: Visually significant age-related combined cataract, Left Eye (H25.812)  Post-operative diagnosis: Visually significant age-related combined cataract, Left Eye (H25.812)  Procedure: Removal of cataract via phacoemulsification and insertion of intra-ocular lens [employer] PCB00  +21.0D into the capsular bag of the Left Eye  Attending surgeon: Gerda Diss. Amadou Katzenstein, MD, MA  Anesthesia: MAC, Topical [place]  Complications: None  Estimated Blood Loss: <66m (minimal)  Specimens: None  Implants: As above  Indications:  Visually significant age-related cataract, Left Eye  Procedure:  The patient was seen and identified in the pre-operative area. The operative eye was identified and dilated.  The operative eye was marked.  Topical anesthesia was administered to the operative eye.     The patient was then to the operative suite and placed in the supine position.  A timeout was performed confirming the patient, procedure to be performed, and all other relevant information.   The patient's face was prepped and draped in the usual fashion for intra-ocular surgery.  A lid speculum was placed into the operative eye and the surgical microscope moved into place and focused.  An inferotemporal paracentesis was created using a 20 gauge paracentesis blade.  Shugarcaine was injected into the anterior chamber.  Viscoelastic was injected into the anterior chamber.  A temporal clear-corneal main wound incision was created using a 2.474mmicrokeratome.  A continuous curvilinear capsulorrhexis was initiated using an irrigating cystitome and completed using capsulorrhexis forceps.  Hydrodissection and hydrodeliniation were performed.  Viscoelastic was injected into the anterior chamber.  A phacoemulsification handpiece and a chopper as a second instrument were used to remove the nucleus and epinucleus. The irrigation/aspiration handpiece was used to remove  any remaining cortical material.   The capsular bag was reinflated with viscoelastic, checked, and found to be intact.  The intraocular lens was inserted into the capsular bag.  The irrigation/aspiration handpiece was used to remove any remaining viscoelastic.  The clear corneal wound and paracentesis wounds were then hydrated and checked with Weck-Cels to be watertight.  The lid-speculum and drape was removed, and the patient's face was cleaned with a wet and dry 4x4.  A clear shield was taped over the eye. The patient was taken to the post-operative care unit in good condition, having tolerated the procedure well.  Post-Op Instructions: [facility] and will receive all other orders and instructions.

## 2019-05-12 ENCOUNTER — Encounter (HOSPITAL_COMMUNITY): Payer: Self-pay | Admitting: Ophthalmology

## 2019-06-12 DIAGNOSIS — M1612 Unilateral primary osteoarthritis, left hip: Secondary | ICD-10-CM | POA: Diagnosis not present

## 2019-06-17 ENCOUNTER — Telehealth: Payer: Self-pay | Admitting: *Deleted

## 2019-06-17 NOTE — Telephone Encounter (Signed)
   Primary Cardiologist: Buford Dresser, MD  Chart reviewed as part of pre-operative protocol coverage. Patient was contacted 06/17/2019 in reference to pre-operative risk assessment for pending surgery as outlined below.  Courtney Nielsen was last seen on 12/26/2018 by Dr. Elson Clan.  Since that day, Courtney Nielsen has done very well from a cardiac standpoint. She has no CV complaints and states that her LE swelling is very well controlled with diuretics. She can easily perform greater than 4 METS without symptoms.   Therefore, based on ACC/AHA guidelines, the patient would be at acceptable risk for the planned procedure without further cardiovascular testing.   I will route this recommendation to the requesting party via Epic fax function and remove from pre-op pool.  Please call with questions.  Kathyrn Drown, NP 06/17/2019, 3:16 PM

## 2019-06-17 NOTE — Telephone Encounter (Signed)
   [facility] Pre-operative Risk Assessment    Request for surgical clearance:  1. What type of surgery is being performed? LEFT TOTAL HIP ARTHROPLASTY   2. When is this surgery scheduled? 08/11/19  3. What type of clearance is required (medical clearance vs. Pharmacy clearance to hold med vs. Both)? MEDICAL  4. Are there any medications that need to be held prior to surgery and how long? N/A   5. Practice name and name of physician performing surgery? [facility]   6. What is your office phone number [phone]   7.   What is your office fax number  [phone]  8.   Anesthesia type (None, local, MAC, general) ? CHOICE   Shade Rivenbark 06/17/2019, 1:55 PM  _________________________________________________________________   (provider comments below)

## 2019-07-08 DIAGNOSIS — M5126 Other intervertebral disc displacement, lumbar region: Secondary | ICD-10-CM | POA: Diagnosis not present

## 2019-07-08 DIAGNOSIS — M4316 Spondylolisthesis, lumbar region: Secondary | ICD-10-CM | POA: Diagnosis not present

## 2019-07-08 DIAGNOSIS — M47816 Spondylosis without myelopathy or radiculopathy, lumbar region: Secondary | ICD-10-CM | POA: Diagnosis not present

## 2019-07-09 DIAGNOSIS — Z6834 Body mass index (BMI) 34.0-34.9, adult: Secondary | ICD-10-CM | POA: Diagnosis not present

## 2019-07-09 DIAGNOSIS — Z1231 Encounter for screening mammogram for malignant neoplasm of breast: Secondary | ICD-10-CM | POA: Diagnosis not present

## 2019-07-09 DIAGNOSIS — Z01419 Encounter for gynecological examination (general) (routine) without abnormal findings: Secondary | ICD-10-CM | POA: Diagnosis not present

## 2019-07-27 ENCOUNTER — Telehealth: Payer: Self-pay

## 2019-07-27 DIAGNOSIS — M25561 Pain in right knee: Secondary | ICD-10-CM | POA: Insufficient documentation

## 2019-07-27 DIAGNOSIS — M25562 Pain in left knee: Secondary | ICD-10-CM | POA: Insufficient documentation

## 2019-07-27 DIAGNOSIS — Z01818 Encounter for other preprocedural examination: Secondary | ICD-10-CM

## 2019-07-27 NOTE — Telephone Encounter (Signed)
Have not received form they are refaxing it

## 2019-07-27 NOTE — Telephone Encounter (Signed)
Have you seen medical clearance form for this pt.Marland KitchenJohny Nielsen

## 2019-07-27 NOTE — Telephone Encounter (Signed)
I think I took care of it if I am not mistaken.  I do not think I have it now.  Thanks

## 2019-07-27 NOTE — Telephone Encounter (Signed)
New message   [facility] sent form over last week.    Request for surgical clearance:  1. What type of surgery is being performed?  Left hip replacement   2. When is this surgery scheduled?  08/11/19   3. What type of clearance is required (medical clearance vs. Pharmacy clearance to hold med vs. Both)? Medical clearance   4. Are there any medications that need to be held prior to surgery and how long? aspirin EC 81 MG tablet & vitamins    5. Practice name and name of physician performing surgery? [facility]   6. What is your office phone number [phone]

## 2019-07-29 NOTE — Telephone Encounter (Signed)
Forms was scanned into chart. Printed and place on MD desk. Pt is schedule to have surgery on 08/11/19.Marland KitchenJohny Chess

## 2019-07-29 NOTE — Telephone Encounter (Signed)
MD completed form notified pt has been faxed back to [facility] Pt states they are also wanting her to have labs done prior and was told to come here to have done. She is needing a Complete Metabolic Panel, CBC w/Diff, and Prothrombin. Dx [id number]. Is this ok to enter.Marland KitchenJohny Chess

## 2019-07-29 NOTE — Telephone Encounter (Signed)
Ok Thx 

## 2019-07-30 NOTE — Telephone Encounter (Signed)
Notified pt labs have been entered./lmb

## 2019-08-03 ENCOUNTER — Other Ambulatory Visit: Payer: BLUE CROSS/BLUE SHIELD

## 2019-08-03 ENCOUNTER — Other Ambulatory Visit: Payer: Self-pay

## 2019-08-03 DIAGNOSIS — Z01818 Encounter for other preprocedural examination: Secondary | ICD-10-CM

## 2019-08-03 LAB — CBC WITH DIFFERENTIAL/PLATELET
Basophils Absolute: 0.1 10*3/uL (ref 0.0–0.1)
Basophils Relative: 1 % (ref 0.0–3.0)
Eosinophils Absolute: 0.6 10*3/uL (ref 0.0–0.7)
Eosinophils Relative: 7.5 % — ABNORMAL HIGH (ref 0.0–5.0)
HCT: 42.6 % (ref 36.0–46.0)
Hemoglobin: 14.1 g/dL (ref 12.0–15.0)
Lymphocytes Relative: 45.5 % (ref 12.0–46.0)
Lymphs Abs: 3.4 10*3/uL (ref 0.7–4.0)
MCHC: 33.1 g/dL (ref 30.0–36.0)
MCV: 92.7 fl (ref 78.0–100.0)
Monocytes Absolute: 0.4 10*3/uL (ref 0.1–1.0)
Monocytes Relative: 5.3 % (ref 3.0–12.0)
Neutro Abs: 3.1 10*3/uL (ref 1.4–7.7)
Neutrophils Relative %: 40.7 % — ABNORMAL LOW (ref 43.0–77.0)
Platelets: 244 10*3/uL (ref 150.0–400.0)
RBC: 4.6 Mil/uL (ref 3.87–5.11)
RDW: 13.3 % (ref 11.5–15.5)
WBC: 7.5 10*3/uL (ref 4.0–10.5)

## 2019-08-03 LAB — BASIC METABOLIC PANEL
BUN: 14 mg/dL (ref 6–23)
CO2: 27 mEq/L (ref 19–32)
Calcium: 9.5 mg/dL (ref 8.4–10.5)
Chloride: 104 mEq/L (ref 96–112)
Creatinine, Ser: 1.16 mg/dL (ref 0.40–1.20)
GFR: 47.13 mL/min — ABNORMAL LOW (ref >60.00)
Glucose, Bld: 85 mg/dL (ref 70–99)
Potassium: 3.8 mEq/L (ref 3.5–5.1)
Sodium: 141 mEq/L (ref 135–145)

## 2019-08-03 LAB — PROTIME-INR
INR: 0.9 ratio (ref 0.8–1.0)
Prothrombin Time: 10.5 s (ref 9.6–13.1)

## 2019-08-11 DIAGNOSIS — Z96651 Presence of right artificial knee joint: Secondary | ICD-10-CM | POA: Diagnosis not present

## 2019-08-11 DIAGNOSIS — M1612 Unilateral primary osteoarthritis, left hip: Secondary | ICD-10-CM | POA: Diagnosis not present

## 2019-09-10 ENCOUNTER — Telehealth: Payer: Self-pay | Admitting: Family

## 2019-09-10 ENCOUNTER — Other Ambulatory Visit: Payer: BC Managed Care – PPO

## 2019-09-10 ENCOUNTER — Encounter: Payer: Self-pay | Admitting: Family

## 2019-09-10 ENCOUNTER — Other Ambulatory Visit: Payer: Self-pay

## 2019-09-10 ENCOUNTER — Ambulatory Visit: Payer: BC Managed Care – PPO | Admitting: Family

## 2019-09-10 DIAGNOSIS — R1032 Left lower quadrant pain: Secondary | ICD-10-CM

## 2019-09-10 DIAGNOSIS — K5732 Diverticulitis of large intestine without perforation or abscess without bleeding: Secondary | ICD-10-CM | POA: Diagnosis not present

## 2019-09-10 DIAGNOSIS — K8020 Calculus of gallbladder without cholecystitis without obstruction: Secondary | ICD-10-CM | POA: Diagnosis not present

## 2019-09-10 MED ORDER — METRONIDAZOLE 500 MG PO TABS: 500.0000 mg | ORAL_TABLET | Freq: Three times a day (TID) | ORAL | 0 refills | Status: DC

## 2019-09-10 NOTE — Telephone Encounter (Signed)
Please let her know that the CT did show diverticulitis but no abscess or perforation. As we discussed, her drug allergies make complete treatment harder but please take the Flagyl and let us know if she is not feeling better.

## 2019-09-10 NOTE — Telephone Encounter (Signed)
2nd note- the CT also showed gallstones but if she not having abdominal pain after eating/ nausea after eating, can watch this for now.

## 2019-09-10 NOTE — Progress Notes (Signed)
Courtney Nielsen is a 64 y.o. female with the following history as recorded in EpicCare:  Patient Active Problem List   Diagnosis Date Noted  . Dyslipidemia 04/15/2019  . DOE (dyspnea on exertion) 12/09/2018  . Tachycardia 08/29/2017  . Periscapular pain 08/29/2017  . Primary osteoarthritis of right knee 12/04/2016  . OA (osteoarthritis) of knee 12/03/2016  . LLQ abdominal pain 03/15/2016  . Lateral meniscal tear 10/19/2015  . Left hip pain 07/22/2013  . Occipital neuralgia 06/04/2013  . Leg pain 02/22/2012  . Gout 02/22/2012  . Weight gain 01/11/2012  . Bilateral leg edema 06/07/2011  . Pruritus 04/23/2011  . Well adult exam 04/23/2011  . Pneumonia, organism unspecified(486) 04/21/2011  . Cough 04/19/2011  . Paresthesia 09/26/2010  . NECK PAIN 08/22/2010  . BACK PAIN, THORACIC REGION 08/22/2010  . Low back pain 08/22/2010  . DIZZINESS 08/22/2010  . Cerumen impaction 07/21/2010  . CRAMP OF LIMB 03/16/2010  . BRONCHITIS, ACUTE 07/22/2009  . COPD 07/22/2009  . MEDIASTINAL LYMPHADENOPATHY 08/16/2008  . Nonspecific (abnormal) findings on radiological and other examination of body structure 08/16/2008  . CT, CHEST, ABNORMAL 08/16/2008  . BRADYCARDIA 06/23/2008  . GERD 06/23/2008  . SYNCOPE 06/23/2008  . B12 deficiency 04/09/2008  . Pain in Soft Tissues of Limb 01/08/2008  . RASH AND OTHER NONSPECIFIC SKIN ERUPTION 01/08/2008  . SINUSITIS, ACUTE 10/31/2007  . HIP PAIN 10/31/2007  . Essential hypertension 04/21/2007  . MENOPAUSAL SYNDROME 04/21/2007  . ALLERGIC RHINITIS 04/19/2007  . COLONIC POLYPS, HX OF 04/19/2007    Current Outpatient Medications  Medication Sig Dispense Refill  . acetaminophen (TYLENOL) 500 MG tablet Take 1,000 mg by mouth every 6 (six) hours as needed for moderate pain.    Marland Kitchen ALPRAZolam (XANAX) 0.5 MG tablet 1 po qhs prn insomnia (Patient taking differently: Take 0.5 mg by mouth at bedtime. ) 90 tablet 1  . aspirin EC 81 MG tablet Take 81 mg by mouth  at bedtime.     . bumetanide (BUMEX) 1 MG tablet Take 1 tablet (1 mg total) by mouth daily as needed (Swelling). 30 tablet 1  . Cholecalciferol (VITAMIN D3) 50 MCG (2000 UT) TABS Take 2,000 Units by mouth at bedtime.    . irbesartan (AVAPRO) 300 MG tablet TAKE 1 TABLET BY MOUTH DAILY GENERIC EQUIVALENT FOR AVAPRO (Patient taking differently: Take 300 mg by mouth at bedtime. ) 90 tablet 3  . loratadine (CLARITIN) 10 MG tablet Take 10 mg by mouth at bedtime.    . Menthol, Topical Analgesic, (BLUE-EMU MAXIMUM STRENGTH EX) Apply 1 application topically daily.     . methocarbamol (ROBAXIN) 500 MG tablet Take 500 mg by mouth 3 (three) times daily as needed.    . Omega-3 Fatty Acids (FISH OIL) 500 MG CAPS Take 500 mg by mouth at bedtime.     Marland Kitchen oxybutynin (DITROPAN XL) 5 MG 24 hr tablet Take 1 tablet (5 mg total) by mouth at bedtime. 90 tablet 3  . PRESCRIPTION MEDICATION Place 1 drop into the left eye 3 (three) times daily. Pred-Gati-Brom (Prednisolone Acetate/Gatifloxacin/Bromfenac)    . traMADol (ULTRAM) 50 MG tablet Take 50-100 mg by mouth every 6 (six) hours as needed.    . traMADol-acetaminophen (ULTRACET) 37.5-325 MG tablet 1/2 or 1 tab po qid prn pain (Patient taking differently: Take 0.5 tablets by mouth at bedtime as needed (for back pain.). ) 100 tablet 2  . vitamin B-12 (CYANOCOBALAMIN) 1000 MCG tablet Take 1,000 mcg by mouth at bedtime.    Marland Kitchen  metroNIDAZOLE (FLAGYL) 500 MG tablet Take 1 tablet (500 mg total) by mouth 3 (three) times daily. 21 tablet 0   No current facility-administered medications for this visit.    Allergies: Meloxicam, Dilaudid [hydromorphone hcl], Levaquin [levofloxacin], Ceftin [cefuroxime axetil], Coreg [carvedilol], Furosemide, Prednisone, Sulfonamide derivatives, and Verapamil  Past Medical History:  Diagnosis Date  . Allergic rhinitis   . Allergy   . Arthritis   . Cataract    right eye removed with lens implant   . Chest pain   . Colonic polyp   . COPD (chronic  obstructive pulmonary disease) ([place])    patient denies at preop on ;   . Dysrhythmia    hx of every third beat is " off" per patient   . Hypertension   . Pneumonia    hx of 2014   . Smoker   . Vitamin B12 deficiency     Past Surgical History:  Procedure Laterality Date  . CARDIAC CATHETERIZATION     cath negative per patient   . CATARACT EXTRACTION W/PHACO Right 06/11/2016   Procedure: CATARACT EXTRACTION PHACO AND INTRAOCULAR LENS PLACEMENT RIGHT EYE CDE=6.15;  Surgeon: Tonny Branch, MD;  Location: [facility];  Service: Ophthalmology;  Laterality: Right;  right  . CATARACT EXTRACTION W/PHACO Left 05/11/2019   Procedure: CATARACT EXTRACTION PHACO AND INTRAOCULAR LENS PLACEMENT LEFT EYE CDE=5.67;  Surgeon: Baruch Goldmann, MD;  Location: [facility];  Service: Ophthalmology;  Laterality: Left;  left  . COLONOSCOPY    . KNEE ARTHROSCOPY Right 10/19/2015   Procedure: RIGHT KNEE SCOPE WITH MENISCAL DEBRIDEMENT,LATERAL SYNOVECTOMY;  Surgeon: Gaynelle Arabian, MD;  Location: [facility];  Service: Orthopedics;  Laterality: Right;  . POLYPECTOMY    . TOTAL KNEE ARTHROPLASTY Right 12/03/2016   Procedure: RIGHT TOTAL KNEE ARTHROPLASTY;  Surgeon: Gaynelle Arabian, MD;  Location: [facility];  Service: Orthopedics;  Laterality: Right;    Family History  Problem Relation Age of Onset  . Arthritis Mother   . Heart disease Mother   . COPD Father   . Cancer Father        Throat and stomach  . Arthritis Father   . Lung cancer Father   . Lymphoma Father   . Esophageal cancer Father   . Asthma Brother   . Emphysema Paternal Grandfather   . Hypertension Other   . Diabetes Maternal Aunt   . Colon cancer Neg Hx   . Colon polyps Neg Hx   . Rectal cancer Neg Hx   . Stomach cancer Neg Hx     Social History   Tobacco Use  . Smoking status: Former Smoker    Packs/day: 1.00    Years: 20.00    Pack years: 20.00    Types: Cigarettes    Quit date: 07/03/2011    Years since quitting: 8.1  . Smokeless tobacco: Never Used   Substance Use Topics  . Alcohol use: No    Subjective:  LLQ pain x 5 days; has had one previous episode of diverticulitis; has been running low grade fever/ chills; no blood in stool; very small abnormal appearing bowel movements; notes that she feels full/ bloated;    Objective:  Vitals:   09/10/19 0935  BP: (!) 142/84  Pulse: 96  Temp: 98.1 F (36.7 C)  TempSrc: Oral  SpO2: 96%  Weight: 234 lb 9.6 oz (106.4 kg)  Height: 5' 10.5" (1.791 m)    General: Well developed, well nourished, in no acute distress  Skin : Warm and dry.  Head: Normocephalic and atraumatic  Lungs: Respirations unlabored;  Abdomen: Soft; tender over LLQ;  nondistended; decreased bowel sounds; no masses or hepatosplenomegaly  Musculoskeletal: No deformities; no active joint inflammation  Extremities: No edema, cyanosis, clubbing  Vessels: Symmetric bilaterally  Neurologic: Alert and oriented; speech intact; face symmetrical; moves all extremities well; CNII-XII intact without focal deficit   Assessment:  1. Left lower quadrant abdominal pain     Plan:  Suspect diverticulitis; unfortunately patient is allergic to FQ, Sulfa and she thinks she is allergic to PCN as well; Will update STAT CT today; Rx for Flagyl 500 mg tid x 7 days; follow-up to be determined.  This visit occurred during the SARS-CoV-2 public health emergency.  Safety protocols were in place, including screening questions prior to the visit, additional usage of staff PPE, and extensive cleaning of exam room while observing appropriate contact time as indicated for disinfecting solutions.     No follow-ups on file.  Orders Placed This Encounter  Procedures  . CT Abdomen Pelvis W Contrast    Standing Status:   Future    Standing Expiration Date:   12/10/2020    Order Specific Question:   If indicated for the ordered procedure, I authorize the administration of contrast media per Radiology protocol    Answer:   Yes    Order Specific  Question:   Preferred imaging location?    Answer:   [facility]    Order Specific Question:   Is Oral Contrast requested for this exam?    Answer:   Yes, Per Radiology protocol    Order Specific Question:   Radiology Contrast Protocol - do NOT remove file path    Answer:   [email or web address]    Requested Prescriptions   Signed Prescriptions Disp Refills  . metroNIDAZOLE (FLAGYL) 500 MG tablet 21 tablet 0    Sig: Take 1 tablet (500 mg total) by mouth 3 (three) times daily.

## 2019-09-10 NOTE — Telephone Encounter (Signed)
Spoke with patient and info given 

## 2019-09-11 ENCOUNTER — Other Ambulatory Visit: Payer: Self-pay | Admitting: Internal Medicine

## 2019-09-11 MED ORDER — AMOXICILLIN-POT CLAVULANATE 875-125 MG PO TABS: 1.0000 | ORAL_TABLET | Freq: Two times a day (BID) | ORAL | 0 refills | Status: DC

## 2019-09-15 DIAGNOSIS — Z96642 Presence of left artificial hip joint: Secondary | ICD-10-CM | POA: Diagnosis not present

## 2019-09-15 DIAGNOSIS — Z471 Aftercare following joint replacement surgery: Secondary | ICD-10-CM | POA: Diagnosis not present

## 2019-09-23 DIAGNOSIS — H43813 Vitreous degeneration, bilateral: Secondary | ICD-10-CM | POA: Diagnosis not present

## 2019-09-23 DIAGNOSIS — Z6833 Body mass index (BMI) 33.0-33.9, adult: Secondary | ICD-10-CM | POA: Diagnosis not present

## 2019-09-23 DIAGNOSIS — H43393 Other vitreous opacities, bilateral: Secondary | ICD-10-CM | POA: Diagnosis not present

## 2019-09-23 DIAGNOSIS — H348322 Tributary (branch) retinal vein occlusion, left eye, stable: Secondary | ICD-10-CM | POA: Diagnosis not present

## 2019-09-23 DIAGNOSIS — H43823 Vitreomacular adhesion, bilateral: Secondary | ICD-10-CM | POA: Diagnosis not present

## 2019-09-23 DIAGNOSIS — I10 Essential (primary) hypertension: Secondary | ICD-10-CM | POA: Diagnosis not present

## 2019-09-23 DIAGNOSIS — M47816 Spondylosis without myelopathy or radiculopathy, lumbar region: Secondary | ICD-10-CM | POA: Diagnosis not present

## 2019-09-30 ENCOUNTER — Ambulatory Visit: Payer: BC Managed Care – PPO | Admitting: Internal Medicine

## 2019-09-30 ENCOUNTER — Other Ambulatory Visit: Payer: Self-pay

## 2019-09-30 DIAGNOSIS — I10 Essential (primary) hypertension: Secondary | ICD-10-CM | POA: Diagnosis not present

## 2019-09-30 DIAGNOSIS — K8020 Calculus of gallbladder without cholecystitis without obstruction: Secondary | ICD-10-CM | POA: Diagnosis not present

## 2019-09-30 DIAGNOSIS — F419 Anxiety disorder, unspecified: Secondary | ICD-10-CM | POA: Diagnosis not present

## 2019-09-30 DIAGNOSIS — R1032 Left lower quadrant pain: Secondary | ICD-10-CM | POA: Diagnosis not present

## 2019-09-30 MED ORDER — AMOXICILLIN-POT CLAVULANATE 875-125 MG PO TABS: 1.0000 | ORAL_TABLET | Freq: Two times a day (BID) | ORAL | 1 refills | Status: DC

## 2019-09-30 NOTE — Assessment & Plan Note (Signed)
   CT 09/10/19: Result Impression  IMPRESSION: 1. Sigmoid diverticulitis. No perforation or abscess formation. 2. Gallstones  Electronically Signed by: Caryl Never  Result Narrative  INDICATION: Left lower quadrant pain  TECHNIQUE: CT ABDOMEN PELVIS W IV CONTRAST. Radiation dose reduction was utilized (automated exposure control, mA or kV adjustment based on patient size, or iterative image reconstruction). Exam date/time: 09/10/2019 12:32 PM. Comparison none. 80 mL Isovue-370  FINDINGS: Lung bases: Clear.  Abdomen/pelvis: Spleen, bilateral adrenal glands, and pancreas are unremarkable. Gallstones. No biliary ductal dilatation. Liver cysts. Renal cysts. No solid renal mass or hydronephrosis. Uterus is present. No adnexal mass. Small free fluid. Diverticulitis of the proximal sigmoid colon. No perforation or abscess formation. Normal appendix.  Other Result Information  Acute Interface, Incoming Rad Results - 09/10/2019  1:51 PM EST INDICATION: Left lower quadrant pain  TECHNIQUE: CT ABDOMEN PELVIS W IV CONTRAST. Radiation dose reduction was utilized (automated exposure control, mA or kV adjustment based on patient size, or iterative image reconstruction). Exam date/time: 09/10/2019 12:32 PM.  Comparison none. 80 mL Isovue-370  FINDINGS: Lung bases: Clear.  Abdomen/pelvis: Spleen, bilateral adrenal glands, and pancreas are unremarkable. Gallstones. No biliary ductal dilatation. Liver cysts. Renal cysts. No solid renal mass or hydronephrosis. Uterus is present. No adnexal mass. Small free fluid. Diverticulitis of the proximal sigmoid colon. No perforation or abscess formation. Normal appendix.   IMPRESSION: 1. Sigmoid diverticulitis. No perforation or abscess formation. 2. Gallstones  Electronically Signed by: Caryl Never

## 2019-09-30 NOTE — Patient Instructions (Addendum)
CT 09/10/19: Result Impression  IMPRESSION: 1. Sigmoid diverticulitis. No perforation or abscess formation. 2. Gallstones  Electronically Signed by: Caryl Never  Result Narrative  INDICATION: Left lower quadrant pain  TECHNIQUE: CT ABDOMEN PELVIS W IV CONTRAST. Radiation dose reduction was utilized (automated exposure control, mA or kV adjustment based on patient size, or iterative image reconstruction). Exam date/time: 09/10/2019 12:32 PM. Comparison none. 80 mL Isovue-370  FINDINGS: Lung bases: Clear.  Abdomen/pelvis: Spleen, bilateral adrenal glands, and pancreas are unremarkable. Gallstones. No biliary ductal dilatation. Liver cysts. Renal cysts. No solid renal mass or hydronephrosis. Uterus is present. No adnexal mass. Small free fluid. Diverticulitis of the proximal sigmoid colon. No perforation or abscess formation. Normal appendix.  Other Result Information  Acute Interface, Incoming Rad Results - 09/10/2019  1:51 PM EST INDICATION: Left lower quadrant pain  TECHNIQUE: CT ABDOMEN PELVIS W IV CONTRAST. Radiation dose reduction was utilized (automated exposure control, mA or kV adjustment based on patient size, or iterative image reconstruction). Exam date/time: 09/10/2019 12:32 PM.  Comparison none. 80 mL Isovue-370  FINDINGS: Lung bases: Clear.  Abdomen/pelvis: Spleen, bilateral adrenal glands, and pancreas are unremarkable. Gallstones. No biliary ductal dilatation. Liver cysts. Renal cysts. No solid renal mass or hydronephrosis. Uterus is present. No adnexal mass. Small free fluid. Diverticulitis of the proximal sigmoid colon. No perforation or abscess formation. Normal appendix.   IMPRESSION: 1. Sigmoid diverticulitis. No perforation or abscess formation. 2. Gallstones  Electronically Signed by: Caryl Never     Diverticulitis  Diverticulitis is when small pockets in your large intestine (colon) get infected or swollen. This causes stomach pain and watery poop  (diarrhea). These pouches are called diverticula. They form in people who have a condition called diverticulosis. Follow these instructions at home: Medicines  Take over-the-counter and prescription medicines only as told by your doctor. These include: ? Antibiotics. ? Pain medicines. ? Fiber pills. ? Probiotics. ? Stool softeners.  Do not drive or use heavy machinery while taking prescription pain medicine.  If you were prescribed an antibiotic, take it as told. Do not stop taking it even if you feel better. General instructions   Follow a diet as told by your doctor.  When you feel better, your doctor may tell you to change your diet. You may need to eat a lot of fiber. Fiber makes it easier to poop (have bowel movements). Healthy foods with fiber include: ? Berries. ? Beans. ? Lentils. ? Green vegetables.  Exercise 3 or more times a week. Aim for 30 minutes each time. Exercise enough to sweat and make your heart beat faster.  Keep all follow-up visits as told. This is important. You may need to have an exam of the large intestine. This is called a colonoscopy. Contact a doctor if:  Your pain does not get better.  You have a hard time eating or drinking.  You are not pooping like normal. Get help right away if:  Your pain gets worse.  Your problems do not get better.  Your problems get worse very fast.  You have a fever.  You throw up (vomit) more than one time.  You have poop that is: ? Bloody. ? Black. ? Tarry. Summary  Diverticulitis is when small pockets in your large intestine (colon) get infected or swollen.  Take medicines only as told by your doctor.  Follow a diet as told by your doctor. This information is not intended to replace advice given to you by your health care provider. Make  sure you discuss any questions you have with your health care provider. Document Revised: 05/31/2017 Document Reviewed: 07/05/2016 Elsevier Patient Education   2020 [place] mass vaccine site in [place]:  Call the [facility] at [phone] to schedule your shot at [facility].   Sudie Grumbling. - [place]'s federal vaccine clinic is preparing to open on Wednesday 09/09/19. The [employer] site at [place] has the capacity to vaccinate 3,000 people a day for eight weeks. That's nearly 170,000 doses just from this one clinic.   With such a big operation, here are answers to some questions you may have about the drive-thru and indoor site:  How do I make an appointment?   Head to [email or web address] to schedule your appointment indoors or in the drive-thru, or call the [facility] at [phone].  What if I need to change or cancel my appointment, or have more questions?   Call the [facility] at [phone].  What vaccines will be available at the clinic?   The vaccine clinic will begin giving both Pfizer and Moderna two-dose COVID-19 vaccines. The single-dose [employer] vaccines will be given during the last two weeks of the clinic.   What part of the [place] do I enter for my appointment?  Enter from [employer] and turn onto [address]. A clinic staff member will confirm your appointment for the day. You'll then either be directed to registration or to a waiting area until your appointment time.  Can I be seen sooner if I'm early?  Those who are early will park in a designated waiting area until their vaccine appointment time approaches.  How does registration work?  There are five open lanes for registration. Someone will get the necessary information needed to confirm appointments and other details to keep a record of who's getting the vaccine every day. Temperatures will be checked to make sure you're in good shape to get the vaccine.  How long will it take to get my shot?  [place] park your car in a long  tent with about 10 other vehicles. All 10 people in that group will get a shot inside their vehicles. Getting the actual shot only takes a few minutes. The entire process takes about 30 minutes.  How does the observation period work?  All patients will wait in the same tent they got their vaccine at for 15 minutes.

## 2019-09-30 NOTE — Progress Notes (Addendum)
Subjective:  Patient ID: Courtney Nielsen, female    DOB: Nov 05, 1955  Age: 64 y.o. MRN: [id number]  CC: No chief complaint on file.   HPI Courtney Nielsen presents for diverticulitis and gallstones  F/u F/u anxiety, HTN  Outpatient Medications Prior to Visit  Medication Sig Dispense Refill  . acetaminophen (TYLENOL) 500 MG tablet Take 1,000 mg by mouth every 6 (six) hours as needed for moderate pain.    Marland Kitchen ALPRAZolam (XANAX) 0.5 MG tablet 1 po qhs prn insomnia (Patient taking differently: Take 0.5 mg by mouth at bedtime. ) 90 tablet 1  . amoxicillin-clavulanate (AUGMENTIN) 875-125 MG tablet Take 1 tablet by mouth 2 (two) times daily. 20 tablet 0  . aspirin EC 81 MG tablet Take 81 mg by mouth at bedtime.     . bumetanide (BUMEX) 1 MG tablet Take 1 tablet (1 mg total) by mouth daily as needed (Swelling). 30 tablet 1  . Cholecalciferol (VITAMIN D3) 50 MCG (2000 UT) TABS Take 2,000 Units by mouth at bedtime.    . irbesartan (AVAPRO) 300 MG tablet TAKE 1 TABLET BY MOUTH DAILY GENERIC EQUIVALENT FOR AVAPRO (Patient taking differently: Take 300 mg by mouth at bedtime. ) 90 tablet 3  . loratadine (CLARITIN) 10 MG tablet Take 10 mg by mouth at bedtime.    . Menthol, Topical Analgesic, (BLUE-EMU MAXIMUM STRENGTH EX) Apply 1 application topically daily.     . methocarbamol (ROBAXIN) 500 MG tablet Take 500 mg by mouth 3 (three) times daily as needed.    . Omega-3 Fatty Acids (FISH OIL) 500 MG CAPS Take 500 mg by mouth at bedtime.     Marland Kitchen oxybutynin (DITROPAN XL) 5 MG 24 hr tablet Take 1 tablet (5 mg total) by mouth at bedtime. 90 tablet 3  . PRESCRIPTION MEDICATION Place 1 drop into the left eye 3 (three) times daily. Pred-Gati-Brom (Prednisolone Acetate/Gatifloxacin/Bromfenac)    . traMADol (ULTRAM) 50 MG tablet Take 50-100 mg by mouth every 6 (six) hours as needed.    . traMADol-acetaminophen (ULTRACET) 37.5-325 MG tablet 1/2 or 1 tab po qid prn pain (Patient taking differently: Take 0.5 tablets by  mouth at bedtime as needed (for back pain.). ) 100 tablet 2  . vitamin B-12 (CYANOCOBALAMIN) 1000 MCG tablet Take 1,000 mcg by mouth at bedtime.     No facility-administered medications prior to visit.    ROS: Review of Systems  Objective:  BP (!) 146/84 (BP Location: Left Arm, Patient Position: Sitting, Cuff Size: Large)   Pulse 95   Temp 98.1 F (36.7 C) (Oral)   Ht 5' 10.5" (1.791 m)   Wt 223 lb (101.2 kg)   SpO2 98%   BMI 31.54 kg/m   BP Readings from Last 3 Encounters:  09/30/19 (!) 146/84  09/10/19 (!) 142/84  05/11/19 (!) 171/85    Wt Readings from Last 3 Encounters:  09/30/19 223 lb (101.2 kg)  09/10/19 234 lb 9.6 oz (106.4 kg)  04/15/19 237 lb (107.5 kg)    Physical Exam   CT 09/10/19: Result Impression  IMPRESSION: 1. Sigmoid diverticulitis. No perforation or abscess formation. 2. Gallstones  Electronically Signed by: Caryl Never  Result Narrative  INDICATION: Left lower quadrant pain  TECHNIQUE: CT ABDOMEN PELVIS W IV CONTRAST. Radiation dose reduction was utilized (automated exposure control, mA or kV adjustment based on patient size, or iterative image reconstruction). Exam date/time: 09/10/2019 12:32 PM. Comparison none. 80 mL Isovue-370  FINDINGS: Lung bases: Clear.  Abdomen/pelvis: Spleen, bilateral adrenal  glands, and pancreas are unremarkable. Gallstones. No biliary ductal dilatation. Liver cysts. Renal cysts. No solid renal mass or hydronephrosis. Uterus is present. No adnexal mass. Small free fluid. Diverticulitis of the proximal sigmoid colon. No perforation or abscess formation. Normal appendix.  Other Result Information  Acute Interface, Incoming Rad Results - 09/10/2019  1:51 PM EST INDICATION: Left lower quadrant pain  TECHNIQUE: CT ABDOMEN PELVIS W IV CONTRAST. Radiation dose reduction was utilized (automated exposure control, mA or kV adjustment based on patient size, or iterative image reconstruction). Exam date/time: 09/10/2019  12:32 PM.  Comparison none. 80 mL Isovue-370  FINDINGS: Lung bases: Clear.  Abdomen/pelvis: Spleen, bilateral adrenal glands, and pancreas are unremarkable. Gallstones. No biliary ductal dilatation. Liver cysts. Renal cysts. No solid renal mass or hydronephrosis. Uterus is present. No adnexal mass. Small free fluid. Diverticulitis of the proximal sigmoid colon. No perforation or abscess formation. Normal appendix.   IMPRESSION: 1. Sigmoid diverticulitis. No perforation or abscess formation. 2. Gallstones  Electronically Signed by: Caryl Never     Assessment & Plan:    Walker Kehr, MD

## 2019-10-14 ENCOUNTER — Ambulatory Visit: Payer: BC Managed Care – PPO | Admitting: Internal Medicine

## 2019-10-19 ENCOUNTER — Other Ambulatory Visit: Payer: Self-pay | Admitting: Internal Medicine

## 2019-10-28 ENCOUNTER — Encounter: Payer: Self-pay | Admitting: Internal Medicine

## 2019-10-28 DIAGNOSIS — F419 Anxiety disorder, unspecified: Secondary | ICD-10-CM | POA: Insufficient documentation

## 2019-10-28 NOTE — Assessment & Plan Note (Signed)
Losartan 

## 2019-10-28 NOTE — Assessment & Plan Note (Signed)
Alprazolam prn  Potential benefits of a long term benzodiazepines  use as well as potential risks  and complications were explained to the patient and were aknowledged. 

## 2019-11-11 ENCOUNTER — Other Ambulatory Visit: Payer: Self-pay

## 2019-11-11 ENCOUNTER — Encounter: Payer: Self-pay | Admitting: Internal Medicine

## 2019-11-11 ENCOUNTER — Ambulatory Visit: Payer: BC Managed Care – PPO | Admitting: Internal Medicine

## 2019-11-11 DIAGNOSIS — E538 Deficiency of other specified B group vitamins: Secondary | ICD-10-CM | POA: Diagnosis not present

## 2019-11-11 DIAGNOSIS — K8020 Calculus of gallbladder without cholecystitis without obstruction: Secondary | ICD-10-CM | POA: Diagnosis not present

## 2019-11-11 DIAGNOSIS — M545 Low back pain, unspecified: Secondary | ICD-10-CM

## 2019-11-11 DIAGNOSIS — I10 Essential (primary) hypertension: Secondary | ICD-10-CM

## 2019-11-11 DIAGNOSIS — K5790 Diverticulosis of intestine, part unspecified, without perforation or abscess without bleeding: Secondary | ICD-10-CM

## 2019-11-11 LAB — COMPREHENSIVE METABOLIC PANEL
ALT: 24 U/L (ref 0–35)
AST: 18 U/L (ref 0–37)
Albumin: 3.9 g/dL (ref 3.5–5.2)
Alkaline Phosphatase: 89 U/L (ref 39–117)
BUN: 11 mg/dL (ref 6–23)
CO2: 30 mEq/L (ref 19–32)
Calcium: 9.1 mg/dL (ref 8.4–10.5)
Chloride: 102 mEq/L (ref 96–112)
Creatinine, Ser: 0.97 mg/dL (ref 0.40–1.20)
GFR: 57.88 mL/min — ABNORMAL LOW (ref >60.00)
Glucose, Bld: 104 mg/dL — ABNORMAL HIGH (ref 70–99)
Potassium: 3.7 mEq/L (ref 3.5–5.1)
Sodium: 140 mEq/L (ref 135–145)
Total Bilirubin: 0.8 mg/dL (ref 0.2–1.2)
Total Protein: 6.6 g/dL (ref 6.0–8.3)

## 2019-11-11 LAB — CBC WITH DIFFERENTIAL/PLATELET
Basophils Absolute: 0.1 10*3/uL (ref 0.0–0.1)
Basophils Relative: 1 % (ref 0.0–3.0)
Eosinophils Absolute: 0.4 10*3/uL (ref 0.0–0.7)
Eosinophils Relative: 4.6 % (ref 0.0–5.0)
HCT: 40.3 % (ref 36.0–46.0)
Hemoglobin: 13.5 g/dL (ref 12.0–15.0)
Lymphocytes Relative: 35.2 % (ref 12.0–46.0)
Lymphs Abs: 3 10*3/uL (ref 0.7–4.0)
MCHC: 33.5 g/dL (ref 30.0–36.0)
MCV: 90.6 fl (ref 78.0–100.0)
Monocytes Absolute: 0.6 10*3/uL (ref 0.1–1.0)
Monocytes Relative: 6.7 % (ref 3.0–12.0)
Neutro Abs: 4.5 10*3/uL (ref 1.4–7.7)
Neutrophils Relative %: 52.5 % (ref 43.0–77.0)
Platelets: 271 10*3/uL (ref 150.0–400.0)
RBC: 4.44 Mil/uL (ref 3.87–5.11)
RDW: 15.2 % (ref 11.5–15.5)
WBC: 8.6 10*3/uL (ref 4.0–10.5)

## 2019-11-11 NOTE — Progress Notes (Signed)
Subjective:  Patient ID: Courtney Nielsen, female    DOB: January 24, 1956  Age: 64 y.o. MRN: [id number]  CC: No chief complaint on file.   HPI Courtney Nielsen presents for HTN, anxiety, edema, GS f/u F/u abd pain - much better  Outpatient Medications Prior to Visit  Medication Sig Dispense Refill  . acetaminophen (TYLENOL) 500 MG tablet Take 1,000 mg by mouth every 6 (six) hours as needed for moderate pain.    Marland Kitchen ALPRAZolam (XANAX) 0.5 MG tablet TAKE 1 TABLET AT BEDTIME AS NEEDED FOR SLEEP 30 tablet 3  . aspirin EC 81 MG tablet Take 81 mg by mouth at bedtime.     . bumetanide (BUMEX) 1 MG tablet Take 1 tablet (1 mg total) by mouth daily as needed (Swelling). 30 tablet 1  . Cholecalciferol (VITAMIN D3) 50 MCG (2000 UT) TABS Take 2,000 Units by mouth at bedtime.    . irbesartan (AVAPRO) 300 MG tablet TAKE 1 TABLET BY MOUTH DAILY GENERIC EQUIVALENT FOR AVAPRO (Patient taking differently: Take 300 mg by mouth at bedtime. ) 90 tablet 3  . loratadine (CLARITIN) 10 MG tablet Take 10 mg by mouth at bedtime.    . Menthol, Topical Analgesic, (BLUE-EMU MAXIMUM STRENGTH EX) Apply 1 application topically daily.     . Omega-3 Fatty Acids (FISH OIL) 500 MG CAPS Take 500 mg by mouth at bedtime.     . traMADol (ULTRAM) 50 MG tablet Take 50-100 mg by mouth every 6 (six) hours as needed.    . vitamin B-12 (CYANOCOBALAMIN) 1000 MCG tablet Take 1,000 mcg by mouth at bedtime.    Marland Kitchen amoxicillin-clavulanate (AUGMENTIN) 875-125 MG tablet Take 1 tablet by mouth 2 (two) times daily. 28 tablet 1  . methocarbamol (ROBAXIN) 500 MG tablet Take 500 mg by mouth 3 (three) times daily as needed.    Marland Kitchen oxybutynin (DITROPAN XL) 5 MG 24 hr tablet Take 1 tablet (5 mg total) by mouth at bedtime. 90 tablet 3  . PRESCRIPTION MEDICATION Place 1 drop into the left eye 3 (three) times daily. Pred-Gati-Brom (Prednisolone Acetate/Gatifloxacin/Bromfenac)     No facility-administered medications prior to visit.    ROS: Review of  Systems  Constitutional: Negative for activity change, appetite change, chills, fatigue and unexpected weight change.  HENT: Negative for congestion, mouth sores and sinus pressure.   Eyes: Negative for visual disturbance.  Respiratory: Negative for cough and chest tightness.   Gastrointestinal: Positive for abdominal pain. Negative for nausea.  Genitourinary: Negative for difficulty urinating, frequency and vaginal pain.  Musculoskeletal: Positive for arthralgias and back pain. Negative for gait problem.  Skin: Negative for pallor and rash.  Neurological: Negative for dizziness, tremors, weakness, numbness and headaches.  Psychiatric/Behavioral: Negative for confusion, sleep disturbance and suicidal ideas.    Objective:  BP 128/78 (BP Location: Left Arm, Patient Position: Sitting, Cuff Size: Large)   Pulse 89   Temp 97.9 F (36.6 C) (Oral)   Ht 5' 10.5" (1.791 m)   Wt 231 lb (104.8 kg)   SpO2 94%   BMI 32.68 kg/m   BP Readings from Last 3 Encounters:  11/11/19 128/78  09/30/19 (!) 146/84  09/10/19 (!) 142/84    Wt Readings from Last 3 Encounters:  11/11/19 231 lb (104.8 kg)  09/30/19 223 lb (101.2 kg)  09/10/19 234 lb 9.6 oz (106.4 kg)    Physical Exam Constitutional:      General: She is not in acute distress.    Appearance: She is well-developed. She  is obese.  HENT:     Head: Normocephalic.     Right Ear: External ear normal.     Left Ear: External ear normal.     Nose: Nose normal.  Eyes:     General:        Right eye: No discharge.        Left eye: No discharge.     Conjunctiva/sclera: Conjunctivae normal.     Pupils: Pupils are equal, round, and reactive to light.  Neck:     Thyroid: No thyromegaly.     Vascular: No JVD.     Trachea: No tracheal deviation.  Cardiovascular:     Rate and Rhythm: Normal rate and regular rhythm.     Heart sounds: Normal heart sounds.  Pulmonary:     Effort: No respiratory distress.     Breath sounds: No stridor. No  wheezing.  Abdominal:     General: Bowel sounds are normal. There is no distension.     Palpations: Abdomen is soft. There is no mass.     Tenderness: There is abdominal tenderness. There is no guarding or rebound.  Musculoskeletal:        General: Tenderness present.     Cervical back: Normal range of motion and neck supple.  Lymphadenopathy:     Cervical: No cervical adenopathy.  Skin:    Findings: No erythema or rash.  Neurological:     Cranial Nerves: No cranial nerve deficit.     Motor: No abnormal muscle tone.     Coordination: Coordination normal.     Deep Tendon Reflexes: Reflexes normal.  Psychiatric:        Behavior: Behavior normal.        Thought Content: Thought content normal.        Judgment: Judgment normal.    LLQ is sensitive   Lab Results  Component Value Date   WBC 7.5 08/03/2019   HGB 14.1 08/03/2019   HCT 42.6 08/03/2019   PLT 244.0 08/03/2019   GLUCOSE 85 08/03/2019   CHOL 193 04/08/2019   TRIG 98.0 04/08/2019   HDL 45.90 04/08/2019   LDLDIRECT 164.6 01/05/2008   LDLCALC 128 (H) 04/08/2019   ALT 19 04/08/2019   AST 13 04/08/2019   NA 141 08/03/2019   K 3.8 08/03/2019   CL 104 08/03/2019   CREATININE 1.16 08/03/2019   BUN 14 08/03/2019   CO2 27 08/03/2019   TSH 1.02 04/08/2019   INR 0.9 08/03/2019   HGBA1C 5.6 03/02/2014    No results found.  Assessment & Plan:   There are no diagnoses linked to this encounter.   No orders of the defined types were placed in this encounter.    Follow-up: No follow-ups on file.  Walker Kehr, MD

## 2019-11-11 NOTE — Assessment & Plan Note (Signed)
H/o diverticulitis

## 2019-11-11 NOTE — Assessment & Plan Note (Signed)
On B12 

## 2019-11-11 NOTE — Assessment & Plan Note (Signed)
F/u w/dr Maryjean Ka - injections

## 2019-11-11 NOTE — Assessment & Plan Note (Addendum)
No pain now Ref to surgery

## 2019-11-11 NOTE — Assessment & Plan Note (Signed)
Losartan po

## 2019-12-23 DIAGNOSIS — K8020 Calculus of gallbladder without cholecystitis without obstruction: Secondary | ICD-10-CM | POA: Diagnosis not present

## 2019-12-24 DIAGNOSIS — M47816 Spondylosis without myelopathy or radiculopathy, lumbar region: Secondary | ICD-10-CM | POA: Diagnosis not present

## 2020-01-07 ENCOUNTER — Encounter: Payer: Self-pay | Admitting: Cardiology

## 2020-01-07 ENCOUNTER — Ambulatory Visit: Payer: BC Managed Care – PPO | Admitting: Cardiology

## 2020-01-07 ENCOUNTER — Other Ambulatory Visit: Payer: Self-pay

## 2020-01-07 VITALS — BP 130/73 | HR 68 | Temp 96.8°F | Ht 70.0 in | Wt 230.4 lb

## 2020-01-07 DIAGNOSIS — I10 Essential (primary) hypertension: Secondary | ICD-10-CM

## 2020-01-07 DIAGNOSIS — R600 Localized edema: Secondary | ICD-10-CM

## 2020-01-07 DIAGNOSIS — I5189 Other ill-defined heart diseases: Secondary | ICD-10-CM | POA: Diagnosis not present

## 2020-01-07 DIAGNOSIS — Z7189 Other specified counseling: Secondary | ICD-10-CM

## 2020-01-07 NOTE — Progress Notes (Signed)
Cardiology Office Note:    Date:  01/07/2020   ID:  Courtney Nielsen, DOB 1955-11-17, MRN [id number]  PCP:  Courtney Anger, MD  Cardiologist:  Buford Dresser, MD PhD  Referring MD: Courtney Anger, MD   CC: follow up  History of Present Illness:    Courtney Nielsen is a 64 y.o. female with a hx of COPD, hypertension who is seen for follow up. I initially saw her 12/26/18 as a new consult at the request of Plotnikov, Evie Lacks, MD for the evaluation and management of shortness of breath, edema, and chest pain.  Cardiac history: Echocardiogram 12/24/18 showed EF 09-38%, grade 1 diastolic dysfunction. No significant valve disease. Labs showed Cr 1.03, nl TSH, largely nl CBC (borderline eosinophils), normal LFTs, normal D dimer, normal BNP of 12.  Today: No episodes of chest pain or shortness of breath. Hasn't done any high exertional activity in the last period of time. Did well after hip surgery, completed PT. Trying to get back to regular walking activity. Has been dealing with gallstones and knee pain recently, which has limited her.   No ankle/feet swelling after she gets her nerve block, and then returns after a couple of months. Takes bumex only when she is swollen. Responds well to bumex. She has a new mail order service, doesn't remember the company, but due for a refill. She will give [place] the information and we will send in a new prescription.  Denies chest pain, shortness of breath at rest or with normal exertion. No PND, orthopnea, LE edema or unexpected weight gain. No syncope or palpitations.  Past Medical History:  Diagnosis Date  . Allergic rhinitis   . Allergy   . Arthritis   . Cataract    right eye removed with lens implant   . Chest pain   . Colonic polyp   . COPD (chronic obstructive pulmonary disease) ([place])    patient denies at preop on ;   . Dysrhythmia    hx of every third beat is " off" per patient   . Hypertension   . Pneumonia    hx of  2014   . Smoker   . Vitamin B12 deficiency     Past Surgical History:  Procedure Laterality Date  . CARDIAC CATHETERIZATION     cath negative per patient   . CATARACT EXTRACTION W/PHACO Right 06/11/2016   Procedure: CATARACT EXTRACTION PHACO AND INTRAOCULAR LENS PLACEMENT RIGHT EYE CDE=6.15;  Surgeon: Tonny Branch, MD;  Location: [facility];  Service: Ophthalmology;  Laterality: Right;  right  . CATARACT EXTRACTION W/PHACO Left 05/11/2019   Procedure: CATARACT EXTRACTION PHACO AND INTRAOCULAR LENS PLACEMENT LEFT EYE CDE=5.67;  Surgeon: Baruch Goldmann, MD;  Location: [facility];  Service: Ophthalmology;  Laterality: Left;  left  . COLONOSCOPY    . KNEE ARTHROSCOPY Right 10/19/2015   Procedure: RIGHT KNEE SCOPE WITH MENISCAL DEBRIDEMENT,LATERAL SYNOVECTOMY;  Surgeon: Gaynelle Arabian, MD;  Location: [facility];  Service: Orthopedics;  Laterality: Right;  . POLYPECTOMY    . TOTAL KNEE ARTHROPLASTY Right 12/03/2016   Procedure: RIGHT TOTAL KNEE ARTHROPLASTY;  Surgeon: Gaynelle Arabian, MD;  Location: [facility];  Service: Orthopedics;  Laterality: Right;    Current Medications: Current Outpatient Medications on File Prior to Visit  Medication Sig  . acetaminophen (TYLENOL) 500 MG tablet Take 1,000 mg by mouth every 6 (six) hours as needed for moderate pain.  Marland Kitchen ALPRAZolam (XANAX) 0.5 MG tablet TAKE 1 TABLET AT BEDTIME AS NEEDED FOR SLEEP  .  aspirin EC 81 MG tablet Take 81 mg by mouth at bedtime.   . bumetanide (BUMEX) 1 MG tablet Take 1 tablet (1 mg total) by mouth daily as needed (Swelling).  . Cholecalciferol (VITAMIN D3) 50 MCG (2000 UT) TABS Take 2,000 Units by mouth at bedtime.  . irbesartan (AVAPRO) 300 MG tablet TAKE 1 TABLET BY MOUTH DAILY GENERIC EQUIVALENT FOR AVAPRO (Patient taking differently: Take 300 mg by mouth at bedtime. )  . loratadine (CLARITIN) 10 MG tablet Take 10 mg by mouth at bedtime.  . Menthol, Topical Analgesic, (BLUE-EMU MAXIMUM STRENGTH EX) Apply 1 application topically daily.   .  Omega-3 Fatty Acids (FISH OIL) 500 MG CAPS Take 500 mg by mouth at bedtime.   . traMADol (ULTRAM) 50 MG tablet Take 50-100 mg by mouth every 6 (six) hours as needed.  . vitamin B-12 (CYANOCOBALAMIN) 1000 MCG tablet Take 1,000 mcg by mouth at bedtime.   No current facility-administered medications on file prior to visit.     Allergies:   Meloxicam, Dilaudid [hydromorphone hcl], Levaquin [levofloxacin], Ceftin [cefuroxime axetil], Coreg [carvedilol], Flagyl [metronidazole], Furosemide, Prednisone, Sulfonamide derivatives, and Verapamil   Social History   Tobacco Use  . Smoking status: Former Smoker    Packs/day: 1.00    Years: 20.00    Pack years: 20.00    Types: Cigarettes    Quit date: 07/03/2011    Years since quitting: 8.5  . Smokeless tobacco: Never Used  Vaping Use  . Vaping Use: Never used  Substance Use Topics  . Alcohol use: No  . Drug use: No    Family History: The patient's family history includes Arthritis in her father and mother; Asthma in her brother; COPD in her father; Cancer in her father; Diabetes in her maternal aunt; Emphysema in her paternal grandfather; Esophageal cancer in her father; Heart disease in her mother; Hypertension in an other family member; Lung cancer in her father; Lymphoma in her father. There is no history of Colon cancer, Colon polyps, Rectal cancer, or Stomach cancer.  ROS:   Please see the history of present illness.  Additional pertinent ROS otherwise unremarkable.   EKGs/Labs/Other Studies Reviewed:    The following studies were reviewed today: Echo 12/24/18  1. The left ventricle has normal systolic function, with an ejection fraction of 55-60%. The cavity size was normal. There is mildly increased left ventricular wall thickness. Left ventricular diastolic Doppler parameters are consistent with impaired  relaxation.  2. The right ventricle has normal systolic function. The cavity was normal.  3. The mitral valve is grossly normal.  4.  The tricuspid valve is grossly normal.  5. The aortic valve is tricuspid. No stenosis of the aortic valve.  6. Normal LV systolic function; mild diastolic dysfunction; mild LVH.  EKG:  EKG is personally reviewed.  The ekg ordered today demonstrates NSR at 66 bpm  Recent Labs: 04/08/2019: TSH 1.02 11/11/2019: ALT 24; BUN 11; Creatinine, Ser 0.97; Hemoglobin 13.5; Platelets 271.0; Potassium 3.7; Sodium 140  Recent Lipid Panel    Component Value Date/Time   CHOL 193 04/08/2019 0858   TRIG 98.0 04/08/2019 0858   HDL 45.90 04/08/2019 0858   CHOLHDL 4 04/08/2019 0858   VLDL 19.6 04/08/2019 0858   LDLCALC 128 (H) 04/08/2019 0858   LDLDIRECT 164.6 01/05/2008 0844    Physical Exam:    VS:  BP 130/73   Pulse 68   Temp (!) 96.8 F (36 C)   Ht 5\' 10"  (1.778 m)  Wt 230 lb 6.4 oz (104.5 kg)   SpO2 98%   BMI 33.06 kg/m     Wt Readings from Last 3 Encounters:  01/07/20 230 lb 6.4 oz (104.5 kg)  11/11/19 231 lb (104.8 kg)  09/30/19 223 lb (101.2 kg)    GEN: Well nourished, well developed in no acute distress HEENT: Normal, moist mucous membranes NECK: No JVD CARDIAC: regular rhythm, normal S1 and S2, no rubs or gallops. No murmur. VASCULAR: Radial and DP pulses 2+ bilaterally. No carotid bruits RESPIRATORY:  Clear to auscultation without rales, wheezing or rhonchi  ABDOMEN: Soft, non-tender, non-distended MUSCULOSKELETAL:  Ambulates independently SKIN: Warm and dry, trace bilateral LE edema NEUROLOGIC:  Alert and oriented x 3. No focal neuro deficits noted. PSYCHIATRIC:  Normal affect   ASSESSMENT:    1. Essential hypertension   2. Bilateral leg edema   3. Encounter for education about heart failure   4. Cardiac risk counseling   5. Counseling on health promotion and disease prevention   6. Diastolic dysfunction    PLAN:    Shortness of breath, bilateral LE edema, episode of chest tightness: improving with Bumex. Trace remaining LE edema. -echo with normal LV EF, grade 1  DD -BNP normal. While her BMI is 33, a BNP of 12 would still be well within normal range with mild obesity -chest tightness resolved with single dose of Bumex. Not exertional. With normal LV EF and low ASCVD risk score, low suspicion for ischemia as etiology. She will contact us if her symptoms change -BMET, TSH, CBC, LFTs, d-dimer WNL -we have discussed both chronic venous insufficiency and diastolic heart failure. Continue compression stockings, elevation, fluid management, salt avoidance as part of heart failure education -reviewed bumex dosing  Hypertension: goal <130/80. Just at goal today -on irbesartan, continue -could consider adding chlorthalidone if BP rises--in which case, may not require bumex  Cardiac risk counseling and prevention recommendations: -recommend heart healthy/Mediterranean diet, with whole grains, fruits, vegetable, fish, lean meats, nuts, and olive oil. Limit salt. -recommend moderate walking, 3-5 times/week for 30-50 minutes each session. Aim for at least 150 minutes.week. Goal should be pace of 3 miles/hours, or walking 1.5 miles in 30 minutes -recommend avoidance of tobacco products. Avoid excess alcohol. -Additional risk factor control:  -Diabetes: A1c is not available, denies history of diabetes  -Lipids: Tchol 183, HDL 42, LDL 125  -Blood pressure control: as above  -Weight: BMI 33. Would benefit from weight loss -ASCVD risk score: The 10-year ASCVD risk score Mikey Bussing DC Jr., et al., 2013) is: 6.7%   Values used to calculate the score:     Age: 48 years     Sex: Female     Is Non-Hispanic African American: No     Diabetic: No     Tobacco smoker: No     Systolic Blood Pressure: 454 mmHg     Is BP treated: Yes     HDL Cholesterol: 45.9 mg/dL     Total Cholesterol: 193 mg/dL   -on aspirin 81 mg per personal preference, we have discussed guidelines -not on statin, with ASCVD risk as above and no ASCVD history or diabetes, would focus on lifestyle  management first. Aim for LDL closer to 100 if possible.  Plan for follow up: 1 year or sooner PRN  Medication Adjustments/Labs and Tests Ordered: Current medicines are reviewed at length with the patient today.  Concerns regarding medicines are outlined above.  Orders Placed This Encounter  Procedures  . EKG 12-Lead  No orders of the defined types were placed in this encounter.   Patient Instructions  Medication Instructions:  Your Physician recommend you continue on your current medication as directed.    *If you need a refill on your cardiac medications before your next appointment, please call your pharmacy*   Lab Work: None  Testing/Procedures: None   Follow-Up: At [facility], you and your health needs are our priority.  As part of our continuing mission to provide you with exceptional heart care, we have created designated Provider Care Teams.  These Care Teams include your primary Cardiologist (physician) and Advanced Practice Providers (APPs -  Physician Assistants and Nurse Practitioners) who all work together to provide you with the care you need, when you need it.  We recommend signing up for the patient portal called "MyChart".  Sign up information is provided on this After Visit Summary.  MyChart is used to connect with patients for Virtual Visits (Telemedicine).  Patients are able to view lab/test results, encounter notes, upcoming appointments, etc.  Non-urgent messages can be sent to your provider as well.   To learn more about what you can do with MyChart, go to [email or web address].    Your next appointment:   1 year(s)  The format for your next appointment:   In Person  Provider:   Buford Dresser, MD       Signed, Buford Dresser, MD PhD 01/07/2020 [place]

## 2020-01-07 NOTE — Patient Instructions (Signed)

## 2020-01-08 MED ORDER — BUMETANIDE 1 MG PO TABS: 1.0000 mg | ORAL_TABLET | Freq: Every day | ORAL | 1 refills | Status: DC | PRN

## 2020-01-29 DIAGNOSIS — M47816 Spondylosis without myelopathy or radiculopathy, lumbar region: Secondary | ICD-10-CM | POA: Diagnosis not present

## 2020-01-29 DIAGNOSIS — M4316 Spondylolisthesis, lumbar region: Secondary | ICD-10-CM | POA: Diagnosis not present

## 2020-01-29 DIAGNOSIS — M5126 Other intervertebral disc displacement, lumbar region: Secondary | ICD-10-CM | POA: Diagnosis not present

## 2020-02-05 DIAGNOSIS — K8020 Calculus of gallbladder without cholecystitis without obstruction: Secondary | ICD-10-CM | POA: Diagnosis not present

## 2020-02-16 NOTE — Progress Notes (Signed)
DUE TO COVID-19 ONLY ONE VISITOR IS ALLOWED TO COME WITH YOU AND STAY IN THE WAITING ROOM ONLY DURING PRE OP AND PROCEDURE DAY OF SURGERY. THE 1 VISITOR  MAY VISIT WITH YOU AFTER SURGERY IN YOUR PRIVATE ROOM DURING VISITING HOURS ONLY!  YOU NEED TO HAVE A COVID 19 TEST ON_______ @_______ , THIS TEST MUST BE DONE BEFORE SURGERY,  COVID TESTING SITE [address] [place] [address], IT IS ON THE RIGHT GOING OUT [address] APPROXIMATELY  2 MINUTES PAST [employer] ON THE RIGHT. ONCE YOUR COVID TEST IS COMPLETED,  PLEASE BEGIN THE QUARANTINE INSTRUCTIONS AS OUTLINED IN YOUR HANDOUT.                Courtney Nielsen  02/16/2020   Your procedure is scheduled on:  02/22/2020   Report to [facility] Main  Entrance   Report to admitting at      100pm     Call this number if you have problems the morning of surgery [phone]    Remember: Do not eat food   :After Midnight. BRUSH YOUR TEETH MORNING OF SURGERY AND RINSE YOUR MOUTH OUT, NO CHEWING GUM CANDY OR MINTS.  May have clear liquids from 83midnite until 1200noon day of surgery then nothing by mouth.     CLEAR LIQUID DIET   Foods Allowed                                                                      Coffee and tea, regular and decaf                           Plain Jell-O any favor except red or purple                                           through  Fruit ices (not with fruit pulp)                                Iced Popsicles                                  Carbonated beverages, regular and diet                                    Cranberry, grape and apple juices Sports drinks like Gatorade Lightly seasoned clear broth or consume(fat free) Sugar, honey syrup                    _____________________________________________________________________    Take these medicines the morning of surgery with A SIP OF WATER:  None                                 You may not have any metal on your  body including hair pins  and              piercings  Do not wear jewelry, make-up, lotions, powders or perfumes, deodorant             Do not wear nail polish on your fingernails.  Do not shave  48 hours prior to surgery.              Men may shave face and neck.   Do not bring valuables to the hospital. [place].  Contacts, dentures or bridgework may not be worn into surgery.  Leave suitcase in the car. After surgery it may be brought to your room.     Patients discharged the day of surgery will not be allowed to drive home. IF YOU ARE HAVING SURGERY AND GOING HOME THE SAME DAY, YOU MUST HAVE AN ADULT TO DRIVE YOU HOME AND BE WITH YOU FOR 24 HOURS. YOU MAY GO HOME BY TAXI OR UBER OR ORTHERWISE, BUT AN ADULT MUST ACCOMPANY YOU HOME AND STAY WITH YOU FOR 24 HOURS.  Name and phone number of your driver:  Special Instructions: N/A              Please read over the following fact sheets you were given: _____________________________________________________________________             [facility] - Preparing for Surgery Before surgery, you can play an important role.  Because skin is not sterile, your skin needs to be as free of germs as possible.  You can reduce the number of germs on your skin by washing with CHG (chlorahexidine gluconate) soap before surgery.  CHG is an antiseptic cleaner which kills germs and bonds with the skin to continue killing germs even after washing. Please DO NOT use if you have an allergy to CHG or antibacterial soaps.  If your skin becomes reddened/irritated stop using the CHG and inform your nurse when you arrive at Short Stay. Do not shave (including legs and underarms) for at least 48 hours prior to the first CHG shower.  You may shave your face/neck. Please follow these instructions carefully:  1.  Shower with CHG Soap the night before surgery and the  morning of Surgery.  2.  If you choose to wash your hair,  wash your hair first as usual with your  normal  shampoo.  3.  After you shampoo, rinse your hair and body thoroughly to remove the  shampoo.                           4.  Use CHG as you would any other liquid soap.  You can apply chg directly  to the skin and wash                       Gently with a scrungie or clean washcloth.  5.  Apply the CHG Soap to your body ONLY FROM THE NECK DOWN.   Do not use on face/ open                           Wound or open sores. Avoid contact with eyes, ears mouth and genitals (private parts).  Wash face,  Genitals (private parts) with your normal soap.             6.  Wash thoroughly, paying special attention to the area where your surgery  will be performed.  7.  Thoroughly rinse your body with warm water from the neck down.  8.  DO NOT shower/wash with your normal soap after using and rinsing off  the CHG Soap.                9.  Pat yourself dry with a clean towel.            10.  Wear clean pajamas.            11.  Place clean sheets on your bed the night of your first shower and do not  sleep with pets. Day of Surgery : Do not apply any lotions/deodorants the morning of surgery.  Please wear clean clothes to the hospital/surgery center.  FAILURE TO FOLLOW THESE INSTRUCTIONS MAY RESULT IN THE CANCELLATION OF YOUR SURGERY PATIENT SIGNATURE_________________________________  NURSE SIGNATURE__________________________________  ________________________________________________________________________

## 2020-02-18 ENCOUNTER — Other Ambulatory Visit: Payer: Self-pay

## 2020-02-18 ENCOUNTER — Other Ambulatory Visit (HOSPITAL_COMMUNITY)
Admission: RE | Admit: 2020-02-18 | Discharge: 2020-02-18 | Disposition: A | Discharge status: Home / Self Care | Payer: BC Managed Care – PPO | Source: Ambulatory Visit | Attending: Surgery | Admitting: Surgery

## 2020-02-18 ENCOUNTER — Encounter (HOSPITAL_COMMUNITY)
Admission: RE | Admit: 2020-02-18 | Discharge: 2020-02-18 | Disposition: A | Discharge status: Home / Self Care | Payer: BC Managed Care – PPO | Source: Ambulatory Visit | Attending: Surgery | Admitting: Surgery

## 2020-02-18 ENCOUNTER — Encounter (HOSPITAL_COMMUNITY): Payer: Self-pay

## 2020-02-18 DIAGNOSIS — Z01812 Encounter for preprocedural laboratory examination: Secondary | ICD-10-CM | POA: Diagnosis not present

## 2020-02-18 DIAGNOSIS — Z20822 Contact with and (suspected) exposure to covid-19: Secondary | ICD-10-CM | POA: Insufficient documentation

## 2020-02-18 HISTORY — DX: Family history of other specified conditions: Z84.89

## 2020-02-18 LAB — CBC
HCT: 43.1 % (ref 36.0–46.0)
Hemoglobin: 14.2 g/dL (ref 12.0–15.0)
MCH: 31.2 pg (ref 26.0–34.0)
MCHC: 32.9 g/dL (ref 30.0–36.0)
MCV: 94.7 fL (ref 80.0–100.0)
Platelets: 256 10*3/uL (ref 150–400)
RBC: 4.55 MIL/uL (ref 3.87–5.11)
RDW: 13.5 % (ref 11.5–15.5)
WBC: 8.2 10*3/uL (ref 4.0–10.5)
nRBC: 0 % (ref 0.0–0.2)

## 2020-02-18 LAB — BASIC METABOLIC PANEL
Anion gap: 10 (ref 5–15)
BUN: 13 mg/dL (ref 8–23)
CO2: 26 mmol/L (ref 22–32)
Calcium: 9.3 mg/dL (ref 8.9–10.3)
Chloride: 106 mmol/L (ref 98–111)
Creatinine, Ser: 1.02 mg/dL — ABNORMAL HIGH (ref 0.44–1.00)
GFR calc Af Amer: >60 mL/min (ref >60)
GFR calc non Af Amer: 58 mL/min — ABNORMAL LOW (ref >60)
Glucose, Bld: 103 mg/dL — ABNORMAL HIGH (ref 70–99)
Potassium: 4.4 mmol/L (ref 3.5–5.1)
Sodium: 142 mmol/L (ref 135–145)

## 2020-02-18 LAB — SARS CORONAVIRUS 2 (TAT 6-24 HRS): SARS Coronavirus 2: NEGATIVE

## 2020-02-18 NOTE — Progress Notes (Signed)
Anesthesia Chart Review   Case: [id number] Date/Time: 02/22/20 1445   Procedure: LAPAROSCOPIC CHOLECYSTECTOMY WITH INTRAOPERATIVE CHOLANGIOGRAM (N/A )   Anesthesia type: General   Pre-op diagnosis: CHOLECYSTITIS   Location: [facility] ROOM 02 / [facility]   Surgeons: Johnathan Hausen, MD      DISCUSSION:63 y.o. former smoker (20 pack years, quit 07/03/11) with h/o HTN, COPD, cholecystitis scheduled for above procedure 02/22/2020 with Dr. Johnathan Hausen.   Pt last seen by cardiology 01/07/2020.  Per OV note shortness of breath, bilateral LE edema, and episode of nonexertional chest tightness improved with Bumex.  Echo 12/24/2018 with normal EF, grade I DD.  "chest tightness resolved with single dose of Bumex. Not exertional. With normal LV EF and low ASCVD risk score, low suspicion for ischemia as etiology. She will contact us if her symptoms change. suspect this may be related to some neuropathy/venous insufficiency given the description that it waxes and wanes with nerve injections. May be that when she hits a tipping point with fluid she had some mild diastolic heart failure that causes her shortness of breath."  Pt advised to follow up in 1 year.    Last seen by PCP 11/11/2019.  Stable at this visit.   VS: There were no vitals taken for this visit.  PROVIDERS: Plotnikov, Evie Lacks, MD is PCP   Buford Dresser, MD is Cardiologist  LABS: Labs reviewed: Acceptable for surgery. (all labs ordered are listed, but only abnormal results are displayed)  Labs Reviewed  BASIC METABOLIC PANEL - Abnormal; Notable for the following components:      Result Value   Glucose, Bld 103 (*)    Creatinine, Ser 1.02 (*)    GFR calc non Af Amer 58 (*)    All other components within normal limits  CBC     IMAGES:   EKG: 01/07/2020 Rate 66 bpm NSR  CV: Echo 12/24/2018 IMPRESSIONS    1. The left ventricle has normal systolic function, with an ejection  fraction of 55-60%. The cavity size was normal. There  is mildly increased  left ventricular wall thickness. Left ventricular diastolic Doppler  parameters are consistent with impaired  relaxation.  2. The right ventricle has normal systolic function. The cavity was  normal.  3. The mitral valve is grossly normal.  4. The tricuspid valve is grossly normal.  5. The aortic valve is tricuspid. No stenosis of the aortic valve.  6. Normal LV systolic function; mild diastolic dysfunction; mild LVH. Past Medical History:  Diagnosis Date  . Allergic rhinitis   . Allergy   . Arthritis   . Cataract    right eye removed with lens implant   . Chest pain   . Colonic polyp   . COPD (chronic obstructive pulmonary disease) ([place])    patient denies at preop on ;   . Dysrhythmia    hx of every third beat is " off" per patient   . Family history of adverse reaction to anesthesia    mother slow to wake up   . Hypertension   . Pneumonia    hx of 2014   . Smoker   . Vitamin B12 deficiency     Past Surgical History:  Procedure Laterality Date  . CARDIAC CATHETERIZATION     cath negative per patient   . CATARACT EXTRACTION W/PHACO Right 06/11/2016   Procedure: CATARACT EXTRACTION PHACO AND INTRAOCULAR LENS PLACEMENT RIGHT EYE CDE=6.15;  Surgeon: Tonny Branch, MD;  Location: [facility];  Service: Ophthalmology;  Laterality: Right;  right  . CATARACT EXTRACTION W/PHACO Left 05/11/2019   Procedure: CATARACT EXTRACTION PHACO AND INTRAOCULAR LENS PLACEMENT LEFT EYE CDE=5.67;  Surgeon: Baruch Goldmann, MD;  Location: [facility];  Service: Ophthalmology;  Laterality: Left;  left  . COLONOSCOPY    . JOINT REPLACEMENT     left hip - 08/2019   . KNEE ARTHROSCOPY Right 10/19/2015   Procedure: RIGHT KNEE SCOPE WITH MENISCAL DEBRIDEMENT,LATERAL SYNOVECTOMY;  Surgeon: Gaynelle Arabian, MD;  Location: [facility];  Service: Orthopedics;  Laterality: Right;  . POLYPECTOMY    . TOTAL KNEE ARTHROPLASTY Right 12/03/2016   Procedure: RIGHT TOTAL KNEE ARTHROPLASTY;  Surgeon: Gaynelle Arabian, MD;  Location: [facility];  Service: Orthopedics;  Laterality: Right;    MEDICATIONS: . acetaminophen (TYLENOL) 500 MG tablet  . ALPRAZolam (XANAX) 0.5 MG tablet  . aspirin EC 81 MG tablet  . bumetanide (BUMEX) 1 MG tablet  . Cholecalciferol (VITAMIN D3) 50 MCG (2000 UT) TABS  . irbesartan (AVAPRO) 300 MG tablet  . loratadine (CLARITIN) 10 MG tablet  . Menthol, Topical Analgesic, (BLUE-EMU MAXIMUM STRENGTH EX)  . Omega-3 Fatty Acids (FISH OIL) 500 MG CAPS  . traMADol (ULTRAM) 50 MG tablet  . vitamin B-12 (CYANOCOBALAMIN) 1000 MCG tablet   No current facility-administered medications for this encounter.    Courtney Felix, PA-C [facility] [phone]

## 2020-02-18 NOTE — Progress Notes (Signed)
Anesthesia Review:  PCP: Plotnikov- LOV 11/11/19  Cardiologist : Bridgette christopher 01/07/20 LOV  Chest x-ray : EKG :01/02/20  Echo :12/24/18  Stress test: Cardiac Cath :  Activity level: had hip replacement 2/21 has not really had to do any steps but believes could walk steps without difficulty  Sleep Study/ CPAP : Fasting Blood Sugar :      / Checks Blood Sugar -- times a day:   Blood Thinner/ Instructions /Last Dose: ASA / Instructions/ Last Dose :

## 2020-02-19 ENCOUNTER — Other Ambulatory Visit: Payer: Self-pay | Admitting: Internal Medicine

## 2020-02-19 DIAGNOSIS — I10 Essential (primary) hypertension: Secondary | ICD-10-CM

## 2020-02-21 MED ORDER — BUPIVACAINE LIPOSOME 1.3 % IJ SUSP
20.0000 mL | Freq: Once | INTRAMUSCULAR | Status: DC
  Filled 2020-02-21: qty 20

## 2020-02-21 NOTE — H&P (Signed)
Chief Complaint:  Symptomatic gallstones  History of Present Illness:  Courtney Nielsen is an 64 y.o. female who presents with upper abdominal bloating and at least one severe attack of upper abdominal pain and gallstones seen on CT scan.  She was seen in the office on 2 occasions and lap chole was discussed with her.  She wants to proceed with cholecystectomy and is aware of the risks and benefits.    Past Medical History:  Diagnosis Date  . Allergic rhinitis   . Allergy   . Arthritis   . Cataract    right eye removed with lens implant   . Chest pain   . Colonic polyp   . COPD (chronic obstructive pulmonary disease) ([place])    patient denies at preop on ;   . Dysrhythmia    hx of every third beat is " off" per patient   . Family history of adverse reaction to anesthesia    mother slow to wake up   . Hypertension   . Pneumonia    hx of 2014   . Smoker   . Vitamin B12 deficiency     Past Surgical History:  Procedure Laterality Date  . CARDIAC CATHETERIZATION     cath negative per patient   . CATARACT EXTRACTION W/PHACO Right 06/11/2016   Procedure: CATARACT EXTRACTION PHACO AND INTRAOCULAR LENS PLACEMENT RIGHT EYE CDE=6.15;  Surgeon: Tonny Branch, MD;  [facility];  Service: Ophthalmology;  Laterality: Right;  right  . CATARACT EXTRACTION W/PHACO Left 05/11/2019   Procedure: CATARACT EXTRACTION PHACO AND INTRAOCULAR LENS PLACEMENT LEFT EYE CDE=5.67;  Surgeon: Baruch Goldmann, MD;  Location: [facility];  Service: Ophthalmology;  Laterality: Left;  left  . COLONOSCOPY    . JOINT REPLACEMENT     left hip - 08/2019   . KNEE ARTHROSCOPY Right 10/19/2015   Procedure: RIGHT KNEE SCOPE WITH MENISCAL DEBRIDEMENT,LATERAL SYNOVECTOMY;  Surgeon: Gaynelle Arabian, MD;  [facility];  Service: Orthopedics;  Laterality: Right;  . POLYPECTOMY    . TOTAL KNEE ARTHROPLASTY Right 12/03/2016   Procedure: RIGHT TOTAL KNEE ARTHROPLASTY;  Surgeon: Gaynelle Arabian, MD;  [facility];  Service:  Orthopedics;  Laterality: Right;    Current Facility-Administered Medications  Medication Dose Route Frequency Provider Last Rate Last Admin  . [START ON 02/22/2020] bupivacaine liposome (EXPAREL) 1.3 % injection 266 mg  20 mL Infiltration Once Johnathan Hausen, MD       Current Outpatient Medications  Medication Sig Dispense Refill  . ALPRAZolam (XANAX) 0.5 MG tablet TAKE 1 TABLET AT BEDTIME AS NEEDED FOR SLEEP (Patient taking differently: Take 0.5 mg by mouth at bedtime. ) 30 tablet 3  . aspirin EC 81 MG tablet Take 81 mg by mouth at bedtime.     . bumetanide (BUMEX) 1 MG tablet Take 1 tablet (1 mg total) by mouth daily as needed (Swelling). 90 tablet 1  . Cholecalciferol (VITAMIN D3) 50 MCG (2000 UT) TABS Take 2,000 Units by mouth at bedtime.    Marland Kitchen loratadine (CLARITIN) 10 MG tablet Take 10 mg by mouth at bedtime.    . Omega-3 Fatty Acids (FISH OIL) 500 MG CAPS Take 500 mg by mouth at bedtime.     . traMADol (ULTRAM) 50 MG tablet Take 50-100 mg by mouth every 6 (six) hours as needed for moderate pain.     . vitamin B-12 (CYANOCOBALAMIN) 1000 MCG tablet Take 1,000 mcg by mouth at bedtime.    Marland Kitchen acetaminophen (TYLENOL) 500 MG tablet Take  1,000 mg by mouth every 6 (six) hours as needed for moderate pain. (Patient not taking: Reported on 02/12/2020)    . irbesartan (AVAPRO) 300 MG tablet Take 1 tablet (300 mg total) by mouth at bedtime. 90 tablet 0  . Menthol, Topical Analgesic, (BLUE-EMU MAXIMUM STRENGTH EX) Apply 1 application topically daily.  (Patient not taking: Reported on 02/12/2020)     Meloxicam, Dilaudid [hydromorphone hcl], Levaquin [levofloxacin], Ceftin [cefuroxime axetil], Coreg [carvedilol], Flagyl [metronidazole], Furosemide, Prednisone, Sulfonamide derivatives, and Verapamil Family History  Problem Relation Age of Onset  . Arthritis Mother   . Heart disease Mother   . COPD Father   . Cancer Father        Throat and stomach  . Arthritis Father   . Lung cancer Father   .  Lymphoma Father   . Esophageal cancer Father   . Asthma Brother   . Emphysema Paternal Grandfather   . Hypertension Other   . Diabetes Maternal Aunt   . Colon cancer Neg Hx   . Colon polyps Neg Hx   . Rectal cancer Neg Hx   . Stomach cancer Neg Hx    Social History:   reports that she quit smoking about 8 years ago. Her smoking use included cigarettes. She has a 20.00 pack-year smoking history. She has never used smokeless tobacco. She reports that she does not drink alcohol and does not use drugs.   REVIEW OF SYSTEMS : Negative except for see problem list  Physical Exam:   There were no vitals taken for this visit. There is no height or weight on file to calculate BMI.  Gen:  WDWN WF NAD  Neurological: Alert and oriented to person, place, and time. Motor and sensory function is grossly intact  Head: Normocephalic and atraumatic.  Eyes: Conjunctivae are normal. Pupils are equal, round, and reactive to light. No scleral icterus.  Neck: Normal range of motion. Neck supple. No tracheal deviation or thyromegaly present.  Cardiovascular:  SR without murmurs or gallops.  No carotid bruits Breast:  Not examined Respiratory: Effort normal.  No respiratory distress. No chest wall tenderness. Breath sounds normal.  No wheezes, rales or rhonchi.  Abdomen:  Nontender;  Hx of diverticulitis GU:  Not examined Musculoskeletal: Normal range of motion. Extremities are nontender. No cyanosis, edema or clubbing noted-recent knee replacement. Lymphadenopathy: No cervical, preauricular, postauricular or axillary adenopathy is present Skin: Skin is warm and dry. No rash noted. No diaphoresis. No erythema. No pallor. Pscyh: Normal mood and affect. Behavior is normal. Judgment and thought content normal.   LABORATORY RESULTS: No results found for this or any previous visit (from the past 48 hour(s)).   RADIOLOGY RESULTS: No results found.  Problem List: Patient Active Problem List   Diagnosis  Date Noted  . Diastolic dysfunction 19/14/7829  . Diverticulosis 11/11/2019  . Anxiety 10/28/2019  . Gallstones 09/30/2019  . Dyslipidemia 04/15/2019  . DOE (dyspnea on exertion) 12/09/2018  . Tachycardia 08/29/2017  . Periscapular pain 08/29/2017  . Primary osteoarthritis of right knee 12/04/2016  . OA (osteoarthritis) of knee 12/03/2016  . LLQ abdominal pain 03/15/2016  . Lateral meniscal tear 10/19/2015  . Left hip pain 07/22/2013  . Occipital neuralgia 06/04/2013  . Leg pain 02/22/2012  . Gout 02/22/2012  . Weight gain 01/11/2012  . Bilateral leg edema 06/07/2011  . Pruritus 04/23/2011  . Well adult exam 04/23/2011  . Pneumonia, organism unspecified(486) 04/21/2011  . Cough 04/19/2011  . Paresthesia 09/26/2010  . NECK PAIN 08/22/2010  .  BACK PAIN, THORACIC REGION 08/22/2010  . Low back pain 08/22/2010  . DIZZINESS 08/22/2010  . Cerumen impaction 07/21/2010  . CRAMP OF LIMB 03/16/2010  . BRONCHITIS, ACUTE 07/22/2009  . COPD 07/22/2009  . MEDIASTINAL LYMPHADENOPATHY 08/16/2008  . Nonspecific (abnormal) findings on radiological and other examination of body structure 08/16/2008  . CT, CHEST, ABNORMAL 08/16/2008  . BRADYCARDIA 06/23/2008  . GERD 06/23/2008  . SYNCOPE 06/23/2008  . B12 deficiency 04/09/2008  . Pain in Soft Tissues of Limb 01/08/2008  . RASH AND OTHER NONSPECIFIC SKIN ERUPTION 01/08/2008  . SINUSITIS, ACUTE 10/31/2007  . HIP PAIN 10/31/2007  . Essential hypertension 04/21/2007  . MENOPAUSAL SYNDROME 04/21/2007  . ALLERGIC RHINITIS 04/19/2007  . COLONIC POLYPS, HX OF 04/19/2007    Assessment & Plan: Gallstones that have been symtomatic.  For lap chole.      Matt B. Hassell Done, MD, [facility] [phone] beeper [phone]  02/21/2020 3:28 PM

## 2020-02-22 ENCOUNTER — Ambulatory Visit (HOSPITAL_COMMUNITY): Payer: BC Managed Care – PPO | Admitting: Physician Assistant

## 2020-02-22 ENCOUNTER — Ambulatory Visit (HOSPITAL_COMMUNITY): Payer: BC Managed Care – PPO | Admitting: Anesthesiology

## 2020-02-22 ENCOUNTER — Ambulatory Visit (HOSPITAL_COMMUNITY)
Admission: RE | Admit: 2020-02-22 | Discharge: 2020-02-22 | Disposition: A | Discharge status: Home / Self Care | Payer: BC Managed Care – PPO | Attending: Surgery | Admitting: Surgery

## 2020-02-22 ENCOUNTER — Encounter (HOSPITAL_COMMUNITY): Payer: Self-pay | Admitting: Surgery

## 2020-02-22 ENCOUNTER — Encounter (HOSPITAL_COMMUNITY)
Admission: RE | Disposition: A | Discharge status: Home / Self Care | Payer: Self-pay | Source: Home / Self Care | Attending: Surgery

## 2020-02-22 ENCOUNTER — Ambulatory Visit (HOSPITAL_COMMUNITY): Payer: BC Managed Care – PPO

## 2020-02-22 DIAGNOSIS — Z8261 Family history of arthritis: Secondary | ICD-10-CM | POA: Insufficient documentation

## 2020-02-22 DIAGNOSIS — Z961 Presence of intraocular lens: Secondary | ICD-10-CM | POA: Diagnosis not present

## 2020-02-22 DIAGNOSIS — F419 Anxiety disorder, unspecified: Secondary | ICD-10-CM | POA: Diagnosis not present

## 2020-02-22 DIAGNOSIS — E538 Deficiency of other specified B group vitamins: Secondary | ICD-10-CM | POA: Diagnosis not present

## 2020-02-22 DIAGNOSIS — Z801 Family history of malignant neoplasm of trachea, bronchus and lung: Secondary | ICD-10-CM | POA: Insufficient documentation

## 2020-02-22 DIAGNOSIS — M1990 Unspecified osteoarthritis, unspecified site: Secondary | ICD-10-CM | POA: Diagnosis not present

## 2020-02-22 DIAGNOSIS — E669 Obesity, unspecified: Secondary | ICD-10-CM | POA: Diagnosis not present

## 2020-02-22 DIAGNOSIS — J449 Chronic obstructive pulmonary disease, unspecified: Secondary | ICD-10-CM | POA: Insufficient documentation

## 2020-02-22 DIAGNOSIS — Z87891 Personal history of nicotine dependence: Secondary | ICD-10-CM | POA: Diagnosis not present

## 2020-02-22 DIAGNOSIS — K219 Gastro-esophageal reflux disease without esophagitis: Secondary | ICD-10-CM | POA: Diagnosis not present

## 2020-02-22 DIAGNOSIS — Z9841 Cataract extraction status, right eye: Secondary | ICD-10-CM | POA: Insufficient documentation

## 2020-02-22 DIAGNOSIS — M109 Gout, unspecified: Secondary | ICD-10-CM | POA: Diagnosis not present

## 2020-02-22 DIAGNOSIS — Z9842 Cataract extraction status, left eye: Secondary | ICD-10-CM | POA: Insufficient documentation

## 2020-02-22 DIAGNOSIS — Z6833 Body mass index (BMI) 33.0-33.9, adult: Secondary | ICD-10-CM | POA: Diagnosis not present

## 2020-02-22 DIAGNOSIS — K8010 Calculus of gallbladder with chronic cholecystitis without obstruction: Secondary | ICD-10-CM | POA: Diagnosis not present

## 2020-02-22 DIAGNOSIS — E785 Hyperlipidemia, unspecified: Secondary | ICD-10-CM | POA: Insufficient documentation

## 2020-02-22 DIAGNOSIS — I10 Essential (primary) hypertension: Secondary | ICD-10-CM | POA: Insufficient documentation

## 2020-02-22 DIAGNOSIS — Z7982 Long term (current) use of aspirin: Secondary | ICD-10-CM | POA: Diagnosis not present

## 2020-02-22 DIAGNOSIS — Z8249 Family history of ischemic heart disease and other diseases of the circulatory system: Secondary | ICD-10-CM | POA: Diagnosis not present

## 2020-02-22 DIAGNOSIS — Z79899 Other long term (current) drug therapy: Secondary | ICD-10-CM | POA: Diagnosis not present

## 2020-02-22 DIAGNOSIS — Z96642 Presence of left artificial hip joint: Secondary | ICD-10-CM | POA: Diagnosis not present

## 2020-02-22 DIAGNOSIS — Z825 Family history of asthma and other chronic lower respiratory diseases: Secondary | ICD-10-CM | POA: Insufficient documentation

## 2020-02-22 DIAGNOSIS — Z807 Family history of other malignant neoplasms of lymphoid, hematopoietic and related tissues: Secondary | ICD-10-CM | POA: Insufficient documentation

## 2020-02-22 DIAGNOSIS — I499 Cardiac arrhythmia, unspecified: Secondary | ICD-10-CM | POA: Insufficient documentation

## 2020-02-22 DIAGNOSIS — Z7952 Long term (current) use of systemic steroids: Secondary | ICD-10-CM | POA: Insufficient documentation

## 2020-02-22 DIAGNOSIS — Z96651 Presence of right artificial knee joint: Secondary | ICD-10-CM | POA: Insufficient documentation

## 2020-02-22 DIAGNOSIS — Z791 Long term (current) use of non-steroidal anti-inflammatories (NSAID): Secondary | ICD-10-CM | POA: Insufficient documentation

## 2020-02-22 DIAGNOSIS — Z833 Family history of diabetes mellitus: Secondary | ICD-10-CM | POA: Insufficient documentation

## 2020-02-22 DIAGNOSIS — Z800 Family history of malignant neoplasm of digestive organs: Secondary | ICD-10-CM | POA: Insufficient documentation

## 2020-02-22 HISTORY — PX: CHOLECYSTECTOMY: SHX55

## 2020-02-22 SURGERY — LAPAROSCOPIC CHOLECYSTECTOMY WITH INTRAOPERATIVE CHOLANGIOGRAM
Anesthesia: General

## 2020-02-22 MED ORDER — MIDAZOLAM HCL 2 MG/2ML IJ SOLN
INTRAMUSCULAR | Status: AC
  Filled 2020-02-22: qty 2

## 2020-02-22 MED ORDER — AMISULPRIDE (ANTIEMETIC) 5 MG/2ML IV SOLN
INTRAVENOUS | Status: AC
  Filled 2020-02-22: qty 4

## 2020-02-22 MED ORDER — OXYCODONE HCL 5 MG PO TABS: 5.0000 mg | ORAL_TABLET | Freq: Once | ORAL | Status: DC | PRN

## 2020-02-22 MED ORDER — SCOPOLAMINE 1 MG/3DAYS TD PT72
1.0000 | MEDICATED_PATCH | TRANSDERMAL | Status: DC
  Administered 2020-02-22: 1.5 mg via TRANSDERMAL
  Filled 2020-02-22: qty 1

## 2020-02-22 MED ORDER — ACETAMINOPHEN 10 MG/ML IV SOLN: 1000.0000 mg | Freq: Once | INTRAVENOUS | Status: AC

## 2020-02-22 MED ORDER — LIDOCAINE 2% (20 MG/ML) 5 ML SYRINGE
INTRAMUSCULAR | Status: AC
  Filled 2020-02-22: qty 5

## 2020-02-22 MED ORDER — PROMETHAZINE HCL 25 MG/ML IJ SOLN: 6.2500 mg | INTRAMUSCULAR | Status: DC | PRN

## 2020-02-22 MED ORDER — ONDANSETRON HCL 4 MG/2ML IJ SOLN
INTRAMUSCULAR | Status: DC | PRN
  Administered 2020-02-22: 4 mg via INTRAVENOUS

## 2020-02-22 MED ORDER — BUPIVACAINE LIPOSOME 1.3 % IJ SUSP
INTRAMUSCULAR | Status: DC | PRN
  Administered 2020-02-22: 20 mL

## 2020-02-22 MED ORDER — OXYCODONE HCL 5 MG PO TABS: 5.0000 mg | ORAL_TABLET | Freq: Four times a day (QID) | ORAL | 0 refills | Status: DC | PRN

## 2020-02-22 MED ORDER — LIDOCAINE 2% (20 MG/ML) 5 ML SYRINGE
INTRAMUSCULAR | Status: DC | PRN
  Administered 2020-02-22: 50 mg via INTRAVENOUS

## 2020-02-22 MED ORDER — SODIUM CHLORIDE 0.9 % IR SOLN
Status: DC | PRN
  Administered 2020-02-22: 3000 mL

## 2020-02-22 MED ORDER — OXYCODONE HCL 5 MG/5ML PO SOLN: 5.0000 mg | Freq: Once | ORAL | Status: DC | PRN

## 2020-02-22 MED ORDER — LACTATED RINGERS IV SOLN: INTRAVENOUS | Status: DC

## 2020-02-22 MED ORDER — ROCURONIUM BROMIDE 10 MG/ML (PF) SYRINGE
PREFILLED_SYRINGE | INTRAVENOUS | Status: AC
  Filled 2020-02-22: qty 10

## 2020-02-22 MED ORDER — PROPOFOL 10 MG/ML IV BOLUS
INTRAVENOUS | Status: DC | PRN
  Administered 2020-02-22: 130 mg via INTRAVENOUS

## 2020-02-22 MED ORDER — ROCURONIUM BROMIDE 10 MG/ML (PF) SYRINGE
PREFILLED_SYRINGE | INTRAVENOUS | Status: DC | PRN
  Administered 2020-02-22: 100 mg via INTRAVENOUS

## 2020-02-22 MED ORDER — PROPOFOL 10 MG/ML IV BOLUS
INTRAVENOUS | Status: AC
  Filled 2020-02-22: qty 20

## 2020-02-22 MED ORDER — FENTANYL CITRATE (PF) 100 MCG/2ML IJ SOLN
INTRAMUSCULAR | Status: AC
  Administered 2020-02-22: 50 ug via INTRAVENOUS
  Filled 2020-02-22: qty 2

## 2020-02-22 MED ORDER — CHLORHEXIDINE GLUCONATE CLOTH 2 % EX PADS: 6.0000 | MEDICATED_PAD | Freq: Once | CUTANEOUS | Status: DC

## 2020-02-22 MED ORDER — SUGAMMADEX SODIUM 200 MG/2ML IV SOLN
INTRAVENOUS | Status: DC | PRN
  Administered 2020-02-22: 200 mg via INTRAVENOUS

## 2020-02-22 MED ORDER — ORAL CARE MOUTH RINSE: 15.0000 mL | Freq: Once | OROMUCOSAL | Status: AC

## 2020-02-22 MED ORDER — ACETAMINOPHEN 10 MG/ML IV SOLN
INTRAVENOUS | Status: AC
  Administered 2020-02-22: 1000 mg via INTRAVENOUS
  Filled 2020-02-22: qty 100

## 2020-02-22 MED ORDER — FENTANYL CITRATE (PF) 100 MCG/2ML IJ SOLN
25.0000 ug | INTRAMUSCULAR | Status: DC | PRN
  Administered 2020-02-22: 50 ug via INTRAVENOUS

## 2020-02-22 MED ORDER — FENTANYL CITRATE (PF) 250 MCG/5ML IJ SOLN
INTRAMUSCULAR | Status: DC | PRN
Start: 2020-02-22 — End: 2020-02-22
  Administered 2020-02-22: 50 ug via INTRAVENOUS
  Administered 2020-02-22: 100 ug via INTRAVENOUS
  Administered 2020-02-22 (×2): 50 ug via INTRAVENOUS

## 2020-02-22 MED ORDER — AMISULPRIDE (ANTIEMETIC) 5 MG/2ML IV SOLN
10.0000 mg | Freq: Once | INTRAVENOUS | Status: AC | PRN
  Administered 2020-02-22: 10 mg via INTRAVENOUS

## 2020-02-22 MED ORDER — IOPAMIDOL (ISOVUE-300) INJECTION 61%
INTRAVENOUS | Status: DC | PRN
  Administered 2020-02-22: 4 mL

## 2020-02-22 MED ORDER — MEPERIDINE HCL 50 MG/ML IJ SOLN: 6.2500 mg | INTRAMUSCULAR | Status: DC | PRN

## 2020-02-22 MED ORDER — CEFAZOLIN SODIUM-DEXTROSE 2-4 GM/100ML-% IV SOLN
2.0000 g | INTRAVENOUS | Status: AC
  Administered 2020-02-22: 2 g via INTRAVENOUS
  Filled 2020-02-22: qty 100

## 2020-02-22 MED ORDER — MIDAZOLAM HCL 5 MG/5ML IJ SOLN
INTRAMUSCULAR | Status: DC | PRN
  Administered 2020-02-22: 2 mg via INTRAVENOUS

## 2020-02-22 MED ORDER — LABETALOL HCL 5 MG/ML IV SOLN
INTRAVENOUS | Status: DC | PRN
  Administered 2020-02-22 (×4): 5 mg via INTRAVENOUS

## 2020-02-22 MED ORDER — FENTANYL CITRATE (PF) 250 MCG/5ML IJ SOLN
INTRAMUSCULAR | Status: AC
  Filled 2020-02-22: qty 5

## 2020-02-22 MED ORDER — GABAPENTIN 300 MG PO CAPS
300.0000 mg | ORAL_CAPSULE | ORAL | Status: AC
  Administered 2020-02-22: 300 mg via ORAL
  Filled 2020-02-22: qty 1

## 2020-02-22 MED ORDER — CHLORHEXIDINE GLUCONATE 0.12 % MT SOLN
15.0000 mL | Freq: Once | OROMUCOSAL | Status: AC
  Administered 2020-02-22: 15 mL via OROMUCOSAL

## 2020-02-22 MED ORDER — ONDANSETRON HCL 4 MG/2ML IJ SOLN
INTRAMUSCULAR | Status: AC
  Filled 2020-02-22: qty 2

## 2020-02-22 SURGICAL SUPPLY — 46 items
ADH SKN CLS APL DERMABOND .7 (GAUZE/BANDAGES/DRESSINGS) ×1
APL SKNCLS STERI-STRIP NONHPOA (GAUZE/BANDAGES/DRESSINGS)
APL SWBSTK 6 STRL LF DISP (MISCELLANEOUS) ×2
APPLICATOR COTTON TIP 6 STRL (MISCELLANEOUS) ×2 IMPLANT
APPLICATOR COTTON TIP 6IN STRL (MISCELLANEOUS) ×4
APPLIER CLIP 5 13 M/L LIGAMAX5 (MISCELLANEOUS) ×2
APPLIER CLIP ROT 10 11.4 M/L (STAPLE) ×2
APR CLP MED LRG 11.4X10 (STAPLE) ×1
APR CLP MED LRG 5 ANG JAW (MISCELLANEOUS) ×1
BAG SPEC RTRVL 10 TROC 200 (ENDOMECHANICALS) ×1
BENZOIN TINCTURE PRP APPL 2/3 (GAUZE/BANDAGES/DRESSINGS) IMPLANT
CABLE HIGH FREQUENCY MONO STRZ (ELECTRODE) IMPLANT
CATH REDDICK CHOLANGI 4FR 50CM (CATHETERS) ×2 IMPLANT
CLIP APPLIE 5 13 M/L LIGAMAX5 (MISCELLANEOUS) IMPLANT
CLIP APPLIE ROT 10 11.4 M/L (STAPLE) IMPLANT
COVER MAYO STAND STRL (DRAPES) ×2 IMPLANT
COVER SURGICAL LIGHT HANDLE (MISCELLANEOUS) ×2 IMPLANT
COVER WAND RF STERILE (DRAPES) IMPLANT
DECANTER SPIKE VIAL GLASS SM (MISCELLANEOUS) ×1 IMPLANT
DERMABOND ADVANCED (GAUZE/BANDAGES/DRESSINGS) ×1
DERMABOND ADVANCED .7 DNX12 (GAUZE/BANDAGES/DRESSINGS) ×1 IMPLANT
DRAPE C-ARM 42X120 X-RAY (DRAPES) ×2 IMPLANT
ELECT L-HOOK LAP 45CM DISP (ELECTROSURGICAL) ×4
ELECT PENCIL ROCKER SW 15FT (MISCELLANEOUS) ×2 IMPLANT
ELECT REM PT RETURN 15FT ADLT (MISCELLANEOUS) ×2 IMPLANT
ELECTRODE L-HOOK LAP 45CM DISP (ELECTROSURGICAL) IMPLANT
GLOVE BIOGEL M 8.0 STRL (GLOVE) ×2 IMPLANT
GOWN STRL REUS W/TWL XL LVL3 (GOWN DISPOSABLE) ×2 IMPLANT
HEMOSTAT SURGICEL 4X8 (HEMOSTASIS) IMPLANT
IV CATH 14GX2 1/4 (CATHETERS) ×2 IMPLANT
KIT BASIN OR (CUSTOM PROCEDURE TRAY) ×2 IMPLANT
KIT TURNOVER KIT A (KITS) IMPLANT
POUCH RETRIEVAL ECOSAC 10 (ENDOMECHANICALS) IMPLANT
POUCH RETRIEVAL ECOSAC 10MM (ENDOMECHANICALS) ×2
SCISSORS LAP 5X45 EPIX DISP (ENDOMECHANICALS) ×2 IMPLANT
SET IRRIG TUBING LAPAROSCOPIC (IRRIGATION / IRRIGATOR) ×2 IMPLANT
SET TUBE SMOKE EVAC HIGH FLOW (TUBING) ×2 IMPLANT
SLEEVE XCEL OPT CAN 5 100 (ENDOMECHANICALS) ×3 IMPLANT
STRIP CLOSURE SKIN 1/2X4 (GAUZE/BANDAGES/DRESSINGS) IMPLANT
SUT MNCRL AB 4-0 PS2 18 (SUTURE) ×4 IMPLANT
SYR 20ML LL LF (SYRINGE) ×2 IMPLANT
TOWEL OR 17X26 10 PK STRL BLUE (TOWEL DISPOSABLE) ×2 IMPLANT
TRAY LAPAROSCOPIC (CUSTOM PROCEDURE TRAY) ×2 IMPLANT
TROCAR BLADELESS OPT 5 100 (ENDOMECHANICALS) ×2 IMPLANT
TROCAR XCEL BLUNT TIP 100MML (ENDOMECHANICALS) IMPLANT
TROCAR XCEL NON-BLD 11X100MML (ENDOMECHANICALS) ×1 IMPLANT

## 2020-02-22 NOTE — Anesthesia Procedure Notes (Signed)
Procedure Name: Intubation Date/Time: 02/22/2020 2:37 PM Performed by: Anne Fu, CRNA Pre-anesthesia Checklist: Patient identified, Emergency Drugs available, Suction available, Patient being monitored and Timeout performed Patient Re-evaluated:Patient Re-evaluated prior to induction Oxygen Delivery Method: Circle system utilized Preoxygenation: Pre-oxygenation with 100% oxygen Induction Type: IV induction Ventilation: Mask ventilation without difficulty Laryngoscope Size: Mac and 4 Grade View: Grade I Tube type: Oral Tube size: 7.5 mm Number of attempts: 1 Airway Equipment and Method: Stylet Placement Confirmation: ETT inserted through vocal cords under direct vision,  positive ETCO2 and breath sounds checked- equal and bilateral Secured at: 20 cm Tube secured with: Tape Dental Injury: Teeth and Oropharynx as per pre-operative assessment

## 2020-02-22 NOTE — Transfer of Care (Signed)
Immediate Anesthesia Transfer of Care Note  Patient: Courtney Nielsen  Procedure(s) Performed: Procedure(s): LAPAROSCOPIC CHOLECYSTECTOMY WITH INTRAOPERATIVE CHOLANGIOGRAM (N/A)  Patient Location: PACU  Anesthesia Type:General  Level of Consciousness:  sedated, patient cooperative and responds to stimulation  Airway & Oxygen Therapy:Patient Spontanous Breathing and Patient connected to face mask oxgen  Post-op Assessment:  Report given to PACU RN and Post -op Vital signs reviewed and stable  Post vital signs:  Reviewed and stable  Last Vitals:  Vitals:   02/22/20 1254 02/22/20 1314  BP: (!) 182/96 (!) 168/91  Pulse: 76   Resp: 16   Temp: 75.1 C     Complications: No apparent anesthesia complications

## 2020-02-22 NOTE — Op Note (Signed)
[name]  Primary Care Physician:  Cassandria Anger, MD    02/22/2020  4:08 PM  Procedure: Laparoscopic Cholecystectomy with intraoperative cholangiogram  Surgeon: Catalina Antigua B. Hassell Done, MD, FACS Asst:  Alphonsa Overall, MD, FACS  Anes:  General  Drains:  None  Findings: Mild chronic cholecystitis with normal IOC  Description of Procedure: The patient was taken to OR 2 and given general anesthesia.  The patient was prepped with chlorohexidine prep and draped sterilely. A time out was performed including identifying the patient and discussing their procedure.  Access to the abdomen was achieved with a 5 mm Optiview through the right upper quadrant.  Port placement included three 5 mm trocars and one 11 in the upper midline.    The gallbladder was visualized and appeared opaque/blue.   The fundus of the gallbaldder was grasped and the gallbladder was elevated. Traction on the infundibulum allowed for successful demonstration of the critical view. Inflammatory changes were mild chronic.  The cystic duct was identified and clipped up on the gallbladder and an incision was made in the cystic duct and the Reddick catheter was inserted after milking the cystic duct of any debris. A dynamic cholangiogram was performed which demonstrated a prominent CBD with free flow into the duodenum without filling defects.    The cystic duct was then triple clipped and divided, the cystic artery was double clipped and divided and then the gallbladder was removed from the gallbladder bed. Removal of the gallbladder from the gallbladder bed was performed without entering it.  The gallbladder was then placed in a bag and brought out through one of the trocar sites. The gallbladder bed was inspected and no bleeding or bile leaks were seen.    Exparel was  injected with a block on the right and left and the incisons closed with 4-0 Monocryl and Dermabond on the skin.  Sponge and needle count were correct.    The  patient was taken to the recovery room in satisfactory condition.

## 2020-02-22 NOTE — Anesthesia Postprocedure Evaluation (Signed)
Anesthesia Post Note  Patient: Courtney Nielsen  Procedure(s) Performed: LAPAROSCOPIC CHOLECYSTECTOMY WITH INTRAOPERATIVE CHOLANGIOGRAM (N/A )     Patient location during evaluation: PACU Anesthesia Type: General Level of consciousness: awake and alert Pain management: pain level controlled Vital Signs Assessment: post-procedure vital signs reviewed and stable Respiratory status: spontaneous breathing, nonlabored ventilation and respiratory function stable Cardiovascular status: blood pressure returned to baseline and stable Postop Assessment: no apparent nausea or vomiting Anesthetic complications: no   No complications documented.  Last Vitals:  Vitals:   02/22/20 1645 02/22/20 1700  BP: 135/77 139/79  Pulse: 71 64  Resp: 14 13  Temp:  36.7 C  SpO2: 98% 94%    Last Pain:  Vitals:   02/22/20 1645  TempSrc:   PainSc: [place]

## 2020-02-22 NOTE — Discharge Instructions (Signed)

## 2020-02-22 NOTE — Anesthesia Preprocedure Evaluation (Signed)
Anesthesia Evaluation  Patient identified by MRN, date of birth, ID band Patient awake    Reviewed: Allergy & Precautions, NPO status , Patient's Chart, lab work & pertinent test results  Airway Mallampati: II  TM Distance: >3 FB Neck ROM: Full    Dental no notable dental hx.    Pulmonary COPD, former smoker,    Pulmonary exam normal breath sounds clear to auscultation       Cardiovascular hypertension, Pt. on medications negative cardio ROS Normal cardiovascular exam Rhythm:Regular Rate:Normal     Neuro/Psych Anxiety negative neurological ROS  negative psych ROS   GI/Hepatic Neg liver ROS, GERD  ,  Endo/Other  negative endocrine ROS  Renal/GU negative Renal ROS  negative genitourinary   Musculoskeletal  (+) Arthritis , Osteoarthritis,    Abdominal (+) + obese,   Peds negative pediatric ROS (+)  Hematology negative hematology ROS (+)   Anesthesia Other Findings   Reproductive/Obstetrics negative OB ROS                             Anesthesia Physical Anesthesia Plan  ASA: II  Anesthesia Plan: General   Post-op Pain Management:    Induction: Intravenous  PONV Risk Score and Plan: 3 and Ondansetron, Dexamethasone, Midazolam and Treatment may vary due to age or medical condition  Airway Management Planned: Oral ETT  Additional Equipment:   Intra-op Plan:   Post-operative Plan: Extubation in OR  Informed Consent: I have reviewed the patients History and Physical, chart, labs and discussed the procedure including the risks, benefits and alternatives for the proposed anesthesia with the patient or authorized representative who has indicated his/her understanding and acceptance.     Dental advisory given  Plan Discussed with: CRNA  Anesthesia Plan Comments:         Anesthesia Quick Evaluation

## 2020-02-22 NOTE — Interval H&P Note (Signed)
History and Physical Interval Note:  02/22/2020 1:42 PM  Courtney Nielsen  has presented today for surgery, with the diagnosis of CHOLECYSTITIS.  The various methods of treatment have been discussed with the patient and family. After consideration of risks, benefits and other options for treatment, the patient has consented to  Procedure(s): LAPAROSCOPIC CHOLECYSTECTOMY WITH INTRAOPERATIVE CHOLANGIOGRAM (N/A) as a surgical intervention.  The patient's history has been reviewed, patient examined, no change in status, stable for surgery.  I have reviewed the patient's chart and labs.  Questions were answered to the patient's satisfaction.     [name]

## 2020-02-23 ENCOUNTER — Encounter (HOSPITAL_COMMUNITY): Payer: Self-pay | Admitting: Surgery

## 2020-02-23 ENCOUNTER — Other Ambulatory Visit: Payer: Self-pay | Admitting: Internal Medicine

## 2020-02-24 LAB — SURGICAL PATHOLOGY

## 2020-03-14 ENCOUNTER — Ambulatory Visit: Payer: BC Managed Care – PPO | Admitting: Internal Medicine

## 2020-03-14 ENCOUNTER — Encounter: Payer: Self-pay | Admitting: Internal Medicine

## 2020-03-14 ENCOUNTER — Other Ambulatory Visit: Payer: Self-pay

## 2020-03-14 DIAGNOSIS — M545 Low back pain, unspecified: Secondary | ICD-10-CM

## 2020-03-14 DIAGNOSIS — E785 Hyperlipidemia, unspecified: Secondary | ICD-10-CM

## 2020-03-14 DIAGNOSIS — E538 Deficiency of other specified B group vitamins: Secondary | ICD-10-CM

## 2020-03-14 DIAGNOSIS — I10 Essential (primary) hypertension: Secondary | ICD-10-CM

## 2020-03-14 DIAGNOSIS — E739 Lactose intolerance, unspecified: Secondary | ICD-10-CM

## 2020-03-14 DIAGNOSIS — K8020 Calculus of gallbladder without cholecystitis without obstruction: Secondary | ICD-10-CM

## 2020-03-14 DIAGNOSIS — N951 Menopausal and female climacteric states: Secondary | ICD-10-CM | POA: Insufficient documentation

## 2020-03-14 MED ORDER — ALPRAZOLAM 0.5 MG PO TABS
0.5000 mg | ORAL_TABLET | Freq: Every evening | ORAL | 1 refills | Status: DC | PRN
Start: 2020-03-14 — End: 2020-09-28

## 2020-03-14 NOTE — Assessment & Plan Note (Signed)
Try Alfa 1 milk

## 2020-03-14 NOTE — Assessment & Plan Note (Signed)
On B12 

## 2020-03-14 NOTE — Assessment & Plan Note (Signed)
CT angio March 2019 - no CAD, PE

## 2020-03-14 NOTE — Assessment & Plan Note (Signed)
Dr Hassell Done S/p surgery 2021

## 2020-03-14 NOTE — Assessment & Plan Note (Signed)
°   Potential benefits of a long term opioids use as well as potential risks (i.e. addiction risk, apnea etc) and complications (i.e. Somnolence, constipation and others) were explained to the patient and were aknowledged.

## 2020-03-14 NOTE — Progress Notes (Signed)
Subjective:  Patient ID: Gregary Courtney Nielsen, female    DOB: January 15, 1956  Age: 64 y.o. MRN: [id number]  CC: No chief complaint on file.   HPI SADDIE SANDEEN presents for anxiety, B12 def, OA f/u S/p GB surgery  Outpatient Medications Prior to Visit  Medication Sig Dispense Refill  . acetaminophen (TYLENOL) 500 MG tablet Take 1,000 mg by mouth every 6 (six) hours as needed for moderate pain.     Marland Kitchen ALPRAZolam (XANAX) 0.5 MG tablet TAKE 1 TABLET AT BEDTIME AS NEEDED FOR SLEEP 30 tablet 0  . aspirin EC 81 MG tablet Take 81 mg by mouth at bedtime.     . bumetanide (BUMEX) 1 MG tablet Take 1 tablet (1 mg total) by mouth daily as needed (Swelling). 90 tablet 1  . Cholecalciferol (VITAMIN D3) 50 MCG (2000 UT) TABS Take 2,000 Units by mouth at bedtime.    . irbesartan (AVAPRO) 300 MG tablet Take 1 tablet (300 mg total) by mouth at bedtime. 90 tablet 0  . loratadine (CLARITIN) 10 MG tablet Take 10 mg by mouth at bedtime.    . Menthol, Topical Analgesic, (BLUE-EMU MAXIMUM STRENGTH EX) Apply 1 application topically daily.     . Omega-3 Fatty Acids (FISH OIL) 500 MG CAPS Take 500 mg by mouth at bedtime.     . traMADol (ULTRAM) 50 MG tablet Take 50-100 mg by mouth every 6 (six) hours as needed for moderate pain.     . vitamin B-12 (CYANOCOBALAMIN) 1000 MCG tablet Take 1,000 mcg by mouth at bedtime.    Marland Kitchen oxyCODONE (OXY IR/ROXICODONE) 5 MG immediate release tablet Take 1 tablet (5 mg total) by mouth every 6 (six) hours as needed for severe pain. 15 tablet 0   No facility-administered medications prior to visit.    ROS: Review of Systems  Constitutional: Negative for activity change, appetite change, chills, fatigue and unexpected weight change.  HENT: Negative for congestion, mouth sores and sinus pressure.   Eyes: Negative for visual disturbance.  Respiratory: Negative for cough and chest tightness.   Gastrointestinal: Negative for abdominal pain and nausea.  Genitourinary: Negative for  difficulty urinating, frequency and vaginal pain.  Musculoskeletal: Positive for arthralgias. Negative for back pain and gait problem.  Skin: Negative for pallor and rash.  Neurological: Negative for dizziness, tremors, weakness, numbness and headaches.  Psychiatric/Behavioral: Negative for confusion and sleep disturbance. The patient is not nervous/anxious.     Objective:  BP (!) 150/88 (BP Location: Right Arm, Patient Position: Sitting, Cuff Size: Large)   Pulse 79   Temp 98.2 F (36.8 C) (Oral)   Ht 5\' 10"  (1.778 m)   Wt 232 lb (105.2 kg)   SpO2 97%   BMI 33.29 kg/m   BP Readings from Last 3 Encounters:  03/14/20 (!) 150/88  02/22/20 138/70  01/07/20 130/73    Wt Readings from Last 3 Encounters:  03/14/20 232 lb (105.2 kg)  02/22/20 232 lb 6.4 oz (105.4 kg)  01/07/20 230 lb 6.4 oz (104.5 kg)    Physical Exam Constitutional:      General: She is not in acute distress.    Appearance: She is well-developed.  HENT:     Head: Normocephalic.     Right Ear: External ear normal.     Left Ear: External ear normal.     Nose: Nose normal.  Eyes:     General:        Right eye: No discharge.  Left eye: No discharge.     Conjunctiva/sclera: Conjunctivae normal.     Pupils: Pupils are equal, round, and reactive to light.  Neck:     Thyroid: No thyromegaly.     Vascular: No JVD.     Trachea: No tracheal deviation.  Cardiovascular:     Rate and Rhythm: Normal rate and regular rhythm.     Heart sounds: Normal heart sounds.  Pulmonary:     Effort: No respiratory distress.     Breath sounds: No stridor. No wheezing.  Abdominal:     General: Bowel sounds are normal. There is no distension.     Palpations: Abdomen is soft. There is no mass.     Tenderness: There is no abdominal tenderness. There is no guarding or rebound.  Musculoskeletal:        General: No tenderness.     Cervical back: Normal range of motion and neck supple.  Lymphadenopathy:     Cervical: No  cervical adenopathy.  Skin:    Findings: No erythema or rash.  Neurological:     Cranial Nerves: No cranial nerve deficit.     Motor: No abnormal muscle tone.     Coordination: Coordination normal.     Deep Tendon Reflexes: Reflexes normal.  Psychiatric:        Behavior: Behavior normal.        Thought Content: Thought content normal.        Judgment: Judgment normal.     Lab Results  Component Value Date   WBC 8.2 02/18/2020   HGB 14.2 02/18/2020   HCT 43.1 02/18/2020   PLT 256 02/18/2020   GLUCOSE 103 (H) 02/18/2020   CHOL 193 04/08/2019   TRIG 98.0 04/08/2019   HDL 45.90 04/08/2019   LDLDIRECT 164.6 01/05/2008   LDLCALC 128 (H) 04/08/2019   ALT 24 11/11/2019   AST 18 11/11/2019   NA 142 02/18/2020   K 4.4 02/18/2020   CL 106 02/18/2020   CREATININE 1.02 (H) 02/18/2020   BUN 13 02/18/2020   CO2 26 02/18/2020   TSH 1.02 04/08/2019   INR 0.9 08/03/2019   HGBA1C 5.6 03/02/2014    No results found.  Assessment & Plan:

## 2020-03-14 NOTE — Assessment & Plan Note (Addendum)
Worse Irbesartan and re-start Bumex

## 2020-03-14 NOTE — Patient Instructions (Addendum)
Please call [facility] at [phone].  Re-start Bumex daily  Try Alfa 1 milk

## 2020-03-16 ENCOUNTER — Encounter: Payer: Self-pay | Admitting: Cardiology

## 2020-05-04 DIAGNOSIS — M4316 Spondylolisthesis, lumbar region: Secondary | ICD-10-CM | POA: Diagnosis not present

## 2020-05-04 DIAGNOSIS — M47816 Spondylosis without myelopathy or radiculopathy, lumbar region: Secondary | ICD-10-CM | POA: Diagnosis not present

## 2020-05-19 ENCOUNTER — Other Ambulatory Visit: Payer: Self-pay | Admitting: Internal Medicine

## 2020-05-19 DIAGNOSIS — I10 Essential (primary) hypertension: Secondary | ICD-10-CM

## 2020-07-20 DIAGNOSIS — Z1231 Encounter for screening mammogram for malignant neoplasm of breast: Secondary | ICD-10-CM | POA: Diagnosis not present

## 2020-09-12 ENCOUNTER — Ambulatory Visit: Payer: BC Managed Care – PPO | Admitting: Internal Medicine

## 2020-09-21 ENCOUNTER — Ambulatory Visit: Payer: BC Managed Care – PPO | Admitting: Internal Medicine

## 2020-09-28 ENCOUNTER — Encounter: Payer: Self-pay | Admitting: Internal Medicine

## 2020-09-28 ENCOUNTER — Ambulatory Visit: Payer: BC Managed Care – PPO | Admitting: Internal Medicine

## 2020-09-28 ENCOUNTER — Other Ambulatory Visit: Payer: Self-pay

## 2020-09-28 DIAGNOSIS — E538 Deficiency of other specified B group vitamins: Secondary | ICD-10-CM

## 2020-09-28 DIAGNOSIS — F419 Anxiety disorder, unspecified: Secondary | ICD-10-CM

## 2020-09-28 DIAGNOSIS — R635 Abnormal weight gain: Secondary | ICD-10-CM | POA: Diagnosis not present

## 2020-09-28 DIAGNOSIS — R600 Localized edema: Secondary | ICD-10-CM | POA: Diagnosis not present

## 2020-09-28 DIAGNOSIS — M79672 Pain in left foot: Secondary | ICD-10-CM

## 2020-09-28 DIAGNOSIS — E785 Hyperlipidemia, unspecified: Secondary | ICD-10-CM | POA: Diagnosis not present

## 2020-09-28 DIAGNOSIS — I700 Atherosclerosis of aorta: Secondary | ICD-10-CM

## 2020-09-28 DIAGNOSIS — M79671 Pain in right foot: Secondary | ICD-10-CM

## 2020-09-28 DIAGNOSIS — M170 Bilateral primary osteoarthritis of knee: Secondary | ICD-10-CM | POA: Diagnosis not present

## 2020-09-28 LAB — URINALYSIS
Bilirubin Urine: NEGATIVE
Hgb urine dipstick: NEGATIVE
Ketones, ur: NEGATIVE
Leukocytes,Ua: NEGATIVE
Nitrite: NEGATIVE
Specific Gravity, Urine: 1.02 (ref 1.000–1.030)
Total Protein, Urine: NEGATIVE
Urine Glucose: NEGATIVE
Urobilinogen, UA: 0.2 (ref 0.0–1.0)
pH: 6 (ref 5.0–8.0)

## 2020-09-28 LAB — COMPREHENSIVE METABOLIC PANEL
ALT: 36 U/L — ABNORMAL HIGH (ref 0–35)
AST: 25 U/L (ref 0–37)
Albumin: 4 g/dL (ref 3.5–5.2)
Alkaline Phosphatase: 88 U/L (ref 39–117)
BUN: 11 mg/dL (ref 6–23)
CO2: 28 mEq/L (ref 19–32)
Calcium: 9.3 mg/dL (ref 8.4–10.5)
Chloride: 105 mEq/L (ref 96–112)
Creatinine, Ser: 1.03 mg/dL (ref 0.40–1.20)
GFR: 57.45 mL/min — ABNORMAL LOW (ref >60.00)
Glucose, Bld: 97 mg/dL (ref 70–99)
Potassium: 3.8 mEq/L (ref 3.5–5.1)
Sodium: 142 mEq/L (ref 135–145)
Total Bilirubin: 0.9 mg/dL (ref 0.2–1.2)
Total Protein: 6.8 g/dL (ref 6.0–8.3)

## 2020-09-28 LAB — CBC WITH DIFFERENTIAL/PLATELET
Basophils Absolute: 0.1 10*3/uL (ref 0.0–0.1)
Basophils Relative: 0.9 % (ref 0.0–3.0)
Eosinophils Absolute: 0.4 10*3/uL (ref 0.0–0.7)
Eosinophils Relative: 4.3 % (ref 0.0–5.0)
HCT: 40.8 % (ref 36.0–46.0)
Hemoglobin: 13.7 g/dL (ref 12.0–15.0)
Lymphocytes Relative: 31.8 % (ref 12.0–46.0)
Lymphs Abs: 3.1 10*3/uL (ref 0.7–4.0)
MCHC: 33.7 g/dL (ref 30.0–36.0)
MCV: 92.6 fl (ref 78.0–100.0)
Monocytes Absolute: 0.7 10*3/uL (ref 0.1–1.0)
Monocytes Relative: 6.8 % (ref 3.0–12.0)
Neutro Abs: 5.5 10*3/uL (ref 1.4–7.7)
Neutrophils Relative %: 56.2 % (ref 43.0–77.0)
Platelets: 261 10*3/uL (ref 150.0–400.0)
RBC: 4.4 Mil/uL (ref 3.87–5.11)
RDW: 13.6 % (ref 11.5–15.5)
WBC: 9.7 10*3/uL (ref 4.0–10.5)

## 2020-09-28 LAB — TSH: TSH: 1.94 u[IU]/mL (ref 0.35–4.50)

## 2020-09-28 LAB — LIPID PANEL
Cholesterol: 207 mg/dL — ABNORMAL HIGH (ref 0–200)
HDL: 45.8 mg/dL (ref >39.00)
LDL Cholesterol: 139 mg/dL — ABNORMAL HIGH (ref 0–99)
NonHDL: 161.35
Total CHOL/HDL Ratio: 5
Triglycerides: 112 mg/dL (ref 0.0–149.0)
VLDL: 22.4 mg/dL (ref 0.0–40.0)

## 2020-09-28 MED ORDER — ALPRAZOLAM 0.5 MG PO TABS: 0.5000 mg | ORAL_TABLET | Freq: Every evening | ORAL | 1 refills | Status: DC | PRN

## 2020-09-28 NOTE — Assessment & Plan Note (Signed)
We discussed diet Check TSH

## 2020-09-28 NOTE — Assessment & Plan Note (Addendum)
L knee hematoma Ortho tomorrow

## 2020-09-28 NOTE — Progress Notes (Signed)
Subjective:  Patient ID: Courtney Nielsen, female    DOB: 1956-02-23  Age: 65 y.o. MRN: [id number]  CC: Follow-up (6 months f/u)   HPI SIGNORA ZUCCO presents for HTN, OA, B12 def Pt fell on Sun - L knee w/hematoma C/o B feet pain  Outpatient Medications Prior to Visit  Medication Sig Dispense Refill  . acetaminophen (TYLENOL) 500 MG tablet Take 1,000 mg by mouth every 6 (six) hours as needed for moderate pain.     Marland Kitchen ALPRAZolam (XANAX) 0.5 MG tablet Take 1 tablet (0.5 mg total) by mouth at bedtime as needed. for sleep 90 tablet 1  . aspirin EC 81 MG tablet Take 81 mg by mouth at bedtime.     . bumetanide (BUMEX) 1 MG tablet Take 1 tablet (1 mg total) by mouth daily as needed (Swelling). 90 tablet 1  . Cholecalciferol (VITAMIN D3) 50 MCG (2000 UT) TABS Take 2,000 Units by mouth at bedtime.    . irbesartan (AVAPRO) 300 MG tablet TAKE 1 TABLET AT BEDTIME 90 tablet 3  . loratadine (CLARITIN) 10 MG tablet Take 10 mg by mouth at bedtime.    . Menthol, Topical Analgesic, (BLUE-EMU MAXIMUM STRENGTH EX) Apply 1 application topically daily.    . naproxen sodium (ALEVE) 220 MG tablet Take 220 mg by mouth daily as needed.    . Omega-3 Fatty Acids (FISH OIL) 500 MG CAPS Take 500 mg by mouth at bedtime.     . traMADol (ULTRAM) 50 MG tablet Take 50-100 mg by mouth every 6 (six) hours as needed for moderate pain.     . vitamin B-12 (CYANOCOBALAMIN) 1000 MCG tablet Take 1,000 mcg by mouth at bedtime.    Marland Kitchen oxyCODONE (OXY IR/ROXICODONE) 5 MG immediate release tablet Take 1 tablet (5 mg total) by mouth every 6 (six) hours as needed for severe pain. (Patient not taking: Reported on 09/28/2020) 15 tablet 0   No facility-administered medications prior to visit.    ROS: Review of Systems  Constitutional: Positive for fatigue and unexpected weight change. Negative for activity change, appetite change and chills.  HENT: Negative for congestion, mouth sores and sinus pressure.   Eyes: Negative for visual  disturbance.  Respiratory: Negative for cough and chest tightness.   Gastrointestinal: Negative for abdominal pain and nausea.  Genitourinary: Negative for difficulty urinating, frequency and vaginal pain.  Musculoskeletal: Positive for arthralgias, back pain and gait problem.  Skin: Positive for color change. Negative for pallor and rash.  Neurological: Negative for dizziness, tremors, weakness, numbness and headaches.  Psychiatric/Behavioral: Negative for confusion, sleep disturbance and suicidal ideas.    Objective:  BP (!) 142/88 (BP Location: Left Arm)   Pulse 75   Temp 97.9 F (36.6 C) (Oral)   Ht 5\' 10"  (1.778 m)   Wt 243 lb (110.2 kg)   LMP  (Approximate)   SpO2 96%   BMI 34.87 kg/m   BP Readings from Last 3 Encounters:  09/28/20 (!) 142/88  03/14/20 (!) 150/88  02/22/20 138/70    Wt Readings from Last 3 Encounters:  09/28/20 243 lb (110.2 kg)  03/14/20 232 lb (105.2 kg)  02/22/20 232 lb 6.4 oz (105.4 kg)    Physical Exam Constitutional:      General: She is not in acute distress.    Appearance: She is well-developed. She is obese.  HENT:     Head: Normocephalic.     Right Ear: External ear normal.     Left Ear: External ear  normal.     Nose: Nose normal.  Eyes:     General:        Right eye: No discharge.        Left eye: No discharge.     Conjunctiva/sclera: Conjunctivae normal.     Pupils: Pupils are equal, round, and reactive to light.  Neck:     Thyroid: No thyromegaly.     Vascular: No JVD.     Trachea: No tracheal deviation.  Cardiovascular:     Rate and Rhythm: Normal rate and regular rhythm.     Heart sounds: Normal heart sounds.  Pulmonary:     Effort: No respiratory distress.     Breath sounds: No stridor. No wheezing.  Abdominal:     General: Bowel sounds are normal. There is no distension.     Palpations: Abdomen is soft. There is no mass.     Tenderness: There is no abdominal tenderness. There is no guarding or rebound.   Musculoskeletal:        General: Tenderness present.     Cervical back: Normal range of motion and neck supple.  Lymphadenopathy:     Cervical: No cervical adenopathy.  Skin:    Findings: Bruising present. No erythema or rash.  Neurological:     Mental Status: She is oriented to person, place, and time.     Cranial Nerves: No cranial nerve deficit.     Motor: No abnormal muscle tone.     Coordination: Coordination normal.     Deep Tendon Reflexes: Reflexes normal.  Psychiatric:        Behavior: Behavior normal.        Thought Content: Thought content normal.        Judgment: Judgment normal.   L knee hematoma/pain L foot trace edema Feet NT  Lab Results  Component Value Date   WBC 8.2 02/18/2020   HGB 14.2 02/18/2020   HCT 43.1 02/18/2020   PLT 256 02/18/2020   GLUCOSE 103 (H) 02/18/2020   CHOL 193 04/08/2019   TRIG 98.0 04/08/2019   HDL 45.90 04/08/2019   LDLDIRECT 164.6 01/05/2008   LDLCALC 128 (H) 04/08/2019   ALT 24 11/11/2019   AST 18 11/11/2019   NA 142 02/18/2020   K 4.4 02/18/2020   CL 106 02/18/2020   CREATININE 1.02 (H) 02/18/2020   BUN 13 02/18/2020   CO2 26 02/18/2020   TSH 1.02 04/08/2019   INR 0.9 08/03/2019   HGBA1C 5.6 03/02/2014    No results found.  Assessment & Plan:    Walker Kehr, MD

## 2020-09-28 NOTE — Assessment & Plan Note (Signed)
Bumex prn

## 2020-09-28 NOTE — Assessment & Plan Note (Signed)
On B12 

## 2020-09-28 NOTE — Assessment & Plan Note (Signed)
On Fish oil  Lipids

## 2020-09-28 NOTE — Assessment & Plan Note (Signed)
Podiatry appt

## 2020-09-28 NOTE — Assessment & Plan Note (Signed)
Alprazolam prn  Potential benefits of a long term benzodiazepines  use as well as potential risks  and complications were explained to the patient and were aknowledged. 

## 2020-09-29 ENCOUNTER — Encounter: Payer: Self-pay | Admitting: Internal Medicine

## 2020-09-29 DIAGNOSIS — Z96642 Presence of left artificial hip joint: Secondary | ICD-10-CM | POA: Diagnosis not present

## 2020-09-29 DIAGNOSIS — M25572 Pain in left ankle and joints of left foot: Secondary | ICD-10-CM | POA: Diagnosis not present

## 2020-09-29 DIAGNOSIS — I700 Atherosclerosis of aorta: Secondary | ICD-10-CM | POA: Insufficient documentation

## 2020-09-29 DIAGNOSIS — M25562 Pain in left knee: Secondary | ICD-10-CM | POA: Diagnosis not present

## 2020-09-29 NOTE — Assessment & Plan Note (Addendum)
Declined statins On diet

## 2020-09-30 ENCOUNTER — Other Ambulatory Visit: Payer: Self-pay | Admitting: Internal Medicine

## 2020-09-30 DIAGNOSIS — E785 Hyperlipidemia, unspecified: Secondary | ICD-10-CM

## 2020-09-30 DIAGNOSIS — M79671 Pain in right foot: Secondary | ICD-10-CM | POA: Diagnosis not present

## 2020-09-30 DIAGNOSIS — M79672 Pain in left foot: Secondary | ICD-10-CM | POA: Diagnosis not present

## 2020-09-30 DIAGNOSIS — G609 Hereditary and idiopathic neuropathy, unspecified: Secondary | ICD-10-CM | POA: Diagnosis not present

## 2020-09-30 MED ORDER — ROSUVASTATIN CALCIUM 5 MG PO TABS: 5.0000 mg | ORAL_TABLET | Freq: Every day | ORAL | 11 refills | Status: DC

## 2020-10-21 DIAGNOSIS — M7062 Trochanteric bursitis, left hip: Secondary | ICD-10-CM | POA: Diagnosis not present

## 2020-12-30 DIAGNOSIS — G609 Hereditary and idiopathic neuropathy, unspecified: Secondary | ICD-10-CM | POA: Diagnosis not present

## 2021-01-18 ENCOUNTER — Other Ambulatory Visit: Payer: Self-pay

## 2021-01-18 ENCOUNTER — Encounter (HOSPITAL_BASED_OUTPATIENT_CLINIC_OR_DEPARTMENT_OTHER): Payer: Self-pay | Admitting: Cardiology

## 2021-01-18 ENCOUNTER — Ambulatory Visit (HOSPITAL_BASED_OUTPATIENT_CLINIC_OR_DEPARTMENT_OTHER): Payer: BC Managed Care – PPO | Admitting: Cardiology

## 2021-01-18 ENCOUNTER — Telehealth: Payer: Self-pay | Admitting: Pharmacist

## 2021-01-18 VITALS — BP 132/90 | HR 83 | Ht 70.0 in | Wt 246.4 lb

## 2021-01-18 DIAGNOSIS — Z7189 Other specified counseling: Secondary | ICD-10-CM

## 2021-01-18 DIAGNOSIS — Z6835 Body mass index (BMI) 35.0-35.9, adult: Secondary | ICD-10-CM

## 2021-01-18 DIAGNOSIS — I10 Essential (primary) hypertension: Secondary | ICD-10-CM | POA: Diagnosis not present

## 2021-01-18 NOTE — Progress Notes (Signed)
Cardiology Office Note:    Date:  01/18/2021   ID:  Courtney Nielsen, DOB [birth year], MRN [id number]  PCP:  Courtney Anger, MD  Cardiologist:  Courtney Dresser, MD PhD  Referring MD: Courtney Anger, MD   CC: follow up  History of Present Illness:    Courtney Nielsen is a 65 y.o. female with a hx of COPD, hypertension who is seen for follow up. I initially saw her 12/26/18 as a new consult at the request of Courtney Nielsen, Courtney Lacks, MD for the evaluation and management of shortness of breath, edema, and chest pain.  Cardiac history: Echocardiogram 12/24/18 showed EF 40-98%, grade 1 diastolic dysfunction. No significant valve disease. Labs showed Cr 1.03, nl TSH, largely nl CBC (borderline eosinophils), normal LFTs, normal D dimer, normal BNP of 12.  Today: Overall, she is feeling okay, and has no new complaints. She does not monitor her blood pressure at home.   Since March, her PCP prescribed rosuvastatin for her cholesterol management. She will follow-up with him in September. Recently, she was also dx with neuropathy and was started on B Complex-C and Vitamin C medications.   At night, she typically sleeps part of the night on the couch, and the rest of the night in bed. Her back pain does not allow her to sleep in one place the entire night.  For exercise, she is severely limited by her chronic back pain. At this time she is no longer receiving nerve block treatments as they began affecting other aspects of her health.   For her diet, her favorite foods include potatoes, breads, and pastas. She knows she needs to cut back on drinking Sprite. She wants to lose more weight, and is considering a kale diet. We discussed this and other weight loss options.   She denies any chest pain, shortness of breath, or palpitations. No headaches, lightheadedness, or syncope to report. Also has no lower extremity edema, orthopnea or PND.   Past Medical History:  Diagnosis Date    Allergic rhinitis    Allergy    Arthritis    Cataract    right eye removed with lens implant    Chest pain    Colonic polyp    COPD (chronic obstructive pulmonary disease) ([place])    patient denies at preop on ;    Dysrhythmia    hx of every third beat is " off" per patient    Family history of adverse reaction to anesthesia    mother slow to wake up    Hypertension    Pneumonia    hx of 2014    Smoker    Vitamin B12 deficiency     Past Surgical History:  Procedure Laterality Date   CARDIAC CATHETERIZATION     cath negative per patient    CATARACT EXTRACTION W/PHACO Right 06/11/2016   Procedure: CATARACT EXTRACTION PHACO AND INTRAOCULAR LENS PLACEMENT RIGHT EYE CDE=6.15;  Surgeon: Courtney Branch, MD;  Location: [facility];  Service: Ophthalmology;  Laterality: Right;  right   CATARACT EXTRACTION W/PHACO Left 05/11/2019   Procedure: CATARACT EXTRACTION PHACO AND INTRAOCULAR LENS PLACEMENT LEFT EYE CDE=5.67;  Surgeon: Courtney Goldmann, MD;  Location: [facility];  Service: Ophthalmology;  Laterality: Left;  left   CHOLECYSTECTOMY N/A 02/22/2020   Procedure: LAPAROSCOPIC CHOLECYSTECTOMY WITH INTRAOPERATIVE CHOLANGIOGRAM;  Surgeon: Courtney Hausen, MD;  Location: [facility];  Service: General;  Laterality: N/A;   COLONOSCOPY     JOINT REPLACEMENT     left hip -  08/2019    KNEE ARTHROSCOPY Right 10/19/2015   Procedure: RIGHT KNEE SCOPE WITH MENISCAL DEBRIDEMENT,LATERAL SYNOVECTOMY;  Surgeon: Courtney Arabian, MD;  Location: [facility];  Service: Orthopedics;  Laterality: Right;   POLYPECTOMY     TOTAL KNEE ARTHROPLASTY Right 12/03/2016   Procedure: RIGHT TOTAL KNEE ARTHROPLASTY;  Surgeon: Courtney Arabian, MD;  Location: [facility];  Service: Orthopedics;  Laterality: Right;    Current Medications: Current Outpatient Medications on File Prior to Visit  Medication Sig   acetaminophen (TYLENOL) 500 MG tablet Take 1,000 mg by mouth every 6 (six) hours as needed for moderate pain.    ALPRAZolam (XANAX) 0.5 MG tablet Take  1 tablet (0.5 mg total) by mouth at bedtime as needed. for sleep   aspirin EC 81 MG tablet Take 81 mg by mouth at bedtime.    bumetanide (BUMEX) 1 MG tablet Take 1 tablet (1 mg total) by mouth daily as needed (Swelling).   Cholecalciferol (VITAMIN D3) 50 MCG (2000 UT) TABS Take 2,000 Units by mouth at bedtime.   irbesartan (AVAPRO) 300 MG tablet TAKE 1 TABLET AT BEDTIME   loratadine (CLARITIN) 10 MG tablet Take 10 mg by mouth at bedtime.   Menthol, Topical Analgesic, (BLUE-EMU MAXIMUM STRENGTH EX) Apply 1 application topically daily.   naproxen sodium (ALEVE) 220 MG tablet Take 220 mg by mouth daily as needed.   Omega-3 Fatty Acids (FISH OIL) 500 MG CAPS Take 500 mg by mouth at bedtime.    rosuvastatin (CRESTOR) 5 MG tablet Take 1 tablet (5 mg total) by mouth daily.   traMADol (ULTRAM) 50 MG tablet Take 50-100 mg by mouth every 6 (six) hours as needed for moderate pain.    vitamin B-12 (CYANOCOBALAMIN) 1000 MCG tablet Take 1,000 mcg by mouth at bedtime.   No current facility-administered medications on file prior to visit.     Allergies:   Meloxicam, Dilaudid [hydromorphone hcl], Levaquin [levofloxacin], Ceftin [cefuroxime axetil], Coreg [carvedilol], Flagyl [metronidazole], Furosemide, Prednisone, Sulfonamide derivatives, and Verapamil   Social History   Tobacco Use   Smoking status: Former    Packs/day: 1.00    Years: 20.00    Pack years: 20.00    Types: Cigarettes    Quit date: 07/03/2011    Years since quitting: 9.5   Smokeless tobacco: Never  Vaping Use   Vaping Use: Never used  Substance Use Topics   Alcohol use: No   Drug use: No    Family History: The patient's family history includes Arthritis in her father and mother; Asthma in her brother; COPD in her father; Cancer in her father; Diabetes in her maternal aunt; Emphysema in her paternal grandfather; Esophageal cancer in her father; Heart disease in her mother; Hypertension in an other family member; Lung cancer in her  father; Lymphoma in her father. There is no history of Colon cancer, Colon polyps, Rectal cancer, or Stomach cancer.  ROS:   Please see the history of present illness.   (+) Chronic back pain Additional pertinent ROS otherwise unremarkable.   EKGs/Labs/Other Studies Reviewed:    The following studies were reviewed today:  Echo 12/24/18  1. The left ventricle has normal systolic function, with an ejection fraction of 55-60%. The cavity size was normal. There is mildly increased left ventricular wall thickness. Left ventricular diastolic Doppler parameters are consistent with impaired  relaxation.  2. The right ventricle has normal systolic function. The cavity was normal.  3. The mitral valve is grossly normal.  4. The tricuspid valve is  grossly normal.  5. The aortic valve is tricuspid. No stenosis of the aortic valve.  6. Normal LV systolic function; mild diastolic dysfunction; mild LVH.  EKG:  EKG is personally reviewed.    01/18/2021:  NSR at 83 bpm 01/07/2020: NSR at 66 bpm  Recent Labs: 09/28/2020: ALT 36; BUN 11; Creatinine, Ser 1.03; Hemoglobin 13.7; Platelets 261.0; Potassium 3.8; Sodium 142; TSH 1.94  Recent Lipid Panel    Component Value Date/Time   CHOL 207 (H) 09/28/2020 0910   TRIG 112.0 09/28/2020 0910   HDL 45.80 09/28/2020 0910   CHOLHDL 5 09/28/2020 0910   VLDL 22.4 09/28/2020 0910   LDLCALC 139 (H) 09/28/2020 0910   LDLDIRECT 164.6 01/05/2008 0844    Physical Exam:    VS:  BP 132/90   Pulse 83   Ht 5\' 10"  (1.778 m)   Wt 246 lb 6.4 oz (111.8 kg)   BMI 35.35 kg/m     Wt Readings from Last 3 Encounters:  02/09/21 245 lb (111.1 kg)  01/18/21 246 lb 6.4 oz (111.8 kg)  09/28/20 243 lb (110.2 kg)    GEN: Well nourished, well developed in no acute distress HEENT: Normal, moist mucous membranes NECK: No JVD CARDIAC: regular rhythm, normal S1 and S2, no rubs or gallops. No murmur. VASCULAR: Radial and DP pulses 2+ bilaterally. No carotid bruits RESPIRATORY:   Clear to auscultation without rales, wheezing or rhonchi  ABDOMEN: Soft, non-tender, non-distended MUSCULOSKELETAL:  Ambulates independently SKIN: Warm and dry, no edema NEUROLOGIC:  Alert and oriented x 3. No focal neuro deficits noted. PSYCHIATRIC:  Normal affect    ASSESSMENT:    1. Essential hypertension   2. Class 2 severe obesity due to excess calories with serious comorbidity and body mass index (BMI) of 35.0 to 35.9 in adult [facility])   3. Cardiac risk counseling   4. Counseling on health promotion and disease prevention     PLAN:    Shortness of breath, bilateral LE edema, episode of chest tightness: resolved. -echo with normal LV EF, grade 1 DD. BNP normal, labs otherwise normal.  -continue bumex as needed -we have discussed both chronic venous insufficiency and diastolic heart failure. Continue compression stockings, elevation, fluid management, salt avoidance as part of heart failure education  Hypertension: goal <130/80. Above diastolic goal today -on irbesartan, continue -discussed home blood pressure monitoring. She will consider  Obesity: BMI 35 with CV risk factors -we discussed obesity in terms of CV risk. We discussed GLP1RA as an adjunct to lifetyle for weight loss. She is interested in this, will reach out to our pharmacy team to assist with this  Cardiac risk counseling and prevention recommendations: -recommend heart healthy/Mediterranean diet, with whole grains, fruits, vegetable, fish, lean meats, nuts, and olive oil. Limit salt. -recommend moderate walking, 3-5 times/week for 30-50 minutes each session. Aim for at least 150 minutes.week. Goal should be pace of 3 miles/hours, or walking 1.5 miles in 30 minutes -recommend avoidance of tobacco products. Avoid excess alcohol. -ASCVD risk score: The 10-year ASCVD risk score Mikey Bussing DC Brooke Bonito., et al., 2013) is: 9.1%   Values used to calculate the score:     Age: 80 years     Sex: Female     Is Non-Hispanic African  American: No     Diabetic: No     Tobacco smoker: No     Systolic Blood Pressure: 867 mmHg     Is BP treated: Yes     HDL Cholesterol: 45.8 mg/dL  Total Cholesterol: 207 mg/dL   -on aspirin 81 mg per personal preference, we have discussed guidelines -not on statin, with ASCVD risk as above and no ASCVD history or diabetes, would focus on lifestyle management first. Aim for LDL closer to 100 if possible.  Plan for follow up: 1 year or sooner PRN  Medication Adjustments/Labs and Tests Ordered: Current medicines are reviewed at length with the patient today.  Concerns regarding medicines are outlined above.  Orders Placed This Encounter  Procedures   EKG 12-Lead    No orders of the defined types were placed in this encounter.   Patient Instructions  Medication Instructions:  Your Physician recommend you continue on your current medication as directed.    *If you need a refill on your cardiac medications before your next appointment, please call your pharmacy*   Lab Work: None ordered today   Testing/Procedures: None ordered today   Follow-Up: At [facility], you and your health needs are our priority.  As part of our continuing mission to provide you with exceptional heart care, we have created designated Provider Care Teams.  These Care Teams include your primary Cardiologist (physician) and Advanced Practice Providers (APPs -  Physician Assistants and Nurse Practitioners) who all work together to provide you with the care you need, when you need it.  We recommend signing up for the patient portal called "MyChart".  Sign up information is provided on this After Visit Summary.  MyChart is used to connect with patients for Virtual Visits (Telemedicine).  Patients are able to view lab/test results, encounter notes, upcoming appointments, etc.  Non-urgent messages can be sent to your provider as well.   To learn more about what you can do with MyChart, go to  [email or web address].    Your next appointment:   1 year(s)  The format for your next appointment:   In Person  Provider:   Buford Dresser, MD     [facility] Stumpf,acting as a scribe for Courtney Dresser, MD.,have documented all relevant documentation on the behalf of Courtney Dresser, MD,as directed by  Courtney Dresser, MD while in the presence of Courtney Dresser, MD.  I, Courtney Dresser, MD, have reviewed all documentation for this visit. The documentation on 02/21/21 for the exam, diagnosis, procedures, and orders are all accurate and complete.   Signed, Courtney Dresser, MD PhD 01/18/2021 [place]

## 2021-01-18 NOTE — Patient Instructions (Signed)

## 2021-01-18 NOTE — Telephone Encounter (Addendum)
Received referral from Dr Harrell Gave for GLP1-RA therapy initiation for weight loss. Pt has Pharmacist, community - will submit prior authorization for Wegovy to assess insurance coverage.  Reviewed medication over the phone with pt who is interested. Denies personal or family hx of thyroid cancer. Is planning on continuing with her [employer] prescription plan once she turns 65 in a few months, will just have Medicare A and B it sounds like. She is aware I'll give her a call back once I hear back regarding insurance coverage.

## 2021-01-23 NOTE — Telephone Encounter (Signed)
Wegovy PA approved through 05/23/21. Scheduled pt for PharmD visit in clinic for [place] education on 8/11. Will plan to titrate her on Ozempic since starting doses of Wegovy are still not available, then will transition to [facility] at 1.'7mg'$  when her pharmacy can order the dose.

## 2021-02-08 NOTE — Progress Notes (Addendum)
Patient ID: Courtney Nielsen                 DOB: 07-Sep-1955                    MRN: [id number]     HPI: Courtney Nielsen is a 65 y.o. female patient referred to pharmacy clinic by Dr Harrell Gave to initiate weight loss therapy with GLP1-RA. PMH is significant for obesity, aortic atherosclerosis, COPD, HTN, SOB, edema, chest pain. Echo 12/2018 showed EF 55-60% with grade 1 diastolic dysfunction. Most recent BMI 35.  Pt presents today in good spirits. Has not tried medications in the past for weight loss. Has been frustrated that she hasn't been able to lose weight since she doesn't have a big appetite. Exercise is limited due to back pain and neuropathy in her feet. 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 Prior auth for [facility] already approved.  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  Diet:  -Breakfast: bagel thins, pecan twirls -Lunch: grilled chicken or sandwich -Dinner: chicken, steak once a week, potatoes; will eat out at restaurants -Snacks: not much,  -Drinks: 1 cup of coffee, water, a few Sprites  Exercise: Limited due to back pain  Family History: Mother with heart disease and arthritis, father with COPD and throat, stomach, and lung cancer   Social History: Former tobacco use for 20 years, quit in 2013, denies drug or alcohol use  Labs: Lab Results  Component Value Date   HGBA1C 5.6 03/02/2014    Wt Readings from Last 1 Encounters:  01/18/21 246 lb 6.4 oz (111.8 kg)    BP Readings from Last 1 Encounters:  01/18/21 132/90   Pulse Readings from Last 1 Encounters:  01/18/21 83       Component Value Date/Time   CHOL 207 (H) 09/28/2020 0910   TRIG 112.0 09/28/2020 0910   HDL 45.80 09/28/2020 0910   CHOLHDL 5 09/28/2020 0910   VLDL 22.4  09/28/2020 0910   LDLCALC 139 (H) 09/28/2020 0910   LDLDIRECT 164.6 01/05/2008 0844    Past Medical History:  Diagnosis Date   Allergic rhinitis    Allergy    Arthritis    Cataract    right eye removed with lens implant    Chest pain    Colonic polyp    COPD (chronic obstructive pulmonary disease) ([place])    patient denies at preop on ;    Dysrhythmia    hx of every third beat is " off" per patient    Family history of adverse reaction to anesthesia    mother slow to wake up    Hypertension    Pneumonia    hx of 2014    Smoker    Vitamin B12 deficiency     Current Outpatient Medications on File Prior to Visit  Medication Sig Dispense Refill   acetaminophen (TYLENOL) 500 MG tablet Take 1,000 mg by mouth every 6 (six) hours as needed for moderate pain.      ALPRAZolam (XANAX) 0.5 MG tablet Take 1 tablet (0.5 mg total) by mouth at bedtime as needed. for sleep 90 tablet 1   Ascorbic Acid (VITAMIN C) 1000 MG tablet Take 1 tablet by mouth daily.     aspirin EC  81 MG tablet Take 81 mg by mouth at bedtime.      B Complex-C (SUPER B COMPLEX/VITAMIN C PO) Take 1 tablet by mouth daily.     bumetanide (BUMEX) 1 MG tablet Take 1 tablet (1 mg total) by mouth daily as needed (Swelling). 90 tablet 1   Cholecalciferol (VITAMIN D3) 50 MCG (2000 UT) TABS Take 2,000 Units by mouth at bedtime.     irbesartan (AVAPRO) 300 MG tablet TAKE 1 TABLET AT BEDTIME 90 tablet 3   loratadine (CLARITIN) 10 MG tablet Take 10 mg by mouth at bedtime.     Menthol, Topical Analgesic, (BLUE-EMU MAXIMUM STRENGTH EX) Apply 1 application topically daily.     naproxen sodium (ALEVE) 220 MG tablet Take 220 mg by mouth daily as needed.     nystatin (MYCOSTATIN/NYSTOP) powder Apply topically 2 (two) times daily as needed.     Omega-3 Fatty Acids (FISH OIL) 500 MG CAPS Take 500 mg by mouth at bedtime.      rosuvastatin (CRESTOR) 5 MG tablet Take 1 tablet (5 mg total) by mouth daily. 30 tablet 11   vitamin B-12  (CYANOCOBALAMIN) 1000 MCG tablet Take 1,000 mcg by mouth at bedtime.     No current facility-administered medications on file prior to visit.    Allergies  Allergen Reactions   Meloxicam Swelling and Other (See Comments)    Leg swelling   Dilaudid [Hydromorphone Hcl]     Burning of the face   Levaquin [Levofloxacin]     Head itching   Ceftin [Cefuroxime Axetil] Itching and Other (See Comments)    pruritis   Coreg [Carvedilol] Nausea Only   Flagyl [Metronidazole] Itching    Itching   Furosemide Other (See Comments)    cramps   Prednisone Itching and Other (See Comments)    Patient states she has tolerated   Sulfonamide Derivatives Nausea And Vomiting   Verapamil Other (See Comments)    edema     Assessment/Plan:  1. Weight loss - Patient has not met goal of at least 5% of body weight loss with comprehensive lifestyle modifications alone in the past 3-6 months. Pharmacotherapy is appropriate to pursue as augmentation. Will start semaglutide 0.62m weekly - pt injected first dose in office today using sample. Confirmed patient not pregnant and no personal or family history of medullary thyroid carcinoma (MTC) or Multiple Endocrine Neoplasia syndrome type 2 (MEN 2).   [facility], however first 3 doses unavailable due to overwhelming demand for med. Starting on Ozempic samples then [facility] already [facility]; Ozempic requires PA and insurance will not cover without DM diagnosis. Baseline A1c puts pt in prediabetic range.  Advised patient on common side effects including nausea, diarrhea, dyspepsia, decreased appetite, and fatigue. Counseled patient on reducing meal size and how to titrate medication to minimize side effects. Counseled patient to call if intolerable side effects or if experiencing dehydration, abdominal pain, or dizziness. Patient will adhere to dietary modifications and will target at least 150 minutes of  moderate intensity exercise weekly. Encouraged pt to eliminate regular soda from diet.  Planned dose titration schedule as follows: -Month 1: Inject Ozempic 0.2758msubcutaneously once weekly -Month 2: Inject Ozempic 0.58m58mubcutaneously once weekly -Month 3: Inject Ozempic 1mg52mbcutaneously once weekly -Month 4: Inject Wegovy 1.7mg 21mcutaneously once weekly -Month 5: Inject Wegovy 2.4mg s20mutaneously once weekly  Follow up in 1 month via telephone for future dose titration; 3 months in office.  MeganJinny Blossom  Vivien Rota, PharmD, BCACP, [place] [address] [place], [place] [address] Phone: [phone]; Fax: [phone] 02/09/2021 9:20 AM

## 2021-02-09 ENCOUNTER — Ambulatory Visit (INDEPENDENT_AMBULATORY_CARE_PROVIDER_SITE_OTHER): Payer: BC Managed Care – PPO | Admitting: Pharmacist

## 2021-02-09 ENCOUNTER — Other Ambulatory Visit: Payer: Self-pay

## 2021-02-09 LAB — HEMOGLOBIN A1C
Est. average glucose Bld gHb Est-mCnc: 120 mg/dL
Hgb A1c MFr Bld: 5.8 % — ABNORMAL HIGH (ref 4.8–5.6)

## 2021-02-09 MED ORDER — OZEMPIC (0.25 OR 0.5 MG/DOSE) 2 MG/1.5ML ~~LOC~~ SOPN
0.5000 mg | PEN_INJECTOR | SUBCUTANEOUS | 0 refills | Status: DC
Start: 2021-02-09 — End: 2021-02-13

## 2021-02-09 NOTE — Patient Instructions (Addendum)
Ozempic/Wegovy Counseling Points This medication reduces your appetite and may make you feel fuller longer.  Stop eating when your body tells you that you are full. This will likely happen sooner than you are used to. Store your medication in the fridge until you are ready to use it. Inject your medication in the fatty tissue of your lower abdominal area (2 inches away from belly button) or upper outer thigh. Common side effects include: nausea, diarrhea/constipation, and heartburn, and are more likely to occur if you overeat.  Dosing schedule: -Month 1: Inject Ozempic 0.'25mg'$  subcutaneously once weekly -Month 2: Inject Ozempic 0.'5mg'$  subcutaneously once weekly -Month 3: Inject Ozempic '1mg'$  subcutaneously once weekly -Month 4: Inject Wegovy 1.'7mg'$  subcutaneously once weekly -Month 5: Inject Wegovy 2.'4mg'$  subcutaneously once weekly   Tips for living a healthier life  SUGAR  Sugar is a huge problem in the modern day diet. Sugar is a big contributor to heart disease, diabetes, high triglyceride levels, fatty liver disease and obesity. Sugar is hidden in almost all packaged foods/beverages. Added sugar is extra sugar that is added beyond what is naturally found and has no nutritional benefit for your body. The American Heart Association recommends limiting added sugars to no more than 25g for women and 36 grams for men per day. There are many names for sugar including maltose, sucrose (names ending in "ose"), high fructose corn syrup, molasses, cane sugar, corn sweetener, raw sugar, syrup, honey or fruit juice concentrate.   One of the best ways to limit your added sugars is to stop drinking sweetened beverages such as soda, sweet tea, and fruit juice. There is 65g of added sugars in one 20oz bottle of Coke! That is equal to 6 donuts.   Pay attention and read all nutrition facts labels. Below is an examples of a nutrition facts label. The #1 is showing you the total sugars where the # 2 is showing you  the added sugars. This one serving has almost the max amount of added sugars per day!     EXERCISE  Exercise is good. We've all heard that. In an ideal world, we would all have time and resources to get plenty of it. When you are active, your heart pumps more efficiently and you will feel better.  Multiple studies show that even walking regularly has benefits that include living a longer life. The American Heart Association recommends 150 minutes per week of exercise (30 minutes per day most days of the week). You can do this in any increment you wish. Nine or more 10-minute walks count. So does an hour-long exercise class. Break the time apart into what will work in your life. Some of the best things you can do include walking briskly, jogging, cycling or swimming laps. Not everyone is ready to "exercise." Sometimes we need to start with just getting active. Here are some easy ways to be more active throughout the day:  Take the stairs instead of the elevator  Go for a 10-15 minute walk during your lunch break (find a friend to make it more enjoyable)  When shopping, park at the back of the parking lot  If you take public transportation, get off one stop early and walk the extra distance  Pace around while making phone calls  Check with your doctor if you aren't sure what your limitations may be. Always remember to drink plenty of water when doing any type of exercise. Don't feel like a failure if you're not getting the 90-150 minutes  per week. If you started by being a couch potato, then just a 10-minute walk each day is a huge improvement. Start with little victories and work your way up.   HEALTHY EATING TIPS  When looking to improve your eating habits, whether to lose weight, lower blood pressure or just be healthier, it helps to know what a serving size is.   Grains 1 slice of bread,  bagel,  cup pasta or rice  Vegetables 1 cup fresh or raw vegetables,  cup cooked or canned Fruits 1  piece of medium sized fruit,  cup canned,   Meats/Proteins  cup dried       1 oz meat, 1 egg,  cup cooked beans, nuts or seeds  Dairy        Fats Individual yogurt container, 1 cup (8oz)    1 teaspoon margarine/butter or vegetable  milk or milk alternative, 1 slice of cheese          oil; 1 tablespoon mayonnaise or salad dressing                  Plan ahead: make a menu of the meals for a week then create a grocery list to go with that menu. Consider meals that easily stretch into a night of leftovers, such as stews or casseroles. Or consider making two of your favorite meal and put one in the freezer for another night. Try a night or two each week that is "meatless" or "no cook" such as salads. When you get home from the grocery store wash and prepare your vegetables and fruits. Then when you need them they are ready to go.   Tips for going to the grocery store:  [place] store or generic brands  Check the weekly ad from your store on-line or in their in-store flyer  Look at the unit price on the shelf tag to compare/contrast the costs of different items  Buy fruits/vegetables in season  Carrots, bananas and apples are low-cost, naturally healthy items  If meats or frozen vegetables are on sale, buy some extras and put in your freezer  Limit buying prepared or "ready to eat" items, even if they are pre-made salads or fruit snacks  Do not shop when you're hungry  Foods at eye level tend to be more expensive. Look on the high and low shelves for deals.  Consider shopping at the farmer's market for fresh foods in season.  Avoid the cookie and chip aisles (these are expensive, high in calories and low in nutritional value). Shop on the outside of the grocery store.  Healthy food preparations:  If you can't get lean hamburger, be sure to drain the fat when cooking  Steam, saut (in olive oil), grill or bake foods  Experiment with different seasonings to avoid adding salt to your foods. Kosher  salt, sea salt and Himalayan salt are all still salt and should be avoided. Try seasoning food with onion, garlic, thyme, rosemary, basil ect. Onion powder or garlic powder is ok. Avoid if it says salt (ie garlic salt).        Other resources: American Heart Association - InstantFinish.fi         Go to the Healthy Living tab to get more information American Diabetes Association - www.diabetes.org         You don't have to be diabetic - check out the Food and Fitness tab

## 2021-02-13 NOTE — Addendum Note (Signed)
Addended by: Imelda Dandridge E on: 02/13/2021 08:19 AM   Modules accepted: Orders

## 2021-03-08 ENCOUNTER — Telehealth: Payer: Self-pay | Admitting: Pharmacist

## 2021-03-08 MED ORDER — OZEMPIC (0.25 OR 0.5 MG/DOSE) 2 MG/1.5ML ~~LOC~~ SOPN: 0.5000 mg | PEN_INJECTOR | SUBCUTANEOUS | 0 refills | Status: DC

## 2021-03-08 NOTE — Telephone Encounter (Signed)
Called pt to follow up with Ozempic, she is tolerating the med well and has lost 9 lbs in the first month. Rx for free 1 month supply for 0.'5mg'$  dose sent to pharmacy along with copay card, confirmed pharmacy can fill this. Will call pt back in a month when she'll be due for dose titration up to '1mg'$ .

## 2021-03-21 ENCOUNTER — Other Ambulatory Visit: Payer: Self-pay

## 2021-03-22 ENCOUNTER — Ambulatory Visit (INDEPENDENT_AMBULATORY_CARE_PROVIDER_SITE_OTHER): Payer: BC Managed Care – PPO | Admitting: Internal Medicine

## 2021-03-22 ENCOUNTER — Encounter: Payer: Self-pay | Admitting: Internal Medicine

## 2021-03-22 VITALS — BP 110/72 | HR 84 | Temp 97.9°F | Ht 70.0 in | Wt 235.0 lb

## 2021-03-22 DIAGNOSIS — E785 Hyperlipidemia, unspecified: Secondary | ICD-10-CM | POA: Diagnosis not present

## 2021-03-22 DIAGNOSIS — E538 Deficiency of other specified B group vitamins: Secondary | ICD-10-CM | POA: Diagnosis not present

## 2021-03-22 DIAGNOSIS — Z0000 Encounter for general adult medical examination without abnormal findings: Secondary | ICD-10-CM

## 2021-03-22 DIAGNOSIS — R635 Abnormal weight gain: Secondary | ICD-10-CM

## 2021-03-22 DIAGNOSIS — R600 Localized edema: Secondary | ICD-10-CM

## 2021-03-22 DIAGNOSIS — Z23 Encounter for immunization: Secondary | ICD-10-CM | POA: Diagnosis not present

## 2021-03-22 NOTE — Assessment & Plan Note (Signed)

## 2021-03-22 NOTE — Assessment & Plan Note (Signed)
Resolved on Ozempic

## 2021-03-22 NOTE — Assessment & Plan Note (Signed)
On B12 

## 2021-03-22 NOTE — Addendum Note (Signed)
Addended by: Earnstine Regal on: 03/22/2021 10:11 AM   Modules accepted: Orders

## 2021-03-22 NOTE — Assessment & Plan Note (Signed)
Cont w/Lipids

## 2021-03-22 NOTE — Progress Notes (Signed)
Subjective:  [name], female    DOB: February 03, 1956  Age: 65 y.o. MRN: [id number]  CC: Annual Exam   HPI SEATTLE DALPORTO presents for a well exam  Outpatient Medications Prior to Visit  Medication Sig Dispense Refill   acetaminophen (TYLENOL) 500 MG tablet Take 1,000 mg by mouth every 6 (six) hours as needed for moderate pain.      ALPRAZolam (XANAX) 0.5 MG tablet Take 1 tablet (0.5 mg total) by mouth at bedtime as needed. for sleep 90 tablet 1   Ascorbic Acid (VITAMIN C) 1000 MG tablet Take 1 tablet by mouth daily.     aspirin EC 81 MG tablet Take 81 mg by mouth at bedtime.      B Complex-C (SUPER B COMPLEX/VITAMIN C PO) Take 1 tablet by mouth daily.     bumetanide (BUMEX) 1 MG tablet Take 1 tablet (1 mg total) by mouth daily as needed (Swelling). 90 tablet 1   Cholecalciferol (VITAMIN D3) 50 MCG (2000 UT) TABS Take 2,000 Units by mouth at bedtime.     irbesartan (AVAPRO) 300 MG tablet TAKE 1 TABLET AT BEDTIME 90 tablet 3   loratadine (CLARITIN) 10 MG tablet Take 10 mg by mouth at bedtime.     Menthol, Topical Analgesic, (BLUE-EMU MAXIMUM STRENGTH EX) Apply 1 application topically daily.     naproxen sodium (ALEVE) 220 MG tablet Take 220 mg by mouth daily as needed.     nystatin (MYCOSTATIN/NYSTOP) powder Apply topically 2 (two) times daily as needed.     Omega-3 Fatty Acids (FISH OIL) 500 MG CAPS Take 500 mg by mouth at bedtime.      rosuvastatin (CRESTOR) 5 MG tablet Take 1 tablet (5 mg total) by mouth daily. 30 tablet 11   Semaglutide,0.25 or 0.5MG /DOS, (OZEMPIC, 0.25 OR 0.5 MG/DOSE,) 2 MG/1.5ML SOPN Inject 0.5 mg into the skin once a week. 1.5 mL 0   vitamin B-12 (CYANOCOBALAMIN) 1000 MCG tablet Take 1,000 mcg by mouth at bedtime.     No facility-administered medications prior to visit.    ROS: Review of Systems  Constitutional:  Negative for activity change, appetite change, chills, fatigue and unexpected weight change.  HENT:  Negative for congestion,  mouth sores and sinus pressure.   Eyes:  Negative for visual disturbance.  Respiratory:  Negative for cough and chest tightness.   Gastrointestinal:  Negative for abdominal pain and nausea.  Genitourinary:  Negative for difficulty urinating, frequency and vaginal pain.  Musculoskeletal:  Positive for arthralgias, back pain and gait problem.  Skin:  Negative for pallor and rash.  Neurological:  Negative for dizziness, tremors, weakness, numbness and headaches.  Psychiatric/Behavioral:  Negative for confusion, sleep disturbance and suicidal ideas.    Objective:  BP 110/72 (BP Location: Left Arm)   Pulse 84   Temp 97.9 F (36.6 C) (Oral)   Ht 5\' 10"  (1.778 m)   Wt 235 lb (106.6 kg)   SpO2 97%   BMI 33.72 kg/m   BP Readings from Last 3 Encounters:  03/22/21 110/72  01/18/21 132/90  09/28/20 (!) 142/88    Wt Readings from Last 3 Encounters:  03/22/21 235 lb (106.6 kg)  02/09/21 245 lb (111.1 kg)  01/18/21 246 lb 6.4 oz (111.8 kg)    Physical Exam Constitutional:      General: She is not in acute distress.    Appearance: She is well-developed. She is obese.  HENT:     Head: Normocephalic.  Right Ear: External ear normal.     Left Ear: External ear normal.     Nose: Nose normal.  Eyes:     General:        Right eye: No discharge.        Left eye: No discharge.     Conjunctiva/sclera: Conjunctivae normal.     Pupils: Pupils are equal, round, and reactive to light.  Neck:     Thyroid: No thyromegaly.     Vascular: No JVD.     Trachea: No tracheal deviation.  Cardiovascular:     Rate and Rhythm: Normal rate and regular rhythm.     Heart sounds: Normal heart sounds.  Pulmonary:     Effort: No respiratory distress.     Breath sounds: No stridor. No wheezing.  Abdominal:     General: Bowel sounds are normal. There is no distension.     Palpations: Abdomen is soft. There is no mass.     Tenderness: There is no abdominal tenderness. There is no guarding or rebound.   Musculoskeletal:        General: No tenderness.     Cervical back: Normal range of motion and neck supple. No rigidity.  Lymphadenopathy:     Cervical: No cervical adenopathy.  Skin:    Findings: No erythema or rash.  Neurological:     Mental Status: She is oriented to person, place, and time.     Cranial Nerves: No cranial nerve deficit.     Motor: No abnormal muscle tone.     Coordination: Coordination normal.     Gait: Gait abnormal.     Deep Tendon Reflexes: Reflexes normal.  Psychiatric:        Behavior: Behavior normal.        Thought Content: Thought content normal.        Judgment: Judgment normal.    Lab Results  Component Value Date   WBC 9.7 09/28/2020   HGB 13.7 09/28/2020   HCT 40.8 09/28/2020   PLT 261.0 09/28/2020   GLUCOSE 97 09/28/2020   CHOL 207 (H) 09/28/2020   TRIG 112.0 09/28/2020   HDL 45.80 09/28/2020   LDLDIRECT 164.6 01/05/2008   LDLCALC 139 (H) 09/28/2020   ALT 36 (H) 09/28/2020   AST 25 09/28/2020   NA 142 09/28/2020   K 3.8 09/28/2020   CL 105 09/28/2020   CREATININE 1.03 09/28/2020   BUN 11 09/28/2020   CO2 28 09/28/2020   TSH 1.94 09/28/2020   INR 0.9 08/03/2019   HGBA1C 5.8 (H) 02/09/2021    No results found.  Assessment & Plan:     Walker Kehr, MD

## 2021-03-22 NOTE — Assessment & Plan Note (Signed)
Better on Ozempic

## 2021-04-07 NOTE — Telephone Encounter (Signed)
Called pt to follow up - she's lost 15 lbs so far, tolerating Ozempic well, appetite is down.  Has given 3 doses of the 0.5mg  so far, has 1 left. 1mg  Wegovy dose still on national backorder, can't fill at pharmacy since she doesn't have DM so insurance won't cover, 1mg  samples not available.  Pt is willing to pick up sample of 0.5mg  dose and give 2 injections per week x 2 weeks for shorter titration, then increase up to 1.7mg  [facility] which I confirmed her pharmacy can order. She works in [employer] on Wednesdays and will plan to pick up a sample next Wed.

## 2021-04-12 MED ORDER — WEGOVY 1.7 MG/0.75ML ~~LOC~~ SOAJ: 1.7000 mg | SUBCUTANEOUS | 0 refills | Status: DC

## 2021-04-12 NOTE — Addendum Note (Signed)
Addended by: Marcelle Overlie D on: 04/12/2021 08:42 AM   Modules accepted: Orders

## 2021-04-12 NOTE — Telephone Encounter (Signed)
Patient picked up sample of Ozempic today. States she has one more injection at home then will start the 2 injections of the 0.5mg  x 2 weeks. Advised that the next dose, Wegovy 1.7mg  will be at her pharmacy. I will go ahead and send in Rx.

## 2021-04-12 NOTE — Addendum Note (Signed)
Addended by: Cherissa Hook E on: 04/12/2021 09:47 AM   Modules accepted: Orders

## 2021-04-17 NOTE — Telephone Encounter (Signed)
Pt called stating Courtney Nielsen is $80/month. Provided her with # to call to activate copay card.

## 2021-04-24 ENCOUNTER — Other Ambulatory Visit: Payer: Self-pay | Admitting: Internal Medicine

## 2021-05-10 ENCOUNTER — Ambulatory Visit: Payer: BC Managed Care – PPO | Admitting: Pharmacist

## 2021-05-10 ENCOUNTER — Other Ambulatory Visit: Payer: Self-pay

## 2021-05-10 DIAGNOSIS — E66811 Obesity, class 1: Secondary | ICD-10-CM

## 2021-05-10 DIAGNOSIS — E669 Obesity, unspecified: Secondary | ICD-10-CM | POA: Diagnosis not present

## 2021-05-10 MED ORDER — SEMAGLUTIDE-WEIGHT MANAGEMENT 2.4 MG/0.75ML ~~LOC~~ SOAJ: 2.4000 mg | SUBCUTANEOUS | 11 refills | Status: DC

## 2021-05-10 NOTE — Patient Instructions (Addendum)
Continue Wegovy 1.7mg  weekly for 3 more injections then increase to 2.4mg  weekly.  Try to make sure you are eating plenty of vegetables of all different colors.  Call us at [phone] with any issues  GLP-1 Receptor Agonist Counseling Points This medication reduces your appetite and may make you feel fuller longer.  Stop eating when your body tells you that you are full. This will likely happen sooner than you are used to. Store your medication in the fridge until you are ready to use it. Inject your medication in the fatty tissue of your lower abdominal area (2 inches away from belly button) or upper outer thigh. Rotate injection sites. Each pen will last you about 1 month (the first month it will last a few weeks longer). Use a different needle with each weekly injection. Common side effects include: nausea, diarrhea/constipation, and heartburn, and are more likely to occur if you overeat.   Tips for living a healthier life     Building a Healthy and Balanced Diet Make most of your meal vegetables and fruits -  of your plate. Aim for color and variety, and remember that potatoes don't count as vegetables on the Healthy Eating Plate because of their negative impact on blood sugar.  Go for whole grains -  of your plate. Whole and intact grains--whole wheat, barley, wheat berries, quinoa, oats, brown rice, and foods made with them, such as whole wheat pasta--have a milder effect on blood sugar and insulin than white bread, white rice, and other refined grains.  Protein power -  of your plate. Fish, poultry, beans, and nuts are all healthy, versatile protein sources--they can be mixed into salads, and pair well with vegetables on a plate. Limit red meat, and avoid processed meats such as bacon and sausage.  Healthy plant oils - in moderation. Choose healthy vegetable oils like olive, canola, soy, corn, sunflower, peanut, and others, and avoid partially hydrogenated oils, which  contain unhealthy trans fats. Remember that low-fat does not mean "healthy."  Drink water, coffee, or tea. Skip sugary drinks, limit milk and dairy products to one to two servings per day, and limit juice to a small glass per day.  Stay active. The red figure running across the [place] is a reminder that staying active is also important in weight control.  The main message of the Healthy Eating Plate is to focus on diet quality:  The type of carbohydrate in the diet is more important than the amount of carbohydrate in the diet, because some sources of carbohydrate--like vegetables (other than potatoes), fruits, whole grains, and beans--are healthier than others. The Healthy Eating Plate also advises consumers to avoid sugary beverages, a major source of calories--usually with little nutritional value--in the American diet. The Healthy Eating Plate encourages consumers to use healthy oils, and it does not set a maximum on the percentage of calories people should get each day from healthy sources of fat. In this way, the Healthy Eating Plate recommends the opposite of the low-fat message promoted for decades by the USDA.  [employer]kDist[employer].no  SUGAR  Sugar is a huge problem in the modern day diet. Sugar is a big contributor to heart disease, diabetes, high triglyceride levels, fatty liver disease and obesity. Sugar is hidden in almost all packaged foods/beverages. Added sugar is extra sugar that is added beyond what is naturally found and has no nutritional benefit for your body. The American Heart Association recommends limiting added sugars to no more than 25g  for women and 36 grams for men per day. There are many names for sugar including maltose, sucrose (names ending in "ose"), high fructose corn syrup, molasses, cane sugar, corn sweetener, raw sugar, syrup, honey or fruit juice concentrate.   One of the best ways to limit  your added sugars is to stop drinking sweetened beverages such as soda, sweet tea, and fruit juice.  There is 65g of added sugars in one 20oz bottle of Coke! That is equal to 7.5 donuts.   Pay attention and read all nutrition facts labels. Below is an examples of a nutrition facts label. The #1 is showing you the total sugars where the # 2 is showing you the added sugars. This one serving has almost the max amount of added sugars per day!     20 oz Soda 65g Sugar = 7.5 Glazed Donuts  16oz Energy  Drink 54g Sugar = 6.5 Glazed Donuts  Large Sweet  Tea 38g Sugar = 4 Glazed Donuts  20oz Sports  Drink 34g Sugar = 3.5 Glazed Donuts  8oz Chocolate Milk 24g Sugar =2.5 Glazed Donuts  8oz Orange  Juice 21g Sugar = 2 Glazed Donuts  1 Juice Box 14g Sugar = 1.5 Glazed Donuts  16oz Water= NO SUGAR!!  EXERCISE  Exercise is good. We've all heard that. In an ideal world, we would all have time and resources to get plenty of it. When you are active, your heart pumps more efficiently and you will feel better.  Multiple studies show that even walking regularly has benefits that include living a longer life. The American Heart Association recommends 150 minutes per week of exercise (30 minutes per day most days of the week). You can do this in any increment you wish. Nine or more 10-minute walks count. So does an hour-long exercise class. Break the time apart into what will work in your life. Some of the best things you can do include walking briskly, jogging, cycling or swimming laps. Not everyone is ready to "exercise." Sometimes we need to start with just getting active. Here are some easy ways to be more active throughout the day:  Take the stairs instead of the elevator  Go for a 10-15 minute walk during your lunch break (find a friend to make it more enjoyable)  When shopping, park at the back of the parking lot  If you take public transportation, get off one stop early and walk the extra  distance  Pace around while making phone calls  Check with your doctor if you aren't sure what your limitations may be. Always remember to drink plenty of water when doing any type of exercise. Don't feel like a failure if you're not getting the 90-150 minutes per week. If you started by being a couch potato, then just a 10-minute walk each day is a huge improvement. Start with little victories and work your way up.   HEALTHY EATING TIPS  When looking to improve your eating habits, whether to lose weight, lower blood pressure or just be healthier, it helps to know what a serving size is.   Grains 1 slice of bread,  bagel,  cup pasta or rice  Vegetables 1 cup fresh or raw vegetables,  cup cooked or canned Fruits 1 piece of medium sized fruit,  cup canned,   Meats/Proteins  cup dried       1 oz meat, 1 egg,  cup cooked beans, nuts or seeds  Dairy  Fats Individual yogurt container, 1 cup (8oz)    1 teaspoon margarine/butter or vegetable  milk or milk alternative, 1 slice of cheese          oil; 1 tablespoon mayonnaise or salad dressing                  Plan ahead: make a menu of the meals for a week then create a grocery list to go with that menu. Consider meals that easily stretch into a night of leftovers, such as stews or casseroles. Or consider making two of your favorite meal and put one in the freezer for another night. Try a night or two each week that is "meatless" or "no cook" such as salads. When you get home from the grocery store wash and prepare your vegetables and fruits. Then when you need them they are ready to go.   Tips for going to the grocery store:  [place] store or generic brands  Check the weekly ad from your store on-line or in their in-store flyer  Look at the unit price on the shelf tag to compare/contrast the costs of different items  Buy fruits/vegetables in season  Carrots, bananas and apples are low-cost, naturally healthy items  If meats or frozen  vegetables are on sale, buy some extras and put in your freezer  Limit buying prepared or "ready to eat" items, even if they are pre-made salads or fruit snacks  Do not shop when you're hungry  Foods at eye level tend to be more expensive. Look on the high and low shelves for deals.  Consider shopping at the farmer's market for fresh foods in season.  Avoid the cookie and chip aisles (these are expensive, high in calories and low in nutritional value). Shop on the outside of the grocery store.  Healthy food preparations:  If you can't get lean hamburger, be sure to drain the fat when cooking  Steam, saut (in olive oil), grill or bake foods  Experiment with different seasonings to avoid adding salt to your foods. Kosher salt, sea salt and Himalayan salt are all still salt and should be avoided. Try seasoning food with onion, garlic, thyme, rosemary, basil ect. Onion powder or garlic powder is ok. Avoid if it says salt (ie garlic salt).

## 2021-05-10 NOTE — Progress Notes (Signed)
Patient ID: Courtney Nielsen                 DOB: 11/17/1955                    MRN: [id number]     HPI: Courtney Nielsen is a 65 y.o. female patient referred to pharmacy clinic by Dr Harrell Gave to initiate weight loss therapy with GLP1-RA. PMH is significant for obesity, aortic atherosclerosis, COPD, HTN, SOB, edema, chest pain. Echo 12/2018 showed EF 55-60% with grade 1 diastolic dysfunction. Previous BMI 35. Most recent BMI 32.8.  Patient presents today for her 3 month follow up. She has lost 16.4lb. BMI now 32.8. She has not missed any doses. Is rotating her injection sites. No serious adverse effects. She reports constipation when on ozempic, but since switching to [facility] has had no issues. Has given 1 shot of Wegovy 1.7. Next shot is due tomorrow. Was able to use copay card so cost was $25. Stopped drinking soft drinks. 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  Diet:  -Breakfast: dry cheerios or nothing -Lunch: cheerios if she didn't eat breakfast or frozen grilled chicken -Dinner: grilled chicken salad, roast beef sub from subway -Snacks: grapes, apples -Drinks: 1 cup of coffee, water, crystal lite  Exercise: Limited due to back pain  Family History: Mother with heart disease and arthritis, father with COPD and throat, stomach, and lung cancer   Social History: Former tobacco use for 20 years, quit in 2013, denies drug or alcohol use  Labs: Lab Results  Component Value Date   HGBA1C 5.8 (H) 02/09/2021    Wt Readings from Last 1 Encounters:  03/22/21 235 lb (106.6 kg)    BP Readings from Last 1 Encounters:  03/22/21 110/72   Pulse Readings from Last 1 Encounters:  03/22/21 84       Component Value Date/Time   CHOL 207 (H)  09/28/2020 0910   TRIG 112.0 09/28/2020 0910   HDL 45.80 09/28/2020 0910   CHOLHDL 5 09/28/2020 0910   VLDL 22.4 09/28/2020 0910   LDLCALC 139 (H) 09/28/2020 0910   LDLDIRECT 164.6 01/05/2008 0844    Past Medical History:  Diagnosis Date   Allergic rhinitis    Allergy    Arthritis    Cataract    right eye removed with lens implant    Chest pain    Colonic polyp    COPD (chronic obstructive pulmonary disease) ([place])    patient denies at preop on ;    Dysrhythmia    hx of every third beat is " off" per patient    Family history of adverse reaction to anesthesia    mother slow to wake up    Hypertension    Pneumonia    hx of 2014    Smoker    Vitamin B12 deficiency     Current Outpatient Medications on File Prior to Visit  Medication Sig Dispense Refill   acetaminophen (TYLENOL) 500 MG tablet Take 1,000 mg by mouth every 6 (six) hours as needed for moderate pain.      ALPRAZolam (XANAX) 0.5 MG tablet TAKE (1) TABLET BY MOUTH AT BEDTIME. 30 tablet 3   Ascorbic Acid (VITAMIN C) 1000 MG tablet Take 1 tablet  by mouth daily.     aspirin EC 81 MG tablet Take 81 mg by mouth at bedtime.      B Complex-C (SUPER B COMPLEX/VITAMIN C PO) Take 1 tablet by mouth daily.     bumetanide (BUMEX) 1 MG tablet Take 1 tablet (1 mg total) by mouth daily as needed (Swelling). 90 tablet 1   Cholecalciferol (VITAMIN D3) 50 MCG (2000 UT) TABS Take 2,000 Units by mouth at bedtime.     irbesartan (AVAPRO) 300 MG tablet TAKE 1 TABLET AT BEDTIME 90 tablet 3   loratadine (CLARITIN) 10 MG tablet Take 10 mg by mouth at bedtime.     Menthol, Topical Analgesic, (BLUE-EMU MAXIMUM STRENGTH EX) Apply 1 application topically daily.     naproxen sodium (ALEVE) 220 MG tablet Take 220 mg by mouth daily as needed.     nystatin (MYCOSTATIN/NYSTOP) powder Apply topically 2 (two) times daily as needed.     Omega-3 Fatty Acids (FISH OIL) 500 MG CAPS Take 500 mg by mouth at bedtime.      rosuvastatin (CRESTOR) 5 MG tablet  Take 1 tablet (5 mg total) by mouth daily. 30 tablet 11   Semaglutide-Weight Management (WEGOVY) 1.7 MG/0.75ML SOAJ Inject 1.7 mg into the skin once a week. 3 mL 0   vitamin B-12 (CYANOCOBALAMIN) 1000 MCG tablet Take 1,000 mcg by mouth at bedtime.     No current facility-administered medications on file prior to visit.    Allergies  Allergen Reactions   Sulfur Nausea Only   Meloxicam Swelling and Other (See Comments)    Leg swelling   Dilaudid [Hydromorphone Hcl]     Burning of the face   Levaquin [Levofloxacin]     Head itching   Sulfa Antibiotics     Other reaction(s): Unknown   Ceftin [Cefuroxime Axetil] Itching and Other (See Comments)    pruritis   Coreg [Carvedilol] Nausea Only   Flagyl [Metronidazole] Itching    Itching   Furosemide Other (See Comments)    cramps   Prednisone Itching and Other (See Comments)    Patient states she has tolerated   Sulfonamide Derivatives Nausea And Vomiting   Verapamil Other (See Comments)    edema     Assessment/Plan:  1. Weight loss - Patient has lost 6.9% of her body weight. Tolerating Wegovy 1.7mg  weekly well. Continue with Wegovy 1.7 for 3 more weeks then increase to 2.4mg  weekly. Patient encouraged to eat plenty of vegetables of all colors. Follow up in clinic in 3 months. Will draw lipids and A1C at that visit.   Thank you,  Ramond Dial, Pharm.D, BCPS, CPP [place]  [address], [place], [place] [address]  Phone: [phone]; Fax: [phone]  05/10/2021 7:58 AM

## 2021-05-24 ENCOUNTER — Encounter: Payer: Self-pay | Admitting: Internal Medicine

## 2021-06-07 ENCOUNTER — Other Ambulatory Visit: Payer: Self-pay | Admitting: Internal Medicine

## 2021-06-07 DIAGNOSIS — I10 Essential (primary) hypertension: Secondary | ICD-10-CM

## 2021-06-22 ENCOUNTER — Telehealth: Payer: Self-pay | Admitting: Pharmacist

## 2021-06-22 NOTE — Telephone Encounter (Signed)
Received request that new PA for [facility] is required. Called pt for updated weight - she weighed herself most recently on 12/18 and was down to 214.8lbs (baseline 246 before starting Wegovy, was 228 at last visit in early November when she was still on 1.7mg  of Wegovy). She's tolerating higher maintenance dose of 2.4mg  well and is very excited with the 30+ lbs she's lost. Will send in prior authorization.

## 2021-06-29 NOTE — Telephone Encounter (Signed)
Called and spoke w/pt and stated that the wegovy was approved :Message from plan: Effective from 06/22/2021 through 06/21/2022. Pt voiced understanding

## 2021-07-10 ENCOUNTER — Telehealth: Payer: Self-pay | Admitting: Internal Medicine

## 2021-07-10 NOTE — Telephone Encounter (Signed)
Take ibuprofen 400 mg 2-3 times a day for couple days with food.  Please see myself or another provider if not better.  Thanks

## 2021-07-10 NOTE — Telephone Encounter (Signed)
Connected to [facility] 1.6.2023.   Caller states she is having pain that goes down in to her jaw and neck but mostly behind her ear. No fever.   Advised to go to Emergency Department

## 2021-07-11 NOTE — Telephone Encounter (Signed)
Patient notified of:   Take ibuprofen 400 mg 2-3 times a day for couple days with food.  Please see myself or another provider if not better.  Thanks

## 2021-07-19 ENCOUNTER — Ambulatory Visit: Payer: BC Managed Care – PPO | Admitting: Internal Medicine

## 2021-07-19 ENCOUNTER — Other Ambulatory Visit: Payer: Self-pay

## 2021-07-19 ENCOUNTER — Encounter: Payer: Self-pay | Admitting: Internal Medicine

## 2021-07-19 DIAGNOSIS — E538 Deficiency of other specified B group vitamins: Secondary | ICD-10-CM | POA: Diagnosis not present

## 2021-07-19 DIAGNOSIS — M545 Low back pain, unspecified: Secondary | ICD-10-CM

## 2021-07-19 DIAGNOSIS — I10 Essential (primary) hypertension: Secondary | ICD-10-CM | POA: Diagnosis not present

## 2021-07-19 DIAGNOSIS — M542 Cervicalgia: Secondary | ICD-10-CM

## 2021-07-19 DIAGNOSIS — M5481 Occipital neuralgia: Secondary | ICD-10-CM

## 2021-07-19 DIAGNOSIS — E669 Obesity, unspecified: Secondary | ICD-10-CM

## 2021-07-19 NOTE — Assessment & Plan Note (Signed)
Better on [facility]

## 2021-07-19 NOTE — Assessment & Plan Note (Signed)
Chronic Aleve prn

## 2021-07-19 NOTE — Assessment & Plan Note (Signed)
Cont on Irbesartan and Bumex

## 2021-07-19 NOTE — Assessment & Plan Note (Signed)
S/p RF treatments

## 2021-07-19 NOTE — Assessment & Plan Note (Signed)
On B12 

## 2021-07-19 NOTE — Patient Instructions (Signed)
Use a rice sock heating pad TENS unit Spiky ball

## 2021-07-19 NOTE — Assessment & Plan Note (Signed)
Use a rice sock heating pad TENS unit Spiky ball

## 2021-07-19 NOTE — Progress Notes (Signed)
Subjective:  Patient ID: Courtney Nielsen, female    DOB: 1955/09/17  Age: 66 y.o. MRN: [id number]  CC: Office Visit (Nerve issues behind left ear that radiates down neck into jaw)   HPI Courtney Nielsen presents for neck pain - worse, LBP, obesity, dyslipidemia, B12 def. Loosing wt on Wegovy  Outpatient Medications Prior to Visit  Medication Sig Dispense Refill   acetaminophen (TYLENOL) 500 MG tablet Take 1,000 mg by mouth every 6 (six) hours as needed for moderate pain.      ALPRAZolam (XANAX) 0.5 MG tablet TAKE (1) TABLET BY MOUTH AT BEDTIME. 30 tablet 3   Ascorbic Acid (VITAMIN C) 1000 MG tablet Take 1 tablet by mouth daily.     aspirin EC 81 MG tablet Take 81 mg by mouth at bedtime.      B Complex-C (SUPER B COMPLEX/VITAMIN C PO) Take 1 tablet by mouth daily.     bumetanide (BUMEX) 1 MG tablet Take 1 tablet (1 mg total) by mouth daily as needed (Swelling). 90 tablet 1   Cholecalciferol (VITAMIN D3) 50 MCG (2000 UT) TABS Take 2,000 Units by mouth at bedtime.     irbesartan (AVAPRO) 300 MG tablet TAKE 1 TABLET AT BEDTIME 90 tablet 3   loratadine (CLARITIN) 10 MG tablet Take 10 mg by mouth at bedtime.     Menthol, Topical Analgesic, (BLUE-EMU MAXIMUM STRENGTH EX) Apply 1 application topically daily.     naproxen sodium (ALEVE) 220 MG tablet Take 220 mg by mouth daily as needed.     nystatin (MYCOSTATIN/NYSTOP) powder Apply topically 2 (two) times daily as needed.     Omega-3 Fatty Acids (FISH OIL) 500 MG CAPS Take 500 mg by mouth at bedtime.      rosuvastatin (CRESTOR) 5 MG tablet Take 1 tablet (5 mg total) by mouth daily. 30 tablet 11   [START ON 09/03/2021] Semaglutide-Weight Management 2.4 MG/0.75ML SOAJ Inject 2.4 mg into the skin once a week. 3 mL 11   vitamin B-12 (CYANOCOBALAMIN) 1000 MCG tablet Take 1,000 mcg by mouth at bedtime.     No facility-administered medications prior to visit.    ROS: Review of Systems  Constitutional:  Negative for activity  change, appetite change, chills, fatigue and unexpected weight change.  HENT:  Negative for congestion, mouth sores and sinus pressure.   Eyes:  Negative for visual disturbance.  Respiratory:  Negative for cough and chest tightness.   Gastrointestinal:  Negative for abdominal pain and nausea.  Genitourinary:  Negative for difficulty urinating, frequency and vaginal pain.  Musculoskeletal:  Positive for arthralgias, back pain, gait problem and neck pain.  Skin:  Negative for pallor and rash.  Neurological:  Negative for dizziness, tremors, weakness, numbness and headaches.  Psychiatric/Behavioral:  Negative for confusion and sleep disturbance.    Objective:  BP 138/80 (BP Location: Left Arm, Patient Position: Sitting, Cuff Size: Normal)    Pulse 71    Temp 98.3 F (36.8 C) (Oral)    Ht 5\' 10"  (1.778 m)    Wt 215 lb (97.5 kg)    SpO2 98%    BMI 30.85 kg/m   BP Readings from Last 3 Encounters:  07/19/21 138/80  05/10/21 132/82  03/22/21 110/72    Wt Readings from Last 3 Encounters:  07/19/21 215 lb (97.5 kg)  05/10/21 228 lb 9.6 oz (103.7 kg)  03/22/21 235 lb (106.6 kg)    Physical Exam  Lab Results  Component Value Date   WBC 9.7 09/28/2020  HGB 13.7 09/28/2020   HCT 40.8 09/28/2020   PLT 261.0 09/28/2020   GLUCOSE 97 09/28/2020   CHOL 207 (H) 09/28/2020   TRIG 112.0 09/28/2020   HDL 45.80 09/28/2020   LDLDIRECT 164.6 01/05/2008   LDLCALC 139 (H) 09/28/2020   ALT 36 (H) 09/28/2020   AST 25 09/28/2020   NA 142 09/28/2020   K 3.8 09/28/2020   CL 105 09/28/2020   CREATININE 1.03 09/28/2020   BUN 11 09/28/2020   CO2 28 09/28/2020   TSH 1.94 09/28/2020   INR 0.9 08/03/2019   HGBA1C 5.8 (H) 02/09/2021    DG C-Arm 1-60 Min  Result Date: 02/22/2020 CLINICAL DATA:  Status post laparoscopic cholecystectomy. EXAM: DG C-ARM 1-60 MIN FLUOROSCOPY TIME:  Radiation Exposure Index (if provided by the fluoroscopic device): 8.98 mGy. COMPARISON:  None. FINDINGS: Under  fluoroscopy, contrast was injected through cystic duct remnant status post cholecystectomy. No extravasation or leakage is noted. Normal filling of the intrahepatic and extrahepatic biliary ducts is noted. No filling defects are seen to suggest retained stones. Antegrade flow into the duodenum is noted. IMPRESSION: No definite evidence of retained stone is noted status post cholecystectomy. Electronically Signed   By: Marijo Conception M.D.   On: 02/22/2020 15:58    Assessment & Plan:   Problem List Items Addressed This Visit     B12 deficiency    On B12      Essential hypertension    Cont on Irbesartan and Bumex      Low back pain    Chronic Aleve prn      NECK PAIN    S/p RF treatments      Obesity, Class I, BMI 30-34.9    Better on Wegovy      Occipital neuralgia of left side    Use a rice sock heating pad TENS unit Spiky ball         No orders of the defined types were placed in this encounter.     Follow-up: Return in about 3 months (around 10/17/2021).  Walker Kehr, MD

## 2021-07-25 DIAGNOSIS — Z1231 Encounter for screening mammogram for malignant neoplasm of breast: Secondary | ICD-10-CM | POA: Diagnosis not present

## 2021-07-25 DIAGNOSIS — B372 Candidiasis of skin and nail: Secondary | ICD-10-CM | POA: Insufficient documentation

## 2021-07-25 DIAGNOSIS — Z6830 Body mass index (BMI) 30.0-30.9, adult: Secondary | ICD-10-CM | POA: Diagnosis not present

## 2021-07-25 DIAGNOSIS — Z124 Encounter for screening for malignant neoplasm of cervix: Secondary | ICD-10-CM | POA: Diagnosis not present

## 2021-07-25 DIAGNOSIS — Z01419 Encounter for gynecological examination (general) (routine) without abnormal findings: Secondary | ICD-10-CM | POA: Diagnosis not present

## 2021-07-27 ENCOUNTER — Other Ambulatory Visit: Payer: Self-pay | Admitting: Obstetrics and Gynecology

## 2021-07-27 DIAGNOSIS — R928 Other abnormal and inconclusive findings on diagnostic imaging of breast: Secondary | ICD-10-CM

## 2021-08-02 ENCOUNTER — Other Ambulatory Visit: Payer: Self-pay

## 2021-08-02 ENCOUNTER — Ambulatory Visit: Payer: BC Managed Care – PPO | Admitting: Pharmacist

## 2021-08-02 VITALS — BP 128/78 | Wt 213.0 lb

## 2021-08-02 DIAGNOSIS — E785 Hyperlipidemia, unspecified: Secondary | ICD-10-CM

## 2021-08-02 DIAGNOSIS — E669 Obesity, unspecified: Secondary | ICD-10-CM

## 2021-08-02 LAB — LIPID PANEL
Chol/HDL Ratio: 2.6 ratio (ref 0.0–4.4)
Cholesterol, Total: 123 mg/dL (ref 100–199)
HDL: 48 mg/dL (ref >39)
LDL Chol Calc (NIH): 56 mg/dL (ref 0–99)
Triglycerides: 102 mg/dL (ref 0–149)
VLDL Cholesterol Cal: 19 mg/dL (ref 5–40)

## 2021-08-02 LAB — HEMOGLOBIN A1C
Est. average glucose Bld gHb Est-mCnc: 111 mg/dL
Hgb A1c MFr Bld: 5.5 % (ref 4.8–5.6)

## 2021-08-02 NOTE — Patient Instructions (Signed)
Continue Wegovy 2.4mg  weekly  Start using your exercise machine  Call us at [phone] with any questions

## 2021-08-02 NOTE — Progress Notes (Signed)
Patient ID: Courtney Nielsen                 DOB: [birth year]                    MRN: [id number]     HPI: Courtney Nielsen is a 66 y.o. female patient referred to pharmacy clinic by Dr Harrell Gave to initiate weight loss therapy with GLP1-RA. PMH is significant for obesity, aortic atherosclerosis, COPD, HTN, SOB, edema, chest pain. Echo 12/2018 showed EF 55-60% with grade 1 diastolic dysfunction. Previous BMI 32.8. Most recent BMI 30.56.  Patient presents today for her 6 month follow up. She has lost 32lb since starting Wegovy. BMI now 30.56. She has not missed any doses. Is rotating her injection sites. No serious adverse effects. She is on Wegovy 2.4mg  weekly. Was able to use copay card, cost was $0 the last time she picked it up. Stopped drinking soft drinks. Has changed her eating habits and how much she eats. Eating more raw vegetables and fruit since last visit. No hunger. Getting tired of grilled chicken. Bough an elpitcal (one where she can stand or sit) for Christmas. Needs her husband to put together.  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  Diet:  -Breakfast: dry cheerios or nothing, bagel thin w/ cream cheese -Lunch: cheerios if she didn't eat breakfast or frozen grilled chicken, raw veggies or fruit -Dinner: grilled chicken salad, roast beef sub from [employer] -Snacks: grapes, apples -Drinks: 1 cup of coffee, water, crystal lite  Exercise: Limited due to back pain- bought a elliptical for christmas  Family History: Mother with heart disease and arthritis, father with COPD and throat, stomach, and lung cancer   Social History: Former tobacco use for 20 years, quit in 2013, denies drug or alcohol use  Labs: Lab Results  Component Value Date   HGBA1C 5.8 (H) 02/09/2021    Wt Readings from Last 1 Encounters:   07/19/21 215 lb (97.5 kg)    BP Readings from Last 1 Encounters:  07/19/21 138/80   Pulse Readings from Last 1 Encounters:  07/19/21 71       Component Value Date/Time   CHOL 207 (H) 09/28/2020 0910   TRIG 112.0 09/28/2020 0910   HDL 45.80 09/28/2020 0910   CHOLHDL 5 09/28/2020 0910   VLDL 22.4 09/28/2020 0910   LDLCALC 139 (H) 09/28/2020 0910   LDLDIRECT 164.6 01/05/2008 0844    Past Medical History:  Diagnosis Date   Allergic rhinitis    Allergy    Arthritis    Cataract    right eye removed with lens implant    Chest pain    Colonic polyp    COPD (chronic obstructive pulmonary disease) ([place])    patient denies at preop on ;    Dysrhythmia    hx of every third beat is " off" per patient    Family history of adverse reaction to anesthesia    mother slow to wake up    Hypertension    Pneumonia    hx of 2014    Smoker    Vitamin B12 deficiency     Current Outpatient Medications on File Prior to Visit  Medication Sig Dispense Refill   acetaminophen (TYLENOL) 500 MG tablet Take 1,000 mg by mouth every 6 (six) hours as needed for  moderate pain.      ALPRAZolam (XANAX) 0.5 MG tablet TAKE (1) TABLET BY MOUTH AT BEDTIME. 30 tablet 3   Ascorbic Acid (VITAMIN C) 1000 MG tablet Take 1 tablet by mouth daily.     aspirin EC 81 MG tablet Take 81 mg by mouth at bedtime.      B Complex-C (SUPER B COMPLEX/VITAMIN C PO) Take 1 tablet by mouth daily.     bumetanide (BUMEX) 1 MG tablet Take 1 tablet (1 mg total) by mouth daily as needed (Swelling). 90 tablet 1   Cholecalciferol (VITAMIN D3) 50 MCG (2000 UT) TABS Take 2,000 Units by mouth at bedtime.     irbesartan (AVAPRO) 300 MG tablet TAKE 1 TABLET AT BEDTIME 90 tablet 3   loratadine (CLARITIN) 10 MG tablet Take 10 mg by mouth at bedtime.     Menthol, Topical Analgesic, (BLUE-EMU MAXIMUM STRENGTH EX) Apply 1 application topically daily.     naproxen sodium (ALEVE) 220 MG tablet Take 220 mg by mouth daily as needed.     nystatin  (MYCOSTATIN/NYSTOP) powder Apply topically 2 (two) times daily as needed.     Omega-3 Fatty Acids (FISH OIL) 500 MG CAPS Take 500 mg by mouth at bedtime.      rosuvastatin (CRESTOR) 5 MG tablet Take 1 tablet (5 mg total) by mouth daily. 30 tablet 11   [START ON 09/03/2021] Semaglutide-Weight Management 2.4 MG/0.75ML SOAJ Inject 2.4 mg into the skin once a week. 3 mL 11   vitamin B-12 (CYANOCOBALAMIN) 1000 MCG tablet Take 1,000 mcg by mouth at bedtime.     No current facility-administered medications on file prior to visit.    Allergies  Allergen Reactions   Sulfur Nausea Only   Meloxicam Swelling and Other (See Comments)    Leg swelling   Dilaudid [Hydromorphone Hcl]     Burning of the face   Levaquin [Levofloxacin]     Head itching   Sulfa Antibiotics     Other reaction(s): Unknown   Ceftin [Cefuroxime Axetil] Itching and Other (See Comments)    pruritis   Coreg [Carvedilol] Nausea Only   Flagyl [Metronidazole] Itching    Itching   Furosemide Other (See Comments)    cramps   Prednisone Itching and Other (See Comments)    Patient states she has tolerated   Sulfonamide Derivatives Nausea And Vomiting   Verapamil Other (See Comments)    edema     Assessment/Plan:  1. Weight loss - Patient has lost 13% of her body weight. Tolerating Wegovy 2.4mg  weekly well. Continue with Wegovy 2.4 mg weekly. I encouraged her to start using her exercise machine. We talked about alternatives to grilled chicken to that she doesn't get tired of the same thing. Lipid and A1C drawn today. Follow up as needed.   Thank you,  Ramond Dial, Pharm.D, BCPS, CPP [facility]  [address], [place], [place] [address]  Phone: [phone]; Fax: [phone]  08/02/2021 7:42 AM

## 2021-08-21 ENCOUNTER — Ambulatory Visit
Admission: RE | Admit: 2021-08-21 | Discharge: 2021-08-21 | Disposition: A | Discharge status: Home / Self Care | Payer: BC Managed Care – PPO | Source: Ambulatory Visit | Attending: Obstetrics and Gynecology | Admitting: Obstetrics and Gynecology

## 2021-08-21 DIAGNOSIS — R928 Other abnormal and inconclusive findings on diagnostic imaging of breast: Secondary | ICD-10-CM

## 2021-08-21 DIAGNOSIS — R922 Inconclusive mammogram: Secondary | ICD-10-CM | POA: Diagnosis not present

## 2021-08-21 DIAGNOSIS — N6002 Solitary cyst of left breast: Secondary | ICD-10-CM | POA: Diagnosis not present

## 2021-09-19 ENCOUNTER — Ambulatory Visit: Payer: BC Managed Care – PPO | Admitting: Internal Medicine

## 2021-10-02 ENCOUNTER — Other Ambulatory Visit: Payer: Self-pay | Admitting: Internal Medicine

## 2021-10-17 ENCOUNTER — Ambulatory Visit: Payer: BC Managed Care – PPO | Admitting: Internal Medicine

## 2021-10-17 ENCOUNTER — Encounter: Payer: Self-pay | Admitting: Internal Medicine

## 2021-10-17 VITALS — BP 90/62 | HR 77 | Temp 97.8°F | Ht 70.0 in | Wt 203.4 lb

## 2021-10-17 DIAGNOSIS — M25562 Pain in left knee: Secondary | ICD-10-CM

## 2021-10-17 DIAGNOSIS — G8929 Other chronic pain: Secondary | ICD-10-CM

## 2021-10-17 DIAGNOSIS — R600 Localized edema: Secondary | ICD-10-CM | POA: Diagnosis not present

## 2021-10-17 DIAGNOSIS — M25561 Pain in right knee: Secondary | ICD-10-CM

## 2021-10-17 DIAGNOSIS — I10 Essential (primary) hypertension: Secondary | ICD-10-CM | POA: Diagnosis not present

## 2021-10-17 DIAGNOSIS — R635 Abnormal weight gain: Secondary | ICD-10-CM | POA: Diagnosis not present

## 2021-10-17 DIAGNOSIS — Z0000 Encounter for general adult medical examination without abnormal findings: Secondary | ICD-10-CM

## 2021-10-17 NOTE — Assessment & Plan Note (Addendum)
Cont on Irbesartan; Bumex stopped Reduce Irbesartan if low BP 

## 2021-10-17 NOTE — Assessment & Plan Note (Addendum)
R >L Blue-Emu cream was recommended to use 2-3 times a day 

## 2021-10-17 NOTE — Assessment & Plan Note (Signed)
On Wegovy per Dr Christopher - loosing wt 

## 2021-10-17 NOTE — Progress Notes (Signed)
? ?Subjective:  ?Patient ID: Courtney Nielsen, female    DOB: [birth year]  Age: 66 y.o. MRN: [id number] ? ?CC: No chief complaint on file. ? ? ?HPI ?Andrew Soria Nidiffer presents for HTN, edema, OA ?On Wegovy per Dr Harrell Gave - loosing wt ? ?Outpatient Medications Prior to Visit  ?Medication Sig Dispense Refill  ? acetaminophen (TYLENOL) 500 MG tablet Take 1,000 mg by mouth every 6 (six) hours as needed for moderate pain.     ? ALPRAZolam (XANAX) 0.5 MG tablet TAKE (1) TABLET BY MOUTH AT BEDTIME. 30 tablet 3  ? Ascorbic Acid (VITAMIN C) 1000 MG tablet Take 1 tablet by mouth daily.    ? aspirin EC 81 MG tablet Take 81 mg by mouth at bedtime.     ? B Complex-C (SUPER B COMPLEX/VITAMIN C PO) Take 1 tablet by mouth daily.    ? Cholecalciferol (VITAMIN D3) 50 MCG (2000 UT) TABS Take 2,000 Units by mouth at bedtime.    ? irbesartan (AVAPRO) 300 MG tablet TAKE 1 TABLET AT BEDTIME 90 tablet 3  ? loratadine (CLARITIN) 10 MG tablet Take 10 mg by mouth at bedtime.    ? Menthol, Topical Analgesic, (BLUE-EMU MAXIMUM STRENGTH EX) Apply 1 application topically daily.    ? naproxen sodium (ALEVE) 220 MG tablet Take 220 mg by mouth daily as needed.    ? Omega-3 Fatty Acids (FISH OIL) 500 MG CAPS Take 500 mg by mouth at bedtime.     ? rosuvastatin (CRESTOR) 5 MG tablet TAKE 1 TABLET BY MOUTH ONCE A DAY. 30 tablet 3  ? Semaglutide-Weight Management 2.4 MG/0.75ML SOAJ Inject 2.4 mg into the skin once a week. 3 mL 11  ? vitamin B-12 (CYANOCOBALAMIN) 1000 MCG tablet Take 1,000 mcg by mouth at bedtime.    ? bumetanide (BUMEX) 1 MG tablet Take 1 tablet (1 mg total) by mouth daily as needed (Swelling). (Patient not taking: Reported on 10/17/2021) 90 tablet 1  ? nystatin (MYCOSTATIN/NYSTOP) powder Apply topically 2 (two) times daily as needed.    ? ?No facility-administered medications prior to visit.  ? ? ?ROS: ?Review of Systems  ?Constitutional:  Negative for activity change, appetite change, chills, fatigue and unexpected weight change.   ?HENT:  Negative for congestion, mouth sores and sinus pressure.   ?Eyes:  Negative for visual disturbance.  ?Respiratory:  Negative for cough and chest tightness.   ?Gastrointestinal:  Negative for abdominal pain and nausea.  ?Genitourinary:  Negative for difficulty urinating, frequency and vaginal pain.  ?Musculoskeletal:  Negative for back pain and gait problem.  ?Skin:  Negative for pallor and rash.  ?Neurological:  Negative for dizziness, tremors, weakness, numbness and headaches.  ?Psychiatric/Behavioral:  Negative for confusion, sleep disturbance and suicidal ideas.   ? ?Objective:  ?BP 90/62 (BP Location: Left Arm, Patient Position: Sitting, Cuff Size: Large)   Pulse 77   Temp 97.8 ?F (36.6 ?C) (Oral)   Ht '5\' 10"'$  (1.778 m)   Wt 203 lb 6 oz (92.3 kg)   SpO2 98%   BMI 29.18 kg/m?  ? ?BP Readings from Last 3 Encounters:  ?10/17/21 90/62  ?08/02/21 128/78  ?07/19/21 138/80  ? ? ?Wt Readings from Last 3 Encounters:  ?10/17/21 203 lb 6 oz (92.3 kg)  ?08/02/21 213 lb (96.6 kg)  ?07/19/21 215 lb (97.5 kg)  ? ? ?Physical Exam ?Constitutional:   ?   General: Courtney Nielsen is not in acute distress. ?   Appearance: Courtney Nielsen is well-developed. Courtney Nielsen is obese.  ?HENT:  ?  Head: Normocephalic.  ?   Right Ear: External ear normal.  ?   Left Ear: External ear normal.  ?   Nose: Nose normal.  ?Eyes:  ?   General:     ?   Right eye: No discharge.     ?   Left eye: No discharge.  ?   Conjunctiva/sclera: Conjunctivae normal.  ?   Pupils: Pupils are equal, round, and reactive to light.  ?Neck:  ?   Thyroid: No thyromegaly.  ?   Vascular: No JVD.  ?   Trachea: No tracheal deviation.  ?Cardiovascular:  ?   Rate and Rhythm: Normal rate and regular rhythm.  ?   Heart sounds: Normal heart sounds.  ?Pulmonary:  ?   Effort: No respiratory distress.  ?   Breath sounds: No stridor. No wheezing.  ?Abdominal:  ?   General: Bowel sounds are normal. There is no distension.  ?   Palpations: Abdomen is soft. There is no mass.  ?   Tenderness: There is  no abdominal tenderness. There is no guarding or rebound.  ?Musculoskeletal:     ?   General: No tenderness.  ?   Cervical back: Normal range of motion and neck supple. No rigidity.  ?Lymphadenopathy:  ?   Cervical: No cervical adenopathy.  ?Skin: ?   Findings: No erythema or rash.  ?Neurological:  ?   Cranial Nerves: No cranial nerve deficit.  ?   Motor: No abnormal muscle tone.  ?   Coordination: Coordination normal.  ?   Deep Tendon Reflexes: Reflexes normal.  ?Psychiatric:     ?   Behavior: Behavior normal.     ?   Thought Content: Thought content normal.     ?   Judgment: Judgment normal.  ? ? ?Lab Results  ?Component Value Date  ? WBC 9.7 09/28/2020  ? HGB 13.7 09/28/2020  ? HCT 40.8 09/28/2020  ? PLT 261.0 09/28/2020  ? GLUCOSE 97 09/28/2020  ? CHOL 123 08/02/2021  ? TRIG 102 08/02/2021  ? HDL 48 08/02/2021  ? LDLDIRECT 164.6 01/05/2008  ? [place] 56 08/02/2021  ? ALT 36 (H) 09/28/2020  ? AST 25 09/28/2020  ? NA 142 09/28/2020  ? K 3.8 09/28/2020  ? CL 105 09/28/2020  ? CREATININE 1.03 09/28/2020  ? BUN 11 09/28/2020  ? CO2 28 09/28/2020  ? TSH 1.94 09/28/2020  ? INR 0.9 08/03/2019  ? HGBA1C 5.5 08/02/2021  ? ? ?US BREAST LTD UNI LEFT INC AXILLA ? ?Addendum Date: 08/23/2021   ?ADDENDUM REPORT: 08/23/2021 10:41 ADDENDUM: The 1st sentence of the findings section should read as follows: 3D tomographic and 2D degenerated spot-compression images of the left breast confirm a 4 mm rounded, circumscribed mass in the outer retroareolar left breast. Electronically Signed   By: Claudie Revering M.D.   On: 08/23/2021 10:41  ? ?Result Date: 08/23/2021 ?CLINICAL DATA:  Possible mass in the anterior outer left breast on a recent screening mammogram. EXAM: DIGITAL DIAGNOSTIC UNILATERAL LEFT MAMMOGRAM WITH TOMOSYNTHESIS AND CAD; ULTRASOUND LEFT BREAST LIMITED TECHNIQUE: Left digital diagnostic mammography and breast tomosynthesis was performed. The images were evaluated with computer-aided detection.; Targeted ultrasound  examination of the left breast was performed. COMPARISON:  Previous exam(s). ACR Breast Density Category b: There are scattered areas of fibroglandular density. FINDINGS: 3D tomographic and 2D generated spot compression images of the right breast confirm a 4 mm rounded, circumscribed mass in the outer retroareolar left breast. Targeted ultrasound is performed, showing a  4 mm cyst in the 3 o'clock position of the left breast, 2 cm from the nipple, corresponding to the mammographic mass. IMPRESSION: 4 mm benign left breast cyst.  No evidence malignancy. RECOMMENDATION: Bilateral screening mammogram in 11 months when due. I have discussed the findings and recommendations with the patient. If applicable, a reminder letter will be sent to the patient regarding the next appointment. BI-RADS CATEGORY  2: Benign. Electronically Signed: By: Claudie Revering M.D. On: 08/21/2021 15:28 ? ?MM DIAG BREAST TOMO UNI LEFT ? ?Addendum Date: 08/23/2021   ?ADDENDUM REPORT: 08/23/2021 10:41 ADDENDUM: The 1st sentence of the findings section should read as follows: 3D tomographic and 2D degenerated spot-compression images of the left breast confirm a 4 mm rounded, circumscribed mass in the outer retroareolar left breast. Electronically Signed   By: Claudie Revering M.D.   On: 08/23/2021 10:41  ? ?Result Date: 08/23/2021 ?CLINICAL DATA:  Possible mass in the anterior outer left breast on a recent screening mammogram. EXAM: DIGITAL DIAGNOSTIC UNILATERAL LEFT MAMMOGRAM WITH TOMOSYNTHESIS AND CAD; ULTRASOUND LEFT BREAST LIMITED TECHNIQUE: Left digital diagnostic mammography and breast tomosynthesis was performed. The images were evaluated with computer-aided detection.; Targeted ultrasound examination of the left breast was performed. COMPARISON:  Previous exam(s). ACR Breast Density Category b: There are scattered areas of fibroglandular density. FINDINGS: 3D tomographic and 2D generated spot compression images of the right breast confirm a 4 mm  rounded, circumscribed mass in the outer retroareolar left breast. Targeted ultrasound is performed, showing a 4 mm cyst in the 3 o'clock position of the left breast, 2 cm from the nipple, corresponding to the mammographic m

## 2021-10-17 NOTE — Assessment & Plan Note (Signed)
Resolved

## 2021-10-17 NOTE — Patient Instructions (Signed)
Blue-Emu cream --  use 2-3 times a day ?TENS ?

## 2021-11-02 ENCOUNTER — Other Ambulatory Visit: Payer: Self-pay | Admitting: Internal Medicine

## 2021-11-07 NOTE — Telephone Encounter (Signed)
Patient is requesting her xanax refill ?

## 2021-11-09 ENCOUNTER — Telehealth: Payer: Self-pay

## 2021-11-09 NOTE — Telephone Encounter (Signed)
Pt is calling to request a new rx for the out of date Rx for ALPRAZolam Duanne Moron) 0.5 MG tablet ? ?Pharmacy: ?[facility], [place] ? ?LOV 10/17/21 ?ROV 04/18/22 ?

## 2021-11-10 NOTE — Telephone Encounter (Signed)
It was done.  Thanks °

## 2021-12-14 ENCOUNTER — Encounter: Payer: Self-pay | Admitting: Cardiology

## 2022-01-12 ENCOUNTER — Other Ambulatory Visit: Payer: Self-pay | Admitting: Internal Medicine

## 2022-01-19 ENCOUNTER — Ambulatory Visit (HOSPITAL_BASED_OUTPATIENT_CLINIC_OR_DEPARTMENT_OTHER): Payer: BC Managed Care – PPO | Admitting: Cardiology

## 2022-01-25 ENCOUNTER — Encounter (HOSPITAL_BASED_OUTPATIENT_CLINIC_OR_DEPARTMENT_OTHER): Payer: Self-pay | Admitting: Cardiology

## 2022-01-25 ENCOUNTER — Ambulatory Visit (HOSPITAL_BASED_OUTPATIENT_CLINIC_OR_DEPARTMENT_OTHER): Payer: BC Managed Care – PPO | Admitting: Cardiology

## 2022-01-25 VITALS — BP 118/74 | HR 84 | Ht 70.0 in | Wt 207.6 lb

## 2022-01-25 DIAGNOSIS — Z7189 Other specified counseling: Secondary | ICD-10-CM | POA: Diagnosis not present

## 2022-01-25 DIAGNOSIS — I10 Essential (primary) hypertension: Secondary | ICD-10-CM

## 2022-01-25 DIAGNOSIS — Z6829 Body mass index (BMI) 29.0-29.9, adult: Secondary | ICD-10-CM

## 2022-01-25 DIAGNOSIS — E785 Hyperlipidemia, unspecified: Secondary | ICD-10-CM

## 2022-01-25 NOTE — Progress Notes (Signed)
Cardiology Office Note:    Date:  01/25/2022   ID:  KYLIN GENNA, DOB [birth year], MRN [id number]  PCP:  Cassandria Anger, MD  Cardiologist:  Buford Dresser, MD PhD  Referring MD: Cassandria Anger, MD   CC: follow up  History of Present Illness:    Courtney Nielsen is a 66 y.o. female with a hx of COPD, hypertension who is seen for follow up. I initially saw her 12/26/18 as a new consult at the request of Plotnikov, Evie Lacks, MD for the evaluation and management of shortness of breath, edema, and chest pain.  Cardiac history: Echocardiogram 12/24/18 showed EF 59-29%, grade 1 diastolic dysfunction. No significant valve disease. Labs showed Cr 1.03, nl TSH, largely nl CBC (borderline eosinophils), normal LFTs, normal D dimer, normal BNP of 12.  Today: Has lost 40 lbs on Wegovy, weight at home 202 lbs. Taking 2.4 mg dose and doing very well. We discussed long term strategies for semaglutide. She is now BMI 29. Has worked hard on eating plan, avoiding added sugars. Eats a lot of fresh fruit and vegetables. Working to increase exercise. Limited by chronic back pain.  Doesn't check BP at home, but office Bps have been improving. Breathing is normal, no longer has LE edema.  Denies chest pain, shortness of breath at rest or with normal exertion. No PND, orthopnea, LE edema or unexpected weight gain. No syncope or palpitations.    Past Medical History:  Diagnosis Date   Allergic rhinitis    Allergy    Arthritis    Cataract    right eye removed with lens implant    Chest pain    Colonic polyp    COPD (chronic obstructive pulmonary disease) ([place])    patient denies at preop on ;    Dysrhythmia    hx of every third beat is " off" per patient    Family history of adverse reaction to anesthesia    mother slow to wake up    Hypertension    Pneumonia    hx of 2014    Smoker    Vitamin B12 deficiency     Past Surgical History:  Procedure Laterality Date   CARDIAC  CATHETERIZATION     cath negative per patient    CATARACT EXTRACTION W/PHACO Right 06/11/2016   Procedure: CATARACT EXTRACTION PHACO AND INTRAOCULAR LENS PLACEMENT RIGHT EYE CDE=6.15;  Surgeon: Tonny Branch, MD;  Location: [facility];  Service: Ophthalmology;  Laterality: Right;  right   CATARACT EXTRACTION W/PHACO Left 05/11/2019   Procedure: CATARACT EXTRACTION PHACO AND INTRAOCULAR LENS PLACEMENT LEFT EYE CDE=5.67;  Surgeon: Baruch Goldmann, MD;  Location: [facility];  Service: Ophthalmology;  Laterality: Left;  left   CHOLECYSTECTOMY N/A 02/22/2020   Procedure: LAPAROSCOPIC CHOLECYSTECTOMY WITH INTRAOPERATIVE CHOLANGIOGRAM;  Surgeon: Johnathan Hausen, MD;  Location: [facility];  Service: General;  Laterality: N/A;   COLONOSCOPY     JOINT REPLACEMENT     left hip - 08/2019    KNEE ARTHROSCOPY Right 10/19/2015   Procedure: RIGHT KNEE SCOPE WITH MENISCAL DEBRIDEMENT,LATERAL SYNOVECTOMY;  Surgeon: Gaynelle Arabian, MD;  Location: [facility];  Service: Orthopedics;  Laterality: Right;   POLYPECTOMY     TOTAL KNEE ARTHROPLASTY Right 12/03/2016   Procedure: RIGHT TOTAL KNEE ARTHROPLASTY;  Surgeon: Gaynelle Arabian, MD;  Location: [facility];  Service: Orthopedics;  Laterality: Right;    Current Medications: Current Outpatient Medications on File Prior to Visit  Medication Sig   acetaminophen (TYLENOL) 500 MG tablet Take  1,000 mg by mouth every 6 (six) hours as needed for moderate pain.    ALPRAZolam (XANAX) 0.5 MG tablet TAKE (1) TABLET BY MOUTH AT BEDTIME.   Ascorbic Acid (VITAMIN C) 1000 MG tablet Take 1 tablet by mouth daily.   aspirin EC 81 MG tablet Take 81 mg by mouth at bedtime.    B Complex-C (SUPER B COMPLEX/VITAMIN C PO) Take 1 tablet by mouth daily.   Cholecalciferol (VITAMIN D3) 50 MCG (2000 UT) TABS Take 2,000 Units by mouth at bedtime.   irbesartan (AVAPRO) 300 MG tablet TAKE 1 TABLET AT BEDTIME   loratadine (CLARITIN) 10 MG tablet Take 10 mg by mouth at bedtime.   Menthol, Topical Analgesic, (BLUE-EMU MAXIMUM  STRENGTH EX) Apply 1 application topically daily.   naproxen sodium (ALEVE) 220 MG tablet Take 220 mg by mouth daily as needed.   Omega-3 Fatty Acids (FISH OIL) 500 MG CAPS Take 500 mg by mouth at bedtime.    rosuvastatin (CRESTOR) 5 MG tablet TAKE 1 TABLET BY MOUTH ONCE A DAY.   Semaglutide-Weight Management 2.4 MG/0.75ML SOAJ Inject 2.4 mg into the skin once a week.   vitamin B-12 (CYANOCOBALAMIN) 1000 MCG tablet Take 1,000 mcg by mouth at bedtime.   No current facility-administered medications on file prior to visit.     Allergies:   Sulfur, Meloxicam, Dilaudid [hydromorphone hcl], Levaquin [levofloxacin], Sulfa antibiotics, Ceftin [cefuroxime axetil], Coreg [carvedilol], Flagyl [metronidazole], Furosemide, Prednisone, Sulfonamide derivatives, and Verapamil   Social History   Tobacco Use   Smoking status: Former    Packs/day: 1.00    Years: 20.00    Total pack years: 20.00    Types: Cigarettes    Quit date: 07/03/2011    Years since quitting: 10.5   Smokeless tobacco: Never  Vaping Use   Vaping Use: Never used  Substance Use Topics   Alcohol use: No   Drug use: No    Family History: The patient's family history includes Arthritis in her father and mother; Asthma in her brother; COPD in her father; Cancer in her father; Diabetes in her maternal aunt; Emphysema in her paternal grandfather; Esophageal cancer in her father; Heart disease in her mother; Hypertension in an other family member; Lung cancer in her father; Lymphoma in her father. There is no history of Colon cancer, Colon polyps, Rectal cancer, or Stomach cancer.  ROS:   Please see the history of present illness.   Additional pertinent ROS otherwise unremarkable.   EKGs/Labs/Other Studies Reviewed:    The following studies were reviewed today:  Echo 12/24/18  1. The left ventricle has normal systolic function, with an ejection fraction of 55-60%. The cavity size was normal. There is mildly increased left ventricular  wall thickness. Left ventricular diastolic Doppler parameters are consistent with impaired  relaxation.  2. The right ventricle has normal systolic function. The cavity was normal.  3. The mitral valve is grossly normal.  4. The tricuspid valve is grossly normal.  5. The aortic valve is tricuspid. No stenosis of the aortic valve.  6. Normal LV systolic function; mild diastolic dysfunction; mild LVH.  EKG:  EKG is personally reviewed.    01/25/22: NSR at 84 bpm 01/18/2021:  NSR at 83 bpm 01/07/2020: NSR at 66 bpm  Recent Labs: No results found for requested labs within last 365 days.  Recent Lipid Panel    Component Value Date/Time   CHOL 123 08/02/2021 0905   TRIG 102 08/02/2021 0905   HDL 48 08/02/2021 0905  CHOLHDL 2.6 08/02/2021 0905   CHOLHDL 5 09/28/2020 0910   VLDL 22.4 09/28/2020 0910   LDLCALC 56 08/02/2021 0905   LDLDIRECT 164.6 01/05/2008 0844    Physical Exam:    VS:  BP 118/74 (BP Location: Left Arm, Patient Position: Sitting, Cuff Size: Large)   Pulse 84   Ht '5\' 10"'$  (1.778 m)   Wt 207 lb 9.6 oz (94.2 kg)   BMI 29.79 kg/m     Wt Readings from Last 3 Encounters:  01/25/22 207 lb 9.6 oz (94.2 kg)  10/17/21 203 lb 6 oz (92.3 kg)  08/02/21 213 lb (96.6 kg)    GEN: Well nourished, well developed in no acute distress HEENT: Normal, moist mucous membranes NECK: No JVD CARDIAC: regular rhythm, normal S1 and S2, no rubs or gallops. No murmur. VASCULAR: Radial and DP pulses 2+ bilaterally. No carotid bruits RESPIRATORY:  Clear to auscultation without rales, wheezing or rhonchi  ABDOMEN: Soft, non-tender, non-distended MUSCULOSKELETAL:  Ambulates independently SKIN: Warm and dry, no edema NEUROLOGIC:  Alert and oriented x 3. No focal neuro deficits noted. PSYCHIATRIC:  Normal affect    ASSESSMENT:    1. Dyslipidemia   2. Essential hypertension   3. Cardiac risk counseling   4. Counseling on health promotion and disease prevention   5. BMI 29.0-29.9,adult     PLAN:    Hypertension: goal <130/80.  -excellent today -on irbesartan, continue. Can consider cutting back in the future if BP continues to drop  Obesity: BMI 35 with CV risk factors-->BMI 29 today -doing very well on Wegovy 2.4 mg weekly. -height is 5 ft 10.5. Goal weight 180 lbs per patient. A weight of 175 would put her BMI under 25. -shortness of breath, LE edema totally resolved with weight loss -continue current dose of Wegovy. As she approaches her goal way, would start to taper down the dose  Hypercholesterolemia -continue rosuvastatin -on aspirin per personal preference  Cardiac risk counseling and prevention recommendations: -recommend heart healthy/Mediterranean diet, with whole grains, fruits, vegetable, fish, lean meats, nuts, and olive oil. Limit salt. -recommend moderate walking, 3-5 times/week for 30-50 minutes each session. Aim for at least 150 minutes.week. Goal should be pace of 3 miles/hours, or walking 1.5 miles in 30 minutes -recommend avoidance of tobacco products. Avoid excess alcohol. -ASCVD risk score: The ASCVD Risk score (Arnett DK, et al., 2019) failed to calculate for the following reasons:   The valid total cholesterol range is 130 to 320 mg/dL    Plan for follow up: 6 mos or sooner PRN  Medication Adjustments/Labs and Tests Ordered: Current medicines are reviewed at length with the patient today.  Concerns regarding medicines are outlined above.  Orders Placed This Encounter  Procedures   EKG 12-Lead   No orders of the defined types were placed in this encounter.  Patient Instructions  Medication Instructions:  Your Physician recommend you continue on your current medication as directed.    *If you need a refill on your cardiac medications before your next appointment, please call your pharmacy*   Lab Work: None ordered today   Testing/Procedures: None ordered today   Follow-Up: At [facility], you and your health needs are our  priority.  As part of our continuing mission to provide you with exceptional heart care, we have created designated Provider Care Teams.  These Care Teams include your primary Cardiologist (physician) and Advanced Practice Providers (APPs -  Physician Assistants and Nurse Practitioners) who all work together to provide you with  the care you need, when you need it.  We recommend signing up for the patient portal called "MyChart".  Sign up information is provided on this After Visit Summary.  MyChart is used to connect with patients for Virtual Visits (Telemedicine).  Patients are able to view lab/test results, encounter notes, upcoming appointments, etc.  Non-urgent messages can be sent to your provider as well.   To learn more about what you can do with MyChart, go to [email or web address].    Your next appointment:   6 month(s)  The format for your next appointment:   In Person  Provider:   Buford Dresser, MD{          Signed, Buford Dresser, MD PhD 01/25/2022 [place]

## 2022-01-25 NOTE — Patient Instructions (Signed)

## 2022-01-30 ENCOUNTER — Telehealth: Payer: Self-pay | Admitting: Internal Medicine

## 2022-01-30 ENCOUNTER — Other Ambulatory Visit: Payer: Self-pay | Admitting: Internal Medicine

## 2022-01-30 MED ORDER — ROSUVASTATIN CALCIUM 5 MG PO TABS: 5.0000 mg | ORAL_TABLET | Freq: Every day | ORAL | 1 refills | Status: DC

## 2022-01-30 NOTE — Telephone Encounter (Signed)
Pt made appt for 04/01/22 sent enough until appt.Marland KitchenJohny Nielsen

## 2022-01-30 NOTE — Telephone Encounter (Signed)
Caller & Relationship to patient: Tyreisha Ungar  Call back number: [phone]  Date of last office visit: 10/17/21  Date of next office visit: 04/18/22  Medication(s) to be refilled:  rosuvastatin (CRESTOR) 5 MG tablet  Preferred Pharmacy:  [place], [place] Phone:  [phone]  Fax:  [phone]

## 2022-04-02 ENCOUNTER — Encounter: Payer: Self-pay | Admitting: Gastroenterology

## 2022-04-10 ENCOUNTER — Telehealth: Payer: BC Managed Care – PPO

## 2022-04-10 NOTE — Telephone Encounter (Signed)
Patient would like to come here to do blood work prior to her physical on 10/18, wants to know if Dr.Plotnikov will place those orders.

## 2022-04-10 NOTE — Telephone Encounter (Signed)
Called pt inform pt cpx last already entered she is coming tomorrow to have done.Marland KitchenJohny Chess

## 2022-04-11 ENCOUNTER — Other Ambulatory Visit (INDEPENDENT_AMBULATORY_CARE_PROVIDER_SITE_OTHER): Payer: BC Managed Care – PPO

## 2022-04-11 DIAGNOSIS — Z0000 Encounter for general adult medical examination without abnormal findings: Secondary | ICD-10-CM | POA: Diagnosis not present

## 2022-04-11 LAB — CBC WITH DIFFERENTIAL/PLATELET
Basophils Absolute: 0.1 10*3/uL (ref 0.0–0.1)
Basophils Relative: 1.2 % (ref 0.0–3.0)
Eosinophils Absolute: 0.3 10*3/uL (ref 0.0–0.7)
Eosinophils Relative: 3.5 % (ref 0.0–5.0)
HCT: 39.1 % (ref 36.0–46.0)
Hemoglobin: 13.2 g/dL (ref 12.0–15.0)
Lymphocytes Relative: 35.8 % (ref 12.0–46.0)
Lymphs Abs: 2.7 10*3/uL (ref 0.7–4.0)
MCHC: 33.8 g/dL (ref 30.0–36.0)
MCV: 93.3 fl (ref 78.0–100.0)
Monocytes Absolute: 0.5 10*3/uL (ref 0.1–1.0)
Monocytes Relative: 6.9 % (ref 3.0–12.0)
Neutro Abs: 3.9 10*3/uL (ref 1.4–7.7)
Neutrophils Relative %: 52.6 % (ref 43.0–77.0)
Platelets: 249 10*3/uL (ref 150.0–400.0)
RBC: 4.19 Mil/uL (ref 3.87–5.11)
RDW: 13.2 % (ref 11.5–15.5)
WBC: 7.5 10*3/uL (ref 4.0–10.5)

## 2022-04-11 LAB — COMPREHENSIVE METABOLIC PANEL
ALT: 14 U/L (ref 0–35)
AST: 15 U/L (ref 0–37)
Albumin: 3.9 g/dL (ref 3.5–5.2)
Alkaline Phosphatase: 74 U/L (ref 39–117)
BUN: 11 mg/dL (ref 6–23)
CO2: 28 mEq/L (ref 19–32)
Calcium: 9.4 mg/dL (ref 8.4–10.5)
Chloride: 104 mEq/L (ref 96–112)
Creatinine, Ser: 1.14 mg/dL (ref 0.40–1.20)
GFR: 50.32 mL/min — ABNORMAL LOW (ref >60.00)
Glucose, Bld: 75 mg/dL (ref 70–99)
Potassium: 3.9 mEq/L (ref 3.5–5.1)
Sodium: 140 mEq/L (ref 135–145)
Total Bilirubin: 1.2 mg/dL (ref 0.2–1.2)
Total Protein: 6.8 g/dL (ref 6.0–8.3)

## 2022-04-11 LAB — URINALYSIS
Bilirubin Urine: NEGATIVE
Hgb urine dipstick: NEGATIVE
Ketones, ur: NEGATIVE
Leukocytes,Ua: NEGATIVE
Nitrite: NEGATIVE
Specific Gravity, Urine: 1.01 (ref 1.000–1.030)
Total Protein, Urine: NEGATIVE
Urine Glucose: NEGATIVE
Urobilinogen, UA: 0.2 (ref 0.0–1.0)
pH: 6.5 (ref 5.0–8.0)

## 2022-04-11 LAB — LIPID PANEL
Cholesterol: 146 mg/dL (ref 0–200)
HDL: 50.1 mg/dL (ref >39.00)
LDL Cholesterol: 80 mg/dL (ref 0–99)
NonHDL: 96.35
Total CHOL/HDL Ratio: 3
Triglycerides: 84 mg/dL (ref 0.0–149.0)
VLDL: 16.8 mg/dL (ref 0.0–40.0)

## 2022-04-11 LAB — TSH: TSH: 0.83 u[IU]/mL (ref 0.35–5.50)

## 2022-04-18 ENCOUNTER — Ambulatory Visit (INDEPENDENT_AMBULATORY_CARE_PROVIDER_SITE_OTHER): Payer: BC Managed Care – PPO | Admitting: Internal Medicine

## 2022-04-18 ENCOUNTER — Ambulatory Visit (AMBULATORY_SURGERY_CENTER): Payer: Self-pay | Admitting: *Deleted

## 2022-04-18 ENCOUNTER — Encounter: Payer: Self-pay | Admitting: Internal Medicine

## 2022-04-18 VITALS — Ht 70.0 in | Wt 199.6 lb

## 2022-04-18 VITALS — BP 112/69 | HR 79 | Temp 97.9°F | Ht 70.0 in | Wt 198.4 lb

## 2022-04-18 DIAGNOSIS — Z23 Encounter for immunization: Secondary | ICD-10-CM

## 2022-04-18 DIAGNOSIS — E785 Hyperlipidemia, unspecified: Secondary | ICD-10-CM

## 2022-04-18 DIAGNOSIS — F419 Anxiety disorder, unspecified: Secondary | ICD-10-CM | POA: Diagnosis not present

## 2022-04-18 DIAGNOSIS — I10 Essential (primary) hypertension: Secondary | ICD-10-CM | POA: Diagnosis not present

## 2022-04-18 DIAGNOSIS — Z0000 Encounter for general adult medical examination without abnormal findings: Secondary | ICD-10-CM

## 2022-04-18 DIAGNOSIS — Z86010 Personal history of colonic polyps: Secondary | ICD-10-CM

## 2022-04-18 DIAGNOSIS — M545 Low back pain, unspecified: Secondary | ICD-10-CM | POA: Diagnosis not present

## 2022-04-18 DIAGNOSIS — R635 Abnormal weight gain: Secondary | ICD-10-CM

## 2022-04-18 DIAGNOSIS — M1000 Idiopathic gout, unspecified site: Secondary | ICD-10-CM

## 2022-04-18 MED ORDER — ALPRAZOLAM 0.5 MG PO TABS: ORAL_TABLET | ORAL | 1 refills | Status: DC

## 2022-04-18 MED ORDER — ROSUVASTATIN CALCIUM 5 MG PO TABS: 5.0000 mg | ORAL_TABLET | Freq: Every day | ORAL | 3 refills | Status: DC

## 2022-04-18 MED ORDER — NA SULFATE-K SULFATE-MG SULF 17.5-3.13-1.6 GM/177ML PO SOLN: 1.0000 | Freq: Once | ORAL | 0 refills | Status: AC
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
## 2022-04-18 NOTE — Assessment & Plan Note (Signed)
Alprazolam prn  Potential benefits of a long term benzodiazepines  use as well as potential risks  and complications were explained to the patient and were aknowledged. 

## 2022-04-18 NOTE — Assessment & Plan Note (Signed)
Aorta atherosclerosis On Crestor

## 2022-04-18 NOTE — Assessment & Plan Note (Signed)
On Wegovy per Dr Harrell Gave - loosing wt

## 2022-04-18 NOTE — Assessment & Plan Note (Signed)
Cont on Irbesartan; Bumex stopped Reduce Irbesartan if low BP

## 2022-04-18 NOTE — Assessment & Plan Note (Deleted)
We discussed age appropriate health related issues, including available/recomended screening tests and vaccinations. Labs were ordered to be later reviewed . All questions were answered. We discussed one or more of the following - seat belt use, use of sunscreen/sun exposure exercise, fall risk reduction, second hand smoke exposure, firearm use and storage, seat belt use, a need for adhering to healthy diet and exercise. Labs were ordered.  All questions were answered. CT angio March 2019 - no CAD, PE Colon 2018 Dr Fuller Plan

## 2022-04-18 NOTE — Assessment & Plan Note (Signed)
Off inhalers
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
## 2022-04-18 NOTE — Assessment & Plan Note (Signed)
No relapse 

## 2022-04-18 NOTE — Progress Notes (Signed)

## 2022-04-19 ENCOUNTER — Telehealth: Payer: Self-pay | Admitting: Cardiology

## 2022-04-19 MED ORDER — SEMAGLUTIDE-WEIGHT MANAGEMENT 2.4 MG/0.75ML ~~LOC~~ SOAJ: 2.4000 mg | SUBCUTANEOUS | 11 refills | Status: DC

## 2022-04-19 NOTE — Telephone Encounter (Signed)
Refill request received without medication name.  Call to patient who states she needs a refill for [facility] for 2.4 mg.  Request forwarded to Wheeler RN CCM

## 2022-04-19 NOTE — Telephone Encounter (Signed)
*  STAT* If patient is at the pharmacy, call can be transferred to refill team.   1. Which medications need to be refilled? (please list name of each medication and dose if known) [place], [place]  2. Which pharmacy/location (including street and city if local pharmacy) is medication to be sent to? [place], [address]  3. Do they need a 30 day or 90 day supply? [place]

## 2022-05-02 ENCOUNTER — Telehealth: Payer: Self-pay | Admitting: Internal Medicine

## 2022-05-02 NOTE — Telephone Encounter (Signed)
Patient called because she received a bill for the appointment on 04/18/22 that was supposed to be a Physical and that it was coded as just a normal office visit. She asked if this could be fixed and if we can call her back at [phone]

## 2022-05-03 NOTE — Addendum Note (Signed)
Addended by: Cassandria Anger on: 05/03/2022 07:30 AM   Modules accepted: Level of Service

## 2022-05-03 NOTE — Assessment & Plan Note (Signed)
We discussed age appropriate health related issues, including available/recomended screening tests and vaccinations. Labs were ordered to be later reviewed . All questions were answered. We discussed one or more of the following - seat belt use, use of sunscreen/sun exposure exercise, fall risk reduction, second hand smoke exposure, firearm use and storage, seat belt use, a need for adhering to healthy diet and exercise. Labs were ordered.  All questions were answered. Last Colon 2018 Dr Fuller Plan

## 2022-05-03 NOTE — Telephone Encounter (Signed)
Corrected Thx

## 2022-05-28 ENCOUNTER — Encounter: Payer: Self-pay | Admitting: Gastroenterology

## 2022-05-28 ENCOUNTER — Ambulatory Visit (AMBULATORY_SURGERY_CENTER): Payer: BC Managed Care – PPO | Admitting: Gastroenterology

## 2022-05-28 VITALS — BP 119/59 | HR 71 | Temp 96.0°F | Resp 18 | Ht 70.0 in | Wt 199.6 lb

## 2022-05-28 DIAGNOSIS — Z86010 Personal history of colonic polyps: Secondary | ICD-10-CM | POA: Diagnosis not present

## 2022-05-28 DIAGNOSIS — Z09 Encounter for follow-up examination after completed treatment for conditions other than malignant neoplasm: Secondary | ICD-10-CM | POA: Diagnosis not present

## 2022-05-28 DIAGNOSIS — K635 Polyp of colon: Secondary | ICD-10-CM

## 2022-05-28 DIAGNOSIS — Z1211 Encounter for screening for malignant neoplasm of colon: Secondary | ICD-10-CM | POA: Diagnosis not present

## 2022-05-28 DIAGNOSIS — D124 Benign neoplasm of descending colon: Secondary | ICD-10-CM

## 2022-05-28 MED ORDER — SODIUM CHLORIDE 0.9 % IV SOLN: 500.0000 mL | Freq: Once | INTRAVENOUS | Status: DC

## 2022-05-28 NOTE — Op Note (Signed)
[facility] Patient Name: Courtney Nielsen Procedure Date: 05/28/2022 9:32 AM MRN: [id number] Endoscopist: Ladene Artist , MD, [id number] Age: 66 Referring MD:  Date of Birth: 23-Jul-1955 Gender: Female Account #: [id number] Procedure:                Colonoscopy Indications:              Surveillance: Personal history of adenomatous                            polyps on last colonoscopy 5 years ago Medicines:                Monitored Anesthesia Care Procedure:                Pre-Anesthesia Assessment:                           - Prior to the procedure, a History and Physical                            was performed, and patient medications and                            allergies were reviewed. The patient's tolerance of                            previous anesthesia was also reviewed. The risks                            and benefits of the procedure and the sedation                            options and risks were discussed with the patient.                            All questions were answered, and informed consent                            was obtained. Prior Anticoagulants: The patient has                            taken no anticoagulant or antiplatelet agents. ASA                            Grade Assessment: III - A patient with severe                            systemic disease. After reviewing the risks and                            benefits, the patient was deemed in satisfactory                            condition to undergo the procedure.  After obtaining informed consent, the colonoscope                            was passed under direct vision. Throughout the                            procedure, the patient's blood pressure, pulse, and                            oxygen saturations were monitored continuously. The                            Olympus PCF-H190DL [id number]) Colonoscope was                            introduced through  the anus and advanced to the the                            cecum, identified by appendiceal orifice and                            ileocecal valve. The ileocecal valve, appendiceal                            orifice, and rectum were photographed. The quality                            of the bowel preparation was good. The colonoscopy                            was performed without difficulty. The patient                            tolerated the procedure well. Scope In: 9:34:32 AM Scope Out: 9:51:48 AM Scope Withdrawal Time: 0 hours 13 minutes 6 seconds  Total Procedure Duration: 0 hours 17 minutes 16 seconds  Findings:                 The perianal and digital rectal examinations were                            normal.                           A 6 mm polyp was found in the descending colon. The                            polyp was sessile. The polyp was removed with a                            cold snare. Resection and retrieval were complete.                           Multiple medium-mouthed diverticula were found in  the left colon. There was evidence of diverticular                            spasm.                           Internal hemorrhoids were found during                            retroflexion. The hemorrhoids were small and Grade                            I (internal hemorrhoids that do not prolapse).                           The exam was otherwise without abnormality on                            direct and retroflexion views. Complications:            No immediate complications. Estimated blood loss:                            None. Estimated Blood Loss:     Estimated blood loss: none. Impression:               - One 6 mm polyp in the descending colon, removed                            with a cold snare. Resected and retrieved.                           - Moderate diverticulosis in the left colon.                           - Internal  hemorrhoids.                           - The examination was otherwise normal on direct                            and retroflexion views. Recommendation:           - Repeat colonoscopy after studies are complete for                            surveillance based on pathology results.                           - Patient has a contact number available for                            emergencies. The signs and symptoms of potential                            delayed complications were discussed with the  patient. Return to normal activities tomorrow.                            Written discharge instructions were provided to the                            patient.                           - High fiber diet.                           - Continue present medications.                           - Await pathology results. Ladene Artist, MD 05/28/2022 9:54:34 AM This report has been signed electronically.

## 2022-05-28 NOTE — Progress Notes (Signed)
Pt's states no medical or surgical changes since previsit or office visit. VS assessed by D.T 

## 2022-05-28 NOTE — Progress Notes (Signed)
Called to room to assist during endoscopic procedure.  Patient ID and intended procedure confirmed with present staff. Received instructions for my participation in the procedure from the performing physician.  

## 2022-05-28 NOTE — Progress Notes (Signed)
Pt resting comfortably. VSS. Airway intact. SBAR complete to RN. All questions answered.   

## 2022-05-28 NOTE — Patient Instructions (Signed)
Handout on polyps, diverticulosis given. High fiber diet.  Continue present medications.    YOU HAD AN ENDOSCOPIC PROCEDURE TODAY AT [facility]:   Refer to the procedure report that was given to you for any specific questions about what was found during the examination.  If the procedure report does not answer your questions, please call your gastroenterologist to clarify.  If you requested that your care partner not be given the details of your procedure findings, then the procedure report has been included in a sealed envelope for you to review at your convenience later.  YOU SHOULD EXPECT: Some feelings of bloating in the abdomen. Passage of more gas than usual.  Walking can help get rid of the air that was put into your GI tract during the procedure and reduce the bloating. If you had a lower endoscopy (such as a colonoscopy or flexible sigmoidoscopy) you may notice spotting of blood in your stool or on the toilet paper. If you underwent a bowel prep for your procedure, you may not have a normal bowel movement for a few days.  Please Note:  You might notice some irritation and congestion in your nose or some drainage.  This is from the oxygen used during your procedure.  There is no need for concern and it should clear up in a day or so.  SYMPTOMS TO REPORT IMMEDIATELY:  Following lower endoscopy (colonoscopy or flexible sigmoidoscopy):  Excessive amounts of blood in the stool  Significant tenderness or worsening of abdominal pains  Swelling of the abdomen that is new, acute  Fever of 100F or higher   For urgent or emergent issues, a gastroenterologist can be reached at any hour by calling [phone]. Do not use MyChart messaging for urgent concerns.    DIET:  We do recommend a small meal at first, but then you may proceed to your regular diet.  Drink plenty of fluids but you should avoid alcoholic beverages for 24 hours.  ACTIVITY:  You should plan to take it easy  for the rest of today and you should NOT DRIVE or use heavy machinery until tomorrow (because of the sedation medicines used during the test).    FOLLOW UP: Our staff will call the number listed on your records the next business day following your procedure.  We will call around 7:15- 8:00 am to check on you and address any questions or concerns that you may have regarding the information given to you following your procedure. If we do not reach you, we will leave a message.     If any biopsies were taken you will be contacted by phone or by letter within the next 1-3 weeks.  Please call us at [phone] if you have not heard about the biopsies in 3 weeks.    SIGNATURES/CONFIDENTIALITY: You and/or your care partner have signed paperwork which will be entered into your electronic medical record.  These signatures attest to the fact that that the information above on your After Visit Summary has been reviewed and is understood.  Full responsibility of the confidentiality of this discharge information lies with you and/or your care-partner.

## 2022-05-28 NOTE — Progress Notes (Signed)
History & Physical  Primary Care Physician:  Cassandria Anger, MD Primary Gastroenterologist: Lucio Edward, MD  CHIEF COMPLAINT:  Personal history of colon polyps   HPI: Courtney Nielsen is a 66 y.o. female with a personal history of adenomatous colon polyps for surveillance colonoscopy.   Past Medical History:  Diagnosis Date   Allergic rhinitis    Allergy    Arthritis    back,knee   Cataract    right eye removed with lens implant    Cataracts, bilateral    lens implants   Chest pain    Colonic polyp    COPD (chronic obstructive pulmonary disease) ([place])    patient denies at preop on ;    Dysrhythmia    hx of every third beat is " off" per patient    Family history of adverse reaction to anesthesia    mother slow to wake up    Hyperlipidemia    Hypertension    Osteoporosis    Pneumonia    hx of 2014    Smoker    Vitamin B12 deficiency     Past Surgical History:  Procedure Laterality Date   CARDIAC CATHETERIZATION     cath negative per patient    CATARACT EXTRACTION W/PHACO Right 06/11/2016   Procedure: CATARACT EXTRACTION PHACO AND INTRAOCULAR LENS PLACEMENT RIGHT EYE CDE=6.15;  Surgeon: Tonny Branch, MD;  Location: [facility];  Service: Ophthalmology;  Laterality: Right;  right   CATARACT EXTRACTION W/PHACO Left 05/11/2019   Procedure: CATARACT EXTRACTION PHACO AND INTRAOCULAR LENS PLACEMENT LEFT EYE CDE=5.67;  Surgeon: Baruch Goldmann, MD;  Location: [facility];  Service: Ophthalmology;  Laterality: Left;  left   CHOLECYSTECTOMY N/A 02/22/2020   Procedure: LAPAROSCOPIC CHOLECYSTECTOMY WITH INTRAOPERATIVE CHOLANGIOGRAM;  Surgeon: Johnathan Hausen, MD;  Location: [facility];  Service: General;  Laterality: N/A;   COLONOSCOPY     JOINT REPLACEMENT     left hip - 08/2019    KNEE ARTHROSCOPY Right 10/19/2015   Procedure: RIGHT KNEE SCOPE WITH MENISCAL DEBRIDEMENT,LATERAL SYNOVECTOMY;  Surgeon: Gaynelle Arabian, MD;  Location: [facility];  Service: Orthopedics;  Laterality: Right;    POLYPECTOMY     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 Authorizing Provider  ALPRAZolam Duanne Moron) 0.5 MG tablet TAKE (1) TABLET BY MOUTH AT BEDTIME. 04/18/22  Yes Plotnikov, Evie Lacks, MD  Ascorbic Acid (VITAMIN C) 1000 MG tablet Take 1 tablet by mouth daily. 09/14/20  Yes [provider]  aspirin EC 81 MG tablet Take 81 mg by mouth at bedtime.    Yes [provider]  B Complex-C (SUPER B COMPLEX/VITAMIN C PO) Take 1 tablet by mouth daily. 09/14/20  Yes [provider]  Cholecalciferol (VITAMIN D3) 50 MCG (2000 UT) TABS Take 2,000 Units by mouth at bedtime.   Yes [provider]  irbesartan (AVAPRO) 300 MG tablet TAKE 1 TABLET AT BEDTIME 06/07/21  Yes Plotnikov, Evie Lacks, MD  loratadine (CLARITIN) 10 MG tablet Take 10 mg by mouth at bedtime.   Yes [provider]  Omega-3 Fatty Acids (FISH OIL) 500 MG CAPS Take 500 mg by mouth at bedtime.    Yes [provider]  rosuvastatin (CRESTOR) 5 MG tablet Take 1 tablet (5 mg total) by mouth daily. Must keep Oct appt for future  refills 04/18/22  Yes Plotnikov, Evie Lacks, MD  Semaglutide-Weight Management 2.4 MG/0.75ML SOAJ Inject 2.4 mg into the skin once a week. 04/19/22  Yes Buford Dresser, MD  vitamin B-12 (CYANOCOBALAMIN) 1000 MCG tablet Take 1,000 mcg by mouth at bedtime.   Yes [provider]  acetaminophen (TYLENOL) 500 MG tablet Take 1,000 mg by mouth every 6 (six) hours as needed for moderate pain.     [provider]  Menthol, Topical Analgesic, (BLUE-EMU MAXIMUM STRENGTH EX) Apply 1 application topically daily.    [provider]  naproxen sodium (ALEVE) 220 MG tablet Take 220 mg by mouth daily as needed.    [provider]    Current  Outpatient Medications  Medication Sig Dispense Refill   ALPRAZolam (XANAX) 0.5 MG tablet TAKE (1) TABLET BY MOUTH AT BEDTIME. 90 tablet 1   Ascorbic Acid (VITAMIN C) 1000 MG tablet Take 1 tablet by mouth daily.     aspirin EC 81 MG tablet Take 81 mg by mouth at bedtime.      B Complex-C (SUPER B COMPLEX/VITAMIN C PO) Take 1 tablet by mouth daily.     Cholecalciferol (VITAMIN D3) 50 MCG (2000 UT) TABS Take 2,000 Units by mouth at bedtime.     irbesartan (AVAPRO) 300 MG tablet TAKE 1 TABLET AT BEDTIME 90 tablet 3   loratadine (CLARITIN) 10 MG tablet Take 10 mg by mouth at bedtime.     Omega-3 Fatty Acids (FISH OIL) 500 MG CAPS Take 500 mg by mouth at bedtime.      rosuvastatin (CRESTOR) 5 MG tablet Take 1 tablet (5 mg total) by mouth daily. Must keep Oct appt for future refills 90 tablet 3   Semaglutide-Weight Management 2.4 MG/0.75ML SOAJ Inject 2.4 mg into the skin once a week. 3 mL 11   vitamin B-12 (CYANOCOBALAMIN) 1000 MCG tablet Take 1,000 mcg by mouth at bedtime.     acetaminophen (TYLENOL) 500 MG tablet Take 1,000 mg by mouth every 6 (six) hours as needed for moderate pain.      Menthol, Topical Analgesic, (BLUE-EMU MAXIMUM STRENGTH EX) Apply 1 application topically daily.     naproxen sodium (ALEVE) 220 MG tablet Take 220 mg by mouth daily as needed.     Current Facility-Administered Medications  Medication Dose Route Frequency Provider Last Rate Last Admin   0.9 %  sodium chloride infusion  500 mL Intravenous Once Ladene Artist, MD        Allergies as of 05/28/2022 - Review Complete 05/28/2022  Allergen Reaction Noted   Sulfur Nausea Only 03/22/2021   Meloxicam Swelling and Other (See Comments) 02/22/2012   Dilaudid [hydromorphone hcl]  03/15/2016   Levaquin [levofloxacin]  04/11/2016   Sulfa antibiotics  03/22/2021   Ceftin [cefuroxime axetil] Itching and Other (See Comments) 04/23/2011   Coreg [carvedilol] Nausea Only 01/15/2012   Flagyl [metronidazole] Itching 09/11/2019    Furosemide Other (See Comments) 03/16/2010   Prednisone Itching and Other (See Comments) 02/20/2013   Sulfonamide derivatives Nausea And Vomiting    Verapamil Other (See Comments) 01/11/2012    Family History  Problem Relation Age of Onset   Arthritis Mother    Heart disease Mother    COPD Father    Cancer Father        Throat and stomach   Arthritis Father    Lung cancer Father    Lymphoma Father    Esophageal cancer Father    Asthma Brother    Diabetes Maternal Aunt  Emphysema Paternal Grandfather    Hypertension Other    Colon cancer Neg Hx    Colon polyps Neg Hx    Rectal cancer Neg Hx    Stomach cancer Neg Hx    Crohn's disease Neg Hx    Ulcerative colitis Neg Hx     Social History   Socioeconomic History   Marital status: Married    Spouse name: Not on file   Number of children: 1   Years of education: 14   Highest education level: Not on file  Occupational History   Not on file  Tobacco Use   Smoking status: Former    Packs/day: 1.00    Years: 20.00    Total pack years: 20.00    Types: Cigarettes    Quit date: 07/03/2011    Years since quitting: 10.9    Passive exposure: Never   Smokeless tobacco: Never  Vaping Use   Vaping Use: Never used  Substance and Sexual Activity   Alcohol use: No   Drug use: No   Sexual activity: Yes    Birth control/protection: Post-menopausal  Other Topics Concern   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   Physical Exam: General:  Alert, well-developed, in NAD Head:  Normocephalic and  atraumatic. Eyes:  Sclera clear, no icterus.   Conjunctiva pink. Ears:  Normal auditory acuity. Mouth:  No deformity or lesions.  Neck:  Supple; no masses . Lungs:  Clear throughout to auscultation.   No wheezes, crackles, or rhonchi. No acute distress. Heart:  Regular rate and rhythm; no murmurs. Abdomen:  Soft, nondistended, nontender. No masses, hepatomegaly. No obvious masses.  Normal bowel .    Rectal:  Deferred   Msk:  Symmetrical without gross deformities.. Pulses:  Normal pulses noted. Extremities:  Without edema. Neurologic:  Alert and  oriented x4;  grossly normal neurologically. Skin:  Intact without significant lesions or rashes. Cervical Nodes:  No significant cervical adenopathy. Psych:  Alert and cooperative. Normal mood and affect.  Impression / Plan:   Personal history of adenomatous colon polyps for surveillance colonoscopy.  Pricilla Riffle. Fuller Plan  05/28/2022, 9:26 AM See Shea Evans, [facility], to contact our on call provider

## 2022-05-29 ENCOUNTER — Telehealth: Payer: Self-pay | Admitting: *Deleted

## 2022-05-29 NOTE — Telephone Encounter (Signed)
Post procedure follow up phone call. No answer at number given.  Left message on voicemail.  

## 2022-06-04 ENCOUNTER — Other Ambulatory Visit: Payer: Self-pay | Admitting: Internal Medicine

## 2022-06-04 DIAGNOSIS — I10 Essential (primary) hypertension: Secondary | ICD-10-CM

## 2022-06-13 ENCOUNTER — Encounter: Payer: Self-pay | Admitting: Gastroenterology

## 2022-06-13 DIAGNOSIS — Z96651 Presence of right artificial knee joint: Secondary | ICD-10-CM | POA: Diagnosis not present

## 2022-06-27 ENCOUNTER — Telehealth: Payer: Self-pay | Admitting: Cardiology

## 2022-06-27 MED ORDER — SEMAGLUTIDE-WEIGHT MANAGEMENT 2.4 MG/0.75ML ~~LOC~~ SOAJ: 2.4000 mg | SUBCUTANEOUS | 11 refills | Status: DC

## 2022-06-27 NOTE — Telephone Encounter (Signed)
Pt c/o medication issue:  1. Name of Medication:   Semaglutide-Weight Management 2.4 MG/0.75ML SOAJ    2. How are you currently taking this medication (dosage and times per day)?   Inject 2.4 mg into the skin once a week.    3. Are you having a reaction (difficulty breathing--STAT)? No  4. What is your medication issue? Pt calling to f/u on Prior Auth that pharmacy stated that they faxed yesterday for above medication. Please advise

## 2022-06-27 NOTE — Telephone Encounter (Signed)
Prior auth approved through 06/26/23, pt made aware and refill sent in.

## 2022-06-27 NOTE — Telephone Encounter (Signed)
FYI

## 2022-06-27 NOTE — Telephone Encounter (Signed)
Prior auth submitted, key [id number].

## 2022-07-03 DIAGNOSIS — Z96642 Presence of left artificial hip joint: Secondary | ICD-10-CM | POA: Diagnosis not present

## 2022-07-03 DIAGNOSIS — Z96651 Presence of right artificial knee joint: Secondary | ICD-10-CM | POA: Diagnosis not present

## 2022-08-22 ENCOUNTER — Ambulatory Visit (HOSPITAL_BASED_OUTPATIENT_CLINIC_OR_DEPARTMENT_OTHER): Payer: BC Managed Care – PPO | Admitting: Cardiology

## 2022-09-04 ENCOUNTER — Ambulatory Visit (HOSPITAL_BASED_OUTPATIENT_CLINIC_OR_DEPARTMENT_OTHER): Payer: BC Managed Care – PPO | Admitting: Cardiology

## 2022-09-04 DIAGNOSIS — Z6828 Body mass index (BMI) 28.0-28.9, adult: Secondary | ICD-10-CM | POA: Diagnosis not present

## 2022-09-04 DIAGNOSIS — Z1231 Encounter for screening mammogram for malignant neoplasm of breast: Secondary | ICD-10-CM | POA: Diagnosis not present

## 2022-09-04 DIAGNOSIS — Z01419 Encounter for gynecological examination (general) (routine) without abnormal findings: Secondary | ICD-10-CM | POA: Diagnosis not present

## 2022-09-11 ENCOUNTER — Encounter (HOSPITAL_BASED_OUTPATIENT_CLINIC_OR_DEPARTMENT_OTHER): Payer: Self-pay | Admitting: Cardiology

## 2022-09-11 ENCOUNTER — Ambulatory Visit (HOSPITAL_BASED_OUTPATIENT_CLINIC_OR_DEPARTMENT_OTHER): Payer: BC Managed Care – PPO | Admitting: Cardiology

## 2022-09-11 VITALS — BP 117/75 | HR 90 | Ht 70.0 in | Wt 192.0 lb

## 2022-09-11 DIAGNOSIS — E785 Hyperlipidemia, unspecified: Secondary | ICD-10-CM | POA: Diagnosis not present

## 2022-09-11 DIAGNOSIS — I10 Essential (primary) hypertension: Secondary | ICD-10-CM | POA: Diagnosis not present

## 2022-09-11 DIAGNOSIS — Z7189 Other specified counseling: Secondary | ICD-10-CM

## 2022-09-11 NOTE — Patient Instructions (Signed)
Medication Instructions:  The current medical regimen is effective;  continue present plan and medications.   *If you need a refill on your cardiac medications before your next appointment, please call your pharmacy*   Lab Work: None  Testing/Procedures: None   Follow-Up: At [facility], you and your health needs are our priority.  As part of our continuing mission to provide you with exceptional heart care, we have created designated Provider Care Teams.  These Care Teams include your primary Cardiologist (physician) and Advanced Practice Providers (APPs -  Physician Assistants and Nurse Practitioners) who all work together to provide you with the care you need, when you need it.  We recommend signing up for the patient portal called "MyChart".  Sign up information is provided on this After Visit Summary.  MyChart is used to connect with patients for Virtual Visits (Telemedicine).  Patients are able to view lab/test results, encounter notes, upcoming appointments, etc.  Non-urgent messages can be sent to your provider as well.   To learn more about what you can do with MyChart, go to [email or web address].    Your next appointment:   6 month(s)  Provider:   Bridgette Christopher, MD    Other Instructions None  

## 2022-09-11 NOTE — Progress Notes (Signed)
Cardiology Office Note:    Date:  09/11/2022   ID:  Courtney Nielsen, DOB 1956/01/31, MRN [id number]  PCP:  Tresa Garter, MD  Cardiologist:  Jodelle Red, MD PhD  Referring MD: Tresa Garter, MD   CC: follow up  History of Present Illness:    Courtney Nielsen is a 67 y.o. female with a hx of COPD, hypertension who is seen for follow up. I initially saw her 12/26/18 as a new consult at the request of Plotnikov, Georgina Quint, MD for the evaluation and management of shortness of breath, edema, and chest pain.  Cardiac history: Echocardiogram 12/24/18 showed EF 55-60%, grade 1 diastolic dysfunction. No significant valve disease. Labs showed Cr 1.03, nl TSH, largely nl CBC (borderline eosinophils), normal LFTs, normal D dimer, normal BNP of 12.  Last visit she had lost 40 lbs on Wegovy and had improved her diet. She was only limited by chronic back pain. Her blood pressure ad been improving and he BLE edema was resolved.  Today, the patient states that she has been feeling good. Her BMI has lowered from 35 to 27.5 with semaglutide. She has had no issues recently aside from a cough a few months ago with no fever.   We discussed activity and diet in conjunction with Wegovy and weight loss.  She denies any palpitations, chest pain, shortness of breath, or peripheral edema. No lightheadedness, headaches, syncope, orthopnea, or PND.  Past Medical History:  Diagnosis Date   Allergic rhinitis    Allergy    Arthritis    back,knee   Cataract    right eye removed with lens implant    Cataracts, bilateral    lens implants   Chest pain    Colonic polyp    COPD (chronic obstructive pulmonary disease) (HCC)    patient denies at preop on ;    Dysrhythmia    hx of every third beat is " off" per patient    Family history of adverse reaction to anesthesia    mother slow to wake up    Hyperlipidemia    Hypertension    Osteoporosis    Pneumonia    hx of 2014    Smoker     Vitamin B12 deficiency     Past Surgical History:  Procedure Laterality Date   CARDIAC CATHETERIZATION     cath negative per patient    CATARACT EXTRACTION W/PHACO Right 06/11/2016   Procedure: CATARACT EXTRACTION PHACO AND INTRAOCULAR LENS PLACEMENT RIGHT EYE CDE=6.15;  Surgeon: Gemma Payor, MD;  Location: [facility];  Service: Ophthalmology;  Laterality: Right;  right   CATARACT EXTRACTION W/PHACO Left 05/11/2019   Procedure: CATARACT EXTRACTION PHACO AND INTRAOCULAR LENS PLACEMENT LEFT EYE CDE=5.67;  Surgeon: Fabio Pierce, MD;  Location: [facility];  Service: Ophthalmology;  Laterality: Left;  left   CHOLECYSTECTOMY N/A 02/22/2020   Procedure: LAPAROSCOPIC CHOLECYSTECTOMY WITH INTRAOPERATIVE CHOLANGIOGRAM;  Surgeon: Luretha Murphy, MD;  Location: [facility];  Service: General;  Laterality: N/A;   COLONOSCOPY     JOINT REPLACEMENT     left hip - 08/2019    KNEE ARTHROSCOPY Right 10/19/2015   Procedure: RIGHT KNEE SCOPE WITH MENISCAL DEBRIDEMENT,LATERAL SYNOVECTOMY;  Surgeon: Ollen Gross, MD;  Location: [facility];  Service: Orthopedics;  Laterality: Right;   POLYPECTOMY     TOTAL KNEE ARTHROPLASTY Right 12/03/2016   Procedure: RIGHT TOTAL KNEE ARTHROPLASTY;  Surgeon: Ollen Gross, MD;  Location: [facility];  Service: Orthopedics;  Laterality: Right;    Current  Medications: Current Outpatient Medications on File Prior to Visit  Medication Sig   acetaminophen (TYLENOL) 500 MG tablet Take 1,000 mg by mouth every 6 (six) hours as needed for moderate pain.    ALPRAZolam (XANAX) 0.5 MG tablet TAKE (1) TABLET BY MOUTH AT BEDTIME.   Ascorbic Acid (VITAMIN C) 1000 MG tablet Take 1 tablet by mouth daily.   aspirin EC 81 MG tablet Take 81 mg by mouth at bedtime.    B Complex-C (SUPER B COMPLEX/VITAMIN C PO) Take 1 tablet by mouth daily.   Cholecalciferol (VITAMIN D3) 50 MCG (2000 UT) TABS Take 2,000 Units by mouth at bedtime.   irbesartan (AVAPRO) 300 MG tablet TAKE 1 TABLET AT BEDTIME   loratadine (CLARITIN)  10 MG tablet Take 10 mg by mouth at bedtime.   Menthol, Topical Analgesic, (BLUE-EMU MAXIMUM STRENGTH EX) Apply 1 application topically daily.   naproxen sodium (ALEVE) 220 MG tablet Take 220 mg by mouth daily as needed.   Omega-3 Fatty Acids (FISH OIL) 500 MG CAPS Take 500 mg by mouth at bedtime.    rosuvastatin (CRESTOR) 5 MG tablet Take 1 tablet (5 mg total) by mouth daily. Must keep Oct appt for future refills   Semaglutide-Weight Management 2.4 MG/0.75ML SOAJ Inject 2.4 mg into the skin once a week.   vitamin B-12 (CYANOCOBALAMIN) 1000 MCG tablet Take 1,000 mcg by mouth at bedtime.   No current facility-administered medications on file prior to visit.     Allergies:   Sulfur, Meloxicam, Dilaudid [hydromorphone hcl], Levaquin [levofloxacin], Sulfa antibiotics, Ceftin [cefuroxime axetil], Coreg [carvedilol], Flagyl [metronidazole], Furosemide, Prednisone, Sulfonamide derivatives, and Verapamil   Social History   Tobacco Use   Smoking status: Former    Packs/day: 1.00    Years: 20.00    Total pack years: 20.00    Types: Cigarettes    Quit date: 07/03/2011    Years since quitting: 11.2    Passive exposure: Never   Smokeless tobacco: Never  Vaping Use   Vaping Use: Never used  Substance Use Topics   Alcohol use: No   Drug use: No    Family History: The patient's family history includes Arthritis in her father and mother; Asthma in her brother; COPD in her father; Cancer in her father; Diabetes in her maternal aunt; Emphysema in her paternal grandfather; Esophageal cancer in her father; Heart disease in her mother; Hypertension in an other family member; Lung cancer in her father; Lymphoma in her father. There is no history of Colon cancer, Colon polyps, Rectal cancer, Stomach cancer, Crohn's disease, or Ulcerative colitis.  ROS:   Please see the history of present illness.   (+) cough Additional pertinent ROS otherwise unremarkable.   EKGs/Labs/Other Studies Reviewed:    The  following studies were reviewed today:  Echo 12/24/18  1. The left ventricle has normal systolic function, with an ejection fraction of 55-60%. The cavity size was normal. There is mildly increased left ventricular wall thickness. Left ventricular diastolic Doppler parameters are consistent with impaired  relaxation.  2. The right ventricle has normal systolic function. The cavity was normal.  3. The mitral valve is grossly normal.  4. The tricuspid valve is grossly normal.  5. The aortic valve is tricuspid. No stenosis of the aortic valve.  6. Normal LV systolic function; mild diastolic dysfunction; mild LVH.  EKG:  EKG is personally reviewed.    09/11/22: not ordered 01/25/22: NSR at 84 bpm 01/18/2021:  NSR at 83 bpm 01/07/2020: NSR at 66  bpm  Recent Labs: 04/11/2022: ALT 14; BUN 11; Creatinine, Ser 1.14; Hemoglobin 13.2; Platelets 249.0; Potassium 3.9; Sodium 140; TSH 0.83  Recent Lipid Panel    Component Value Date/Time   CHOL 146 04/11/2022 0826   CHOL 123 08/02/2021 0905   TRIG 84.0 04/11/2022 0826   HDL 50.10 04/11/2022 0826   HDL 48 08/02/2021 0905   CHOLHDL 3 04/11/2022 0826   VLDL 16.8 04/11/2022 0826   LDLCALC 80 04/11/2022 0826   LDLCALC 56 08/02/2021 0905   LDLDIRECT 164.6 01/05/2008 0844    Physical Exam:    VS:  BP 117/75   Pulse 90   Ht 5\' 10"  (1.778 m)   Wt 192 lb (87.1 kg)   BMI 27.55 kg/m     Wt Readings from Last 3 Encounters:  09/11/22 192 lb (87.1 kg)  05/28/22 199 lb 9.6 oz (90.5 kg)  04/18/22 199 lb 9.6 oz (90.5 kg)    GEN: Well nourished, well developed in no acute distress HEENT: Normal, moist mucous membranes NECK: No JVD CARDIAC: regular rhythm, normal S1 and S2, no rubs or gallops. No murmur. VASCULAR: Radial and DP pulses 2+ bilaterally. No carotid bruits RESPIRATORY:  Clear to auscultation without rales, wheezing or rhonchi  ABDOMEN: Soft, non-tender, non-distended MUSCULOSKELETAL:  Ambulates independently SKIN: Warm and dry, no  edema NEUROLOGIC:  Alert and oriented x 3. No focal neuro deficits noted. PSYCHIATRIC:  Normal affect    ASSESSMENT:    1. Essential hypertension   2. Dyslipidemia   3. Counseling on health promotion and disease prevention   4. Cardiac risk counseling     PLAN:    Hypertension: goal <130/80.  -excellent today -on irbesartan, continue. Can consider cutting back in the future if BP/weight continues to drop  Obesity: BMI 35 with CV risk factors-->BMI 27 today -doing very well on Wegovy 2.4 mg weekly. -height is 5 ft 10.5. Goal weight 180 lbs per patient. A weight of 175 would put her BMI under 25. -shortness of breath, LE edema totally resolved with weight loss -continue current dose of Wegovy. As she approaches her goal way, would start to taper down the dose  Hypercholesterolemia -continue rosuvastatin -on aspirin per personal preference  Cardiac risk counseling and prevention recommendations: -recommend heart healthy/Mediterranean diet, with whole grains, fruits, vegetable, fish, lean meats, nuts, and olive oil. Limit salt. -recommend moderate walking, 3-5 times/week for 30-50 minutes each session. Aim for at least 150 minutes.week. Goal should be pace of 3 miles/hours, or walking 1.5 miles in 30 minutes -recommend avoidance of tobacco products. Avoid excess alcohol. -ASCVD risk score: The 10-year ASCVD risk score (Arnett DK, et al., 2019) is: 6.4%   Values used to calculate the score:     Age: 70 years     Sex: Female     Is Non-Hispanic African American: No     Diabetic: No     Tobacco smoker: No     Systolic Blood Pressure: 119 mmHg     Is BP treated: Yes     HDL Cholesterol: 50.1 mg/dL     Total Cholesterol: 146 mg/dL    Plan for follow up: 6 months  Medication Adjustments/Labs and Tests Ordered: Current medicines are reviewed at length with the patient today.  Concerns regarding medicines are outlined above.  No orders of the defined types were placed in this  encounter.  No orders of the defined types were placed in this encounter.  Patient Instructions  Medication Instructions:  The current medical  regimen is effective;  continue present plan and medications.   *If you need a refill on your cardiac medications before your next appointment, please call your pharmacy*   Lab Work: None    Testing/Procedures: None   Follow-Up: At [facility], you and your health needs are our priority.  As part of our continuing mission to provide you with exceptional heart care, we have created designated Provider Care Teams.  These Care Teams include your primary Cardiologist (physician) and Advanced Practice Providers (APPs -  Physician Assistants and Nurse Practitioners) who all work together to provide you with the care you need, when you need it.  We recommend signing up for the patient portal called "MyChart".  Sign up information is provided on this After Visit Summary.  MyChart is used to connect with patients for Virtual Visits (Telemedicine).  Patients are able to view lab/test results, encounter notes, upcoming appointments, etc.  Non-urgent messages can be sent to your provider as well.   To learn more about what you can do with MyChart, go to [email or web address].    Your next appointment:   6 month(s)  Provider:   Jodelle Red, MD    Other Instructions None    I,Coren O'Brien,acting as a scribe for Jodelle Red, MD.,have documented all relevant documentation on the behalf of Jodelle Red, MD,as directed by  Jodelle Red, MD while in the presence of Jodelle Red, MD.  I, Jodelle Red, MD, have reviewed all documentation for this visit. The documentation on 10/07/22 for the exam, diagnosis, procedures, and orders are all accurate and complete.

## 2022-10-07 ENCOUNTER — Encounter (HOSPITAL_BASED_OUTPATIENT_CLINIC_OR_DEPARTMENT_OTHER): Payer: Self-pay | Admitting: Cardiology

## 2022-10-17 ENCOUNTER — Ambulatory Visit: Payer: BC Managed Care – PPO | Admitting: Internal Medicine

## 2022-10-17 ENCOUNTER — Encounter: Payer: Self-pay | Admitting: Internal Medicine

## 2022-10-17 VITALS — BP 118/72 | HR 80 | Temp 98.2°F | Ht 70.0 in | Wt 196.0 lb

## 2022-10-17 DIAGNOSIS — E785 Hyperlipidemia, unspecified: Secondary | ICD-10-CM

## 2022-10-17 DIAGNOSIS — M545 Low back pain, unspecified: Secondary | ICD-10-CM

## 2022-10-17 DIAGNOSIS — F419 Anxiety disorder, unspecified: Secondary | ICD-10-CM | POA: Diagnosis not present

## 2022-10-17 DIAGNOSIS — I10 Essential (primary) hypertension: Secondary | ICD-10-CM

## 2022-10-17 DIAGNOSIS — E669 Obesity, unspecified: Secondary | ICD-10-CM

## 2022-10-17 LAB — COMPREHENSIVE METABOLIC PANEL
ALT: 17 U/L (ref 0–35)
AST: 16 U/L (ref 0–37)
Albumin: 4.3 g/dL (ref 3.5–5.2)
Alkaline Phosphatase: 82 U/L (ref 39–117)
BUN: 14 mg/dL (ref 6–23)
CO2: 31 mEq/L (ref 19–32)
Calcium: 9.7 mg/dL (ref 8.4–10.5)
Chloride: 101 mEq/L (ref 96–112)
Creatinine, Ser: 0.91 mg/dL (ref 0.40–1.20)
GFR: 65.71 mL/min (ref >60.00)
Glucose, Bld: 74 mg/dL (ref 70–99)
Potassium: 4.6 mEq/L (ref 3.5–5.1)
Sodium: 138 mEq/L (ref 135–145)
Total Bilirubin: 1.4 mg/dL — ABNORMAL HIGH (ref 0.2–1.2)
Total Protein: 7.2 g/dL (ref 6.0–8.3)

## 2022-10-17 NOTE — Assessment & Plan Note (Signed)
Cont on Alprazolam prn ° Potential benefits of a long term benzodiazepines  use as well as potential risks  and complications were explained to the patient and were aknowledged. °

## 2022-10-17 NOTE — Assessment & Plan Note (Addendum)
Cont on Irbesartan; Bumex stopped Reduce Irbesartan if low BP Pt lost 52 lbs on Wegovy

## 2022-10-17 NOTE — Assessment & Plan Note (Signed)
Pt lost 52 lbs on [facility]

## 2022-10-17 NOTE — Progress Notes (Signed)
Subjective:  Patient ID: Courtney Nielsen, female    DOB: [birth year]  Age: 67 y.o. MRN: [id number]  CC: No chief complaint on file.   HPI [name] presents for anxiety, HTN, dyslipidemia Pt lost 52 lbs on Wegovy  Outpatient Medications Prior to Visit  Medication Sig Dispense Refill   acetaminophen (TYLENOL) 500 MG tablet Take 1,000 mg by mouth every 6 (six) hours as needed for moderate pain.      ALPRAZolam (XANAX) 0.5 MG tablet TAKE (1) TABLET BY MOUTH AT BEDTIME. 90 tablet 1   Ascorbic Acid (VITAMIN C) 1000 MG tablet Take 1 tablet by mouth daily.     aspirin EC 81 MG tablet Take 81 mg by mouth at bedtime.      B Complex-C (SUPER B COMPLEX/VITAMIN C PO) Take 1 tablet by mouth daily.     Cholecalciferol (VITAMIN D3) 50 MCG (2000 UT) TABS Take 2,000 Units by mouth at bedtime.     irbesartan (AVAPRO) 300 MG tablet TAKE 1 TABLET AT BEDTIME 90 tablet 3   loratadine (CLARITIN) 10 MG tablet Take 10 mg by mouth at bedtime.     Menthol, Topical Analgesic, (BLUE-EMU MAXIMUM STRENGTH EX) Apply 1 application topically daily.     naproxen sodium (ALEVE) 220 MG tablet Take 220 mg by mouth daily as needed.     Omega-3 Fatty Acids (FISH OIL) 500 MG CAPS Take 500 mg by mouth at bedtime.      rosuvastatin (CRESTOR) 5 MG tablet Take 1 tablet (5 mg total) by mouth daily. Must keep Oct appt for future refills 90 tablet 3   Semaglutide-Weight Management 2.4 MG/0.75ML SOAJ Inject 2.4 mg into the skin once a week. 3 mL 11   vitamin B-12 (CYANOCOBALAMIN) 1000 MCG tablet Take 1,000 mcg by mouth at bedtime.     No facility-administered medications prior to visit.    ROS: Review of Systems  Constitutional:  Negative for activity change, appetite change, chills, fatigue and unexpected weight change.  HENT:  Negative for congestion, mouth sores and sinus pressure.   Eyes:  Negative for visual disturbance.  Respiratory:  Negative for cough and chest tightness.   Gastrointestinal:  Negative for  abdominal pain and nausea.  Genitourinary:  Negative for difficulty urinating, frequency and vaginal pain.  Musculoskeletal:  Positive for arthralgias. Negative for back pain and gait problem.  Skin:  Negative for pallor and rash.  Neurological:  Negative for dizziness, tremors, weakness, numbness and headaches.  Psychiatric/Behavioral:  Negative for confusion, sleep disturbance and suicidal ideas. The patient is nervous/anxious.     Objective:  BP 118/72 (BP Location: Left Arm, Patient Position: Sitting, Cuff Size: Normal)   Pulse 80   Temp 98.2 F (36.8 C) (Oral)   Ht 5\' 10"  (1.778 m)   Wt 196 lb (88.9 kg)   SpO2 97%   BMI 28.12 kg/m   BP Readings from Last 3 Encounters:  10/17/22 118/72  09/11/22 117/75  05/28/22 (!) 119/59    Wt Readings from Last 3 Encounters:  10/17/22 196 lb (88.9 kg)  09/11/22 192 lb (87.1 kg)  05/28/22 199 lb 9.6 oz (90.5 kg)    Physical Exam Constitutional:      General: She is not in acute distress.    Appearance: She is well-developed. She is obese.  HENT:     Head: Normocephalic.     Right Ear: External ear normal.     Left Ear: External ear normal.     Nose: Nose  normal.  Eyes:     General:        Right eye: No discharge.        Left eye: No discharge.     Conjunctiva/sclera: Conjunctivae normal.     Pupils: Pupils are equal, round, and reactive to light.  Neck:     Thyroid: No thyromegaly.     Vascular: No JVD.     Trachea: No tracheal deviation.  Cardiovascular:     Rate and Rhythm: Normal rate and regular rhythm.     Heart sounds: Normal heart sounds.  Pulmonary:     Effort: No respiratory distress.     Breath sounds: No stridor. No wheezing.  Abdominal:     General: Bowel sounds are normal. There is no distension.     Palpations: Abdomen is soft. There is no mass.     Tenderness: There is no abdominal tenderness. There is no guarding or rebound.  Musculoskeletal:        General: Tenderness present.     Cervical back:  Normal range of motion and neck supple. No rigidity.  Lymphadenopathy:     Cervical: No cervical adenopathy.  Skin:    Findings: No erythema or rash.  Neurological:     Cranial Nerves: No cranial nerve deficit.     Motor: No abnormal muscle tone.     Coordination: Coordination normal.     Deep Tendon Reflexes: Reflexes normal.  Psychiatric:        Behavior: Behavior normal.        Thought Content: Thought content normal.        Judgment: Judgment normal.     Lab Results  Component Value Date   WBC 7.5 04/11/2022   HGB 13.2 04/11/2022   HCT 39.1 04/11/2022   PLT 249.0 04/11/2022   GLUCOSE 74 10/17/2022   CHOL 146 04/11/2022   TRIG 84.0 04/11/2022   HDL 50.10 04/11/2022   LDLDIRECT 164.6 01/05/2008   LDLCALC 80 04/11/2022   ALT 17 10/17/2022   AST 16 10/17/2022   NA 138 10/17/2022   K 4.6 10/17/2022   CL 101 10/17/2022   CREATININE 0.91 10/17/2022   BUN 14 10/17/2022   CO2 31 10/17/2022   TSH 0.83 04/11/2022   INR 0.9 08/03/2019   HGBA1C 5.5 08/02/2021    MM DIAG BREAST TOMO UNI LEFT  Addendum Date: 08/23/2021   ADDENDUM REPORT: 08/23/2021 10:41 ADDENDUM: The 1st sentence of the findings section should read as follows: 3D tomographic and 2D degenerated spot-compression images of the left breast confirm a 4 mm rounded, circumscribed mass in the outer retroareolar left breast. Electronically Signed   By: Beckie Salts M.D.   On: 08/23/2021 10:41   Result Date: 08/23/2021 CLINICAL DATA:  Possible mass in the anterior outer left breast on a recent screening mammogram. EXAM: DIGITAL DIAGNOSTIC UNILATERAL LEFT MAMMOGRAM WITH TOMOSYNTHESIS AND CAD; ULTRASOUND LEFT BREAST LIMITED TECHNIQUE: Left digital diagnostic mammography and breast tomosynthesis was performed. The images were evaluated with computer-aided detection.; Targeted ultrasound examination of the left breast was performed. COMPARISON:  Previous exam(s). ACR Breast Density Category b: There are scattered areas of  fibroglandular density. FINDINGS: 3D tomographic and 2D generated spot compression images of the right breast confirm a 4 mm rounded, circumscribed mass in the outer retroareolar left breast. Targeted ultrasound is performed, showing a 4 mm cyst in the 3 o'clock position of the left breast, 2 cm from the nipple, corresponding to the mammographic mass. IMPRESSION: 4 mm benign left  breast cyst.  No evidence malignancy. RECOMMENDATION: Bilateral screening mammogram in 11 months when due. I have discussed the findings and recommendations with the patient. If applicable, a reminder letter will be sent to the patient regarding the next appointment. BI-RADS CATEGORY  2: Benign. Electronically Signed: By: Beckie Salts M.D. On: 08/21/2021 15:28  US BREAST LTD UNI LEFT INC AXILLA  Addendum Date: 08/23/2021   ADDENDUM REPORT: 08/23/2021 10:41 ADDENDUM: The 1st sentence of the findings section should read as follows: 3D tomographic and 2D degenerated spot-compression images of the left breast confirm a 4 mm rounded, circumscribed mass in the outer retroareolar left breast. Electronically Signed   By: Beckie Salts M.D.   On: 08/23/2021 10:41   Result Date: 08/23/2021 CLINICAL DATA:  Possible mass in the anterior outer left breast on a recent screening mammogram. EXAM: DIGITAL DIAGNOSTIC UNILATERAL LEFT MAMMOGRAM WITH TOMOSYNTHESIS AND CAD; ULTRASOUND LEFT BREAST LIMITED TECHNIQUE: Left digital diagnostic mammography and breast tomosynthesis was performed. The images were evaluated with computer-aided detection.; Targeted ultrasound examination of the left breast was performed. COMPARISON:  Previous exam(s). ACR Breast Density Category b: There are scattered areas of fibroglandular density. FINDINGS: 3D tomographic and 2D generated spot compression images of the right breast confirm a 4 mm rounded, circumscribed mass in the outer retroareolar left breast. Targeted ultrasound is performed, showing a 4 mm cyst in the 3  o'clock position of the left breast, 2 cm from the nipple, corresponding to the mammographic mass. IMPRESSION: 4 mm benign left breast cyst.  No evidence malignancy. RECOMMENDATION: Bilateral screening mammogram in 11 months when due. I have discussed the findings and recommendations with the patient. If applicable, a reminder letter will be sent to the patient regarding the next appointment. BI-RADS CATEGORY  2: Benign. Electronically Signed: By: Beckie Salts M.D. On: 08/21/2021 15:28   Assessment & Plan:   Problem List Items Addressed This Visit       Cardiovascular and Mediastinum   Essential hypertension - Primary    Cont on Irbesartan; Bumex stopped Reduce Irbesartan if low BP Pt lost 52 lbs on Wegovy      Relevant Orders   Comprehensive metabolic panel (Completed)     Other   Anxiety    Cont on Alprazolam prn  Potential benefits of a long term benzodiazepines  use as well as potential risks  and complications were explained to the patient and were aknowledged.      Dyslipidemia    On Crestor      Low back pain    Spinal stenosis Aleve prn, Salonpas      Obesity, Class I, BMI 30-34.9    Pt lost 52 lbs on Wegovy         No orders of the defined types were placed in this encounter.     Follow-up: Return in about 6 months (around 04/18/2023) for Wellness Exam.  Sonda Primes, MD

## 2022-10-17 NOTE — Assessment & Plan Note (Signed)
Spinal stenosis Aleve prn, Salonpas

## 2022-10-17 NOTE — Assessment & Plan Note (Signed)
On Crestor 

## 2022-10-19 ENCOUNTER — Telehealth: Payer: Self-pay

## 2022-10-19 NOTE — Telephone Encounter (Signed)
Pt has stated she is needing a rx refill for her    ALPRAZolam (XANAX) 0.5 MG tablet  and is wanting it to be sent to the [facility] on file.

## 2022-10-22 MED ORDER — ALPRAZOLAM 0.5 MG PO TABS: ORAL_TABLET | ORAL | 1 refills | Status: DC

## 2022-10-22 NOTE — Telephone Encounter (Signed)
Done

## 2022-10-22 NOTE — Addendum Note (Signed)
Addended by: Tresa Garter on: 10/22/2022 10:36 AM   Modules accepted: Orders

## 2022-10-22 NOTE — Telephone Encounter (Signed)
Pt came in office with her mother during her ov and was inquiring about her prescription getting refilled for  ALPRAZolam Prudy Feeler) 0.5 MG tablet

## 2022-11-28 DIAGNOSIS — M25551 Pain in right hip: Secondary | ICD-10-CM | POA: Diagnosis not present

## 2023-03-15 ENCOUNTER — Encounter (HOSPITAL_BASED_OUTPATIENT_CLINIC_OR_DEPARTMENT_OTHER): Payer: Self-pay | Admitting: Cardiology

## 2023-03-15 ENCOUNTER — Ambulatory Visit (HOSPITAL_BASED_OUTPATIENT_CLINIC_OR_DEPARTMENT_OTHER): Payer: BC Managed Care – PPO | Admitting: Cardiology

## 2023-03-15 VITALS — BP 128/82 | HR 98 | Ht 70.0 in | Wt 206.5 lb

## 2023-03-15 DIAGNOSIS — Z7189 Other specified counseling: Secondary | ICD-10-CM | POA: Diagnosis not present

## 2023-03-15 DIAGNOSIS — Z8639 Personal history of other endocrine, nutritional and metabolic disease: Secondary | ICD-10-CM

## 2023-03-15 DIAGNOSIS — I10 Essential (primary) hypertension: Secondary | ICD-10-CM

## 2023-03-15 DIAGNOSIS — Z6829 Body mass index (BMI) 29.0-29.9, adult: Secondary | ICD-10-CM

## 2023-03-15 DIAGNOSIS — I700 Atherosclerosis of aorta: Secondary | ICD-10-CM | POA: Diagnosis not present

## 2023-03-15 DIAGNOSIS — E7800 Pure hypercholesterolemia, unspecified: Secondary | ICD-10-CM

## 2023-03-15 MED ORDER — SEMAGLUTIDE-WEIGHT MANAGEMENT 2.4 MG/0.75ML ~~LOC~~ SOAJ: 2.4000 mg | SUBCUTANEOUS | 11 refills | Status: DC

## 2023-03-15 NOTE — Patient Instructions (Signed)
Medication Instructions:  Your physician recommends that you continue on your current medications as directed. Please refer to the Current Medication list given to you today.  *If you need a refill on your cardiac medications before your next appointment, please call your pharmacy*  Lab Work: NONE  Testing/Procedures: NONE  Follow-Up: At [facility], you and your health needs are our priority.  As part of our continuing mission to provide you with exceptional heart care, we have created designated Provider Care Teams.  These Care Teams include your primary Cardiologist (physician) and Advanced Practice Providers (APPs -  Physician Assistants and Nurse Practitioners) who all work together to provide you with the care you need, when you need it.  We recommend signing up for the patient portal called "MyChart".  Sign up information is provided on this After Visit Summary.  MyChart is used to connect with patients for Virtual Visits (Telemedicine).  Patients are able to view lab/test results, encounter notes, upcoming appointments, etc.  Non-urgent messages can be sent to your provider as well.   To learn more about what you can do with MyChart, go to [email or web address].    Your next appointment:   6 month(s)  The format for your next appointment:   In Person  Provider:   Jodelle Red, MD or Ronn Melena NP

## 2023-03-15 NOTE — Progress Notes (Signed)
Cardiology Office Note:  .    Date:  03/15/2023  ID:  Courtney Nielsen, DOB 28-Jul-1955, MRN [id number] PCP: Tresa Garter, MD  [facility] Providers Cardiologist:  Jodelle Red, MD     History of Present Illness: .    Courtney Nielsen is a 67 y.o. female with a hx of obesity, COPD, hypertension who is seen for follow up. I initially saw her 12/26/18 as a new consult at the request of Plotnikov, Georgina Quint, MD for the evaluation and management of shortness of breath, edema, and chest pain.   Cardiac history: Echocardiogram 12/24/18 showed EF 55-60%, grade 1 diastolic dysfunction. No significant valve disease. Labs showed Cr 1.03, nl TSH, largely nl CBC (borderline eosinophils), normal LFTs, normal D dimer, normal BNP of 12.   In 12/2021, she had lost 40 lbs on Wegovy and had improved her diet. She was only limited by chronic back pain. Her blood pressure had been improving and her BLE edema was resolved.   At her visit 08/2022, she was feeling well. Her BMI has lowered from 35 to 27.5 with semaglutide. We discussed activity and diet in conjunction with Wegovy and weight loss.  Today, she is feeling well aside from allergy type symptoms possibly due to ragweed. She believes that her allergy medications may be less effective than they were previously.  Lately she has not been able to walk as much for exercise because of the hot weather. She hopes to return to her routines this Fall.  She continues to tolerate 2.4 mg Semaglutide. Current weight 206 lb.  She denies any palpitations, chest pain, shortness of breath, peripheral edema, lightheadedness, headaches, syncope, orthopnea, or PND.  ROS:  Please see the history of present illness. ROS otherwise negative except as noted.  (+) Environmental allergies  Studies Reviewed: Marland Kitchen    EKG Interpretation Date/Time:  Friday March 15 2023 08:40:53 EDT Ventricular Rate:  98 PR Interval:  160 QRS Duration:  86 QT  Interval:  322 QTC Calculation: 411 R Axis:   37  Text Interpretation: Normal sinus rhythm Normal ECG When compared with ECG of 22-Nov-2016 15:35, Premature ventricular complexes are no longer Present Confirmed by Jodelle Red [id number]) on 03/15/2023 8:56:43 AM      Physical Exam:    VS:  BP 128/82   Pulse 98   Ht 5\' 10"  (1.778 m)   Wt 206 lb 8 oz (93.7 kg)   SpO2 97%   BMI 29.63 kg/m    Wt Readings from Last 3 Encounters:  03/15/23 206 lb 8 oz (93.7 kg)  10/17/22 196 lb (88.9 kg)  09/11/22 192 lb (87.1 kg)    GEN: Well nourished, well developed in no acute distress HEENT: Normal, moist mucous membranes NECK: No JVD CARDIAC: regular rhythm, normal S1 and S2, no rubs or gallops. No murmur. VASCULAR: Radial and DP pulses 2+ bilaterally. No carotid bruits RESPIRATORY:  Clear to auscultation without rales, wheezing or rhonchi  ABDOMEN: Soft, non-tender, non-distended MUSCULOSKELETAL:  Ambulates independently SKIN: Warm and dry, no edema NEUROLOGIC:  Alert and oriented x 3. No focal neuro deficits noted. PSYCHIATRIC:  Normal affect   ASSESSMENT AND PLAN: .    Hypertension: goal <130/80.  -on irbesartan, continue. Can consider cutting back in the future if BP/weight continues to drop   Obesity: BMI 35 with CV risk factors-->BMI 29 today. Starting weight 246 lbs. -doing very well on Wegovy 2.4 mg weekly. -height is 5 ft 10.5. Goal weight 180 lbs per  patient. A weight of 175 would put her BMI under 25. -shortness of breath, LE edema totally resolved with weight loss -continue current dose of Wegovy   Hypercholesterolemia -continue rosuvastatin -on aspirin per personal preference   Cardiac risk counseling and prevention recommendations: -recommend heart healthy/Mediterranean diet, with whole grains, fruits, vegetable, fish, lean meats, nuts, and olive oil. Limit salt. -recommend moderate walking, 3-5 times/week for 30-50 minutes each session. Aim for at least 150  minutes.week. Goal should be pace of 3 miles/hours, or walking 1.5 miles in 30 minutes -recommend avoidance of tobacco products. Avoid excess alcohol. -ASCVD risk score: The 10-year ASCVD risk score (Arnett DK, et al., 2019) is: 7.4%   Values used to calculate the score:     Age: 54 years     Sex: Female     Is Non-Hispanic African American: No     Diabetic: No     Tobacco smoker: No     Systolic Blood Pressure: 128 mmHg     Is BP treated: Yes     HDL Cholesterol: 50.1 mg/dL     Total Cholesterol: 146 mg/dL   Dispo: Follow-up in 6 months, or sooner as needed.  I,Mathew Stumpf,acting as a Neurosurgeon for [employer], MD.,have documented all relevant documentation on the behalf of Jodelle Red, MD,as directed by  Jodelle Red, MD while in the presence of Jodelle Red, MD.  I, Jodelle Red, MD, have reviewed all documentation for this visit. The documentation on 03/15/23 for the exam, diagnosis, procedures, and orders are all accurate and complete.   Signed, Jodelle Red, MD

## 2023-03-25 ENCOUNTER — Other Ambulatory Visit: Payer: Self-pay | Admitting: Internal Medicine

## 2023-03-25 DIAGNOSIS — Z0000 Encounter for general adult medical examination without abnormal findings: Secondary | ICD-10-CM

## 2023-04-16 ENCOUNTER — Other Ambulatory Visit (INDEPENDENT_AMBULATORY_CARE_PROVIDER_SITE_OTHER): Payer: BC Managed Care – PPO

## 2023-04-16 DIAGNOSIS — Z0000 Encounter for general adult medical examination without abnormal findings: Secondary | ICD-10-CM | POA: Diagnosis not present

## 2023-04-16 LAB — COMPREHENSIVE METABOLIC PANEL
ALT: 17 U/L (ref 0–35)
AST: 18 U/L (ref 0–37)
Albumin: 3.9 g/dL (ref 3.5–5.2)
Alkaline Phosphatase: 71 U/L (ref 39–117)
BUN: 7 mg/dL (ref 6–23)
CO2: 28 meq/L (ref 19–32)
Calcium: 9.3 mg/dL (ref 8.4–10.5)
Chloride: 103 meq/L (ref 96–112)
Creatinine, Ser: 0.94 mg/dL (ref 0.40–1.20)
GFR: 62.98 mL/min (ref >60.00)
Glucose, Bld: 91 mg/dL (ref 70–99)
Potassium: 3.8 meq/L (ref 3.5–5.1)
Sodium: 139 meq/L (ref 135–145)
Total Bilirubin: 1.7 mg/dL — ABNORMAL HIGH (ref 0.2–1.2)
Total Protein: 6.5 g/dL (ref 6.0–8.3)

## 2023-04-16 LAB — URINALYSIS
Bilirubin Urine: NEGATIVE
Hgb urine dipstick: NEGATIVE
Ketones, ur: NEGATIVE
Leukocytes,Ua: NEGATIVE
Nitrite: NEGATIVE
Specific Gravity, Urine: 1.01 (ref 1.000–1.030)
Total Protein, Urine: NEGATIVE
Urine Glucose: NEGATIVE
Urobilinogen, UA: 0.2 (ref 0.0–1.0)
pH: 6 (ref 5.0–8.0)

## 2023-04-16 LAB — CBC WITH DIFFERENTIAL/PLATELET
Basophils Absolute: 0.1 10*3/uL (ref 0.0–0.1)
Basophils Relative: 0.9 % (ref 0.0–3.0)
Eosinophils Absolute: 0.4 10*3/uL (ref 0.0–0.7)
Eosinophils Relative: 4.3 % (ref 0.0–5.0)
HCT: 41.6 % (ref 36.0–46.0)
Hemoglobin: 13.6 g/dL (ref 12.0–15.0)
Lymphocytes Relative: 34.9 % (ref 12.0–46.0)
Lymphs Abs: 2.9 10*3/uL (ref 0.7–4.0)
MCHC: 32.8 g/dL (ref 30.0–36.0)
MCV: 95 fL (ref 78.0–100.0)
Monocytes Absolute: 0.6 10*3/uL (ref 0.1–1.0)
Monocytes Relative: 7.8 % (ref 3.0–12.0)
Neutro Abs: 4.3 10*3/uL (ref 1.4–7.7)
Neutrophils Relative %: 52.1 % (ref 43.0–77.0)
Platelets: 245 10*3/uL (ref 150.0–400.0)
RBC: 4.38 Mil/uL (ref 3.87–5.11)
RDW: 13 % (ref 11.5–15.5)
WBC: 8.2 10*3/uL (ref 4.0–10.5)

## 2023-04-16 LAB — LIPID PANEL
Cholesterol: 110 mg/dL (ref 0–200)
HDL: 45.6 mg/dL (ref >39.00)
LDL Cholesterol: 48 mg/dL (ref 0–99)
NonHDL: 64.23
Total CHOL/HDL Ratio: 2
Triglycerides: 80 mg/dL (ref 0.0–149.0)
VLDL: 16 mg/dL (ref 0.0–40.0)

## 2023-04-16 LAB — TSH: TSH: 0.85 u[IU]/mL (ref 0.35–5.50)

## 2023-04-19 ENCOUNTER — Other Ambulatory Visit: Payer: Self-pay | Admitting: Internal Medicine

## 2023-04-24 ENCOUNTER — Ambulatory Visit: Payer: BC Managed Care – PPO | Admitting: Internal Medicine

## 2023-04-24 ENCOUNTER — Encounter: Payer: Self-pay | Admitting: Internal Medicine

## 2023-04-24 VITALS — BP 110/76 | HR 76 | Temp 97.9°F | Ht 70.0 in | Wt 204.0 lb

## 2023-04-24 DIAGNOSIS — M545 Low back pain, unspecified: Secondary | ICD-10-CM | POA: Diagnosis not present

## 2023-04-24 DIAGNOSIS — Z0000 Encounter for general adult medical examination without abnormal findings: Secondary | ICD-10-CM | POA: Diagnosis not present

## 2023-04-24 DIAGNOSIS — E538 Deficiency of other specified B group vitamins: Secondary | ICD-10-CM

## 2023-04-24 DIAGNOSIS — Z23 Encounter for immunization: Secondary | ICD-10-CM | POA: Diagnosis not present

## 2023-04-24 DIAGNOSIS — I10 Essential (primary) hypertension: Secondary | ICD-10-CM

## 2023-04-24 DIAGNOSIS — R17 Unspecified jaundice: Secondary | ICD-10-CM

## 2023-04-24 DIAGNOSIS — E785 Hyperlipidemia, unspecified: Secondary | ICD-10-CM

## 2023-04-24 MED ORDER — ALPRAZOLAM 0.5 MG PO TABS: ORAL_TABLET | ORAL | 1 refills | Status: DC

## 2023-04-24 NOTE — Assessment & Plan Note (Signed)
Likely due to meds Check RUQ [place]

## 2023-04-24 NOTE — Assessment & Plan Note (Addendum)
Spinal stenosis Aleve prn, Salonpas, Tylenol prn

## 2023-04-24 NOTE — Progress Notes (Signed)
Subjective:  Patient ID: Courtney Nielsen, female    DOB: 04/29/1956  Age: 67 y.o. MRN: [id number]  CC: Follow-up (6 MNTH F/U)   HPI Courtney Nielsen presents for elevated bilirubin, anxiety, dyslipidemia Well exam  Outpatient Medications Prior to Visit  Medication Sig Dispense Refill   acetaminophen (TYLENOL) 500 MG tablet Take 1,000 mg by mouth every 6 (six) hours as needed for moderate pain.      Ascorbic Acid (VITAMIN C) 1000 MG tablet Take 1 tablet by mouth daily.     aspirin EC 81 MG tablet Take 81 mg by mouth at bedtime.      B Complex-C (SUPER B COMPLEX/VITAMIN C PO) Take 1 tablet by mouth daily.     Cholecalciferol (VITAMIN D3) 50 MCG (2000 UT) TABS Take 2,000 Units by mouth at bedtime.     irbesartan (AVAPRO) 300 MG tablet TAKE 1 TABLET AT BEDTIME 90 tablet 3   loratadine (CLARITIN) 10 MG tablet Take 10 mg by mouth at bedtime.     Menthol, Topical Analgesic, (BLUE-EMU MAXIMUM STRENGTH EX) Apply 1 application topically daily.     naproxen sodium (ALEVE) 220 MG tablet Take 220 mg by mouth daily as needed.     Omega-3 Fatty Acids (FISH OIL) 500 MG CAPS Take 500 mg by mouth at bedtime.      rosuvastatin (CRESTOR) 5 MG tablet TAKE 1 TABLET BY MOUTH DAILY 30 tablet 3   Semaglutide-Weight Management 2.4 MG/0.75ML SOAJ Inject 2.4 mg into the skin once a week. 3 mL 11   vitamin B-12 (CYANOCOBALAMIN) 1000 MCG tablet Take 1,000 mcg by mouth at bedtime.     ALPRAZolam (XANAX) 0.5 MG tablet TAKE (1) TABLET BY MOUTH AT BEDTIME. 90 tablet 1   No facility-administered medications prior to visit.    ROS: Review of Systems  Constitutional:  Negative for activity change, appetite change, chills, fatigue and unexpected weight change.  HENT:  Negative for congestion, mouth sores and sinus pressure.   Eyes:  Negative for visual disturbance.  Respiratory:  Negative for cough and chest tightness.   Gastrointestinal:  Negative for abdominal pain and nausea.  Genitourinary:  Negative for  difficulty urinating, frequency and vaginal pain.  Musculoskeletal:  Negative for back pain and gait problem.  Skin:  Negative for pallor and rash.  Neurological:  Negative for dizziness, tremors, weakness, numbness and headaches.  Psychiatric/Behavioral:  Negative for confusion, sleep disturbance and suicidal ideas.     Objective:  BP 110/76 (BP Location: Left Arm, Patient Position: Sitting, Cuff Size: Normal)   Pulse 76   Temp 97.9 F (36.6 C) (Oral)   Ht 5\' 10"  (1.778 m)   Wt 204 lb (92.5 kg)   SpO2 96%   BMI 29.27 kg/m   BP Readings from Last 3 Encounters:  04/24/23 110/76  03/15/23 128/82  10/17/22 118/72    Wt Readings from Last 3 Encounters:  04/24/23 204 lb (92.5 kg)  03/15/23 206 lb 8 oz (93.7 kg)  10/17/22 196 lb (88.9 kg)    Physical Exam Constitutional:      General: She is not in acute distress.    Appearance: She is well-developed. She is obese.  HENT:     Head: Normocephalic.     Right Ear: External ear normal.     Left Ear: External ear normal.     Nose: Nose normal.  Eyes:     General:        Right eye: No discharge.  Left eye: No discharge.     Conjunctiva/sclera: Conjunctivae normal.     Pupils: Pupils are equal, round, and reactive to light.  Neck:     Thyroid: No thyromegaly.     Vascular: No JVD.     Trachea: No tracheal deviation.  Cardiovascular:     Rate and Rhythm: Normal rate and regular rhythm.     Heart sounds: Normal heart sounds.  Pulmonary:     Effort: No respiratory distress.     Breath sounds: No stridor. No wheezing.  Abdominal:     General: Bowel sounds are normal. There is no distension.     Palpations: Abdomen is soft. There is no mass.     Tenderness: There is no abdominal tenderness. There is no guarding or rebound.  Musculoskeletal:        General: No tenderness.     Cervical back: Normal range of motion and neck supple. No rigidity.  Lymphadenopathy:     Cervical: No cervical adenopathy.  Skin:     Findings: No erythema or rash.  Neurological:     Cranial Nerves: No cranial nerve deficit.     Motor: No abnormal muscle tone.     Coordination: Coordination normal.     Deep Tendon Reflexes: Reflexes normal.  Psychiatric:        Behavior: Behavior normal.        Thought Content: Thought content normal.        Judgment: Judgment normal.     Lab Results  Component Value Date   WBC 8.2 04/16/2023   HGB 13.6 04/16/2023   HCT 41.6 04/16/2023   PLT 245.0 04/16/2023   GLUCOSE 91 04/16/2023   CHOL 110 04/16/2023   TRIG 80.0 04/16/2023   HDL 45.60 04/16/2023   LDLDIRECT 164.6 01/05/2008   LDLCALC 48 04/16/2023   ALT 17 04/16/2023   AST 18 04/16/2023   NA 139 04/16/2023   K 3.8 04/16/2023   CL 103 04/16/2023   CREATININE 0.94 04/16/2023   BUN 7 04/16/2023   CO2 28 04/16/2023   TSH 0.85 04/16/2023   INR 0.9 08/03/2019   HGBA1C 5.5 08/02/2021    MM DIAG BREAST TOMO UNI LEFT  Addendum Date: 08/23/2021   ADDENDUM REPORT: 08/23/2021 10:41 ADDENDUM: The 1st sentence of the findings section should read as follows: 3D tomographic and 2D degenerated spot-compression images of the left breast confirm a 4 mm rounded, circumscribed mass in the outer retroareolar left breast. Electronically Signed   By: Beckie Salts M.D.   On: 08/23/2021 10:41   Result Date: 08/23/2021 CLINICAL DATA:  Possible mass in the anterior outer left breast on a recent screening mammogram. EXAM: DIGITAL DIAGNOSTIC UNILATERAL LEFT MAMMOGRAM WITH TOMOSYNTHESIS AND CAD; ULTRASOUND LEFT BREAST LIMITED TECHNIQUE: Left digital diagnostic mammography and breast tomosynthesis was performed. The images were evaluated with computer-aided detection.; Targeted ultrasound examination of the left breast was performed. COMPARISON:  Previous exam(s). ACR Breast Density Category b: There are scattered areas of fibroglandular density. FINDINGS: 3D tomographic and 2D generated spot compression images of the right breast confirm a 4 mm  rounded, circumscribed mass in the outer retroareolar left breast. Targeted ultrasound is performed, showing a 4 mm cyst in the 3 o'clock position of the left breast, 2 cm from the nipple, corresponding to the mammographic mass. IMPRESSION: 4 mm benign left breast cyst.  No evidence malignancy. RECOMMENDATION: Bilateral screening mammogram in 11 months when due. I have discussed the findings and recommendations with the patient. If  applicable, a reminder letter will be sent to the patient regarding the next appointment. BI-RADS CATEGORY  2: Benign. Electronically Signed: By: Beckie Salts M.D. On: 08/21/2021 15:28  US BREAST LTD UNI LEFT INC AXILLA  Addendum Date: 08/23/2021   ADDENDUM REPORT: 08/23/2021 10:41 ADDENDUM: The 1st sentence of the findings section should read as follows: 3D tomographic and 2D degenerated spot-compression images of the left breast confirm a 4 mm rounded, circumscribed mass in the outer retroareolar left breast. Electronically Signed   By: Beckie Salts M.D.   On: 08/23/2021 10:41   Result Date: 08/23/2021 CLINICAL DATA:  Possible mass in the anterior outer left breast on a recent screening mammogram. EXAM: DIGITAL DIAGNOSTIC UNILATERAL LEFT MAMMOGRAM WITH TOMOSYNTHESIS AND CAD; ULTRASOUND LEFT BREAST LIMITED TECHNIQUE: Left digital diagnostic mammography and breast tomosynthesis was performed. The images were evaluated with computer-aided detection.; Targeted ultrasound examination of the left breast was performed. COMPARISON:  Previous exam(s). ACR Breast Density Category b: There are scattered areas of fibroglandular density. FINDINGS: 3D tomographic and 2D generated spot compression images of the right breast confirm a 4 mm rounded, circumscribed mass in the outer retroareolar left breast. Targeted ultrasound is performed, showing a 4 mm cyst in the 3 o'clock position of the left breast, 2 cm from the nipple, corresponding to the mammographic mass. IMPRESSION: 4 mm benign left  breast cyst.  No evidence malignancy. RECOMMENDATION: Bilateral screening mammogram in 11 months when due. I have discussed the findings and recommendations with the patient. If applicable, a reminder letter will be sent to the patient regarding the next appointment. BI-RADS CATEGORY  2: Benign. Electronically Signed: By: Beckie Salts M.D. On: 08/21/2021 15:28   Assessment & Plan:   Problem List Items Addressed This Visit     RESOLVED: B12 deficiency    On B12      Essential hypertension - Primary    Cont on Irbesartan; Bumex stopped Reduce Irbesartan if low BP Pt lost 52 lbs on Wegovy      Relevant Orders   US Abdomen Limited RUQ (LIVER/GB)   Low back pain    Spinal stenosis Aleve prn, Salonpas, Tylenol prn      Well adult exam    We discussed age appropriate health related issues, including available/recomended screening tests and vaccinations. Labs were ordered to be later reviewed . All questions were answered. We discussed one or more of the following - seat belt use, use of sunscreen/sun exposure exercise, fall risk reduction, second hand smoke exposure, firearm use and storage, seat belt use, a need for adhering to healthy diet and exercise. Labs were ordered.  All questions were answered. CT angio March 2019 - no CAD, PE Colon 2018, 2023 Dr Russella Dar       Dyslipidemia   Relevant Orders   Lipid panel   Elevated bilirubin    Likely due to meds Check RUQ [place]      Relevant Orders   Comprehensive metabolic panel   Other Visit Diagnoses     Need for influenza vaccination       Relevant Orders   Flu Vaccine Trivalent High Dose (Fluad) (Completed)         Meds ordered this encounter  Medications   ALPRAZolam (XANAX) 0.5 MG tablet    Sig: TAKE (1) TABLET BY MOUTH AT BEDTIME.    Dispense:  90 tablet    Refill:  1      Follow-up: Return in about 6 months (around 10/23/2023) for a follow-up  visit.  Sonda Primes, MD

## 2023-04-24 NOTE — Assessment & Plan Note (Signed)
We discussed age appropriate health related issues, including available/recomended screening tests and vaccinations. Labs were ordered to be later reviewed . All questions were answered. We discussed one or more of the following - seat belt use, use of sunscreen/sun exposure exercise, fall risk reduction, second hand smoke exposure, firearm use and storage, seat belt use, a need for adhering to healthy diet and exercise. Labs were ordered.  All questions were answered. CT angio March 2019 - no CAD, PE Colon 2018, 2023 Dr Russella Dar

## 2023-04-24 NOTE — Assessment & Plan Note (Signed)
On B12 

## 2023-04-24 NOTE — Assessment & Plan Note (Signed)
Cont on Irbesartan; Bumex stopped Reduce Irbesartan if low BP Pt lost 52 lbs on Wegovy

## 2023-05-01 ENCOUNTER — Ambulatory Visit
Admission: RE | Admit: 2023-05-01 | Discharge: 2023-05-01 | Disposition: A | Discharge status: Home / Self Care | Payer: BC Managed Care – PPO | Source: Ambulatory Visit | Attending: Internal Medicine | Admitting: Internal Medicine

## 2023-05-01 DIAGNOSIS — I10 Essential (primary) hypertension: Secondary | ICD-10-CM

## 2023-05-01 DIAGNOSIS — R7989 Other specified abnormal findings of blood chemistry: Secondary | ICD-10-CM | POA: Diagnosis not present

## 2023-05-09 ENCOUNTER — Other Ambulatory Visit (HOSPITAL_COMMUNITY): Payer: Self-pay

## 2023-05-09 ENCOUNTER — Telehealth: Payer: Self-pay | Admitting: Cardiology

## 2023-05-09 NOTE — Telephone Encounter (Signed)
Per test claim, prescription was processed on 10/28 so is soon to fill. Patient will also need to begin filling at mail order pharmacy.

## 2023-05-09 NOTE — Telephone Encounter (Signed)
Pt c/o medication issue:  1. Name of Medication:   Semaglutide-Weight Management 2.4 MG/0.75ML SOAJ    2. How are you currently taking this medication (dosage and times per day)?   3. Are you having a reaction (difficulty breathing--STAT)? no  4. What is your medication issue? Patient states the medication is no longer cover under her insurance. Please advise

## 2023-05-09 NOTE — Telephone Encounter (Signed)
Patient brought in paperwork. Her plan will not cover weight loss medications past March 2025. Prior authorizations will not be able to fix that. Patient will need to pay cash price or sign up for new insurance plan.

## 2023-05-09 NOTE — Telephone Encounter (Signed)
Spoke to patient and message relayed. Patient stated she received a letter from [facility] stating they will no longer cover the medication. She will bring the letter to office for review.

## 2023-05-15 ENCOUNTER — Telehealth (HOSPITAL_BASED_OUTPATIENT_CLINIC_OR_DEPARTMENT_OTHER): Payer: Self-pay | Admitting: Cardiology

## 2023-05-15 NOTE — Telephone Encounter (Signed)
Tried calling patient  Courtney Nielsen D reviewed in previous phone note  [name], [facility]   05/09/23  3:59 PM Note Patient brought in paperwork. Her plan will not cover weight loss medications past March 2025. Prior authorizations will not be able to fix that. Patient will need to pay cash price or sign up for new insurance plan.     Mychart message to patient advising and will forward to Dr Cristal Deer for review

## 2023-05-15 NOTE — Telephone Encounter (Signed)
Patient stopped by to drop off paperwork re her insurance denying coverage for one of her medications in 2025. Patient requests a call from someone on the team to discuss options. Put paperwork in Dr. Di Kindle box.

## 2023-05-17 NOTE — Telephone Encounter (Signed)
Spoke with patient, aware of appointment date and time

## 2023-05-17 NOTE — Telephone Encounter (Signed)
Dr Cristal Deer reviewed and would like to see patient in January  Mychart message to patient

## 2023-05-30 ENCOUNTER — Other Ambulatory Visit (HOSPITAL_BASED_OUTPATIENT_CLINIC_OR_DEPARTMENT_OTHER): Payer: Self-pay | Admitting: Cardiology

## 2023-05-30 ENCOUNTER — Other Ambulatory Visit: Payer: Self-pay | Admitting: Internal Medicine

## 2023-05-30 DIAGNOSIS — Z8639 Personal history of other endocrine, nutritional and metabolic disease: Secondary | ICD-10-CM

## 2023-05-30 DIAGNOSIS — I700 Atherosclerosis of aorta: Secondary | ICD-10-CM

## 2023-05-30 DIAGNOSIS — I10 Essential (primary) hypertension: Secondary | ICD-10-CM

## 2023-07-09 ENCOUNTER — Encounter (HOSPITAL_BASED_OUTPATIENT_CLINIC_OR_DEPARTMENT_OTHER): Payer: Self-pay | Admitting: Cardiology

## 2023-07-09 ENCOUNTER — Other Ambulatory Visit (HOSPITAL_BASED_OUTPATIENT_CLINIC_OR_DEPARTMENT_OTHER): Payer: Self-pay

## 2023-07-09 ENCOUNTER — Ambulatory Visit (HOSPITAL_BASED_OUTPATIENT_CLINIC_OR_DEPARTMENT_OTHER): Payer: BC Managed Care – PPO | Admitting: Cardiology

## 2023-07-09 VITALS — BP 134/70 | HR 79 | Ht 70.0 in | Wt 205.6 lb

## 2023-07-09 DIAGNOSIS — I10 Essential (primary) hypertension: Secondary | ICD-10-CM

## 2023-07-09 DIAGNOSIS — I700 Atherosclerosis of aorta: Secondary | ICD-10-CM

## 2023-07-09 DIAGNOSIS — E7800 Pure hypercholesterolemia, unspecified: Secondary | ICD-10-CM

## 2023-07-09 DIAGNOSIS — E66811 Obesity, class 1: Secondary | ICD-10-CM | POA: Diagnosis not present

## 2023-07-09 MED ORDER — WEGOVY 1 MG/0.5ML ~~LOC~~ SOAJ
1.0000 mg | SUBCUTANEOUS | 0 refills | Status: DC
  Filled 2023-07-09: qty 2, fill #0
  Filled 2023-08-15: qty 2, 28d supply, fill #0

## 2023-07-09 MED ORDER — WEGOVY 1.7 MG/0.75ML ~~LOC~~ SOAJ
1.7000 mg | SUBCUTANEOUS | 0 refills | Status: DC
  Filled 2023-07-09 – 2023-07-19 (×2): qty 3, 28d supply, fill #0

## 2023-07-09 MED ORDER — WEGOVY 0.5 MG/0.5ML ~~LOC~~ SOAJ
0.5000 mg | SUBCUTANEOUS | 0 refills | Status: DC
  Filled 2023-07-09 – 2023-09-13 (×2): qty 2, 28d supply, fill #0

## 2023-07-09 NOTE — Patient Instructions (Addendum)
 Medication Instructions:  Your physician has recommended you make the following change in your medication:  Change to taking Wegovy  every 10 days to 14 days (if you get a lot of nausea after taking the dose 14 days later, then shorten the interval to 10 days between). - Finish the 2.4 mg doses you have, every 10-14 days - Then take 1.7 mg dose every 10-14 days - Then take 1 mg dose every 10-14 days - Then 0.5 mg dose every 10-14 days - Then stop.  *If you need a refill on your cardiac medications before your next appointment, please call your pharmacy*   Follow-Up: At [facility], you and your health needs are our priority.  As part of our continuing mission to provide you with exceptional heart care, we have created designated Provider Care Teams.  These Care Teams include your primary Cardiologist (physician) and Advanced Practice Providers (APPs -  Physician Assistants and Nurse Practitioners) who all work together to provide you with the care you need, when you need it.  We recommend signing up for the patient portal called MyChart.  Sign up information is provided on this After Visit Summary.  MyChart is used to connect with patients for Virtual Visits (Telemedicine).  Patients are able to view lab/test results, encounter notes, upcoming appointments, etc.  Non-urgent messages can be sent to your provider as well.   To learn more about what you can do with MyChart, go to [email or web address].    Your next appointment:   6 month(s)  Provider:   Shelda Bruckner, MD    Other Instructions If the stomach pain doesn't get better with decreasing the doses, please let me know.

## 2023-07-09 NOTE — Progress Notes (Signed)
 Cardiology Office Note:  .    Date:  07/09/2023  ID:  Courtney VANDERPLOEG, DOB 02/29/1956, MRN [id number] PCP: Courtney Courtney GAILS, MD  [facility] Providers Cardiologist:  Shelda Bruckner, MD     History of Present Illness: .    Courtney Nielsen is a 68 y.o. female with a hx of obesity, COPD, hypertension who is seen for follow up. I initially saw her 12/26/18 as a new consult at the request of Plotnikov, Courtney GAILS, MD for the evaluation and management of shortness of breath, edema, and chest pain.   Cardiac history: Echocardiogram 12/24/18 showed EF 55-60%, grade 1 diastolic dysfunction. No significant valve disease. Labs showed Cr 1.03, nl TSH, largely nl CBC (borderline eosinophils), normal LFTs, normal D dimer, normal BNP of 12.   Has done extremely well with Wegovy . Starting weight 246 lbs in 2022, BMI 35 at that time.  Today: Has been having stomach and back pain recently. Has known back issues but not a candidate for surgery. Has tried nerve blocks in the past, didn't last long and had side effects.  Feels like her stomach stays swollen all the time for the last few months. Weight has been stable, no longer losing weight. Feels bloated. Constant, not related to time of day or food. No melena/hematochezia, no emesis.  Has been under a lot of stress caregiving for her mother.  ROS: Denies chest pain, shortness of breath at rest or with normal exertion. No PND, orthopnea, LE edema or unexpected weight gain. No syncope or palpitations. ROS otherwise negative except as noted.   Studies Reviewed: SABRA    ECG:         Physical Exam:    VS:  BP 134/70   Pulse 79   Ht 5' 10 (1.778 m)   Wt 205 lb 9.6 oz (93.3 kg)   SpO2 98%   BMI 29.50 kg/m    Wt Readings from Last 3 Encounters:  07/09/23 205 lb 9.6 oz (93.3 kg)  04/24/23 204 lb (92.5 kg)  03/15/23 206 lb 8 oz (93.7 kg)    GEN: Well nourished, well developed in no acute distress HEENT: Normal, moist mucous  membranes NECK: No JVD CARDIAC: regular rhythm, normal S1 and S2, no rubs or gallops. No murmur. VASCULAR: Radial and DP pulses 2+ bilaterally. No carotid bruits RESPIRATORY:  Clear to auscultation without rales, wheezing or rhonchi  ABDOMEN: Soft, non-tender, non-distended MUSCULOSKELETAL:  Ambulates independently SKIN: Warm and dry, no edema NEUROLOGIC:  Alert and oriented x 3. No focal neuro deficits noted. PSYCHIATRIC:  Normal affect   ASSESSMENT AND PLAN: .    Hypertension: goal <130/80.  -on irbesartan , continue   Obesity: BMI 35 with CV risk factors-->BMI ~30 today. Starting weight 246 lbs. -having stomach bloating, and also will not have coverage for GLP later this year. Given this, will slowly taper off Wegovy . Instructions for this in AVS. Discussed monitoring for rebound weight gain -height is 5 ft 10.5. Goal weight 180 lbs per patient. A weight of 175 would put her BMI under 25. -shortness of breath, LE edema totally resolved with weight loss -continue current dose of Wegovy    Hypercholesterolemia Aortic atherosclerosis (CT 2019) -continue rosuvastatin  -tolerating aspirin   Cardiac risk counseling and prevention recommendations: -recommend heart healthy/Mediterranean diet, with whole grains, fruits, vegetable, fish, lean meats, nuts, and olive oil. Limit salt. -recommend moderate walking, 3-5 times/week for 30-50 minutes each session. Aim for at least 150 minutes.week. Goal should be pace of  3 miles/hours, or walking 1.5 miles in 30 minutes -recommend avoidance of tobacco products. Avoid excess alcohol. -ASCVD risk score: The ASCVD Risk score (Arnett DK, et al., 2019) failed to calculate for the following reasons:   The valid total cholesterol range is 130 to 320 mg/dL   Dispo: Follow-up in 6 months, or sooner as needed.  Signed, Shelda Bruckner, MD

## 2023-07-19 ENCOUNTER — Other Ambulatory Visit (HOSPITAL_BASED_OUTPATIENT_CLINIC_OR_DEPARTMENT_OTHER): Payer: Self-pay

## 2023-07-25 ENCOUNTER — Other Ambulatory Visit: Payer: Self-pay | Admitting: Internal Medicine

## 2023-08-15 ENCOUNTER — Other Ambulatory Visit (HOSPITAL_BASED_OUTPATIENT_CLINIC_OR_DEPARTMENT_OTHER): Payer: Self-pay

## 2023-09-11 DIAGNOSIS — Z1231 Encounter for screening mammogram for malignant neoplasm of breast: Secondary | ICD-10-CM | POA: Diagnosis not present

## 2023-09-13 ENCOUNTER — Other Ambulatory Visit (HOSPITAL_BASED_OUTPATIENT_CLINIC_OR_DEPARTMENT_OTHER): Payer: Self-pay

## 2023-09-13 ENCOUNTER — Ambulatory Visit (HOSPITAL_BASED_OUTPATIENT_CLINIC_OR_DEPARTMENT_OTHER): Payer: BC Managed Care – PPO | Admitting: Cardiology

## 2023-10-07 ENCOUNTER — Other Ambulatory Visit (HOSPITAL_COMMUNITY): Payer: Self-pay

## 2023-10-23 ENCOUNTER — Ambulatory Visit: Payer: BC Managed Care – PPO | Admitting: Internal Medicine

## 2023-10-23 ENCOUNTER — Encounter: Payer: Self-pay | Admitting: Internal Medicine

## 2023-10-23 VITALS — BP 112/80 | HR 76 | Temp 98.1°F | Ht 70.0 in | Wt 220.8 lb

## 2023-10-23 DIAGNOSIS — I10 Essential (primary) hypertension: Secondary | ICD-10-CM | POA: Diagnosis not present

## 2023-10-23 DIAGNOSIS — M545 Low back pain, unspecified: Secondary | ICD-10-CM | POA: Diagnosis not present

## 2023-10-23 DIAGNOSIS — F419 Anxiety disorder, unspecified: Secondary | ICD-10-CM

## 2023-10-23 DIAGNOSIS — R635 Abnormal weight gain: Secondary | ICD-10-CM

## 2023-10-23 LAB — COMPREHENSIVE METABOLIC PANEL WITH GFR
ALT: 25 U/L (ref 0–35)
AST: 19 U/L (ref 0–37)
Albumin: 4.2 g/dL (ref 3.5–5.2)
Alkaline Phosphatase: 85 U/L (ref 39–117)
BUN: 13 mg/dL (ref 6–23)
CO2: 30 meq/L (ref 19–32)
Calcium: 9.4 mg/dL (ref 8.4–10.5)
Chloride: 105 meq/L (ref 96–112)
Creatinine, Ser: 0.94 mg/dL (ref 0.40–1.20)
GFR: 62.75 mL/min (ref >60.00)
Glucose, Bld: 82 mg/dL (ref 70–99)
Potassium: 4.4 meq/L (ref 3.5–5.1)
Sodium: 140 meq/L (ref 135–145)
Total Bilirubin: 1.5 mg/dL — ABNORMAL HIGH (ref 0.2–1.2)
Total Protein: 7.1 g/dL (ref 6.0–8.3)

## 2023-10-23 LAB — TSH: TSH: 0.85 u[IU]/mL (ref 0.35–5.50)

## 2023-10-23 LAB — T4, FREE: Free T4: 0.89 ng/dL (ref 0.60–1.60)

## 2023-10-23 LAB — CK: Total CK: 50 U/L (ref 7–177)

## 2023-10-23 MED ORDER — ALPRAZOLAM 0.5 MG PO TABS: ORAL_TABLET | ORAL | 1 refills | Status: DC

## 2023-10-23 MED ORDER — NAPROXEN 500 MG PO TABS: 500.0000 mg | ORAL_TABLET | Freq: Every day | ORAL | 1 refills | Status: DC | PRN

## 2023-10-23 NOTE — Assessment & Plan Note (Signed)
 Wegovy  is not working any more - weaning off F/u w/Dr Veryl Gottron

## 2023-10-23 NOTE — Assessment & Plan Note (Signed)
 Cont on Irbesartan ; Bumex  stopped

## 2023-10-23 NOTE — Assessment & Plan Note (Signed)
Cont on Alprazolam prn ° Potential benefits of a long term benzodiazepines  use as well as potential risks  and complications were explained to the patient and were aknowledged. °

## 2023-10-23 NOTE — Progress Notes (Signed)
 Subjective:  Patient ID: Courtney Nielsen, female    DOB: 1955-10-20  Age: 68 y.o. MRN: [id number]  CC: Medical Management of Chronic Issues (6 Month Follow up. Patient notes ongoing right hip pain as well as right knee pain, lower back pain. Med refill on alprazolam )   HPI [name] presents for a 6 Month Follow up. Patient notes ongoing right hip pain as well as right knee pain, lower back pain. Med refill on alprazolam  Aleve  helps Wegovy  is not working any more - weaning off  F/u on GERD  Outpatient Medications Prior to Visit  Medication Sig Dispense Refill   acetaminophen  (TYLENOL ) 500 MG tablet Take 1,000 mg by mouth every 6 (six) hours as needed for moderate pain.      Ascorbic Acid (VITAMIN C) 1000 MG tablet Take 1 tablet by mouth daily.     aspirin EC 81 MG tablet Take 81 mg by mouth at bedtime.      B Complex-C (SUPER B COMPLEX/VITAMIN C PO) Take 1 tablet by mouth daily.     Cholecalciferol (VITAMIN D3) 50 MCG (2000 UT) TABS Take 2,000 Units by mouth at bedtime.     irbesartan  (AVAPRO ) 300 MG tablet TAKE 1 TABLET AT BEDTIME 90 tablet 3   loratadine  (CLARITIN ) 10 MG tablet Take 10 mg by mouth at bedtime.     Omega-3 Fatty Acids (FISH OIL) 500 MG CAPS Take 500 mg by mouth at bedtime.      rosuvastatin  (CRESTOR ) 5 MG tablet TAKE 1 TABLET BY MOUTH DAILY 30 tablet 3   Semaglutide -Weight Management (WEGOVY ) 0.5 MG/0.5ML SOAJ Inject 0.5 mg into the skin once a week. 2 mL 0   Semaglutide -Weight Management (WEGOVY ) 1 MG/0.5ML SOAJ Inject 1 mg into the skin once a week. 2 mL 0   Semaglutide -Weight Management (WEGOVY ) 1.7 MG/0.75ML SOAJ Inject 1.7 mg into the skin once a week. 3 mL 0   vitamin B-12 (CYANOCOBALAMIN) 1000 MCG tablet Take 1,000 mcg by mouth at bedtime.     ALPRAZolam  (XANAX ) 0.5 MG tablet TAKE (1) TABLET BY MOUTH AT BEDTIME. 90 tablet 1   naproxen  sodium (ALEVE ) 220 MG tablet Take 220 mg by mouth daily as needed.     Menthol , Topical Analgesic, (BLUE-EMU MAXIMUM  STRENGTH EX) Apply 1 application topically daily. (Patient not taking: Reported on 07/09/2023)     No facility-administered medications prior to visit.    ROS: Review of Systems  Constitutional:  Negative for activity change, appetite change, chills, fatigue and unexpected weight change.  HENT:  Negative for congestion, mouth sores and sinus pressure.   Eyes:  Negative for visual disturbance.  Respiratory:  Negative for cough and chest tightness.   Gastrointestinal:  Negative for abdominal pain and nausea.  Genitourinary:  Negative for difficulty urinating, frequency and vaginal pain.  Musculoskeletal:  Positive for arthralgias and gait problem. Negative for back pain.  Skin:  Negative for pallor and rash.  Neurological:  Negative for dizziness, tremors, weakness, numbness and headaches.  Psychiatric/Behavioral:  Negative for confusion, sleep disturbance and suicidal ideas. The patient is nervous/anxious.     Objective:  BP 112/80   Pulse 76   Temp 98.1 F (36.7 C)   Ht 5\' 10"  (1.778 m)   Wt 220 lb 12.8 oz (100.2 kg)   SpO2 97%   BMI 31.68 kg/m   BP Readings from Last 3 Encounters:  10/23/23 112/80  07/09/23 134/70  04/24/23 110/76    Wt Readings from Last 3 Encounters:  10/23/23 220 lb 12.8 oz (100.2 kg)  07/09/23 205 lb 9.6 oz (93.3 kg)  04/24/23 204 lb (92.5 kg)    Physical Exam Constitutional:      General: She is not in acute distress.    Appearance: She is well-developed.  HENT:     Head: Normocephalic.     Right Ear: External ear normal.     Left Ear: External ear normal.     Nose: Nose normal.  Eyes:     General:        Right eye: No discharge.        Left eye: No discharge.     Conjunctiva/sclera: Conjunctivae normal.     Pupils: Pupils are equal, round, and reactive to light.  Neck:     Thyroid : No thyromegaly.     Vascular: No JVD.     Trachea: No tracheal deviation.  Cardiovascular:     Rate and Rhythm: Normal rate and regular rhythm.     Heart  sounds: Normal heart sounds.  Pulmonary:     Effort: No respiratory distress.     Breath sounds: No stridor. No wheezing.  Abdominal:     General: Bowel sounds are normal. There is no distension.     Palpations: Abdomen is soft. There is no mass.     Tenderness: There is no abdominal tenderness. There is no guarding or rebound.  Musculoskeletal:        General: No tenderness.     Cervical back: Normal range of motion and neck supple. No rigidity.  Lymphadenopathy:     Cervical: No cervical adenopathy.  Skin:    Findings: No erythema or rash.  Neurological:     Mental Status: She is oriented to person, place, and time.     Cranial Nerves: No cranial nerve deficit.     Motor: No abnormal muscle tone.     Coordination: Coordination normal.     Gait: Gait abnormal.     Deep Tendon Reflexes: Reflexes normal.  Psychiatric:        Behavior: Behavior normal.        Thought Content: Thought content normal.        Judgment: Judgment normal.    Antalgic gait R hip, R knee w/pain   Lab Results  Component Value Date   WBC 8.2 04/16/2023   HGB 13.6 04/16/2023   HCT 41.6 04/16/2023   PLT 245.0 04/16/2023   GLUCOSE 91 04/16/2023   CHOL 110 04/16/2023   TRIG 80.0 04/16/2023   HDL 45.60 04/16/2023   LDLDIRECT 164.6 01/05/2008   LDLCALC 48 04/16/2023   ALT 17 04/16/2023   AST 18 04/16/2023   NA 139 04/16/2023   K 3.8 04/16/2023   CL 103 04/16/2023   CREATININE 0.94 04/16/2023   BUN 7 04/16/2023   CO2 28 04/16/2023   TSH 0.85 04/16/2023   INR 0.9 08/03/2019   HGBA1C 5.5 08/02/2021    US  Abdomen Limited RUQ (LIVER/GB) Result Date: 05/01/2023 CLINICAL DATA:  Elevated bilirubin EXAM: ULTRASOUND ABDOMEN LIMITED RIGHT UPPER QUADRANT COMPARISON:  CT chest 08/30/2017 FINDINGS: Gallbladder: Surgically absent Common bile duct: Diameter: 7 mm Liver: Increased echogenicity. No focal lesion. Portal vein is patent on color Doppler imaging with normal direction of blood flow towards the  liver. Other: None. IMPRESSION: 1. Status post cholecystectomy. 2. Increased hepatic parenchymal echogenicity suggestive of steatosis. Electronically Signed   By: Jone Neither M.D.   On: 05/01/2023 15:13    Assessment & Plan:  Problem List Items Addressed This Visit     Essential hypertension - Primary   Cont on Irbesartan ; Bumex  stopped      Relevant Orders   Comprehensive metabolic panel with GFR   CK   TSH   T4, free   Low back pain   Naproxen  prn w/caution pc D/c Aleve       Relevant Medications   naproxen  (NAPROSYN ) 500 MG tablet   Other Relevant Orders   CK   Weight gain   Wegovy  is not working any more - weaning off F/u w/Dr Veryl Gottron       Relevant Orders   Comprehensive metabolic panel with GFR   CK   TSH   T4, free   Anxiety   Cont on Alprazolam  prn  Potential benefits of a long term benzodiazepines  use as well as potential risks  and complications were explained to the patient and were aknowledged.      Relevant Medications   ALPRAZolam  (XANAX ) 0.5 MG tablet      Meds ordered this encounter  Medications   naproxen  (NAPROSYN ) 500 MG tablet    Sig: Take 1 tablet (500 mg total) by mouth daily as needed for moderate pain (pain score 4-6).    Dispense:  90 tablet    Refill:  1   ALPRAZolam  (XANAX ) 0.5 MG tablet    Sig: TAKE (1) TABLET BY MOUTH AT BEDTIME.    Dispense:  90 tablet    Refill:  1      Follow-up: Return in about 6 months (around 04/23/2024) for Wellness Exam.  Anitra Barn, MD

## 2023-10-23 NOTE — Assessment & Plan Note (Signed)
 Naproxen  prn w/caution pc D/c Aleve 

## 2023-10-24 ENCOUNTER — Encounter: Payer: Self-pay | Admitting: Internal Medicine

## 2024-01-15 ENCOUNTER — Other Ambulatory Visit (HOSPITAL_BASED_OUTPATIENT_CLINIC_OR_DEPARTMENT_OTHER): Payer: Self-pay

## 2024-01-15 ENCOUNTER — Ambulatory Visit (HOSPITAL_BASED_OUTPATIENT_CLINIC_OR_DEPARTMENT_OTHER): Admitting: Cardiology

## 2024-01-15 ENCOUNTER — Encounter (HOSPITAL_BASED_OUTPATIENT_CLINIC_OR_DEPARTMENT_OTHER): Payer: Self-pay | Admitting: Cardiology

## 2024-01-15 VITALS — BP 130/80 | HR 77 | Ht 70.0 in | Wt 227.0 lb

## 2024-01-15 DIAGNOSIS — E7800 Pure hypercholesterolemia, unspecified: Secondary | ICD-10-CM | POA: Diagnosis not present

## 2024-01-15 DIAGNOSIS — I10 Essential (primary) hypertension: Secondary | ICD-10-CM | POA: Diagnosis not present

## 2024-01-15 DIAGNOSIS — Z7189 Other specified counseling: Secondary | ICD-10-CM

## 2024-01-15 DIAGNOSIS — E66811 Obesity, class 1: Secondary | ICD-10-CM | POA: Diagnosis not present

## 2024-01-15 DIAGNOSIS — I700 Atherosclerosis of aorta: Secondary | ICD-10-CM

## 2024-01-15 MED ORDER — ROSUVASTATIN CALCIUM 5 MG PO TABS
5.0000 mg | ORAL_TABLET | Freq: Every day | ORAL | 3 refills | Status: AC
  Filled 2024-01-15: qty 30, 30d supply, fill #0
  Filled 2024-01-27: qty 90, 90d supply, fill #0
  Filled 2024-04-24: qty 30, 30d supply, fill #1
  Filled 2024-05-26: qty 30, 30d supply, fill #2
  Filled 2024-06-28: qty 30, 30d supply, fill #3
  Filled 2024-07-24: qty 30, 30d supply, fill #4

## 2024-01-15 NOTE — Progress Notes (Signed)
 Cardiology Office Note:  .    Date:  01/15/2024  ID:  Courtney Nielsen, DOB January 20, 1956, MRN [id number] PCP: Garald Karlynn GAILS, MD  [facility] Providers Cardiologist:  Shelda Bruckner, MD     History of Present Illness: .    Courtney Nielsen is a 68 y.o. female with a hx of obesity, COPD, hypertension who is seen for follow up. I initially saw her 12/26/18 as a new consult at the request of Plotnikov, Karlynn GAILS, MD for the evaluation and management of shortness of breath, edema, and chest pain.   Cardiac history: Echocardiogram 12/24/18 showed EF 55-60%, grade 1 diastolic dysfunction. No significant valve disease. Labs showed Cr 1.03, nl TSH, largely nl CBC (borderline eosinophils), normal LFTs, normal D dimer, normal BNP of 12.   Has done extremely well with Wegovy . Starting weight 246 lbs in 2022, BMI 35 at that time.  Today: Has a lot of stress caregiving for her mother and her husband. She has been having a lot of hip pain, but hasn't had the time for herself to see orthopedics for this.  Has gained weight since January, up between 20-25 lbs from her weight in January. Weaning down to 0.5 mg wegovy  dose. Has been weaning due to stomach issues and lack of coverage for wegovy  any more. Suspect this plus stress has been leading to weight gain. She doesn't feel that she is eating a lot.   Has noticed intermittent LE edema, worse at the end of the day, better in the morning. Hasn't needed fluid pill for this. Reviewed when to use.  ROS: Denies chest pain, shortness of breath at rest or with normal exertion. No PND, orthopnea, or unexpected weight gain. No syncope or palpitations. ROS otherwise negative except as noted.   Studies Reviewed: SABRA    ECG: EKG Interpretation Date/Time:  Wednesday January 15 2024 09:00:53 EDT Ventricular Rate:  77 PR Interval:  166 QRS Duration:  88 QT Interval:  368 QTC Calculation: 416 R Axis:   2  Text Interpretation: Normal sinus rhythm  Normal ECG When compared with ECG of 15-Mar-2023 08:40, No significant change was found Confirmed by Bruckner Shelda [phone]) on 01/15/2024 9:08:20 AM       Physical Exam:    VS:  BP 130/80 (BP Location: Left Arm, Patient Position: Sitting, Cuff Size: Normal)   Pulse 77   Ht 5' 10 (1.778 m)   Wt 227 lb (103 kg)   BMI 32.57 kg/m    Wt Readings from Last 3 Encounters:  01/15/24 227 lb (103 kg)  10/23/23 220 lb 12.8 oz (100.2 kg)  07/09/23 205 lb 9.6 oz (93.3 kg)    GEN: Well nourished, well developed in no acute distress HEENT: Normal, moist mucous membranes NECK: No JVD CARDIAC: regular rhythm, normal S1 and S2, no rubs or gallops. No murmur. VASCULAR: Radial and DP pulses 2+ bilaterally. No carotid bruits RESPIRATORY:  Clear to auscultation without rales, wheezing or rhonchi  ABDOMEN: Soft, non-tender, non-distended MUSCULOSKELETAL:  Ambulates independently SKIN: Warm and dry, no edema NEUROLOGIC:  Alert and oriented x 3. No focal neuro deficits noted. PSYCHIATRIC:  Normal affect    ASSESSMENT AND PLAN: .    Hypertension: goal <130/80.  -on irbesartan , continue   Obesity: BMI 35 with CV risk factors-->BMI 30 --> 32 today. Starting weight 246 lbs. -had to taper wegovy  due to lack of coverage as well as some stomach bloating -height is 5 ft 10.5. Goal weight 180 lbs per  patient. A weight of 175 would put her BMI under 25. -shortness of breath, LE edema totally resolved with weight loss -would consider re-start of GLP1 in the future if covered by her insurance; might try zepbound over wegovy  if available   Hypercholesterolemia Aortic atherosclerosis (CT 2019) -rosuvastatin  stopped in January due to stomach issues. Didn't notice a difference in her symptoms. Restart low dose today. -tolerating aspirin   Cardiac risk counseling and prevention recommendations: -recommend heart healthy/Mediterranean diet, with whole grains, fruits, vegetable, fish, lean meats, nuts, and  olive oil. Limit salt. -recommend moderate walking, 3-5 times/week for 30-50 minutes each session. Aim for at least 150 minutes.week. Goal should be pace of 3 miles/hours, or walking 1.5 miles in 30 minutes -recommend avoidance of tobacco products. Avoid excess alcohol. -ASCVD risk score: The ASCVD Risk score (Arnett DK, et al., 2019) failed to calculate for the following reasons:   The valid total cholesterol range is 130 to 320 mg/dL   Dispo: Follow-up in 6 months, or sooner as needed.  Signed, Shelda Bruckner, MD

## 2024-01-15 NOTE — Patient Instructions (Signed)

## 2024-01-25 ENCOUNTER — Other Ambulatory Visit (HOSPITAL_BASED_OUTPATIENT_CLINIC_OR_DEPARTMENT_OTHER): Payer: Self-pay

## 2024-01-27 ENCOUNTER — Other Ambulatory Visit (HOSPITAL_BASED_OUTPATIENT_CLINIC_OR_DEPARTMENT_OTHER): Payer: Self-pay

## 2024-01-29 ENCOUNTER — Other Ambulatory Visit (HOSPITAL_BASED_OUTPATIENT_CLINIC_OR_DEPARTMENT_OTHER): Payer: Self-pay

## 2024-03-12 ENCOUNTER — Encounter (HOSPITAL_BASED_OUTPATIENT_CLINIC_OR_DEPARTMENT_OTHER): Payer: Self-pay

## 2024-03-13 ENCOUNTER — Telehealth: Payer: Self-pay | Admitting: Cardiology

## 2024-03-13 ENCOUNTER — Other Ambulatory Visit (HOSPITAL_BASED_OUTPATIENT_CLINIC_OR_DEPARTMENT_OTHER): Payer: Self-pay

## 2024-03-13 MED ORDER — BUMETANIDE 1 MG PO TABS: 1.0000 mg | ORAL_TABLET | Freq: Every day | ORAL | 0 refills | Status: AC | PRN

## 2024-03-13 NOTE — Telephone Encounter (Signed)
 MyChart sent to patient, see separate encounter.   If significant swelling noted by MyChart response or no response by 4:30 pm, could provide 10 tabs of Bumex  1mg  PRN daily as a courtesy and schedule cardiology follow up for evaluation as edema  Reche GORMAN Finder, NP

## 2024-03-13 NOTE — Telephone Encounter (Signed)
 Pt c/o swelling/edema: STAT if pt has developed SOB within 24 hours  If swelling, where is the swelling located? Feet up to ankles  How much weight have you gained and in what time span? Unknown  Have you gained 2 pounds in a day or 5 pounds in a week? Unknown  Do you have a log of your daily weights (if so, list)? Unknown  Are you currently taking a fluid pill? No, needs bumetanide  (BUMEX ) 1 MG tablet refilled  Are you currently SOB? No  Have you traveled recently in a car or plane for an extended period of time? No  Pt c/o medication issue:  1. Name of Medication: bumetanide  (BUMEX ) 1 MG tablet   2. How are you currently taking this medication (dosage and times per day)? N/A; old prescription  3. Are you having a reaction (difficulty breathing--STAT)? No  4. What is your medication issue? Pt needs new prescription sent in to pharmacy for feet and ankle swelling. Please advise.

## 2024-03-13 NOTE — Telephone Encounter (Signed)
 Addressed via separate Mychart encounter. Will remove from triage.   Sterling Mondo S Sanja Elizardo, NP

## 2024-04-01 DIAGNOSIS — M25551 Pain in right hip: Secondary | ICD-10-CM | POA: Diagnosis not present

## 2024-04-01 DIAGNOSIS — Z96651 Presence of right artificial knee joint: Secondary | ICD-10-CM | POA: Diagnosis not present

## 2024-04-01 DIAGNOSIS — M1611 Unilateral primary osteoarthritis, right hip: Secondary | ICD-10-CM | POA: Diagnosis not present

## 2024-04-08 DIAGNOSIS — M25551 Pain in right hip: Secondary | ICD-10-CM | POA: Diagnosis not present

## 2024-04-20 ENCOUNTER — Other Ambulatory Visit: Payer: Self-pay | Admitting: Internal Medicine

## 2024-04-24 ENCOUNTER — Other Ambulatory Visit (HOSPITAL_BASED_OUTPATIENT_CLINIC_OR_DEPARTMENT_OTHER): Payer: Self-pay

## 2024-04-24 ENCOUNTER — Encounter: Payer: Self-pay | Admitting: Internal Medicine

## 2024-04-27 ENCOUNTER — Other Ambulatory Visit: Payer: Self-pay | Admitting: Internal Medicine

## 2024-04-27 ENCOUNTER — Other Ambulatory Visit (INDEPENDENT_AMBULATORY_CARE_PROVIDER_SITE_OTHER)

## 2024-04-27 DIAGNOSIS — Z0000 Encounter for general adult medical examination without abnormal findings: Secondary | ICD-10-CM

## 2024-04-27 DIAGNOSIS — R1030 Lower abdominal pain, unspecified: Secondary | ICD-10-CM | POA: Diagnosis not present

## 2024-04-27 DIAGNOSIS — R350 Frequency of micturition: Secondary | ICD-10-CM | POA: Diagnosis not present

## 2024-04-27 DIAGNOSIS — N898 Other specified noninflammatory disorders of vagina: Secondary | ICD-10-CM | POA: Diagnosis not present

## 2024-04-27 LAB — URINALYSIS, ROUTINE W REFLEX MICROSCOPIC
Bilirubin Urine: NEGATIVE
Hgb urine dipstick: NEGATIVE
Ketones, ur: NEGATIVE
Nitrite: NEGATIVE
RBC / HPF: NONE SEEN (ref 0–?)
Specific Gravity, Urine: 1.01 (ref 1.000–1.030)
Total Protein, Urine: NEGATIVE
Urine Glucose: NEGATIVE
Urobilinogen, UA: 0.2 (ref 0.0–1.0)
pH: 7 (ref 5.0–8.0)

## 2024-04-27 LAB — LIPID PANEL
Cholesterol: 153 mg/dL (ref 0–200)
HDL: 60 mg/dL (ref >39.00)
LDL Cholesterol: 73 mg/dL (ref 0–99)
NonHDL: 93.16
Total CHOL/HDL Ratio: 3
Triglycerides: 100 mg/dL (ref 0.0–149.0)
VLDL: 20 mg/dL (ref 0.0–40.0)

## 2024-04-27 LAB — CBC WITH DIFFERENTIAL/PLATELET
Basophils Absolute: 0.1 K/uL (ref 0.0–0.1)
Basophils Relative: 0.7 % (ref 0.0–3.0)
Eosinophils Absolute: 0.2 K/uL (ref 0.0–0.7)
Eosinophils Relative: 2.3 % (ref 0.0–5.0)
HCT: 43.8 % (ref 36.0–46.0)
Hemoglobin: 14.5 g/dL (ref 12.0–15.0)
Lymphocytes Relative: 25.2 % (ref 12.0–46.0)
Lymphs Abs: 2.5 K/uL (ref 0.7–4.0)
MCHC: 33 g/dL (ref 30.0–36.0)
MCV: 93.5 fl (ref 78.0–100.0)
Monocytes Absolute: 0.7 K/uL (ref 0.1–1.0)
Monocytes Relative: 6.9 % (ref 3.0–12.0)
Neutro Abs: 6.4 K/uL (ref 1.4–7.7)
Neutrophils Relative %: 64.9 % (ref 43.0–77.0)
Platelets: 273 K/uL (ref 150.0–400.0)
RBC: 4.69 Mil/uL (ref 3.87–5.11)
RDW: 13.5 % (ref 11.5–15.5)
WBC: 9.9 K/uL (ref 4.0–10.5)

## 2024-04-27 LAB — COMPREHENSIVE METABOLIC PANEL WITH GFR
ALT: 21 U/L (ref 0–35)
AST: 17 U/L (ref 0–37)
Albumin: 4.2 g/dL (ref 3.5–5.2)
Alkaline Phosphatase: 96 U/L (ref 39–117)
BUN: 10 mg/dL (ref 6–23)
CO2: 25 meq/L (ref 19–32)
Calcium: 9.3 mg/dL (ref 8.4–10.5)
Chloride: 103 meq/L (ref 96–112)
Creatinine, Ser: 0.96 mg/dL (ref 0.40–1.20)
GFR: 60.96 mL/min
Glucose, Bld: 99 mg/dL (ref 70–99)
Potassium: 4.2 meq/L (ref 3.5–5.1)
Sodium: 137 meq/L (ref 135–145)
Total Bilirubin: 1.8 mg/dL — ABNORMAL HIGH (ref 0.2–1.2)
Total Protein: 7.4 g/dL (ref 6.0–8.3)

## 2024-04-27 LAB — TSH: TSH: 0.84 u[IU]/mL (ref 0.35–5.50)

## 2024-04-29 ENCOUNTER — Ambulatory Visit: Admitting: Internal Medicine

## 2024-04-29 ENCOUNTER — Ambulatory Visit: Payer: Self-pay | Admitting: Internal Medicine

## 2024-04-29 ENCOUNTER — Other Ambulatory Visit (HOSPITAL_BASED_OUTPATIENT_CLINIC_OR_DEPARTMENT_OTHER): Payer: Self-pay

## 2024-04-29 ENCOUNTER — Encounter: Payer: Self-pay | Admitting: Internal Medicine

## 2024-04-29 ENCOUNTER — Ambulatory Visit
Admission: RE | Admit: 2024-04-29 | Discharge: 2024-04-29 | Disposition: A | Discharge status: Home / Self Care | Source: Ambulatory Visit | Attending: Internal Medicine | Admitting: Internal Medicine

## 2024-04-29 VITALS — BP 128/72 | HR 85 | Temp 98.2°F | Ht 70.0 in | Wt 234.4 lb

## 2024-04-29 DIAGNOSIS — M1611 Unilateral primary osteoarthritis, right hip: Secondary | ICD-10-CM | POA: Diagnosis not present

## 2024-04-29 DIAGNOSIS — F419 Anxiety disorder, unspecified: Secondary | ICD-10-CM | POA: Diagnosis not present

## 2024-04-29 DIAGNOSIS — R1031 Right lower quadrant pain: Secondary | ICD-10-CM

## 2024-04-29 DIAGNOSIS — M5416 Radiculopathy, lumbar region: Secondary | ICD-10-CM | POA: Diagnosis not present

## 2024-04-29 DIAGNOSIS — Z0000 Encounter for general adult medical examination without abnormal findings: Secondary | ICD-10-CM

## 2024-04-29 DIAGNOSIS — K5730 Diverticulosis of large intestine without perforation or abscess without bleeding: Secondary | ICD-10-CM | POA: Diagnosis not present

## 2024-04-29 MED ORDER — ALPRAZOLAM 0.5 MG PO TABS
ORAL_TABLET | ORAL | 1 refills | Status: AC
  Filled 2024-04-29: qty 30, 30d supply, fill #0
  Filled 2024-05-26: qty 30, 30d supply, fill #1
  Filled 2024-06-28: qty 30, 30d supply, fill #2
  Filled 2024-07-23 – 2024-07-27 (×2): qty 30, 30d supply, fill #3

## 2024-04-29 MED ORDER — IOPAMIDOL (ISOVUE-300) INJECTION 61%
100.0000 mL | Freq: Once | INTRAVENOUS | Status: AC | PRN
  Administered 2024-04-29: 100 mL via INTRAVENOUS

## 2024-04-29 MED ORDER — NITROFURANTOIN MONOHYD MACRO 100 MG PO CAPS
100.0000 mg | ORAL_CAPSULE | Freq: Two times a day (BID) | ORAL | 0 refills | Status: AC
  Filled 2024-04-29: qty 10, 5d supply, fill #0

## 2024-04-29 NOTE — Assessment & Plan Note (Signed)
 We discussed age appropriate health related issues, including available/recomended screening tests and vaccinations. Labs were ordered to be later reviewed . All questions were answered. We discussed one or more of the following - seat belt use, use of sunscreen/sun exposure exercise, fall risk reduction, second hand smoke exposure, firearm use and storage, seat belt use, a need for adhering to healthy diet and exercise. Labs were ordered.  All questions were answered. CT angio March 2019 - no CAD, PE Colon 2018, 2023 Dr Aneita Lye just saw her gynecologist Flu shot given

## 2024-04-29 NOTE — Addendum Note (Signed)
 Addended by: Juanda Luba V on: 04/29/2024 09:36 AM   Modules accepted: Level of Service

## 2024-04-29 NOTE — Assessment & Plan Note (Signed)
 Worse Needs a R THR - Dr Melodi (?2026)

## 2024-04-29 NOTE — Assessment & Plan Note (Addendum)
 New abd pain since Sat.  They were at a wedding.  She ate a normal meal.  Later she developed severe abdominal pain in the lower part, bloating.  The pain later moved to the right side.  She noted a foul smell in the urine or with vaginally.  There was no significant vaginal discharge, however.  Courtney Nielsen did not sleep for two nights on Saturday and Sunday because of the pain and bloating.  There was no vomiting.  No dysuria. Pt saw her GYN the other day and exam was normal.  Urine test was repeated. Go to the ER if worse RLQ pain - r/o appendcitis Possible UTI -given Macrobid PO empirically Abd CT

## 2024-04-29 NOTE — Progress Notes (Addendum)
 Subjective:  Patient ID: Courtney Nielsen, female    DOB: 03/06/1956  Age: 68 y.o. MRN: [id number]  CC: Annual Exam (Annual Exam)   HPI Courtney Nielsen presents for [name] presents for a well exam C/o abd pain since Sat.  They were at a wedding.  Courtney Nielsen ate a normal meal.  Later Courtney Nielsen developed severe abdominal pain in the lower part, bloating.  The pain later moved to the right side.  Courtney Nielsen noted a foul smell in the urine or with vaginally.  There was no significant vaginal discharge, however.  Courtney Nielsen did not sleep for two nights on Saturday and Sunday because of the pain and bloating.  There was no vomiting.  No dysuria. Pt saw her GYN the other day and exam was normal.  Urine test was repeated.  Outpatient Medications Prior to Visit  Medication Sig Dispense Refill   acetaminophen  (TYLENOL ) 500 MG tablet Take 1,000 mg by mouth every 6 (six) hours as needed for moderate pain.      Ascorbic Acid (VITAMIN C) 1000 MG tablet Take 1 tablet by mouth daily.     aspirin EC 81 MG tablet Take 81 mg by mouth at bedtime.      B Complex-C (SUPER B COMPLEX/VITAMIN C PO) Take 1 tablet by mouth daily.     bumetanide  (BUMEX ) 1 MG tablet Take 1 tablet (1 mg total) by mouth daily as needed (Take as needed for swelling. If taking more than twice per week, please contact cardiology [phone]). 30 tablet 0   Cholecalciferol (VITAMIN D3) 50 MCG (2000 UT) TABS Take 2,000 Units by mouth at bedtime.     irbesartan  (AVAPRO ) 300 MG tablet TAKE 1 TABLET AT BEDTIME 90 tablet 3   loratadine  (CLARITIN ) 10 MG tablet Take 10 mg by mouth at bedtime.     Menthol , Topical Analgesic, (BLUE-EMU MAXIMUM STRENGTH EX) Apply 1 application topically daily.     naproxen  (NAPROSYN ) 500 MG tablet TAKE 1 TABLET(500 MG) BY MOUTH DAILY AS NEEDED FOR MODERATE PAIN 90 tablet 1   Omega-3 Fatty Acids (FISH OIL) 500 MG CAPS Take 500 mg by mouth at bedtime.      rosuvastatin  (CRESTOR ) 5 MG tablet Take 1 tablet (5 mg total) by mouth  daily. 90 tablet 3   vitamin B-12 (CYANOCOBALAMIN) 1000 MCG tablet Take 1,000 mcg by mouth at bedtime.     ALPRAZolam  (XANAX ) 0.5 MG tablet TAKE (1) TABLET BY MOUTH AT BEDTIME. 90 tablet 1   No facility-administered medications prior to visit.    ROS: Review of Systems  Constitutional:  Positive for fatigue. Negative for activity change, appetite change, chills and unexpected weight change.  HENT:  Negative for congestion, mouth sores and sinus pressure.   Eyes:  Negative for visual disturbance.  Respiratory:  Negative for cough and chest tightness.   Gastrointestinal:  Positive for abdominal pain. Negative for blood in stool, constipation and nausea.  Genitourinary:  Negative for difficulty urinating, frequency and vaginal pain.  Musculoskeletal:  Positive for arthralgias and back pain. Negative for gait problem.  Skin:  Negative for pallor and rash.  Neurological:  Negative for dizziness, tremors, weakness, numbness and headaches.  Psychiatric/Behavioral:  Negative for confusion and sleep disturbance.     Objective:  BP 128/72   Pulse 85   Temp 98.2 F (36.8 C)   Ht 5' 10 (1.778 m)   Wt 234 lb 6.4 oz (106.3 kg)   SpO2 97%   BMI 33.63 kg/m  BP Readings from Last 3 Encounters:  04/29/24 128/72  01/15/24 130/80  10/23/23 112/80    Wt Readings from Last 3 Encounters:  04/29/24 234 lb 6.4 oz (106.3 kg)  01/15/24 227 lb (103 kg)  10/23/23 220 lb 12.8 oz (100.2 kg)    Physical Exam Constitutional:      General: Courtney Nielsen is not in acute distress.    Appearance: Courtney Nielsen is well-developed. Courtney Nielsen is obese.  HENT:     Head: Normocephalic.     Right Ear: External ear normal.     Left Ear: External ear normal.     Nose: Nose normal.  Eyes:     General:        Right eye: No discharge.        Left eye: No discharge.     Conjunctiva/sclera: Conjunctivae normal.     Pupils: Pupils are equal, round, and reactive to light.  Neck:     Thyroid : No thyromegaly.     Vascular: No JVD.      Trachea: No tracheal deviation.  Cardiovascular:     Rate and Rhythm: Normal rate and regular rhythm.     Heart sounds: Normal heart sounds.  Pulmonary:     Effort: No respiratory distress.     Breath sounds: No stridor. No wheezing.  Abdominal:     General: Bowel sounds are normal. There is no distension.     Palpations: Abdomen is soft. There is no mass.     Tenderness: There is abdominal tenderness. There is no guarding or rebound.  Musculoskeletal:        General: Tenderness present.     Cervical back: Normal range of motion and neck supple. No rigidity.  Lymphadenopathy:     Cervical: No cervical adenopathy.  Skin:    Findings: No erythema or rash.  Neurological:     Mental Status: Courtney Nielsen is oriented to person, place, and time.     Cranial Nerves: No cranial nerve deficit.     Motor: No abnormal muscle tone.     Coordination: Coordination normal.     Deep Tendon Reflexes: Reflexes normal.  Psychiatric:        Behavior: Behavior normal.        Thought Content: Thought content normal.        Judgment: Judgment normal.    RLQ is tender, +/- rebound No mass  I spent 22 minutes in addition to time for CPX wellness examination in preparing to see the patient by review of recent labs, imaging and procedures, obtaining and reviewing separately obtained history, communicating with the patient, ordering medications, tests or procedures, and documenting clinical information in the EHR including the differential diagnosis, treatment, and any further evaluation and other management of right lower quadrant abdominal pain, rule out appendicitis.  CT was ordered        Lab Results  Component Value Date   WBC 9.9 04/27/2024   HGB 14.5 04/27/2024   HCT 43.8 04/27/2024   PLT 273.0 04/27/2024   GLUCOSE 99 04/27/2024   CHOL 153 04/27/2024   TRIG 100.0 04/27/2024   HDL 60.00 04/27/2024   LDLDIRECT 164.6 01/05/2008   LDLCALC 73 04/27/2024   ALT 21 04/27/2024   AST 17 04/27/2024   NA  137 04/27/2024   K 4.2 04/27/2024   CL 103 04/27/2024   CREATININE 0.96 04/27/2024   BUN 10 04/27/2024   CO2 25 04/27/2024   TSH 0.84 04/27/2024   INR 0.9 08/03/2019   HGBA1C 5.5  08/02/2021    US  Abdomen Limited RUQ (LIVER/GB) Result Date: 05/01/2023 CLINICAL DATA:  Elevated bilirubin EXAM: ULTRASOUND ABDOMEN LIMITED RIGHT UPPER QUADRANT COMPARISON:  CT chest 08/30/2017 FINDINGS: Gallbladder: Surgically absent Common bile duct: Diameter: 7 mm Liver: Increased echogenicity. No focal lesion. Portal vein is patent on color Doppler imaging with normal direction of blood flow towards the liver. Other: None. IMPRESSION: 1. Status post cholecystectomy. 2. Increased hepatic parenchymal echogenicity suggestive of steatosis. Electronically Signed   By: Bard Moats M.D.   On: 05/01/2023 15:13    Assessment & Plan:   Problem List Items Addressed This Visit     Anxiety - Primary   Cont on Alprazolam  prn  Potential benefits of a long term benzodiazepines  use as well as potential risks  and complications were explained to the patient and were aknowledged.      Relevant Medications   ALPRAZolam  (XANAX ) 0.5 MG tablet   Arthritis of right hip   Worse Needs a R THR - Dr Melodi (?2026)      Lumbar radiculopathy   Chronic      Relevant Medications   ALPRAZolam  (XANAX ) 0.5 MG tablet   Right lower quadrant abdominal pain   New abd pain since Sat.  They were at a wedding.  Courtney Nielsen ate a normal meal.  Later Courtney Nielsen developed severe abdominal pain in the lower part, bloating.  The pain later moved to the right side.  Courtney Nielsen noted a foul smell in the urine or with vaginally.  There was no significant vaginal discharge, however.  Courtney Nielsen did not sleep for two nights on Saturday and Sunday because of the pain and bloating.  There was no vomiting.  No dysuria. Pt saw her GYN the other day and exam was normal.  Urine test was repeated. Go to the ER if worse RLQ pain - r/o appendcitis Possible UTI -given Macrobid  PO empirically Abd CT      Relevant Orders   CT ABDOMEN PELVIS W CONTRAST   Well adult exam   We discussed age appropriate health related issues, including available/recomended screening tests and vaccinations. Labs were ordered to be later reviewed . All questions were answered. We discussed one or more of the following - seat belt use, use of sunscreen/sun exposure exercise, fall risk reduction, second hand smoke exposure, firearm use and storage, seat belt use, a need for adhering to healthy diet and exercise. Labs were ordered.  All questions were answered. CT angio March 2019 - no CAD, PE Colon 2018, 2023 Dr Aneita Lye just saw her gynecologist Flu shot given         Meds ordered this encounter  Medications   ALPRAZolam  (XANAX ) 0.5 MG tablet    Sig: TAKE (1) TABLET BY MOUTH AT BEDTIME.    Dispense:  90 tablet    Refill:  1   nitrofurantoin, macrocrystal-monohydrate, (MACROBID) 100 MG capsule    Sig: Take 1 capsule (100 mg total) by mouth 2 (two) times daily.    Dispense:  10 capsule    Refill:  0      Follow-up: Return in about 3 months (around 07/30/2024) for a follow-up visit.  Marolyn Noel, MD

## 2024-04-29 NOTE — Assessment & Plan Note (Signed)
 Chronic

## 2024-04-29 NOTE — Assessment & Plan Note (Signed)
Cont on Alprazolam prn ° Potential benefits of a long term benzodiazepines  use as well as potential risks  and complications were explained to the patient and were aknowledged. °

## 2024-05-22 DIAGNOSIS — M1611 Unilateral primary osteoarthritis, right hip: Secondary | ICD-10-CM | POA: Diagnosis not present

## 2024-05-26 ENCOUNTER — Telehealth: Payer: Self-pay | Admitting: Internal Medicine

## 2024-05-26 ENCOUNTER — Telehealth (HOSPITAL_BASED_OUTPATIENT_CLINIC_OR_DEPARTMENT_OTHER): Payer: Self-pay

## 2024-05-26 NOTE — Telephone Encounter (Signed)
 [facility] faxed document Surgical Clearance, to be filled out by provider. Patient requested to send it back via Fax within 7-days. Document is located in providers tray at front office.Please advise at Mobile [phone] (mobile)

## 2024-05-26 NOTE — Telephone Encounter (Signed)
 Pt has been scheduled in office appt for preop clearance . Pt is also due for her f/u with DR. Christopher. Pt would like to see MD if possible.    Pt has been scheduled 07/06/24 11:40. I will update all parties involved.

## 2024-05-26 NOTE — Telephone Encounter (Signed)
   Pre-operative Risk Assessment    Patient Name: Courtney Nielsen  DOB: 1956/01/17 MRN: [id number]   Date of last office visit: 01/15/2024 with Dr. Lonni Date of next office visit: None  Request for Surgical Clearance    Procedure:  Right total hip arthroplasty  Date of Surgery:  Clearance 08/04/24                                 Surgeon:  Dr. Dempsey Moan Surgeon's Group or Practice Name:  [facility]  Phone number:  [phone] Fax number:  [phone]   Type of Clearance Requested:   - Medical  - Pharmacy:  Hold Aspirin -Does not specify   Type of Anesthesia:  Choice   Additional requests/questions:  None  SignedPatrcia Iverson CROME   05/26/2024, 9:30 AM

## 2024-05-26 NOTE — Telephone Encounter (Signed)
   Name: AMISADAI WOODFORD  DOB: June 26, 1956  MRN: [id number]  Primary Cardiologist: Shelda Bruckner, MD  Chart reviewed as part of pre-operative protocol coverage. Because of Kerriann Kamphuis Donoghue's past medical history and time since last visit, she will require a follow-up in-office visit in order to better assess preoperative cardiovascular risk.  Pre-op covering staff: - Please schedule appointment and call patient to inform them. If patient already had an upcoming appointment within acceptable timeframe, please add pre-op clearance to the appointment notes so provider is aware. - Please contact requesting surgeon's office via preferred method (i.e, phone, fax) to inform them of need for appointment prior to surgery.  Yeslin Delio D Olsen Mccutchan, NP  05/26/2024, 9:59 AM

## 2024-05-27 ENCOUNTER — Other Ambulatory Visit: Payer: Self-pay

## 2024-06-02 ENCOUNTER — Other Ambulatory Visit: Payer: Self-pay | Admitting: Internal Medicine

## 2024-06-02 DIAGNOSIS — I10 Essential (primary) hypertension: Secondary | ICD-10-CM

## 2024-06-15 ENCOUNTER — Telehealth: Payer: Self-pay

## 2024-06-15 NOTE — Telephone Encounter (Signed)
 Copied from CRM #[id number]. Topic: Clinical - Lab/Test Results >> Jun 15, 2024  2:57 PM Geneva B wrote: Reason for CRM: pt is calling because she is have sx on 08/06/2023 and pt needs a bloodwork order please give pt a call   [phone]   [phone]  [phone]

## 2024-06-17 NOTE — Telephone Encounter (Signed)
 I was able to speak with the pt. Pts labs have been ordered for her to have done before her hip replacement surgery in Feb.

## 2024-06-17 NOTE — Addendum Note (Signed)
 Addended by: HEDDY IP R on: 06/17/2024 08:38 AM   Modules accepted: Orders

## 2024-06-29 ENCOUNTER — Other Ambulatory Visit: Payer: Self-pay

## 2024-06-29 ENCOUNTER — Other Ambulatory Visit (HOSPITAL_BASED_OUTPATIENT_CLINIC_OR_DEPARTMENT_OTHER): Payer: Self-pay

## 2024-07-06 ENCOUNTER — Ambulatory Visit (HOSPITAL_BASED_OUTPATIENT_CLINIC_OR_DEPARTMENT_OTHER): Admitting: Cardiology

## 2024-07-06 ENCOUNTER — Encounter (HOSPITAL_BASED_OUTPATIENT_CLINIC_OR_DEPARTMENT_OTHER): Payer: Self-pay | Admitting: Cardiology

## 2024-07-06 VITALS — BP 124/82 | HR 85 | Ht 70.0 in | Wt 237.8 lb

## 2024-07-06 DIAGNOSIS — Z01810 Encounter for preprocedural cardiovascular examination: Secondary | ICD-10-CM | POA: Diagnosis not present

## 2024-07-06 DIAGNOSIS — I10 Essential (primary) hypertension: Secondary | ICD-10-CM | POA: Diagnosis not present

## 2024-07-06 DIAGNOSIS — E66811 Obesity, class 1: Secondary | ICD-10-CM | POA: Diagnosis not present

## 2024-07-06 DIAGNOSIS — E7800 Pure hypercholesterolemia, unspecified: Secondary | ICD-10-CM | POA: Diagnosis not present

## 2024-07-06 DIAGNOSIS — I700 Atherosclerosis of aorta: Secondary | ICD-10-CM

## 2024-07-06 DIAGNOSIS — Z7189 Other specified counseling: Secondary | ICD-10-CM

## 2024-07-06 NOTE — Progress Notes (Signed)
 " Cardiology Office Note:  .    Date:  07/06/2024  ID:  Courtney Nielsen, DOB 11/06/1955, MRN [id number] PCP: Garald Karlynn GAILS, MD   [facility] Providers Cardiologist:  Shelda Bruckner, MD     History of Present Illness: .    Courtney Nielsen is a 69 y.o. female with a hx of obesity, COPD, hypertension, aortic atherosclerosis, hypercholesterolemia who is seen for follow up. I initially saw her 12/26/18 as a new consult at the request of Plotnikov, Karlynn GAILS, MD for the evaluation and management of shortness of breath, edema, and chest pain.   Cardiac history: Echocardiogram 12/24/18 showed EF 55-60%, grade 1 diastolic dysfunction. No significant valve disease. Labs showed Cr 1.03, nl TSH, largely nl CBC (borderline eosinophils), normal LFTs, normal D dimer, normal BNP of 12.   Did well with Wegovy . Starting weight 246 lbs in 2022, BMI 35 at that time. Had to stop due to cost.  Today: Planned for right total hip arthroplasty 08/04/24 with Dr. Hiram. Here for preop clearance today. See below. Has been in significant pain, hoping this will allow her to do more activities. Also hurt her back three years ago, has been walking with cane since that time. Has not had trouble with anesthesia except for her gallbladder surgery (feels like she got too much anesthesia). Did her functional capacity today, limited by orthopedic symptoms and not cardiac but can still do >4 mets.  Has occasional intermittent swelling in her legs, only when she is in the office (which she has not done since her orthopedic issues). Hasn't need bumex  since at least before Thanksgiving.  ROS: Denies chest pain, shortness of breath at rest or with normal exertion. No PND, orthopnea, or unexpected weight gain. No syncope or palpitations. ROS otherwise negative except as noted.   Studies Reviewed: SABRA    ECG: EKG Interpretation Date/Time:  Monday July 06 2024 11:59:29 EST Ventricular Rate:  85 PR  Interval:  166 QRS Duration:  80 QT Interval:  362 QTC Calculation: 430 R Axis:   11  Text Interpretation: Normal sinus rhythm Normal ECG When compared with ECG of 15-Jan-2024 09:00, No significant change was found Confirmed by Bruckner Shelda [phone]) on 07/06/2024 12:25:01 PM       Physical Exam:    VS:  BP 124/82   Pulse 85   Ht 5' 10 (1.778 m)   Wt 237 lb 12.8 oz (107.9 kg)   SpO2 96%   BMI 34.12 kg/m    Wt Readings from Last 3 Encounters:  07/06/24 237 lb 12.8 oz (107.9 kg)  04/29/24 234 lb 6.4 oz (106.3 kg)  01/15/24 227 lb (103 kg)    GEN: Well nourished, well developed in no acute distress HEENT: Normal, moist mucous membranes NECK: No JVD CARDIAC: regular rhythm, normal S1 and S2, no rubs or gallops. No murmur. VASCULAR: Radial and DP pulses 2+ bilaterally. No carotid bruits RESPIRATORY:  Clear to auscultation without rales, wheezing or rhonchi  ABDOMEN: Soft, non-tender, non-distended MUSCULOSKELETAL:  Ambulates independently with cane SKIN: Warm and dry, no edema NEUROLOGIC:  Alert and oriented x 3. No focal neuro deficits noted. PSYCHIATRIC:  Normal affect    ASSESSMENT AND PLAN: .    Preoperative cardiovascular evaluation According to the Revised Cardiac Risk Index (RCRI), her Perioperative Risk of Major Cardiac Event is (%): 0.4  Her Functional Capacity in METs is: 5.07 according to the Duke Activity Status Index (DASI).  The patient is not currently having active cardiac  symptoms, and they can achieve >4 METs of activity.  According to ACC/AHA Guidelines, no further testing is needed.  Proceed with surgery at acceptable risk.  Our service is available as needed in the peri-operative period.     Ok to hold aspirin for 5 days prior to surgery, restart when medically appropriate postoperatively.  Hypertension: goal <130/80.  -on irbesartan , continue   Obesity: BMI 35 with CV risk factors-->BMI 30 --> 34 today. Starting weight 246 lbs. -had to taper  wegovy  due to lack of coverage as well as some stomach bloating -height is 5 ft 10.5. Goal weight 180 lbs per patient. A weight of 175 would put her BMI under 25. -would consider re-start of GLP1 in the future if covered by her insurance; might try zepbound over wegovy  if available. This helped her very much in the past and given her joint issues/arthritis, weight loss may help her manage symptoms long    Hypercholesterolemia Aortic atherosclerosis (CT 2019) -tolerating rosuvastatin  -tolerating aspirin   Intermittent LE edema -PRN bumex   Cardiac risk counseling and prevention recommendations: -recommend heart healthy/Mediterranean diet, with whole grains, fruits, vegetable, fish, lean meats, nuts, and olive oil. Limit salt. -recommend moderate walking, 3-5 times/week for 30-50 minutes each session. Aim for at least 150 minutes.week. Goal should be pace of 3 miles/hours, or walking 1.5 miles in 30 minutes -recommend avoidance of tobacco products. Avoid excess alcohol.  Dispo: Follow-up in 6 months, or sooner as needed.  Signed, Shelda Bruckner, MD  Shelda Bruckner, MD, PhD, [facility]  [facility]  [facility] at [facility] at [facility] [address], [address] [place], [place] [address] [phone]   "

## 2024-07-06 NOTE — Patient Instructions (Signed)

## 2024-07-10 ENCOUNTER — Ambulatory Visit: Payer: Self-pay

## 2024-07-10 NOTE — Telephone Encounter (Signed)
 FYI Only or Action Required?: Action required by provider: request for appointment.  Patient was last seen in primary care on 04/29/2024 by Plotnikov, Karlynn GAILS, MD.  Called Nurse Triage reporting Chest Pain.  Symptoms began several days ago.  Interventions attempted: Rest, hydration, or home remedies.  Symptoms are: gradually worsening.  Triage Disposition: Go to ED Now (or PCP Triage)  Patient/caregiver understands and will follow disposition?: No, refuses disposition     Copied from CRM #[id number]. Topic: Clinical - Red Word Triage >> Jul 10, 2024  8:32 AM Courtney Nielsen wrote: Red Word that prompted transfer to Nurse Triage: Patient states her left side is in severe pain, cannot sleep at night. Last night felt like it was numb around the rib area. Began on Tuesday. It was painful in back as well. Reason for Disposition  Patient sounds very sick or weak to the triager  Answer Assessment - Initial Assessment Questions This RN recommended pt be examined in hospital, pt refusing. Advised pt call 911 or get to hospital asap if any new or worsening symptoms. Sending message to PCP office for call back to pt with further recommendations. Alerted [facility] to ED refusal.    Symptoms: Constant mild pain to left ribs near bra line and just under ribs at bottom At least 8/10 pain to touch or laying down Numbness at area last night Constipation Interrupted sleep Back pain - hear pop in back on 12/1, pain since  Denies: SOB Chest pain otherwise Burning or rash at area Worsening with deep breath Fever  Protocols used: Chest Pain-A-AH

## 2024-07-13 ENCOUNTER — Encounter: Payer: Self-pay | Admitting: Internal Medicine

## 2024-07-13 ENCOUNTER — Encounter: Payer: Self-pay | Admitting: Emergency Medicine

## 2024-07-13 ENCOUNTER — Ambulatory Visit

## 2024-07-13 ENCOUNTER — Ambulatory Visit: Payer: Self-pay | Admitting: Emergency Medicine

## 2024-07-13 ENCOUNTER — Ambulatory Visit: Admitting: Emergency Medicine

## 2024-07-13 ENCOUNTER — Other Ambulatory Visit (HOSPITAL_BASED_OUTPATIENT_CLINIC_OR_DEPARTMENT_OTHER): Payer: Self-pay

## 2024-07-13 ENCOUNTER — Telehealth: Payer: Self-pay

## 2024-07-13 VITALS — BP 138/84 | HR 73 | Temp 98.0°F | Resp 16 | Ht 70.0 in | Wt 236.0 lb

## 2024-07-13 DIAGNOSIS — M545 Low back pain, unspecified: Secondary | ICD-10-CM

## 2024-07-13 DIAGNOSIS — G8929 Other chronic pain: Secondary | ICD-10-CM

## 2024-07-13 DIAGNOSIS — Z01818 Encounter for other preprocedural examination: Secondary | ICD-10-CM

## 2024-07-13 DIAGNOSIS — R1012 Left upper quadrant pain: Secondary | ICD-10-CM

## 2024-07-13 LAB — CBC WITH DIFFERENTIAL/PLATELET
Basophils Absolute: 0.1 K/uL (ref 0.0–0.1)
Basophils Relative: 0.9 % (ref 0.0–3.0)
Eosinophils Absolute: 0.4 K/uL (ref 0.0–0.7)
Eosinophils Relative: 3.8 % (ref 0.0–5.0)
HCT: 41.6 % (ref 36.0–46.0)
Hemoglobin: 14 g/dL (ref 12.0–15.0)
Lymphocytes Relative: 27.9 % (ref 12.0–46.0)
Lymphs Abs: 2.6 K/uL (ref 0.7–4.0)
MCHC: 33.5 g/dL (ref 30.0–36.0)
MCV: 93.4 fl (ref 78.0–100.0)
Monocytes Absolute: 0.7 K/uL (ref 0.1–1.0)
Monocytes Relative: 7.5 % (ref 3.0–12.0)
Neutro Abs: 5.6 K/uL (ref 1.4–7.7)
Neutrophils Relative %: 59.9 % (ref 43.0–77.0)
Platelets: 270 K/uL (ref 150.0–400.0)
RBC: 4.46 Mil/uL (ref 3.87–5.11)
RDW: 13.8 % (ref 11.5–15.5)
WBC: 9.3 K/uL (ref 4.0–10.5)

## 2024-07-13 LAB — COMPREHENSIVE METABOLIC PANEL WITH GFR
ALT: 21 U/L (ref 3–35)
AST: 17 U/L (ref 5–37)
Albumin: 4 g/dL (ref 3.5–5.2)
Alkaline Phosphatase: 79 U/L (ref 39–117)
BUN: 10 mg/dL (ref 6–23)
CO2: 29 meq/L (ref 19–32)
Calcium: 9.1 mg/dL (ref 8.4–10.5)
Chloride: 105 meq/L (ref 96–112)
Creatinine, Ser: 1 mg/dL (ref 0.40–1.20)
GFR: 57.96 mL/min — ABNORMAL LOW
Glucose, Bld: 101 mg/dL — ABNORMAL HIGH (ref 70–99)
Potassium: 4.3 meq/L (ref 3.5–5.1)
Sodium: 141 meq/L (ref 135–145)
Total Bilirubin: 1.1 mg/dL (ref 0.2–1.2)
Total Protein: 7 g/dL (ref 6.0–8.3)

## 2024-07-13 MED ORDER — METHYLPREDNISOLONE 4 MG PO TBPK
ORAL_TABLET | ORAL | 0 refills | Status: AC
  Filled 2024-07-13: qty 21, 6d supply, fill #0

## 2024-07-13 NOTE — Telephone Encounter (Signed)
 Sent per previous discussion.

## 2024-07-13 NOTE — Assessment & Plan Note (Addendum)
 Pain after bending over earlier last month, no back pain red flags. We will get imaging today to rule out compression fracture If this is negative, could consider a Medrol  pack to calm down her back pain though as she has upcoming hip replacement surgery in about 3-1/2 weeks she is advised to contact orthopedist to confirm they would be okay with her taking this medication and if so we will let me know and I can get it sent in for her. Otherwise, a referral was placed as desired  Orders:   Ambulatory referral to Orthopedic Surgery   DG Lumbar Spine 2-3 Views; Future

## 2024-07-13 NOTE — Progress Notes (Signed)
 "   Assessment & Plan:   Assessment & Plan LUQ abdominal pain Greatly improved.  She does have some tenderness to palpation over the right abdomen, with similar pain pattern seen several months ago by primary care provider with unremarkable CT scan at that time.  No fevers, anorexia, nausea or vomiting-continue to monitor but no indication for testing or intervention today.  We discussed that should this discomfort become prominent or should any other symptoms develop I would recommend further evaluation.    Acute midline low back pain without sciatica Chronic midline low back pain without sciatica Pain after bending over earlier last month, no back pain red flags. We will get imaging today to rule out compression fracture If this is negative, could consider a Medrol  pack to calm down her back pain though as she has upcoming hip replacement surgery in about 3-1/2 weeks she is advised to contact orthopedist to confirm they would be okay with her taking this medication and if so we will let me know and I can get it sent in for her. Otherwise, a referral was placed as desired  Orders:   Ambulatory referral to Orthopedic Surgery   DG Lumbar Spine 2-3 Views; Future  Pre-op testing This was automatically added related to upcoming surgery, and was not dicussed/assessed by me today. Orders:   Comprehensive metabolic panel with GFR   CBC with Differential/Platelet     Corean Geralds, MSPAS, PA-C    Subjective:   Chief Complaint  Patient presents with   Abdominal Pain    Left side abdominal pain that started 6 days ago. Took stool softener and It has helped- pain is improving.    Back Pain    Left side back pain x 3 weeks. Reached down to lift clothes and heard a pop. Has reached out to her back doctor- has not heard back (Dr.Beane). Back was burning on Saturday night- no longer happening. Denies UTI symptoms.      HPI: Courtney Nielsen is a 69 y.o. female presenting on  07/13/2024 with report of left-sided abdominal pain and acute on chronic low back pain.  Abdominal pain started 6 days ago, no particular injury.  Radiated from the left upper abdomen and wraps around to the left side of the mid back.  Pain was worse to move or touch.  While she denies constipation per se, she does report a history of chronic diarrhea with typically 3-4 episodes per day and was decreased from her normal.  She denies at any time associated fever, cough, chest pain or shortness of breath, nausea or vomiting, dysuria or hematuria, or visible rash.  She did take a stool softener on Friday and Saturday and her pain is greatly improved with only some lingering numb discomfort over the left lateral abdomen. Also acute on chronic low back pain.  Early last month, while bending down to pick something up she felt a pop in the low back.  Since that time, it has been bothering her more than normal but she denies associated bowel or bladder incontinence, saddle anesthesia, or acute numbness or weakness of the extremities.  Previous orthopedist for back was [facility], hasn't seen them in years. Saw Dr Rosy, who has retired. Was referred to pain management, who was performing nerve blocks for her back pain but stopped them due to short duration of action. She would like a new evaluation with Dr duwayne, and requests a referral to get in with him.    ROS: Negative  unless specifically indicated above in HPI.   Relevant past medical history reviewed and updated as indicated.   Allergies and medications reviewed and updated.  Current Medications[1]  Allergies[2]  Social History[3]   Objective:   Vitals:   07/13/24 0855 07/13/24 0929  BP: (!) 144/88 138/84  Pulse: 73   Temp: 98 F (36.7 C)   Resp: 16   Height: 5' 10 (1.778 m)   Weight: 236 lb (107 kg)   SpO2: 98%   BMI (Calculated): 33.86      Appears well, in no acute distress Sclera anicteric Oral mucosa moist Heart regular rate  and rhythm Normal resp effort and excursion.  Abdomen is soft and there is some mild tenderness to the right hypogastric and lower quadrants without guarding or rebound.  There is no tenderness over area of reported discomfort over the left abdomen and no palpable organomegaly.  No CVA tenderness.  There is no acute rash, ecchymosis, or other skin findings over her reported area of discomfort. No tenderness over the midline spine.  Patient ambulates with a cane without ataxia.  No foot drop.     [1]  Current Outpatient Medications:    acetaminophen  (TYLENOL ) 500 MG tablet, Take 1,000 mg by mouth every 6 (six) hours as needed for moderate pain. , Disp: , Rfl:    ALPRAZolam  (XANAX ) 0.5 MG tablet, TAKE (1) TABLET BY MOUTH AT BEDTIME., Disp: 90 tablet, Rfl: 1   Ascorbic Acid (VITAMIN C) 1000 MG tablet, Take 1 tablet by mouth daily., Disp: , Rfl:    aspirin EC 81 MG tablet, Take 81 mg by mouth at bedtime. , Disp: , Rfl:    B Complex-C (SUPER B COMPLEX/VITAMIN C PO), Take 1 tablet by mouth daily., Disp: , Rfl:    bumetanide  (BUMEX ) 1 MG tablet, Take 1 tablet (1 mg total) by mouth daily as needed (Take as needed for swelling. If taking more than twice per week, please contact cardiology [phone])., Disp: 30 tablet, Rfl: 0   Cholecalciferol (VITAMIN D3) 50 MCG (2000 UT) TABS, Take 2,000 Units by mouth at bedtime., Disp: , Rfl:    irbesartan  (AVAPRO ) 300 MG tablet, TAKE 1 TABLET AT BEDTIME, Disp: 90 tablet, Rfl: 3   loratadine  (CLARITIN ) 10 MG tablet, Take 10 mg by mouth at bedtime., Disp: , Rfl:    Menthol , Topical Analgesic, (BLUE-EMU MAXIMUM STRENGTH EX), Apply 1 application topically daily., Disp: , Rfl:    naproxen  (NAPROSYN ) 500 MG tablet, TAKE 1 TABLET(500 MG) BY MOUTH DAILY AS NEEDED FOR MODERATE PAIN, Disp: 90 tablet, Rfl: 1   nitrofurantoin , macrocrystal-monohydrate, (MACROBID ) 100 MG capsule, Take 1 capsule (100 mg total) by mouth 2 (two) times daily., Disp: 10 capsule, Rfl: 0   Omega-3  Fatty Acids (FISH OIL) 500 MG CAPS, Take 500 mg by mouth at bedtime. , Disp: , Rfl:    rosuvastatin  (CRESTOR ) 5 MG tablet, Take 1 tablet (5 mg total) by mouth daily., Disp: 90 tablet, Rfl: 3   vitamin B-12 (CYANOCOBALAMIN) 1000 MCG tablet, Take 1,000 mcg by mouth at bedtime., Disp: , Rfl:  [2]  Allergies Allergen Reactions   Meloxicam  Swelling and Other (See Comments)    Leg swelling   Sulfa Antibiotics Nausea And Vomiting   Indomethacin     Ceftin  [Cefuroxime  Axetil] Itching and Other (See Comments)   Coreg  [Carvedilol ] Nausea Only   Dilaudid  [Hydromorphone  Hcl] Other (See Comments)    Burning of the face   Flagyl  [Metronidazole ] Itching   Furosemide Other (See Comments)  cramps   Levaquin  [Levofloxacin ] Other (See Comments)    Head itching   Prednisone  Itching and Other (See Comments)    Sometimes doesn't work per patient, but can tolerate   Sulfonamide Derivatives Nausea And Vomiting   Verapamil  Swelling  [3]  Social History Tobacco Use   Smoking status: Former    Current packs/day: 0.00    Average packs/day: 1 pack/day for 20.0 years (20.0 ttl pk-yrs)    Types: Cigarettes    Start date: 07/03/1991    Quit date: 07/03/2011    Years since quitting: 13.0    Passive exposure: Never   Smokeless tobacco: Never   Tobacco comments:    I have not smoked since October 2012  Vaping Use   Vaping status: Never Used  Substance Use Topics   Alcohol use: No   Drug use: No   "

## 2024-07-13 NOTE — Telephone Encounter (Signed)
 Copied from CRM [id number]. Topic: Clinical - Prescription Issue >> Jul 13, 2024  1:30 PM Aleatha C wrote: Reason for CRM: Patient called to let Corean Geralds know to send the medtrol pack that they discussed to [facility]. [facility] approved  her to use it.

## 2024-07-13 NOTE — Patient Instructions (Signed)
 As we discussed, your abdominal exam currently shows no sign of any concerning medical issue.  You did have some tenderness to the right lower abdomen and I recommend watching this and further testing should be performed if this increases or if you develop associated nausea, vomiting, appetite loss, or fevers. Additionally, I recommended an x-ray which you are going to get before you leave the building today.  We will let you know the results of this test.  You are also going to call your orthopedist office to ask if they would be okay with you taking a Medrol  steroid Dosepak.  This would be for 6 days so you would be done with the medication long before your surgery.

## 2024-07-19 ENCOUNTER — Ambulatory Visit: Payer: Self-pay | Admitting: Internal Medicine

## 2024-07-24 ENCOUNTER — Other Ambulatory Visit: Payer: Self-pay

## 2024-07-24 ENCOUNTER — Other Ambulatory Visit (HOSPITAL_BASED_OUTPATIENT_CLINIC_OR_DEPARTMENT_OTHER): Payer: Self-pay

## 2024-07-27 ENCOUNTER — Other Ambulatory Visit: Payer: Self-pay

## 2024-07-28 ENCOUNTER — Other Ambulatory Visit (HOSPITAL_BASED_OUTPATIENT_CLINIC_OR_DEPARTMENT_OTHER): Payer: Self-pay

## 2024-07-30 ENCOUNTER — Other Ambulatory Visit (HOSPITAL_BASED_OUTPATIENT_CLINIC_OR_DEPARTMENT_OTHER): Payer: Self-pay

## 2024-07-30 MED ORDER — ONDANSETRON HCL 4 MG PO TABS
4.0000 mg | ORAL_TABLET | Freq: Four times a day (QID) | ORAL | 0 refills | Status: AC | PRN
  Filled 2024-07-30: qty 20, 5d supply, fill #0

## 2024-07-30 MED ORDER — OXYCODONE HCL 5 MG PO TABS
5.0000 mg | ORAL_TABLET | ORAL | 0 refills | Status: AC | PRN
  Filled 2024-07-30: qty 42, 7d supply, fill #0

## 2024-07-30 MED ORDER — METHOCARBAMOL 500 MG PO TABS
500.0000 mg | ORAL_TABLET | Freq: Four times a day (QID) | ORAL | 0 refills | Status: AC | PRN
  Filled 2024-07-30: qty 40, 10d supply, fill #0

## 2024-07-30 MED ORDER — TRAMADOL HCL 50 MG PO TABS
50.0000 mg | ORAL_TABLET | ORAL | 0 refills | Status: AC | PRN
  Filled 2024-07-30: qty 40, 7d supply, fill #0

## 2024-08-05 ENCOUNTER — Ambulatory Visit: Admitting: Internal Medicine

## 2024-08-26 ENCOUNTER — Ambulatory Visit: Admitting: Internal Medicine
